# Patient Record
Sex: Female | Born: 1945 | Race: White | Hispanic: No | Smoking: Never smoker
Health system: Southern US, Community
[De-identification: ages and names within clinical notes are randomized; demographics above are authoritative.]

## PROBLEM LIST (undated history)

## (undated) DIAGNOSIS — R002 Palpitations: Principal | ICD-10-CM

## (undated) DIAGNOSIS — B029 Zoster without complications: Secondary | ICD-10-CM

## (undated) DIAGNOSIS — I499 Cardiac arrhythmia, unspecified: Secondary | ICD-10-CM

## (undated) DIAGNOSIS — E039 Hypothyroidism, unspecified: Secondary | ICD-10-CM

## (undated) DIAGNOSIS — A4902 Methicillin resistant Staphylococcus aureus infection, unspecified site: Secondary | ICD-10-CM

## (undated) DIAGNOSIS — F32A Depression, unspecified: Secondary | ICD-10-CM

## (undated) DIAGNOSIS — Q615 Medullary cystic kidney: Secondary | ICD-10-CM

## (undated) DIAGNOSIS — M8580 Other specified disorders of bone density and structure, unspecified site: Secondary | ICD-10-CM

## (undated) DIAGNOSIS — F419 Anxiety disorder, unspecified: Secondary | ICD-10-CM

## (undated) DIAGNOSIS — I4891 Unspecified atrial fibrillation: Secondary | ICD-10-CM

## (undated) DIAGNOSIS — F209 Schizophrenia, unspecified: Secondary | ICD-10-CM

## (undated) DIAGNOSIS — I059 Rheumatic mitral valve disease, unspecified: Secondary | ICD-10-CM

## (undated) DIAGNOSIS — I341 Nonrheumatic mitral (valve) prolapse: Secondary | ICD-10-CM

## (undated) DIAGNOSIS — J309 Allergic rhinitis, unspecified: Secondary | ICD-10-CM

## (undated) DIAGNOSIS — Z87442 Personal history of urinary calculi: Secondary | ICD-10-CM

## (undated) DIAGNOSIS — F329 Major depressive disorder, single episode, unspecified: Secondary | ICD-10-CM

## (undated) DIAGNOSIS — E119 Type 2 diabetes mellitus without complications: Secondary | ICD-10-CM

## (undated) DIAGNOSIS — N200 Calculus of kidney: Secondary | ICD-10-CM

## (undated) DIAGNOSIS — K8020 Calculus of gallbladder without cholecystitis without obstruction: Secondary | ICD-10-CM

## (undated) DIAGNOSIS — R011 Cardiac murmur, unspecified: Secondary | ICD-10-CM

## (undated) DIAGNOSIS — I10 Essential (primary) hypertension: Secondary | ICD-10-CM

## (undated) DIAGNOSIS — G709 Myoneural disorder, unspecified: Secondary | ICD-10-CM

## (undated) HISTORY — DX: Medullary cystic kidney: Q61.5

## (undated) HISTORY — DX: Unspecified atrial fibrillation: I48.91

## (undated) HISTORY — PX: NASAL SINUS SURGERY: SHX719

## (undated) HISTORY — DX: Zoster without complications: B02.9

## (undated) HISTORY — DX: Allergic rhinitis, unspecified: J30.9

## (undated) HISTORY — DX: Palpitations: R00.2

## (undated) HISTORY — DX: Other specified disorders of bone density and structure, unspecified site: M85.80

## (undated) HISTORY — DX: Type 2 diabetes mellitus without complications: E11.9

## (undated) HISTORY — DX: Calculus of gallbladder without cholecystitis without obstruction: K80.20

## (undated) HISTORY — PX: SHOULDER ARTHROSCOPY: SHX128

## (undated) HISTORY — PX: SHOULDER ARTHROSCOPY W/ ROTATOR CUFF REPAIR: SHX2400

## (undated) HISTORY — DX: Rheumatic mitral valve disease, unspecified: I05.9

## (undated) HISTORY — DX: Methicillin resistant Staphylococcus aureus infection, unspecified site: A49.02

## (undated) HISTORY — DX: Calculus of kidney: N20.0

## (undated) HISTORY — PX: ELBOW ARTHROSCOPY WITH TENDON RECONSTRUCTION: SHX5616

---

## 1989-01-06 HISTORY — PX: CYSTOSCOPY W/ STONE MANIPULATION: SHX1427

## 2002-05-08 HISTORY — PX: KNEE ARTHROSCOPY: SHX127

## 2005-05-08 HISTORY — PX: ELBOW ARTHROSCOPY: SHX614

## 2006-05-08 HISTORY — PX: ELBOW ARTHROSCOPY: SHX614

## 2007-06-26 ENCOUNTER — Other Ambulatory Visit: Admission: RE | Admit: 2007-06-26 | Discharge: 2007-06-26 | Payer: Self-pay | Admitting: Pediatrics

## 2007-07-25 ENCOUNTER — Encounter: Admission: RE | Admit: 2007-07-25 | Discharge: 2007-07-25 | Payer: Self-pay | Admitting: Family Medicine

## 2007-10-15 ENCOUNTER — Encounter: Admission: RE | Admit: 2007-10-15 | Discharge: 2007-10-15 | Payer: Self-pay | Admitting: Family Medicine

## 2007-10-16 ENCOUNTER — Encounter: Admission: RE | Admit: 2007-10-16 | Discharge: 2007-10-16 | Payer: Self-pay | Admitting: Family Medicine

## 2008-09-14 ENCOUNTER — Encounter: Admission: RE | Admit: 2008-09-14 | Discharge: 2008-09-14 | Payer: Self-pay | Admitting: Family Medicine

## 2009-02-05 HISTORY — PX: KNEE ARTHROSCOPY: SHX127

## 2009-02-24 ENCOUNTER — Ambulatory Visit (HOSPITAL_COMMUNITY): Admission: RE | Admit: 2009-02-24 | Discharge: 2009-02-25 | Payer: Self-pay | Admitting: Orthopedic Surgery

## 2009-02-24 ENCOUNTER — Encounter (INDEPENDENT_AMBULATORY_CARE_PROVIDER_SITE_OTHER): Payer: Self-pay | Admitting: Specialist

## 2009-09-28 ENCOUNTER — Encounter: Admission: RE | Admit: 2009-09-28 | Discharge: 2009-09-28 | Payer: Self-pay | Admitting: Family Medicine

## 2010-08-11 LAB — CBC
Hemoglobin: 15.5 g/dL — ABNORMAL HIGH (ref 12.0–15.0)
MCV: 91.7 fL (ref 78.0–100.0)
RDW: 13 % (ref 11.5–15.5)
WBC: 5 10*3/uL (ref 4.0–10.5)

## 2010-08-11 LAB — BASIC METABOLIC PANEL
BUN: 12 mg/dL (ref 6–23)
Calcium: 9.8 mg/dL (ref 8.4–10.5)
Creatinine, Ser: 0.64 mg/dL (ref 0.4–1.2)
GFR calc Af Amer: >60 mL/min (ref >60)
GFR calc non Af Amer: >60 mL/min (ref >60)
Glucose, Bld: 99 mg/dL (ref 70–99)
Potassium: 3.5 mEq/L (ref 3.5–5.1)

## 2010-10-17 ENCOUNTER — Other Ambulatory Visit: Payer: Self-pay | Admitting: Family Medicine

## 2010-10-17 DIAGNOSIS — Z1231 Encounter for screening mammogram for malignant neoplasm of breast: Secondary | ICD-10-CM

## 2010-10-26 ENCOUNTER — Ambulatory Visit
Admission: RE | Admit: 2010-10-26 | Discharge: 2010-10-26 | Disposition: A | Discharge status: Home / Self Care | Payer: 59 | Source: Ambulatory Visit | Attending: Family Medicine | Admitting: Family Medicine

## 2010-10-26 DIAGNOSIS — Z1231 Encounter for screening mammogram for malignant neoplasm of breast: Secondary | ICD-10-CM

## 2010-11-01 ENCOUNTER — Other Ambulatory Visit (HOSPITAL_COMMUNITY)
Admission: RE | Admit: 2010-11-01 | Discharge: 2010-11-01 | Disposition: A | Discharge status: Home / Self Care | Payer: 59 | Source: Ambulatory Visit | Attending: Family Medicine | Admitting: Family Medicine

## 2010-11-01 ENCOUNTER — Other Ambulatory Visit: Payer: Self-pay | Admitting: Physician Assistant

## 2010-11-01 DIAGNOSIS — Z01419 Encounter for gynecological examination (general) (routine) without abnormal findings: Secondary | ICD-10-CM | POA: Insufficient documentation

## 2011-06-28 ENCOUNTER — Other Ambulatory Visit: Payer: Self-pay | Admitting: Family Medicine

## 2011-06-28 ENCOUNTER — Ambulatory Visit
Admission: RE | Admit: 2011-06-28 | Discharge: 2011-06-28 | Disposition: A | Discharge status: Home / Self Care | Payer: PRIVATE HEALTH INSURANCE | Source: Ambulatory Visit | Attending: Family Medicine | Admitting: Family Medicine

## 2011-06-28 DIAGNOSIS — H9190 Unspecified hearing loss, unspecified ear: Secondary | ICD-10-CM

## 2011-06-28 MED ORDER — GADOBENATE DIMEGLUMINE 529 MG/ML IV SOLN
13.0000 mL | Freq: Once | INTRAVENOUS | Status: AC | PRN
  Administered 2011-06-28: 13 mL via INTRAVENOUS

## 2011-07-26 ENCOUNTER — Other Ambulatory Visit: Payer: Self-pay

## 2011-07-26 ENCOUNTER — Observation Stay (HOSPITAL_COMMUNITY)
Admission: EM | Admit: 2011-07-26 | Discharge: 2011-07-28 | Disposition: A | Discharge status: Home / Self Care | Payer: PRIVATE HEALTH INSURANCE | Attending: Emergency Medicine | Admitting: Emergency Medicine

## 2011-07-26 ENCOUNTER — Emergency Department (HOSPITAL_COMMUNITY): Payer: PRIVATE HEALTH INSURANCE

## 2011-07-26 ENCOUNTER — Encounter (HOSPITAL_COMMUNITY): Payer: Self-pay | Admitting: Emergency Medicine

## 2011-07-26 DIAGNOSIS — R4182 Altered mental status, unspecified: Secondary | ICD-10-CM | POA: Insufficient documentation

## 2011-07-26 DIAGNOSIS — F22 Delusional disorders: Secondary | ICD-10-CM

## 2011-07-26 DIAGNOSIS — I059 Rheumatic mitral valve disease, unspecified: Secondary | ICD-10-CM | POA: Insufficient documentation

## 2011-07-26 DIAGNOSIS — F411 Generalized anxiety disorder: Secondary | ICD-10-CM | POA: Insufficient documentation

## 2011-07-26 DIAGNOSIS — F419 Anxiety disorder, unspecified: Secondary | ICD-10-CM

## 2011-07-26 DIAGNOSIS — R079 Chest pain, unspecified: Principal | ICD-10-CM | POA: Insufficient documentation

## 2011-07-26 DIAGNOSIS — R0602 Shortness of breath: Secondary | ICD-10-CM | POA: Insufficient documentation

## 2011-07-26 HISTORY — DX: Nonrheumatic mitral (valve) prolapse: I34.1

## 2011-07-26 HISTORY — DX: Anxiety disorder, unspecified: F41.9

## 2011-07-26 LAB — POCT I-STAT TROPONIN I: Troponin i, poc: 0.01 ng/mL (ref 0.00–0.08)

## 2011-07-26 LAB — COMPREHENSIVE METABOLIC PANEL
ALT: 26 U/L (ref 0–35)
Alkaline Phosphatase: 84 U/L (ref 39–117)
CO2: 29 mEq/L (ref 19–32)
Chloride: 96 mEq/L (ref 96–112)
Creatinine, Ser: 0.68 mg/dL (ref 0.50–1.10)
Potassium: 3.2 mEq/L — ABNORMAL LOW (ref 3.5–5.1)
Total Bilirubin: 0.5 mg/dL (ref 0.3–1.2)

## 2011-07-26 LAB — CBC
MCH: 31.6 pg (ref 26.0–34.0)
MCV: 87 fL (ref 78.0–100.0)
RDW: 12.6 % (ref 11.5–15.5)

## 2011-07-26 MED ORDER — ALPRAZOLAM 0.5 MG PO TABS
0.2500 mg | ORAL_TABLET | Freq: Every day | ORAL | Status: DC
  Administered 2011-07-27: 0.25 mg via ORAL
  Filled 2011-07-26: qty 2
  Filled 2011-07-26: qty 1

## 2011-07-26 MED ORDER — PROPRANOLOL HCL ER 80 MG PO CP24
80.0000 mg | ORAL_CAPSULE | ORAL | Status: AC
  Administered 2011-07-27: 80 mg via ORAL
  Filled 2011-07-26: qty 1

## 2011-07-26 MED ORDER — ALPRAZOLAM 0.5 MG PO TABS: 0.2500 mg | ORAL_TABLET | Freq: Once | ORAL | Status: DC

## 2011-07-26 NOTE — ED Provider Notes (Signed)
History     CSN: [id number]  Arrival date & time 07/26/11  1909   First MD Initiated Contact with Patient 07/26/11 2027      Chief Complaint  Patient presents with  . Chest Pain    (Consider location/radiation/quality/duration/timing/severity/associated sxs/prior treatment) HPI Pt with cp onset last night, ss pressure-like, nonradiating, last a few minutes, no aggravating or alleviating factors, some mild associated sob.  Has never had cp like this in past.    Past Medical History  Diagnosis Date  . Mitral valve prolapse   . Anxiety     No past surgical history on file.  No family history on file.  History  Substance Use Topics  . Smoking status: Not on file  . Smokeless tobacco: Not on file  . Alcohol Use:     OB History    Grav Para Term Preterm Abortions TAB SAB Ect Mult Living                  Review of Systems  Constitutional: Negative for fever and chills.  Respiratory: Positive for shortness of breath. Negative for cough.   Cardiovascular: Positive for chest pain.  Gastrointestinal: Negative for nausea and vomiting.  Musculoskeletal: Negative for back pain.  All other systems reviewed and are negative.    Allergies  Sulfa antibiotics  Home Medications   Current Outpatient Rx  Name Route Sig Dispense Refill  . ALPRAZOLAM 0.25 MG PO TABS Oral Take 0.25 mg by mouth at bedtime as needed. For insomnia/anxiety    . ASPIRIN EC 81 MG PO TBEC Oral Take 81 mg by mouth daily.    Marland Kitchen DIGOXIN 0.25 MG PO TABS Oral Take 0.25-0.5 mg by mouth daily. Take one tablet daily alternate with one half tab    . ESCITALOPRAM OXALATE 5 MG PO TABS Oral Take 10 mg by mouth daily.    Marland Kitchen PROPRANOLOL HCL 40 MG PO TABS Oral Take 40 mg by mouth 4 (four) times daily.    . TRIAMTERENE-HCTZ 37.5-25 MG PO CAPS Oral Take 1 capsule by mouth every morning.      BP 128/43  Pulse 67  Temp(Src) 98.6 F (37 C) (Oral)  Resp 19  SpO2 100%  Physical Exam  Nursing note and vitals  reviewed. Constitutional: She appears well-developed and well-nourished.  HENT:  Head: Normocephalic and atraumatic.  Eyes: Right eye exhibits no discharge. Left eye exhibits no discharge.  Neck: Normal range of motion. Neck supple.  Cardiovascular: Normal rate, regular rhythm and normal heart sounds.   Pulmonary/Chest: Effort normal and breath sounds normal. She exhibits no tenderness.  Abdominal: Soft. There is no tenderness.  Musculoskeletal: She exhibits no edema and no tenderness.  Neurological: She is alert. GCS eye subscore is 4. GCS verbal subscore is 5. GCS motor subscore is 6.  Skin: Skin is warm and dry.  Psychiatric: She has a normal mood and affect. Her behavior is normal.    ED Course  Procedures (including critical care time)  Labs Reviewed  CBC - Abnormal; Notable for the following:    RBC 5.31 (*)    Hemoglobin 16.8 (*)    HCT 46.2 (*)    MCHC 36.4 (*)    All other components within normal limits  COMPREHENSIVE METABOLIC PANEL - Abnormal; Notable for the following:    Potassium 3.2 (*)    Glucose, Bld 120 (*)    All other components within normal limits  POCT I-STAT TROPONIN I  POCT I-STAT TROPONIN I  Dg Chest 2 View  07/26/2011  *RADIOLOGY REPORT*  Clinical Data: Chest pain, shortness of breath  CHEST - 2 VIEW  Comparison: 02/22/2009  Findings: Normal heart size, mediastinal contours, and pulmonary vascularity. Lungs hyperexpanded but clear. No pleural effusion or pneumothorax. Bones diffusely demineralized without acute abnormality.  IMPRESSION: Hyperexpanded lungs without acute infiltrate.  Original Report Authenticated By: Lollie Marrow, M.D.     1. Chest pain      EKG: unchanged from previous tracings, normal sinus rhythm, nonspecific ST and T waves changes.  MDM  Pt is in nad, afvss, nontoxic appearing, exam and hx as above. acs considered, timi score 1 for multiple episodes, no h/o cad or risk factors, trop wnl, ekg nl, pt placed in cdu for low risk  obs with stress test.  Wells score is 0 doubt PE, doubt dissection, doubt esophageal injury. No infectious sx's, no opacity on cxr, doubt pna.  No masses or ptx on cxr.           Elijio Miles, MD 07/26/11 2325  Elijio Miles, MD 07/26/11 [phone]

## 2011-07-26 NOTE — ED Notes (Signed)
Per ems- pt coming from church where she was having chest pressure on right side to sternum 6/10 with shortness of breath.  Pt had 324 asa pta.  Pt states pain is now 4/10.  No nausea or vomiting. Ems state pt stopped taking antidepressant as of today

## 2011-07-26 NOTE — ED Notes (Signed)
EKG GIVEN TO DR KNAPP

## 2011-07-26 NOTE — ED Notes (Signed)
Unable to move pt to CDU b/c charge rn may close cdu.  Await status update.

## 2011-07-26 NOTE — ED Notes (Signed)
Pt denies pain but states that her heart is pounding.  HR  Of 68 nsr.  Pt notified of this.  Family pulled me aside and stated that has been having psychological "problems" for 2 months.  She is seeing a psychiatrist and psychologist (she recently had mri as well).  Per family, all other med labs/tests have been neg.  She has been hearing things "happening" with her neighbors up and down stairs that no one else in the family has heard.  She thinks they are saying to turn off the light, molesting their children, etc. She acts on these conversations, turning off the lights and calling 911.  Per husband, wife has also been hypersexual.  Dr Jamas Lav notified that family is concerned for pt mental health.

## 2011-07-26 NOTE — ED Provider Notes (Signed)
Patient relates she's been having a chest discomfort off and on since yesterday. She states it lasts about 30 seconds and it is a burning sensation she points to an area around the xiphoid upper epigastric region as where the pain originates. She denies any radiation up into her throat. She states when she has it she feels like her heart is pounding it makes her feel short of breath. She states that she standing it feels better she sits down but it can also happen if she sits down. Nothing she does makes the pain come on.   Patient has a regular heartbeat when I listen to her, there is no murmurs gallops or rubs. She has some tenderness at the xiphoid which is where she states her pain is located.     I saw and evaluated the patient, reviewed the resident's note and I agree with the findings and plan and EKG interpretation. Devoria Albe, MD, Armando Gang   Ward Givens, MD 07/28/11 [phone]

## 2011-07-27 ENCOUNTER — Encounter (HOSPITAL_COMMUNITY): Payer: Self-pay | Admitting: *Deleted

## 2011-07-27 ENCOUNTER — Other Ambulatory Visit: Payer: Self-pay

## 2011-07-27 ENCOUNTER — Observation Stay (HOSPITAL_COMMUNITY): Payer: PRIVATE HEALTH INSURANCE

## 2011-07-27 LAB — URINE MICROSCOPIC-ADD ON

## 2011-07-27 LAB — RAPID URINE DRUG SCREEN, HOSP PERFORMED
Barbiturates: NOT DETECTED
Benzodiazepines: NOT DETECTED
Cocaine: NOT DETECTED
Opiates: NOT DETECTED

## 2011-07-27 LAB — URINALYSIS, ROUTINE W REFLEX MICROSCOPIC
Bilirubin Urine: NEGATIVE
Glucose, UA: NEGATIVE mg/dL
Ketones, ur: >80 mg/dL — AB
Nitrite: NEGATIVE
Specific Gravity, Urine: 1.021 (ref 1.005–1.030)
pH: 6.5 (ref 5.0–8.0)

## 2011-07-27 LAB — POCT I-STAT TROPONIN I: Troponin i, poc: 0.01 ng/mL (ref 0.00–0.08)

## 2011-07-27 MED ORDER — LORAZEPAM 1 MG PO TABS: 1.0000 mg | ORAL_TABLET | Freq: Three times a day (TID) | ORAL | Status: DC | PRN

## 2011-07-27 MED ORDER — ONDANSETRON HCL 8 MG PO TABS: 4.0000 mg | ORAL_TABLET | Freq: Three times a day (TID) | ORAL | Status: DC | PRN

## 2011-07-27 MED ORDER — ZOLPIDEM TARTRATE 5 MG PO TABS
5.0000 mg | ORAL_TABLET | Freq: Every evening | ORAL | Status: DC | PRN
  Filled 2011-07-27: qty 1

## 2011-07-27 MED ORDER — ACETAMINOPHEN 325 MG PO TABS: 650.0000 mg | ORAL_TABLET | ORAL | Status: DC | PRN

## 2011-07-27 MED ORDER — NITROGLYCERIN 0.4 MG SL SUBL
0.4000 mg | SUBLINGUAL_TABLET | Freq: Once | SUBLINGUAL | Status: AC
  Administered 2011-07-27: 0.4 mg via SUBLINGUAL

## 2011-07-27 MED ORDER — TRIAMTERENE-HCTZ 37.5-25 MG PO CAPS
1.0000 | ORAL_CAPSULE | Freq: Every day | ORAL | Status: DC
  Administered 2011-07-28: 1 via ORAL
  Filled 2011-07-27: qty 1

## 2011-07-27 MED ORDER — ESCITALOPRAM OXALATE 10 MG PO TABS
10.0000 mg | ORAL_TABLET | Freq: Every day | ORAL | Status: DC
  Administered 2011-07-27 – 2011-07-28 (×2): 10 mg via ORAL
  Filled 2011-07-27 (×2): qty 1

## 2011-07-27 MED ORDER — LORAZEPAM 2 MG/ML IJ SOLN
1.0000 mg | Freq: Once | INTRAMUSCULAR | Status: AC
  Administered 2011-07-27: 1 mg via INTRAVENOUS

## 2011-07-27 MED ORDER — ASPIRIN EC 81 MG PO TBEC
81.0000 mg | DELAYED_RELEASE_TABLET | Freq: Every day | ORAL | Status: DC
  Administered 2011-07-27 – 2011-07-28 (×2): 81 mg via ORAL
  Filled 2011-07-27: qty 1

## 2011-07-27 MED ORDER — PROPRANOLOL HCL 40 MG PO TABS
40.0000 mg | ORAL_TABLET | Freq: Four times a day (QID) | ORAL | Status: DC
  Administered 2011-07-27 – 2011-07-28 (×3): 40 mg via ORAL
  Filled 2011-07-27 (×3): qty 1

## 2011-07-27 MED ORDER — DIGOXIN 125 MCG PO TABS
0.2500 mg | ORAL_TABLET | Freq: Every day | ORAL | Status: DC
  Administered 2011-07-27 – 2011-07-28 (×2): 0.25 mg via ORAL
  Filled 2011-07-27: qty 2
  Filled 2011-07-27: qty 1

## 2011-07-27 MED ORDER — IBUPROFEN 200 MG PO TABS: 600.0000 mg | ORAL_TABLET | Freq: Three times a day (TID) | ORAL | Status: DC | PRN

## 2011-07-27 MED ORDER — LORAZEPAM 2 MG/ML IJ SOLN
INTRAMUSCULAR | Status: AC
  Filled 2011-07-27: qty 1

## 2011-07-27 MED ORDER — IOHEXOL 350 MG/ML SOLN
80.0000 mL | Freq: Once | INTRAVENOUS | Status: AC | PRN
  Administered 2011-07-27: 80 mL via INTRAVENOUS

## 2011-07-27 MED ORDER — METOPROLOL TARTRATE 25 MG PO TABS
50.0000 mg | ORAL_TABLET | Freq: Once | ORAL | Status: AC
  Administered 2011-07-27: 50 mg via ORAL
  Filled 2011-07-27: qty 2

## 2011-07-27 MED ORDER — METOPROLOL TARTRATE 1 MG/ML IV SOLN
5.0000 mg | Freq: Once | INTRAVENOUS | Status: AC
  Administered 2011-07-27: 5 mg via INTRAVENOUS

## 2011-07-27 NOTE — BH Assessment (Signed)
Assessment Note   Deanna Shea is an 66 y.o. female that was referred to [facility] by her husband due to bizarre behavior.  Pt's spouse reported that since January, pt has been reporting that she could hear her upstairs neighbor through a vent and that he (the neighbor) was raping and molest his daughter.  Pt called DSS, and this was denied by the neighbor.  Pt also engaging in hypereligiousity, stating God is telling her things of this nature as well as 7 voices in her head or a "group of them tell me this," per the pt.  Pt and spouse then changed apartments within the same complex hoping this would rectify the problem, and pt most recently has reported that the downstairs neighbors are connected with the previous neighbor and want to shoot her.  Pt has also reported having meetings set up with the neighbors and apartment complex manager that did not exist.  Pt also reportedly believes a bug has been set up in her apartment and that these neighbors are listening to and plotting against her.  Pt's spouse has been so distraught by pt's current behavior, that he has offered to go to a hotel to get away from the apartment.  Pt reportedly has not been sleeping more than 3 hrs per night and has lost 15 lbs since January 2013.  Pt has also reported having anxiety, thought she was having irregular heart beat and went to Urgent Care, where all of her scans and labs are normal.  Pt has no previous mental health history and pt reports that this behavior is out of character for her and that he is worried.  Pt began seeing a psychiatrist and psychologist earlier this month and has been prescribed Xanax for anxiety as well as an unknown antidepressant and sleeping medication.  Pt denies SI/HI or visual hallucinations.  She does state that the voices tell her to do things and "have [place] running around."  Pt was drowsy during the session, but was pleasant and cooperative.  Pt is motivated for treatment.  Completed assessment,  assessment notification and faxed to [facility] to run for possible admission.  Updated ED staff.  Axis I: Psychotic Disorder NOS Axis II: Deferred Axis III:  Past Medical History  Diagnosis Date  . Mitral valve prolapse   . Anxiety    Axis IV: denies current stressors Axis V: 21-30 behavior considerably influenced by delusions or hallucinations OR serious impairment in judgment, communication OR inability to function in almost all areas  Past Medical History:  Past Medical History  Diagnosis Date  . Mitral valve prolapse   . Anxiety     History reviewed. No pertinent past surgical history.  Family History: No family history on file.  Social History:  does not have a smoking history on file. She does not have any smokeless tobacco history on file. She reports that she does not drink alcohol or use illicit drugs.  Additional Social History:  Alcohol / Drug Use Pain Medications: none Prescriptions: see list Over the Counter: none History of alcohol / drug use?: No history of alcohol / drug abuse Longest period of sobriety (when/how long): na Negative Consequences of Use:  (na) Withdrawal Symptoms:  (na) Allergies:  Allergies  Allergen Reactions  . Sulfa Antibiotics Other (See Comments)    Sores in mouth    Home Medications:  Medications Prior to Admission  Medication Dose Route Frequency Provider Last Rate Last Dose  . acetaminophen (TYLENOL) tablet 650 mg  650  mg Oral Q4H PRN Rise Patience, PA      . ALPRAZolam Prudy Feeler) tablet 0.25 mg  0.25 mg Oral QHS Elijio Miles, MD      . ibuprofen (ADVIL,MOTRIN) tablet 600 mg  600 mg Oral Q8H PRN Rise Patience, PA      . iohexol (OMNIPAQUE) 350 MG/ML injection 80 mL  80 mL Intravenous Once PRN Medication Radiologist, MD   80 mL at 07/27/11 1120  . LORazepam (ATIVAN) injection 1 mg  1 mg Intravenous Once Rise Patience, PA   1 mg at 07/27/11 4540  . LORazepam (ATIVAN) tablet 1 mg  1 mg Oral Q8H PRN Rise Patience, PA      . metoprolol  (LOPRESSOR) injection 5 mg  5 mg Intravenous Once Trudie Reed, MD   5 mg at 07/27/11 1043  . metoprolol tartrate (LOPRESSOR) tablet 50 mg  50 mg Oral Once Rise Patience, PA   50 mg at 07/27/11 9811  . nitroGLYCERIN (NITROSTAT) SL tablet 0.4 mg  0.4 mg Sublingual Once Trudie Reed, MD   0.4 mg at 07/27/11 1105  . ondansetron (ZOFRAN) tablet 4 mg  4 mg Oral Q8H PRN Rise Patience, PA      . propranolol (INDERAL LA) 24 hr capsule 80 mg  80 mg Oral To Major Elijio Miles, MD   80 mg at 07/27/11 0007  . zolpidem (AMBIEN) tablet 5 mg  5 mg Oral QHS PRN Rise Patience, PA      . DISCONTD: ALPRAZolam Prudy Feeler) tablet 0.25 mg  0.25 mg Oral Once Elijio Miles, MD       No current outpatient prescriptions on file as of 07/26/2011.    OB/GYN Status:  No LMP recorded. Patient is postmenopausal.  General Assessment Data Location of Assessment: [facility] ED Living Arrangements: Spouse/significant other Can pt return to current living arrangement?: Yes Admission Status: Voluntary Is patient capable of signing voluntary admission?: Yes Transfer from: Acute Hospital Referral Source: Self/Family/Friend  Education Status Is patient currently in school?: No Current Grade: na Highest grade of school patient has completed: unknown Name of school: na Contact person: na  Risk to self Suicidal Ideation: No-Not Currently/Within Last 6 Months Suicidal Intent: No-Not Currently/Within Last 6 Months Is patient at risk for suicide?: No Suicidal Plan?: No-Not Currently/Within Last 6 Months Access to Means: No What has been your use of drugs/alcohol within the last 12 months?: na Previous Attempts/Gestures: No How many times?: 0  Other Self Harm Risks: pt denies Triggers for Past Attempts: Other (Comment) (na) Intentional Self Injurious Behavior: None Family Suicide History: No Recent stressful life event(s): Other (Comment) (pt denies current stressors) Persecutory voices/beliefs?: No Depression: Yes Depression  Symptoms: Despondent;Insomnia;Fatigue Substance abuse history and/or treatment for substance abuse?: No Suicide prevention information given to non-admitted patients: Not applicable  Risk to Others Homicidal Ideation: No-Not Currently/Within Last 6 Months Thoughts of Harm to Others: No-Not Currently Present/Within Last 6 Months Current Homicidal Intent: No-Not Currently/Within Last 6 Months Current Homicidal Plan: No-Not Currently/Within Last 6 Months Access to Homicidal Means: No Identified Victim: na History of harm to others?: No Assessment of Violence: None Noted Violent Behavior Description: na - pt calm, cooperative Does patient have access to weapons?: No Criminal Charges Pending?: No Does patient have a court date: No  Psychosis Hallucinations: Auditory;With command (pt hears voices telling her to do things) Delusions:  (paranoid delusions, hyperreligiousity)  Mental Status Report Appear/Hygiene: Other (Comment) (casual, neat) Eye Contact: Poor Motor Activity: Unremarkable  Speech: Logical/coherent;Soft Level of Consciousness: Quiet/awake Mood: Depressed;Anxious Affect: Depressed;Anxious Anxiety Level: Panic Attacks Panic attack frequency: varies Most recent panic attack: Monday of this week Thought Processes: Coherent Judgement: Impaired Orientation: Person;Place;Time;Situation Obsessive Compulsive Thoughts/Behaviors: None  Cognitive Functioning Concentration: Decreased Memory: Recent Intact;Remote Intact IQ: Average Insight: Poor Impulse Control: Fair Appetite: Poor Weight Loss: 15  (since January 2013) Weight Gain: 0  Sleep: Decreased Total Hours of Sleep: 3  Vegetative Symptoms: None  Prior Inpatient Therapy Prior Inpatient Therapy: No Prior Therapy Dates: na Prior Therapy Facilty/Provider(s): na Reason for Treatment: na  Prior Outpatient Therapy Prior Outpatient Therapy: Yes Prior Therapy Dates: March 2012 - current Prior Therapy  Facilty/Provider(s): Fredric Mare - psychiatrist, Colen Darling - psychologist Reason for Treatment: Depression, anxiety  ADL Screening (condition at time of admission) Patient's cognitive ability adequate to safely complete daily activities?: Yes Patient able to express need for assistance with ADLs?: Yes Independently performs ADLs?: Yes  Home Assistive Devices/Equipment Home Assistive Devices/Equipment: None    Abuse/Neglect Assessment (Assessment to be complete while patient is alone) Physical Abuse: Denies Verbal Abuse: Denies Sexual Abuse: Denies Exploitation of patient/patient's resources: Denies Self-Neglect: Denies Values / Beliefs Cultural Requests During Hospitalization: None Spiritual Requests During Hospitalization: None Consults Spiritual Care Consult Needed: No Social Work Consult Needed: No Merchant navy officer (For Healthcare) Advance Directive: Patient does not have advance directive;Patient would not like information    Additional Information 1:1 In Past 12 Months?: No CIRT Risk: No Elopement Risk: No Does patient have medical clearance?: Yes     Disposition:  Disposition Disposition of Patient: Referred to;Inpatient treatment program Type of inpatient treatment program: Adult Patient referred to: Other (Comment)  On Site Evaluation by:   Reviewed with Physician:     Caryl Comes 07/27/2011 2:37 PM

## 2011-07-27 NOTE — ED Notes (Signed)
Returned from ct 

## 2011-07-27 NOTE — ED Provider Notes (Signed)
Date: 07/27/2011  Rate: 60  Rhythm: normal sinus rhythm  QRS Axis: normal  Intervals: PR prolonged  ST/T Wave abnormalities: normal  Conduction Disutrbances:first-degree A-V block   Narrative Interpretation:   Old EKG Reviewed: unchanged    Dayton Bailiff, MD 07/27/11 [phone]

## 2011-07-27 NOTE — ED Notes (Signed)
Pt now states anxiety is passing. Declined medication.

## 2011-07-27 NOTE — ED Notes (Signed)
Pt denies auditory or visual disturbances. Denies anxiety. Denies chest pressure or pain. Flat affect remains.

## 2011-07-27 NOTE — ED Provider Notes (Signed)
Patient in CDU awaiting completion of ACT evaluation and placement.  Patient originally on chest pain protocol--completed protocol without significant findings.  Patient with reported episodes of delusion and paranoia.  Patient currently resting comfortably with family at bedside.  States she does not feel anxious.  Patient alert and oriented.  Lungs CTA bilaterally.  S1/S2, RRR, no murmur. Abdomen soft, bowel sounds present.  Patient has been seen by ACT, and they are working on placement location.  12:19 AM Patient still awaiting placement.  Report provided to Dr. Lynelle Doctor.  Jimmye Norman, NP 07/28/11 0020

## 2011-07-27 NOTE — ED Notes (Signed)
Pt responsive to ativan. States chest pressure significantly decreased. Denies sob. Pt to now have ct coronary.

## 2011-07-27 NOTE — ED Notes (Signed)
Pt transported to ct for coronary scan 

## 2011-07-27 NOTE — ED Notes (Signed)
Diet tray ordered for pt 

## 2011-07-27 NOTE — ED Provider Notes (Signed)
7:59 AM Patient is in CDU under observation, chest pain, protocol.  Patient reports she continues to have 8/10 chest pressure.  Husband notes that patient has been having difficulties over the past several weeks of a psychiatric nature.  States that initially patient thought she was hearing voices of the apartment above and was convinced that the parent was molesting a child, then called social services who cleared up the matter.  Patient then began to believe that the family and the apartment was "out to get her" husband states that he and the patient then moved apartments and patient began believing that people in the apartment below her were knew the people from the other apartment and were out to get her as well.  Husband states that this all became much worse four days ago - states that the patient began having rapid heartbeat and chest pressure and feeling as if the people in the apartment for "coming to get her."  He took her to a motel for one night, which made the patient feel better.  The patient, he states, was confused and indecisive and has also been more emotional recently about both her religion and her relationship with husband.  Patient has been seeing a psychiatrist, who has given her antidepressant and antianxiety medication, as well as a sleeping pill.  Patient states she stopped taking the antidepressant yesterday because it makes her feel "out of it."  Patient states she feels fine currently and whenever she is out of the apartment.   On exam, pt is A&Ox4, NAD, flat affect, RRR, no m/r/g, CTAB, abd soft, mild diffuse tenderness to palpation, no guarding, no rebound, extremities without edema, distal pulses intact and equal bilaterally.   I spoke with patient and her husband about psychiatric admission following medical clearance, which they both agree with.    8:15 AM I have discussed the patient with Dr Radford Pax.  Given patient's current 8/10 chest pressure and patient being given propanolol  last night, will discontinue order for exercise stress test.  Nurse Alycia Rossetti) to attempt 18 gauge IV and calculate BMI, attempt to do coronary CT this morning.    8:51 AM Patient with great improvement of chest pressure following ativan.  Patient now scheduled for coronary CT.    11:37 AM Call received, patient with calcium score of 0 on CT, no CAD, incidental finding of RCA.  I have discussed the results with patient and her spouse.  Spouse notes that he is interested in pursuing psychiatric admission for further care of patient, as we previously discussed.  I have paged the ACT team.    11:48 AM I spoke with Belenda Cruise of ACT team who will see patient in CDU.     4:02 PM Discussed patient with Felicie Morn, NP, who assumes care of patient at change of shift.  Dr Preston Fleeting (blue) is also aware of the patient.  Patient is pending placement by ACT team.     Rise Patience, PA 07/27/11 1608   10:36 AM Assumed care of patient again in CDU, AM shift, 07/28/11.  Patient has been accepted at inpatient psychiatric facility at [facility].  Patient to be d/c and transported by private vehicle this morning.    10:50 AM Patient and spouse doing well this morning, they are happy with the plan.  Patient states she slept well last night. They will be able to transport themselves to [place] if given directions.    Patient d/c home for transport by personal vehicle to [facility]  for inpatient psychiatric treatment.  Patient verbalizes understanding and agrees with plan.    Dillard Cannon [place], [place] 07/28/11 [phone]

## 2011-07-27 NOTE — ED Notes (Signed)
Beatriz Stallion, ACT in to talk with patient and family now.

## 2011-07-27 NOTE — ED Notes (Signed)
bmi 21.6

## 2011-07-27 NOTE — ED Notes (Signed)
Act team at bedside 

## 2011-07-27 NOTE — ED Notes (Signed)
Pt denies pain or pressure to L chest at this time.  States hx of anxiety.  Pt still has gall bladder.

## 2011-07-27 NOTE — ED Notes (Signed)
Additional family members at bedside. Pt seems more engaged with those present and flat affect has diminished.

## 2011-07-27 NOTE — ED Notes (Signed)
Pt has place specific delusions. Pt does not hear voices while outside of the home. ACT team currently working on placement. Pt remains with flat affect.

## 2011-07-27 NOTE — ED Notes (Signed)
Reg diet tray ordered 

## 2011-07-27 NOTE — ED Notes (Addendum)
States feeling a little anxious. Anti anxiety medication to be given per prn orders.

## 2011-07-28 LAB — URINE CULTURE
Colony Count: 85000
Culture  Setup Time: 201303211033

## 2011-07-28 LAB — POTASSIUM: Potassium: 3.2 mEq/L — ABNORMAL LOW (ref 3.5–5.1)

## 2011-07-28 MED ORDER — POTASSIUM CHLORIDE CRYS ER 20 MEQ PO TBCR
40.0000 meq | EXTENDED_RELEASE_TABLET | Freq: Once | ORAL | Status: AC
  Administered 2011-07-28: 40 meq via ORAL
  Filled 2011-07-28: qty 2

## 2011-07-28 NOTE — ED Notes (Signed)
Report Given to [employer] at [facility]

## 2011-07-28 NOTE — ED Notes (Signed)
Reported off and care handed over.

## 2011-07-28 NOTE — ED Notes (Signed)
Breakfast ordered 

## 2011-07-28 NOTE — Discharge Instructions (Signed)
Please go directly to the [facility] facility as instructed.  They are anticipating your arrival.  You may return to the ER at any time for worsening condition or any new symptoms that concern you.   Anxiety and Panic Attacks Your caregiver has informed you that you are having an anxiety or panic attack. There may be many forms of this. Most of the time these attacks come suddenly and without warning. They come at any time of day, including periods of sleep, and at any time of life. They may be strong and unexplained. Although panic attacks are very scary, they are physically harmless. Sometimes the cause of your anxiety is not known. Anxiety is a protective mechanism of the body in its fight or flight mechanism. Most of these perceived danger situations are actually nonphysical situations (such as anxiety over losing a job). CAUSES  The causes of an anxiety or panic attack are many. Panic attacks may occur in otherwise healthy people given a certain set of circumstances. There may be a genetic cause for panic attacks. Some medications may also have anxiety as a side effect. SYMPTOMS  Some of the most common feelings are:  Intense terror.   Dizziness, feeling faint.   Hot and cold flashes.   Fear of going crazy.   Feelings that nothing is real.   Sweating.   Shaking.   Chest pain or a fast heartbeat (palpitations).   Smothering, choking sensations.   Feelings of impending doom and that death is near.   Tingling of extremities, this may be from over-breathing.   Altered reality (derealization).   Being detached from yourself (depersonalization).  Several symptoms can be present to make up anxiety or panic attacks. DIAGNOSIS  The evaluation by your caregiver will depend on the type of symptoms you are experiencing. The diagnosis of anxiety or panic attack is made when no physical illness can be determined to be a cause of the symptoms. TREATMENT  Treatment to prevent anxiety and  panic attacks may include:  Avoidance of circumstances that cause anxiety.   Reassurance and relaxation.   Regular exercise.   Relaxation therapies, such as yoga.   Psychotherapy with a psychiatrist or therapist.   Avoidance of caffeine, alcohol and illegal drugs.   Prescribed medication.  SEEK IMMEDIATE MEDICAL CARE IF:   You experience panic attack symptoms that are different than your usual symptoms.   You have any worsening or concerning symptoms.  Document Released: 04/24/2005 Document Revised: 04/13/2011 Document Reviewed: 08/26/2009 [facility] Patient Information 2012 [place], [place].

## 2011-07-28 NOTE — ED Notes (Signed)
Pt accepted at [facility]. To be taken POV this afternoon. Plan of care given to family and they are compliant with such. Pt in nad. Denies discomfort and is exhibiting significant improvement from yesterday.

## 2011-07-28 NOTE — Progress Notes (Signed)
Medical observation review is complete.

## 2011-07-28 NOTE — ED Provider Notes (Signed)
See prior note   Ward Givens, MD 07/28/11 2348

## 2011-08-02 NOTE — ED Provider Notes (Signed)
See CDU documentation. Mr. Katrinka Blazing was involved in patient care only for his CDU care.  Dione Booze, MD 08/02/11 [phone]

## 2011-11-13 ENCOUNTER — Other Ambulatory Visit: Payer: Self-pay | Admitting: Family Medicine

## 2011-11-13 DIAGNOSIS — Z1231 Encounter for screening mammogram for malignant neoplasm of breast: Secondary | ICD-10-CM

## 2011-12-01 ENCOUNTER — Ambulatory Visit
Admission: RE | Admit: 2011-12-01 | Discharge: 2011-12-01 | Disposition: A | Discharge status: Home / Self Care | Payer: PRIVATE HEALTH INSURANCE | Source: Ambulatory Visit | Attending: Family Medicine | Admitting: Family Medicine

## 2011-12-01 DIAGNOSIS — Z1231 Encounter for screening mammogram for malignant neoplasm of breast: Secondary | ICD-10-CM

## 2012-08-29 ENCOUNTER — Other Ambulatory Visit: Payer: Self-pay | Admitting: Family Medicine

## 2012-08-29 ENCOUNTER — Other Ambulatory Visit (HOSPITAL_COMMUNITY)
Admission: RE | Admit: 2012-08-29 | Discharge: 2012-08-29 | Disposition: A | Discharge status: Home / Self Care | Payer: Medicare PPO | Source: Ambulatory Visit | Attending: Family Medicine | Admitting: Family Medicine

## 2012-08-29 DIAGNOSIS — Z124 Encounter for screening for malignant neoplasm of cervix: Secondary | ICD-10-CM | POA: Insufficient documentation

## 2012-09-26 ENCOUNTER — Other Ambulatory Visit: Payer: Self-pay

## 2012-12-16 ENCOUNTER — Other Ambulatory Visit: Payer: Self-pay

## 2012-12-16 DIAGNOSIS — Z1231 Encounter for screening mammogram for malignant neoplasm of breast: Secondary | ICD-10-CM

## 2012-12-30 ENCOUNTER — Ambulatory Visit: Payer: PRIVATE HEALTH INSURANCE

## 2013-01-08 ENCOUNTER — Ambulatory Visit
Admission: RE | Admit: 2013-01-08 | Discharge: 2013-01-08 | Disposition: A | Discharge status: Home / Self Care | Payer: Medicare PPO | Source: Ambulatory Visit

## 2013-01-08 DIAGNOSIS — Z1231 Encounter for screening mammogram for malignant neoplasm of breast: Secondary | ICD-10-CM

## 2013-05-16 ENCOUNTER — Other Ambulatory Visit: Payer: Self-pay | Admitting: Family Medicine

## 2013-05-16 DIAGNOSIS — M8580 Other specified disorders of bone density and structure, unspecified site: Secondary | ICD-10-CM

## 2013-06-03 ENCOUNTER — Ambulatory Visit
Admission: RE | Admit: 2013-06-03 | Discharge: 2013-06-03 | Disposition: A | Discharge status: Home / Self Care | Payer: Commercial Managed Care - HMO | Source: Ambulatory Visit | Attending: Family Medicine | Admitting: Family Medicine

## 2013-06-03 DIAGNOSIS — M8580 Other specified disorders of bone density and structure, unspecified site: Secondary | ICD-10-CM

## 2013-07-24 ENCOUNTER — Encounter: Payer: Self-pay | Admitting: Interventional Cardiology

## 2013-09-09 ENCOUNTER — Ambulatory Visit: Payer: Medicare PPO | Admitting: Interventional Cardiology

## 2013-10-01 ENCOUNTER — Ambulatory Visit: Payer: Medicare PPO | Admitting: Interventional Cardiology

## 2013-10-28 ENCOUNTER — Encounter: Payer: Self-pay | Admitting: Cardiology

## 2013-10-28 DIAGNOSIS — R002 Palpitations: Secondary | ICD-10-CM

## 2013-10-28 DIAGNOSIS — I059 Rheumatic mitral valve disease, unspecified: Secondary | ICD-10-CM | POA: Insufficient documentation

## 2013-10-28 DIAGNOSIS — I4891 Unspecified atrial fibrillation: Secondary | ICD-10-CM | POA: Insufficient documentation

## 2013-10-28 HISTORY — DX: Palpitations: R00.2

## 2013-10-28 HISTORY — DX: Rheumatic mitral valve disease, unspecified: I05.9

## 2013-10-28 HISTORY — DX: Unspecified atrial fibrillation: I48.91

## 2013-10-30 ENCOUNTER — Encounter: Payer: Self-pay | Admitting: Interventional Cardiology

## 2013-10-30 ENCOUNTER — Ambulatory Visit (INDEPENDENT_AMBULATORY_CARE_PROVIDER_SITE_OTHER): Payer: Commercial Managed Care - HMO | Admitting: Interventional Cardiology

## 2013-10-30 VITALS — BP 102/68 | HR 55 | Ht 67.0 in | Wt 136.0 lb

## 2013-10-30 DIAGNOSIS — I059 Rheumatic mitral valve disease, unspecified: Secondary | ICD-10-CM

## 2013-10-30 DIAGNOSIS — R002 Palpitations: Secondary | ICD-10-CM | POA: Diagnosis not present

## 2013-10-30 DIAGNOSIS — I4891 Unspecified atrial fibrillation: Secondary | ICD-10-CM | POA: Diagnosis not present

## 2013-10-30 NOTE — Progress Notes (Signed)
Patient ID: Deanna Shea, female   DOB: Jul 06, 1945, 68 y.o.   MRN: [id number]    [address], [address], [place]  [address] Phone: [phone] Fax:  [phone]  Date:  10/30/2013   ID:  Deanna Shea, DOB 1946-04-13, MRN [id number]  PCP:  No primary Atisha Hamidi on file.      History of Present Illness: Deanna Shea is a 68 y.o. female who had palpitations since she was 30 years. She went on inderal and lanoxin and has been on that combination for many years. When they tried to take her of or change the combination, she had her sx back. The diagnosis intially was mitral valve prolapse with atrial fibrillation.  Palpitations felt like a skipped beat lasting a second. She was in [place] at the time. She has not had a recent echocardiogram. She walks regularly without problems. No chest pain or SHOB. No syncope. Dizziness with standing too quickly.  Unfortunately, her husband passed away from metastatic parotid gland cancer in October 2014.  She walks a little in the neighborhood, 3x/week, . without sx   Wt Readings from Last 3 Encounters:  10/30/13 136 lb (61.689 kg)  07/27/11 138 lb (62.596 kg)     Past Medical History  Diagnosis Date  . Mitral valve prolapse     congenital   . Anxiety   . Atrial fibrillation 10/28/2013    diagnoses in the 20's, well controlled with Digoxin and Inderal   . Palpitations 10/28/2013  . Mitral valve disorders 10/28/2013  . Kidney stones   . Allergic rhinitis   . Kidney disease, medullary sponge   . Osteopenia     dexa 4/09, improved but not resolved 2011 DEXA  . MRSA (methicillin resistant Staphylococcus aureus)     9/10  . Gall stones   . Shingles     2000    Current Outpatient Prescriptions  Medication Sig Dispense Refill  . alendronate (FOSAMAX) 70 MG tablet Take 70 mg by mouth once a week. Take with a full glass of water on an empty stomach.      Marland Kitchen aspirin EC 81 MG tablet Take 81 mg by mouth daily.      . clonazePAM  (KLONOPIN) 0.5 MG tablet Take 0.5 mg by mouth daily.       . digoxin (LANOXIN) 0.25 MG tablet 0.25 mg. 1 tablet every other day      . escitalopram (LEXAPRO) 10 MG tablet Take 10 mg by mouth daily.      . propranolol (INDERAL) 20 MG tablet Take 20 mg by mouth 4 (four) times daily.      . risperiDONE (RISPERDAL) 2 MG tablet Take 2 mg by mouth at bedtime.       No current facility-administered medications for this visit.    Allergies:    Allergies  Allergen Reactions  . Sulfa Antibiotics Other (See Comments)    Sores in mouth    Social History:  The patient  reports that she has never smoked. She does not have any smokeless tobacco history on file. She reports that she does not drink alcohol or use illicit drugs.   Family History:  [name] family history includes Arthritis in her father; Cataracts in her father; Dementia in her father; Diabetes Mellitus II in her mother; Hypertension in her mother.   ROS:  Please see the history of present illness.  No nausea, vomiting.  No fevers, chills.  No focal weakness.  No dysuria. Depressed mood  since her husband died.  All other systems reviewed and negative.   PHYSICAL EXAM: VS:  BP 102/68  Pulse 55  Ht 5\' 7"  (1.702 m)  Wt 136 lb (61.689 kg)  BMI 21.30 kg/m2 Well nourished, well developed, in no acute distress HEENT: normal Neck: no JVD, no carotid bruits Cardiac:  normal S1, S2; RRR;  Lungs:  clear to auscultation bilaterally, no wheezing, rhonchi or rales Abd: soft, nontender, no hepatomegaly Ext: no edema Skin: warm and dry Neuro:   no focal abnormalities noted  EKG:  Sinus bradycardia, no ST segment changes     ASSESSMENT AND PLAN:  Atrial fibrillation  Continue Aspir-81 Tablet Delayed Release, 81 MG, 1 tablet, Orally, Once a day Continue Digoxin Tablet, 0.25 MG, 1 tablet, every other day Continue Propranolol HCl Tablet, 20 MG, take 1 tablet by mouth 4 times a day as directed Notes: Diagnosed many years ago. No ECG  tracing available. given her lack of symptoms, I am reluctant to increase her anticoagulation. We discussed Coumadin at length but without having documented atrial fibrillation, I would not put her on the stronger blood thinning agent. She is in agreement. If she has further symptoms, would plan for a monitor to try and document atrial fibrillation. Her digoxin level came down after the dose was decreased.    2. Palpitations  IMAGING: EKG    Plummer,Wanda 09/10/2012 02:11:24 PM > VARANASI,JAY 09/10/2012 03:02:23 PM > sinus bradycardia; NSST   Notes: Sx sound like PACs, PVCs. No recent sustained sx like AFib. Triggered by caffeine intake. Less when she avoids caffeine.     3. Mitral valve prolapse  IMAGING: EC Echocardiogram (Ordered for 09/10/2012)    Edwards,William 09/20/2012 04:42:19 PM > Echo report charted. Harward,Amy 09/20/2012 04:54:19 PM > pt notified. Pt will follow up in 1 year as scheduled and call if symptoms arise. VARANASI,JAY 09/20/2012 06:00:47 PM >   Notes: Diagnosed many years ago. we'll check echocardiogram.    Preventive Medicine  Adult topics discussed:  Diet: healthy diet.  Exercise: at least 30 minutes of aerobic exercise, 5 days a week.      Signed, Mina Marble, MD, [facility] 10/30/2013 2:04 PM

## 2013-10-30 NOTE — Patient Instructions (Signed)
Your physician recommends that you continue on your current medications as directed. Please refer to the Current Medication list given to you today.  Your physician wants you to follow-up in: 1 year with Dr. Varanasi. You will receive a reminder letter in the mail two months in advance. If you don't receive a letter, please call our office to schedule the follow-up appointment.  

## 2013-12-09 ENCOUNTER — Other Ambulatory Visit: Payer: Self-pay

## 2013-12-09 DIAGNOSIS — Z1231 Encounter for screening mammogram for malignant neoplasm of breast: Secondary | ICD-10-CM

## 2014-01-09 ENCOUNTER — Ambulatory Visit
Admission: RE | Admit: 2014-01-09 | Discharge: 2014-01-09 | Disposition: A | Discharge status: Home / Self Care | Payer: Commercial Managed Care - HMO | Source: Ambulatory Visit

## 2014-01-09 DIAGNOSIS — Z1231 Encounter for screening mammogram for malignant neoplasm of breast: Secondary | ICD-10-CM

## 2014-01-27 ENCOUNTER — Emergency Department (HOSPITAL_COMMUNITY): Payer: Medicare HMO

## 2014-01-27 ENCOUNTER — Inpatient Hospital Stay (HOSPITAL_COMMUNITY)
Admission: EM | Admit: 2014-01-27 | Discharge: 2014-02-07 | DRG: 329 | Disposition: A | Discharge status: Home Health | Payer: Medicare HMO | Attending: Surgery | Admitting: Surgery

## 2014-01-27 ENCOUNTER — Encounter (HOSPITAL_COMMUNITY): Payer: Self-pay | Admitting: Emergency Medicine

## 2014-01-27 DIAGNOSIS — K565 Intestinal adhesions [bands], unspecified as to partial versus complete obstruction: Secondary | ICD-10-CM | POA: Diagnosis present

## 2014-01-27 DIAGNOSIS — Z882 Allergy status to sulfonamides status: Secondary | ICD-10-CM | POA: Diagnosis not present

## 2014-01-27 DIAGNOSIS — M8580 Other specified disorders of bone density and structure, unspecified site: Secondary | ICD-10-CM | POA: Diagnosis present

## 2014-01-27 DIAGNOSIS — K352 Acute appendicitis with generalized peritonitis, without abscess: Secondary | ICD-10-CM | POA: Diagnosis present

## 2014-01-27 DIAGNOSIS — Y839 Surgical procedure, unspecified as the cause of abnormal reaction of the patient, or of later complication, without mention of misadventure at the time of the procedure: Secondary | ICD-10-CM | POA: Diagnosis not present

## 2014-01-27 DIAGNOSIS — F418 Other specified anxiety disorders: Secondary | ICD-10-CM | POA: Diagnosis present

## 2014-01-27 DIAGNOSIS — K631 Perforation of intestine (nontraumatic): Secondary | ICD-10-CM | POA: Diagnosis present

## 2014-01-27 DIAGNOSIS — F329 Major depressive disorder, single episode, unspecified: Secondary | ICD-10-CM | POA: Diagnosis present

## 2014-01-27 DIAGNOSIS — M899 Disorder of bone, unspecified: Secondary | ICD-10-CM | POA: Diagnosis present

## 2014-01-27 DIAGNOSIS — K35209 Acute appendicitis with generalized peritonitis, without abscess, unspecified as to perforation: Secondary | ICD-10-CM | POA: Diagnosis not present

## 2014-01-27 DIAGNOSIS — Z23 Encounter for immunization: Secondary | ICD-10-CM

## 2014-01-27 DIAGNOSIS — R109 Unspecified abdominal pain: Secondary | ICD-10-CM | POA: Diagnosis present

## 2014-01-27 DIAGNOSIS — F411 Generalized anxiety disorder: Secondary | ICD-10-CM | POA: Diagnosis present

## 2014-01-27 DIAGNOSIS — E876 Hypokalemia: Secondary | ICD-10-CM | POA: Diagnosis not present

## 2014-01-27 DIAGNOSIS — Z8249 Family history of ischemic heart disease and other diseases of the circulatory system: Secondary | ICD-10-CM | POA: Diagnosis not present

## 2014-01-27 DIAGNOSIS — K913 Postprocedural intestinal obstruction: Secondary | ICD-10-CM | POA: Diagnosis not present

## 2014-01-27 DIAGNOSIS — K56609 Unspecified intestinal obstruction, unspecified as to partial versus complete obstruction: Secondary | ICD-10-CM | POA: Diagnosis present

## 2014-01-27 DIAGNOSIS — K560 Paralytic ileus: Secondary | ICD-10-CM | POA: Diagnosis present

## 2014-01-27 DIAGNOSIS — M949 Disorder of cartilage, unspecified: Secondary | ICD-10-CM | POA: Diagnosis present

## 2014-01-27 DIAGNOSIS — I4891 Unspecified atrial fibrillation: Secondary | ICD-10-CM | POA: Diagnosis present

## 2014-01-27 DIAGNOSIS — Z833 Family history of diabetes mellitus: Secondary | ICD-10-CM | POA: Diagnosis not present

## 2014-01-27 HISTORY — DX: Schizophrenia, unspecified: F20.9

## 2014-01-27 HISTORY — DX: Major depressive disorder, single episode, unspecified: F32.9

## 2014-01-27 HISTORY — DX: Cardiac murmur, unspecified: R01.1

## 2014-01-27 HISTORY — DX: Depression, unspecified: F32.A

## 2014-01-27 LAB — URINALYSIS, ROUTINE W REFLEX MICROSCOPIC
Bilirubin Urine: NEGATIVE
GLUCOSE, UA: NEGATIVE mg/dL
HGB URINE DIPSTICK: NEGATIVE
Ketones, ur: 15 mg/dL — AB
LEUKOCYTES UA: NEGATIVE
Nitrite: NEGATIVE
PH: 6 (ref 5.0–8.0)
PROTEIN: NEGATIVE mg/dL
Specific Gravity, Urine: 1.006 (ref 1.005–1.030)
Urobilinogen, UA: 0.2 mg/dL (ref 0.0–1.0)

## 2014-01-27 LAB — CBC WITH DIFFERENTIAL/PLATELET
Basophils Absolute: 0 10*3/uL (ref 0.0–0.1)
Basophils Relative: 0 % (ref 0–1)
EOS PCT: 0 % (ref 0–5)
Eosinophils Absolute: 0 10*3/uL (ref 0.0–0.7)
HCT: 41.9 % (ref 36.0–46.0)
Hemoglobin: 14.3 g/dL (ref 12.0–15.0)
LYMPHS ABS: 1.1 10*3/uL (ref 0.7–4.0)
Lymphocytes Relative: 13 % (ref 12–46)
MCH: 29.9 pg (ref 26.0–34.0)
MCHC: 34.1 g/dL (ref 30.0–36.0)
MCV: 87.7 fL (ref 78.0–100.0)
Monocytes Absolute: 0.5 10*3/uL (ref 0.1–1.0)
Monocytes Relative: 6 % (ref 3–12)
NEUTROS ABS: 6.9 10*3/uL (ref 1.7–7.7)
NEUTROS PCT: 81 % — AB (ref 43–77)
Platelets: 416 10*3/uL — ABNORMAL HIGH (ref 150–400)
RBC: 4.78 MIL/uL (ref 3.87–5.11)
RDW: 12.4 % (ref 11.5–15.5)
WBC: 8.6 10*3/uL (ref 4.0–10.5)

## 2014-01-27 LAB — COMPREHENSIVE METABOLIC PANEL
ALK PHOS: 71 U/L (ref 39–117)
ALT: 22 U/L (ref 0–35)
AST: 19 U/L (ref 0–37)
Albumin: 3.2 g/dL — ABNORMAL LOW (ref 3.5–5.2)
Anion gap: 10 (ref 5–15)
BUN: 7 mg/dL (ref 6–23)
CO2: 29 meq/L (ref 19–32)
Calcium: 8.7 mg/dL (ref 8.4–10.5)
Chloride: 95 mEq/L — ABNORMAL LOW (ref 96–112)
Creatinine, Ser: 0.67 mg/dL (ref 0.50–1.10)
GFR, EST NON AFRICAN AMERICAN: 90 mL/min — AB (ref >90)
GLUCOSE: 122 mg/dL — AB (ref 70–99)
Potassium: 3.8 mEq/L (ref 3.7–5.3)
SODIUM: 134 meq/L — AB (ref 137–147)
Total Bilirubin: 0.4 mg/dL (ref 0.3–1.2)
Total Protein: 6.8 g/dL (ref 6.0–8.3)

## 2014-01-27 LAB — DIGOXIN LEVEL: Digoxin Level: 0.5 ng/mL — ABNORMAL LOW (ref 0.8–2.0)

## 2014-01-27 LAB — LIPASE, BLOOD: Lipase: 129 U/L — ABNORMAL HIGH (ref 11–59)

## 2014-01-27 MED ORDER — CHLORHEXIDINE GLUCONATE 0.12 % MT SOLN
15.0000 mL | Freq: Two times a day (BID) | OROMUCOSAL | Status: DC
  Administered 2014-01-27 – 2014-02-06 (×17): 15 mL via OROMUCOSAL
  Filled 2014-01-27 (×13): qty 15

## 2014-01-27 MED ORDER — METOPROLOL TARTRATE 1 MG/ML IV SOLN
5.0000 mg | Freq: Four times a day (QID) | INTRAVENOUS | Status: DC
  Administered 2014-01-27 – 2014-02-03 (×24): 5 mg via INTRAVENOUS
  Filled 2014-01-27 (×32): qty 5

## 2014-01-27 MED ORDER — IOHEXOL 300 MG/ML  SOLN
100.0000 mL | Freq: Once | INTRAMUSCULAR | Status: AC | PRN
  Administered 2014-01-27: 100 mL via INTRAVENOUS

## 2014-01-27 MED ORDER — IOHEXOL 300 MG/ML  SOLN
25.0000 mL | INTRAMUSCULAR | Status: AC
  Administered 2014-01-27: 25 mL via ORAL

## 2014-01-27 MED ORDER — LORAZEPAM 2 MG/ML IJ SOLN
0.5000 mg | Freq: Once | INTRAMUSCULAR | Status: AC
  Administered 2014-01-27: 0.5 mg via INTRAVENOUS
  Filled 2014-01-27: qty 1

## 2014-01-27 MED ORDER — KCL IN DEXTROSE-NACL 20-5-0.45 MEQ/L-%-% IV SOLN
INTRAVENOUS | Status: DC
  Administered 2014-01-27: 18:00:00 via INTRAVENOUS
  Administered 2014-01-28: 125 mL/h via INTRAVENOUS
  Administered 2014-01-28 – 2014-01-30 (×5): via INTRAVENOUS
  Filled 2014-01-27 (×10): qty 1000

## 2014-01-27 MED ORDER — PANTOPRAZOLE SODIUM 40 MG IV SOLR
40.0000 mg | Freq: Every day | INTRAVENOUS | Status: DC
  Administered 2014-01-27 – 2014-02-06 (×11): 40 mg via INTRAVENOUS
  Filled 2014-01-27 (×14): qty 40

## 2014-01-27 MED ORDER — LORAZEPAM 2 MG/ML IJ SOLN: 0.5000 mg | Freq: Four times a day (QID) | INTRAMUSCULAR | Status: DC | PRN

## 2014-01-27 MED ORDER — INFLUENZA VAC SPLIT QUAD 0.5 ML IM SUSY
0.5000 mL | PREFILLED_SYRINGE | INTRAMUSCULAR | Status: AC
  Administered 2014-01-28: 0.5 mL via INTRAMUSCULAR
  Filled 2014-01-27: qty 0.5

## 2014-01-27 MED ORDER — ONDANSETRON HCL 4 MG/2ML IJ SOLN: 4.0000 mg | Freq: Four times a day (QID) | INTRAMUSCULAR | Status: DC | PRN

## 2014-01-27 MED ORDER — SODIUM CHLORIDE 0.9 % IV SOLN
1000.0000 mL | INTRAVENOUS | Status: DC
Start: 2014-01-27 — End: 2014-01-27

## 2014-01-27 MED ORDER — DIPHENHYDRAMINE HCL 50 MG/ML IJ SOLN: 12.5000 mg | Freq: Four times a day (QID) | INTRAMUSCULAR | Status: DC | PRN

## 2014-01-27 MED ORDER — DIGOXIN 0.25 MG/ML IJ SOLN
0.2500 mg | INTRAMUSCULAR | Status: DC
  Administered 2014-01-28 – 2014-02-01 (×3): 0.25 mg via INTRAVENOUS
  Filled 2014-01-27 (×4): qty 1

## 2014-01-27 MED ORDER — MORPHINE SULFATE 2 MG/ML IJ SOLN
1.0000 mg | INTRAMUSCULAR | Status: DC | PRN
  Administered 2014-01-27 – 2014-01-29 (×3): 2 mg via INTRAVENOUS
  Administered 2014-01-29: 4 mg via INTRAVENOUS
  Administered 2014-01-29: 2 mg via INTRAVENOUS
  Administered 2014-01-30 (×3): 4 mg via INTRAVENOUS
  Administered 2014-01-30: 2 mg via INTRAVENOUS
  Administered 2014-01-30: 4 mg via INTRAVENOUS
  Administered 2014-01-30: 2 mg via INTRAVENOUS
  Filled 2014-01-27 (×2): qty 1
  Filled 2014-01-27 (×3): qty 2
  Filled 2014-01-27 (×3): qty 1
  Filled 2014-01-27 (×3): qty 2

## 2014-01-27 MED ORDER — SODIUM CHLORIDE 0.9 % IV SOLN: 1000.0000 mL | Freq: Once | INTRAVENOUS | Status: DC

## 2014-01-27 MED ORDER — LORAZEPAM 2 MG/ML IJ SOLN
0.5000 mg | Freq: Every day | INTRAMUSCULAR | Status: DC
  Administered 2014-01-27 – 2014-02-02 (×7): 0.5 mg via INTRAVENOUS
  Filled 2014-01-27 (×7): qty 1

## 2014-01-27 MED ORDER — ENOXAPARIN SODIUM 40 MG/0.4ML ~~LOC~~ SOLN
40.0000 mg | SUBCUTANEOUS | Status: DC
  Administered 2014-01-27: 40 mg via SUBCUTANEOUS
  Filled 2014-01-27 (×2): qty 0.4

## 2014-01-27 MED ORDER — ACETAMINOPHEN 325 MG PO TABS: 650.0000 mg | ORAL_TABLET | Freq: Four times a day (QID) | ORAL | Status: DC | PRN

## 2014-01-27 MED ORDER — DIPHENHYDRAMINE HCL 12.5 MG/5ML PO ELIX: 12.5000 mg | ORAL_SOLUTION | Freq: Four times a day (QID) | ORAL | Status: DC | PRN

## 2014-01-27 MED ORDER — ACETAMINOPHEN 650 MG RE SUPP: 650.0000 mg | Freq: Four times a day (QID) | RECTAL | Status: DC | PRN

## 2014-01-27 NOTE — ED Notes (Signed)
Attempted report X1

## 2014-01-27 NOTE — ED Notes (Signed)
Reports V/D for several weeks, no vomiting for 3-4 days, diarrhea continues, also c/o abd pain, denies CP, SOB or other complaints, A/O X4, ambulatory and in NAD

## 2014-01-27 NOTE — H&P (Signed)
Deanna Shea [birth year]  [id number].   Chief Complaint/Reason for Consult: abdominal pain, bloating HPI: This is a pleasant 68 yo white female who has a history of A fib who is controlled on digoxin and propanolol.  For the last 3 weeks she has felt as if she has had a stomach virus with abdominal bloating.  She denies nausea or vomiting.  She denies diarrhea, but looser than normal BMs.  She is normally constipated, but began having 3-4 BMs a day.  This has decreased to 1-2 per day recently.  After each meal, she would develop fullness and abdominal pain in the mid portion of her stomach.  She has lost 7lbs in the last 3 weeks.  She denies fevers, chills, weight gain, night sweats, night chills, CP, SOB, or difficulty urinating.  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  She has never had a colonoscopy.  Due to worsening distention, the patient presented to [facility] today where she had a CT scan that revealed calcifications of her gallbladder, but also severe thickening of her distal to terminal ileum with small bowel dilatation c/w SBO.  We have been asked to see her.  ROS: Please see HPI, otherwise negative.  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 Hypertension Mother   . Diabetes Mellitus II Mother   . Arthritis Father   . Cataracts Father   . Dementia Father   NO FH of crohn's, UC, or any GI malignancy  Past Medical History  Diagnosis Date  . Mitral valve prolapse     congenital   . Anxiety   . Atrial fibrillation 10/28/2013    diagnoses in the 20's, well controlled with Digoxin and Inderal   . Palpitations 10/28/2013  . Mitral valve disorders 10/28/2013  . Kidney stones   . Allergic rhinitis   . Kidney disease, medullary sponge   . Osteopenia     dexa 4/09, improved but not resolved 2011 DEXA  . MRSA  (methicillin resistant Staphylococcus aureus)     9/10  . Gall stones   . Shingles     2000    Past Surgical History  Procedure Laterality Date  . Right elbow tendon repair 7902,4097    . Left elbow arthroscopy 2007    . Cyst removal right elbow/tricep 2008    . Right knee bone chip removal 2004    . Right shoulder surgery x 2 1990's    . Sinus surgery x 2    . Right shoulder surgery, rotator cuff repair/scar    . Tissue cleaned up 02/2009      Social History:  reports that she has never smoked. She does not have any smokeless tobacco history on file. She reports that she does not drink alcohol or use illicit drugs.  Allergies:  Allergies  Allergen Reactions  . Sulfa Antibiotics Other (See Comments)    Sores in mouth     (Not in a hospital admission)  Blood pressure 125/60, pulse 72, temperature 97.9 F (36.6 C), temperature source Oral, resp. rate 20, SpO2 97.00%. Physical Exam: General: pleasant, WD, WN white female who is laying in bed in NAD HEENT: head is normocephalic, atraumatic.  Sclera are noninjected.  PERRL.  Ears and nose without any masses or lesions.  Mouth is pink and moist Heart: regular, rate, and  rhythm.  Normal s1,s2. No obvious murmurs, gallops, or rubs noted.  Palpable radial and pedal pulses bilaterally Lungs: CTAB, no wheezes, rhonchi, or rales noted.  Respiratory effort nonlabored Abd: distended, but still mildly soft, minimal tenderness right now, +BS, no masses, hernias, or organomegaly MS: all 4 extremities are symmetrical with no cyanosis, clubbing, or edema. Skin: warm and dry with no masses, lesions, or rashes Psych: A&Ox3 with a flat affect.    Results for orders placed during the hospital encounter of 01/27/14 (from the past 48 hour(s))  CBC WITH DIFFERENTIAL     Status: Abnormal   Collection Time    01/27/14 10:11 AM      Result Value Ref Range   WBC 8.6  4.0 - 10.5 K/uL   RBC 4.78  3.87 - 5.11 MIL/uL   Hemoglobin 14.3  12.0 - 15.0  g/dL   HCT 41.9  36.0 - 46.0 %   MCV 87.7  78.0 - 100.0 fL   MCH 29.9  26.0 - 34.0 pg   MCHC 34.1  30.0 - 36.0 g/dL   RDW 12.4  11.5 - 15.5 %   Platelets 416 (*) 150 - 400 K/uL   Neutrophils Relative % 81 (*) 43 - 77 %   Neutro Abs 6.9  1.7 - 7.7 K/uL   Lymphocytes Relative 13  12 - 46 %   Lymphs Abs 1.1  0.7 - 4.0 K/uL   Monocytes Relative 6  3 - 12 %   Monocytes Absolute 0.5  0.1 - 1.0 K/uL   Eosinophils Relative 0  0 - 5 %   Eosinophils Absolute 0.0  0.0 - 0.7 K/uL   Basophils Relative 0  0 - 1 %   Basophils Absolute 0.0  0.0 - 0.1 K/uL  COMPREHENSIVE METABOLIC PANEL     Status: Abnormal   Collection Time    01/27/14 10:11 AM      Result Value Ref Range   Sodium 134 (*) 137 - 147 mEq/L   Potassium 3.8  3.7 - 5.3 mEq/L   Chloride 95 (*) 96 - 112 mEq/L   CO2 29  19 - 32 mEq/L   Glucose, Bld 122 (*) 70 - 99 mg/dL   BUN 7  6 - 23 mg/dL   Creatinine, Ser 0.67  0.50 - 1.10 mg/dL   Calcium 8.7  8.4 - 10.5 mg/dL   Total Protein 6.8  6.0 - 8.3 g/dL   Albumin 3.2 (*) 3.5 - 5.2 g/dL   AST 19  0 - 37 U/L   ALT 22  0 - 35 U/L   Alkaline Phosphatase 71  39 - 117 U/L   Total Bilirubin 0.4  0.3 - 1.2 mg/dL   GFR calc non Af Amer 90 (*) >90 mL/min   GFR calc Af Amer >90  >90 mL/min   Comment: (NOTE)     The eGFR has been calculated using the CKD EPI equation.     This calculation has not been validated in all clinical situations.     eGFR's persistently <90 mL/min signify possible Chronic Kidney     Disease.   Anion gap 10  5 - 15  LIPASE, BLOOD     Status: Abnormal   Collection Time    01/27/14 10:11 AM      Result Value Ref Range   Lipase 129 (*) 11 - 59 U/L  DIGOXIN LEVEL     Status: Abnormal   Collection Time    01/27/14 10:11 AM  Result Value Ref Range   Digoxin Level 0.5 (*) 0.8 - 2.0 ng/mL  URINALYSIS, ROUTINE W REFLEX MICROSCOPIC     Status: Abnormal   Collection Time    01/27/14 10:50 AM      Result Value Ref Range   Color, Urine YELLOW  YELLOW   APPearance  CLEAR  CLEAR   Specific Gravity, Urine 1.006  1.005 - 1.030   pH 6.0  5.0 - 8.0   Glucose, UA NEGATIVE  NEGATIVE mg/dL   Hgb urine dipstick NEGATIVE  NEGATIVE   Bilirubin Urine NEGATIVE  NEGATIVE   Ketones, ur 15 (*) NEGATIVE mg/dL   Protein, ur NEGATIVE  NEGATIVE mg/dL   Urobilinogen, UA 0.2  0.0 - 1.0 mg/dL   Nitrite NEGATIVE  NEGATIVE   Leukocytes, UA NEGATIVE  NEGATIVE   Comment: MICROSCOPIC NOT DONE ON URINES WITH NEGATIVE PROTEIN, BLOOD, LEUKOCYTES, NITRITE, OR GLUCOSE <1000 mg/dL.   Ct Abdomen Pelvis W Contrast  01/27/2014   CLINICAL DATA:  Generalized abdomen pain for several weeks. Nausea, vomiting. Assess for small bowel obstruction.  EXAM: CT ABDOMEN AND PELVIS WITH CONTRAST  TECHNIQUE: Multidetector CT imaging of the abdomen and pelvis was performed using the standard protocol following bolus administration of intravenous contrast.  CONTRAST:  OMNIPAQUE IOHEXOL 300 MG/ML  SOLN  COMPARISON:  None.  FINDINGS: There is marked dilatation of the small bowel loops with transition in the distal ileum/terminal ileum several abnormal thick walled distal ileal loops. The colon is decompressed. Small amount of ascites is identified in the abdomen and pelvis. There is no free air.  There is fatty infiltration of liver. Degenerative joint changes of the spine are noted. The spleen, pancreas, adrenal glands and right kidney are normal. There are nonobstructing stones within the left kidney, largest measures 4 mm. In the gallbladder, there is suggestion of soft tissue density calcifications, suggesting a partially calcified gallbladder mass encompassing the gallbladder. The aorta demonstrates atherosclerosis without aneurysmal dilatation. There is no abdominal lymphadenopathy.  Partial fluid-filled bladder is normal. There is fluid within the endometrium, abnormal in a patient of post menopausal age. There is mild atelectasis of the posterior lung bases. There are small bilateral pleural effusions.   IMPRESSION: High-grade small bowel obstruction with transition in the distal ileum/terminal ileum with there are several abnormal thick wall small bowel loops present.  Ascites in the abdomen and pelvis.  In the gallbladder, there is suggestion of soft tissue density calcifications, suggesting a partially calcified gallbladder mass encompassing the gallbladder.  Fluid within the endometrial cavity, abnormal in the patient of postmenopausal age.   Electronically Signed   By: Sherian Rein M.D.   On: 01/27/2014 11:34   Dg Abd Portable 1v  01/27/2014   CLINICAL DATA:  Nasogastric tube placement  EXAM: PORTABLE ABDOMEN - 1 VIEW  COMPARISON:  CT abdomen and pelvis January 27, 2014  FINDINGS: Nasogastric tube tip is in the proximal stomach with the side port at the gastroesophageal junction. Generalized bowel dilatation remains. No free air appreciable on this supine examination. Contrast is noted in the renal collecting systems, ureter Shea bladder.  IMPRESSION: Nasogastric tube tip in stomach with side port at the gastroesophageal junction. It may be prudent to consider advancing the nasogastric tube 5-6 cm. The appearance of the bowel gas is consistent with the bowel obstruction documented on recent prior CT.   Electronically Signed   By: Bretta Bang M.D.   On: 01/27/2014 14:10       Assessment/Plan 1.  SBO with thickened TI (unknown etiology) 2. A fib, controlled with meds 3. Anxiety/Depression 4. Calcified gallstones  Plan: 1. The reason for the patient's small bowel thickening is unknown, but this appears to be the source of what is causing her small bowel obstruction as she has no palpable hernias or prior abdominal surgery, or obvious masses.  We will treat her conservatively with NGT and bowel rest. 2. Repeat abdominal films in the morning.  If they patient does not improve in 24-72 hours or worsens, she may require surgical intervention. 3. Lovenox/SCDs initiated today 4. IV digoxin  order for every other day as her normal dose. 5. Her psych meds have been held as I can not give these IV.  She has been given IV ativan for her clonazepam and prn anxiety.  If needed, we could try to clamp her tube and give her some of these meds, but the concern would be she may not absorb them given her current obstruction. 6. This plan was discussed with the patient and her daughter in law.  Both understand the plan and are agreeable to proceed.  All questions were answered. 7. We do not think the gallbladder is contributing to this situation in her abdomen currently.  Alycia Cooperwood E 01/27/2014, 2:44 PM Pager: [phone]

## 2014-01-27 NOTE — ED Provider Notes (Signed)
68 y.o. Female sent here from urgent care to rule out sbo.  Patient with reports of gastroenteritis three weeks ago now with one week of abdominal pain and diarrhea  PE WDWN female nad Affect flat Abdomen distended, bowel sounds presents, nontender with some rlq ttp  Ct Abdomen Pelvis W Contrast  01/27/2014   CLINICAL DATA:  Generalized abdomen pain for several weeks. Nausea, vomiting. Assess for small bowel obstruction.  EXAM: CT ABDOMEN AND PELVIS WITH CONTRAST  TECHNIQUE: Multidetector CT imaging of the abdomen and pelvis was performed using the standard protocol following bolus administration of intravenous contrast.  CONTRAST:  180mL OMNIPAQUE IOHEXOL 300 MG/ML  SOLN  COMPARISON:  None.  FINDINGS: There is marked dilatation of the small bowel loops with transition in the distal ileum/terminal ileum several abnormal thick walled distal ileal loops. The colon is decompressed. Small amount of ascites is identified in the abdomen and pelvis. There is no free air.  There is fatty infiltration of liver. Degenerative joint changes of the spine are noted. The spleen, pancreas, adrenal glands and right kidney are normal. There are nonobstructing stones within the left kidney, largest measures 4 mm. In the gallbladder, there is suggestion of soft tissue density calcifications, suggesting a partially calcified gallbladder mass encompassing the gallbladder. The aorta demonstrates atherosclerosis without aneurysmal dilatation. There is no abdominal lymphadenopathy.  Partial fluid-filled bladder is normal. There is fluid within the endometrium, abnormal in a patient of post menopausal age. There is mild atelectasis of the posterior lung bases. There are small bilateral pleural effusions.  IMPRESSION: High-grade small bowel obstruction with transition in the distal ileum/terminal ileum with there are several abnormal thick wall small bowel loops present.  Ascites in the abdomen and pelvis.  In the gallbladder, there  is suggestion of soft tissue density calcifications, suggesting a partially calcified gallbladder mass encompassing the gallbladder.  Fluid within the endometrial cavity, abnormal in the patient of postmenopausal age.   Electronically Signed   By: Abelardo Diesel M.D.   On: 01/27/2014 11:34   Mm Digital Screening Bilateral   I performed a history and physical examination of Rahma Vecchione and discussed her management with Ms. Sanders.  I agree with the history, physical, assessment, and plan of care, with the following exceptions: None  I was present for the following procedures: None Time Spent in Critical Care of the patient: None Time spent in discussions with the patient and family: 10  Megann Easterwood Shelda Jakes, MD 01/27/14 1231

## 2014-01-27 NOTE — ED Notes (Signed)
NG tube passed with no difficulty, procedure explained prior to placement.  Pt with no distress, no vomiting and no resp issues.

## 2014-01-27 NOTE — H&P (Signed)
Complex picture - unclear why she has this area of thickening in the terminal ileum.  I don't believe the gallbladder is the etiology for her current symptoms, although she probably needs to have cholecystectomy.  We will treat her conservatively initially as a small bowel obstruction with NG suction, hydration, bowel rest.  Repeat films in AM.  May need exploration if no improvement soon.  Imogene Burn. Georgette Dover, MD, [facility] Surgery  General/ Trauma Surgery  01/27/2014 3:46 PM

## 2014-01-27 NOTE — ED Provider Notes (Signed)
CSN: [id number]     Arrival date & time 01/27/14  0944 History   First MD Initiated Contact with Patient 01/27/14 [phone]     Chief Complaint  Patient presents with  . Diarrhea  . Abdominal Pain     (Consider location/radiation/quality/duration/timing/severity/associated sxs/prior Treatment) The history is provided by the patient and medical records.   This is a 68 y.o. F with PMH significant for AFIB, MVP, anxiety, kidney stones, MRSA, gallstones, presenting to the ED for abdominal pain and bloating. Patient states the past several weeks she has had loose stools that are a banana-like consistency.  States she has had some vomiting, but has since resolved. She denies any melena or hematochezia. She states she continues to feel bloated and that her abdomen is distended.  She is able to pass flatus and has continued eating and drinking normally, mostly following BRAT diet.  States fever several weeks ago, but this has also resolved. She denies any chills or sweats. She denies any urinary symptoms. She denies any chest pain, shortness of breath, palpitations, dizziness, or weakness. No recent changes in diet or abx use.  No prior abdominal surgeries.  Was seen by PCP yesterday and encouraged to come to the ED for further evaluation.  Past Medical History  Diagnosis Date  . Mitral valve prolapse     congenital   . Anxiety   . Atrial fibrillation 10/28/2013    diagnoses in the 20's, well controlled with Digoxin and Inderal   . Palpitations 10/28/2013  . Mitral valve disorders 10/28/2013  . Kidney stones   . Allergic rhinitis   . Kidney disease, medullary sponge   . Osteopenia     dexa 4/09, improved but not resolved 2011 DEXA  . MRSA (methicillin resistant Staphylococcus aureus)     9/10  . Gall stones   . Shingles     2000   Past Surgical History  Procedure Laterality Date  . Right elbow tendon repair 2426,8341    . Left elbow arthroscopy 2007    . Cyst removal right elbow/tricep 2008     . Right knee bone chip removal 2004    . Right shoulder surgery x 2 1990's    . Sinus surgery x 2    . Right shoulder surgery, rotator cuff repair/scar    . Tissue cleaned up 02/2009     Family History  Problem Relation Age of Onset  . Hypertension Mother   . Diabetes Mellitus II Mother   . Arthritis Father   . Cataracts Father   . Dementia Father    History  Substance Use Topics  . Smoking status: Never Smoker   . Smokeless tobacco: Not on file  . Alcohol Use: No   OB History   Grav Para Term Preterm Abortions TAB SAB Ect Mult Living                 Review of Systems  Gastrointestinal: Positive for nausea, vomiting, abdominal pain, diarrhea (loose stools) and abdominal distention.  All other systems reviewed and are negative.     Allergies  Sulfa antibiotics  Home Medications   Prior to Admission medications   Medication Sig Start Date End Date Taking? Authorizing Provider  alendronate (FOSAMAX) 70 MG tablet Take 70 mg by mouth once a week. Take with a full glass of water on an empty stomach.    Historical Provider, MD  aspirin EC 81 MG tablet Take 81 mg by mouth daily.    Historical Provider,  MD  clonazePAM (KLONOPIN) 0.5 MG tablet Take 0.5 mg by mouth daily.     Historical Provider, MD  digoxin (LANOXIN) 0.25 MG tablet 0.25 mg. 1 tablet every other day    Historical Provider, MD  escitalopram (LEXAPRO) 10 MG tablet Take 10 mg by mouth daily.    Historical Provider, MD  propranolol (INDERAL) 20 MG tablet Take 20 mg by mouth 4 (four) times daily.    Historical Provider, MD  risperiDONE (RISPERDAL) 2 MG tablet Take 2 mg by mouth at bedtime.    Historical Provider, MD   BP 125/56  Pulse 66  Temp(Src) 97.9 F (36.6 C) (Oral)  Resp 20  SpO2 99%  Physical Exam  Nursing note and vitals reviewed. Constitutional: She is oriented to person, place, and time. She appears well-developed and well-nourished. No distress.  HENT:  Head: Normocephalic and atraumatic.   Mouth/Throat: Oropharynx is clear and moist.  Eyes: Conjunctivae and EOM are normal. Pupils are equal, round, and reactive to light.  Neck: Normal range of motion. Neck supple.  Cardiovascular: Normal rate, regular rhythm and normal heart sounds.   Pulmonary/Chest: Effort normal and breath sounds normal. No respiratory distress. She has no wheezes.  Abdominal: Soft. She exhibits distension (mild). Bowel sounds are decreased. There is no tenderness. There is no guarding.  Abdomen mildly distended without rebound or guarding; decreased bowel sounds  Musculoskeletal: Normal range of motion. She exhibits no edema.  Neurological: She is alert and oriented to person, place, and time.  Skin: Skin is warm. She is not diaphoretic.  Psychiatric: She has a normal mood and affect.    ED Course  Procedures (including critical care time) Labs Review Labs Reviewed  CBC WITH DIFFERENTIAL - Abnormal; Notable for the following:    Platelets 416 (*)    Neutrophils Relative % 81 (*)    All other components within normal limits  COMPREHENSIVE METABOLIC PANEL - Abnormal; Notable for the following:    Sodium 134 (*)    Chloride 95 (*)    Glucose, Bld 122 (*)    Albumin 3.2 (*)    GFR calc non Af Amer 90 (*)    All other components within normal limits  LIPASE, BLOOD - Abnormal; Notable for the following:    Lipase 129 (*)    All other components within normal limits  URINALYSIS, ROUTINE W REFLEX MICROSCOPIC - Abnormal; Notable for the following:    Ketones, ur 15 (*)    All other components within normal limits  DIGOXIN LEVEL - Abnormal; Notable for the following:    Digoxin Level 0.5 (*)    All other components within normal limits    Imaging Review Ct Abdomen Pelvis W Contrast  01/27/2014   CLINICAL DATA:  Generalized abdomen pain for several weeks. Nausea, vomiting. Assess for small bowel obstruction.  EXAM: CT ABDOMEN AND PELVIS WITH CONTRAST  TECHNIQUE: Multidetector CT imaging of the  abdomen and pelvis was performed using the standard protocol following bolus administration of intravenous contrast.  CONTRAST:  171mL OMNIPAQUE IOHEXOL 300 MG/ML  SOLN  COMPARISON:  None.  FINDINGS: There is marked dilatation of the small bowel loops with transition in the distal ileum/terminal ileum several abnormal thick walled distal ileal loops. The colon is decompressed. Small amount of ascites is identified in the abdomen and pelvis. There is no free air.  There is fatty infiltration of liver. Degenerative joint changes of the spine are noted. The spleen, pancreas, adrenal glands and right kidney are normal.  There are nonobstructing stones within the left kidney, largest measures 4 mm. In the gallbladder, there is suggestion of soft tissue density calcifications, suggesting a partially calcified gallbladder mass encompassing the gallbladder. The aorta demonstrates atherosclerosis without aneurysmal dilatation. There is no abdominal lymphadenopathy.  Partial fluid-filled bladder is normal. There is fluid within the endometrium, abnormal in a patient of post menopausal age. There is mild atelectasis of the posterior lung bases. There are small bilateral pleural effusions.  IMPRESSION: High-grade small bowel obstruction with transition in the distal ileum/terminal ileum with there are several abnormal thick wall small bowel loops present.  Ascites in the abdomen and pelvis.  In the gallbladder, there is suggestion of soft tissue density calcifications, suggesting a partially calcified gallbladder mass encompassing the gallbladder.  Fluid within the endometrial cavity, abnormal in the patient of postmenopausal age.   Electronically Signed   By: Abelardo Diesel M.D.   On: 01/27/2014 11:34   Dg Abd Portable 1v  01/27/2014   CLINICAL DATA:  Nasogastric tube placement  EXAM: PORTABLE ABDOMEN - 1 VIEW  COMPARISON:  CT abdomen and pelvis January 27, 2014  FINDINGS: Nasogastric tube tip is in the proximal stomach  with the side port at the gastroesophageal junction. Generalized bowel dilatation remains. No free air appreciable on this supine examination. Contrast is noted in the renal collecting systems, ureter common bladder.  IMPRESSION: Nasogastric tube tip in stomach with side port at the gastroesophageal junction. It may be prudent to consider advancing the nasogastric tube 5-6 cm. The appearance of the bowel gas is consistent with the bowel obstruction documented on recent prior CT.   Electronically Signed   By: Lowella Grip M.D.   On: 01/27/2014 14:10     EKG Interpretation None      MDM   Final diagnoses:  SBO (small bowel obstruction)   68 year-old female with abdominal pain and loose stools over the past several weeks. On exam, she is afebrile and overall nontoxic appearing. Her abdomen does appear somewhat distended but is overall nontender.  Will obtain basic labs and CT scan for further evaluation.  Lab work is reassuring.  CT scan revealing high grade SBO as well as potential calcified gallbladder mass.  Case discussed with general surgery who has evaluated patient in the ED and agreed to admit.  NG tube placed in ED without difficulty.  Larene Pickett, PA-C 01/27/14 [phone]

## 2014-01-28 ENCOUNTER — Inpatient Hospital Stay (HOSPITAL_COMMUNITY): Payer: Medicare HMO

## 2014-01-28 DIAGNOSIS — K565 Intestinal adhesions [bands], unspecified as to partial versus complete obstruction: Secondary | ICD-10-CM | POA: Diagnosis not present

## 2014-01-28 LAB — CBC
HCT: 37.9 % (ref 36.0–46.0)
Hemoglobin: 12.7 g/dL (ref 12.0–15.0)
MCH: 29.6 pg (ref 26.0–34.0)
MCHC: 33.5 g/dL (ref 30.0–36.0)
MCV: 88.3 fL (ref 78.0–100.0)
Platelets: 385 10*3/uL (ref 150–400)
RBC: 4.29 MIL/uL (ref 3.87–5.11)
RDW: 12.7 % (ref 11.5–15.5)
WBC: 6.1 10*3/uL (ref 4.0–10.5)

## 2014-01-28 LAB — BASIC METABOLIC PANEL
ANION GAP: 11 (ref 5–15)
BUN: 6 mg/dL (ref 6–23)
CALCIUM: 8 mg/dL — AB (ref 8.4–10.5)
CO2: 28 meq/L (ref 19–32)
CREATININE: 0.63 mg/dL (ref 0.50–1.10)
Chloride: 99 mEq/L (ref 96–112)
GFR calc non Af Amer: >90 mL/min (ref >90)
Glucose, Bld: 123 mg/dL — ABNORMAL HIGH (ref 70–99)
Potassium: 3.6 mEq/L — ABNORMAL LOW (ref 3.7–5.3)
SODIUM: 138 meq/L (ref 137–147)

## 2014-01-28 MED ORDER — ENOXAPARIN SODIUM 40 MG/0.4ML ~~LOC~~ SOLN: 40.0000 mg | SUBCUTANEOUS | Status: DC

## 2014-01-28 MED ORDER — ENOXAPARIN SODIUM 40 MG/0.4ML ~~LOC~~ SOLN
40.0000 mg | SUBCUTANEOUS | Status: AC
  Administered 2014-01-28: 40 mg via SUBCUTANEOUS

## 2014-01-28 MED ORDER — POTASSIUM CHLORIDE 10 MEQ/100ML IV SOLN
10.0000 meq | INTRAVENOUS | Status: AC
  Administered 2014-01-28 (×4): 10 meq via INTRAVENOUS
  Filled 2014-01-28 (×4): qty 100

## 2014-01-28 NOTE — Progress Notes (Addendum)
Patient ID: Deanna Shea, female   DOB: 05-May-1946, 68 y.o.   MRN: [id number]    Subjective: Pt feels about the same today, but does feel a little less bloated.  No BM, a little flatus still  Objective: Vital signs in last 24 hours: Temp:  [97.7 F (36.5 C)-99.1 F (37.3 C)] 98.4 F (36.9 C) (09/23 0620) Pulse Rate:  [64-86] 86 (09/23 0620) Resp:  [15-20] 18 (09/23 0620) BP: (122-167)/(48-103) 131/59 mmHg (09/23 0620) SpO2:  [96 %-100 %] 97 % (09/23 0620) Weight:  [138 lb 10.7 oz (62.9 kg)] 138 lb 10.7 oz (62.9 kg) (09/22 1654) Last BM Date: 01/27/14  Intake/Output from previous day: 09/22 0701 - 09/23 0700 In: 603 [P.O.:120; I.V.:483] Out: 2 [Urine:950; Emesis/NG output:70] Intake/Output this shift:    PE: Abd: a little softer, but still distended, hypoactive BS, NT, NGT with minimal output Heart: regular Lungs: CTAB  Lab Results:   Recent Labs  01/27/14 1011 01/28/14 0345  WBC 8.6 6.1  HGB 14.3 12.7  HCT 41.9 37.9  PLT 416* 385   BMET  Recent Labs  01/27/14 1011 01/28/14 0345  NA 134* 138  K 3.8 3.6*  CL 95* 99  CO2 29 28  GLUCOSE 122* 123*  BUN 7 6  CREATININE 0.67 0.63  CALCIUM 8.7 8.0*   PT/INR No results found for this basename: LABPROT, INR,  in the last 72 hours CMP     Component Value Date/Time   NA 138 01/28/2014 0345   K 3.6* 01/28/2014 0345   CL 99 01/28/2014 0345   CO2 28 01/28/2014 0345   GLUCOSE 123* 01/28/2014 0345   BUN 6 01/28/2014 0345   CREATININE 0.63 01/28/2014 0345   CALCIUM 8.0* 01/28/2014 0345   PROT 6.8 01/27/2014 1011   ALBUMIN 3.2* 01/27/2014 1011   AST 19 01/27/2014 1011   ALT 22 01/27/2014 1011   ALKPHOS 71 01/27/2014 1011   BILITOT 0.4 01/27/2014 1011   GFRNONAA >90 01/28/2014 0345   GFRAA >90 01/28/2014 0345   Lipase     Component Value Date/Time   LIPASE 129* 01/27/2014 1011       Studies/Results: Ct Abdomen Pelvis W Contrast  01/27/2014   CLINICAL DATA:  Generalized abdomen pain for several weeks. Nausea,  vomiting. Assess for small bowel obstruction.  EXAM: CT ABDOMEN AND PELVIS WITH CONTRAST  TECHNIQUE: Multidetector CT imaging of the abdomen and pelvis was performed using the standard protocol following bolus administration of intravenous contrast.  CONTRAST:  160mL OMNIPAQUE IOHEXOL 300 MG/ML  SOLN  COMPARISON:  None.  FINDINGS: There is marked dilatation of the small bowel loops with transition in the distal ileum/terminal ileum several abnormal thick walled distal ileal loops. The colon is decompressed. Small amount of ascites is identified in the abdomen and pelvis. There is no free air.  There is fatty infiltration of liver. Degenerative joint changes of the spine are noted. The spleen, pancreas, adrenal glands and right kidney are normal. There are nonobstructing stones within the left kidney, largest measures 4 mm. In the gallbladder, there is suggestion of soft tissue density calcifications, suggesting a partially calcified gallbladder mass encompassing the gallbladder. The aorta demonstrates atherosclerosis without aneurysmal dilatation. There is no abdominal lymphadenopathy.  Partial fluid-filled bladder is normal. There is fluid within the endometrium, abnormal in a patient of post menopausal age. There is mild atelectasis of the posterior lung bases. There are small bilateral pleural effusions.  IMPRESSION: High-grade small bowel obstruction with transition in the distal ileum/terminal  ileum with there are several abnormal thick wall small bowel loops present.  Ascites in the abdomen and pelvis.  In the gallbladder, there is suggestion of soft tissue density calcifications, suggesting a partially calcified gallbladder mass encompassing the gallbladder.  Fluid within the endometrial cavity, abnormal in the patient of postmenopausal age.   Electronically Signed   By: Abelardo Diesel M.D.   On: 01/27/2014 11:34   Dg Abd 2 Views  01/28/2014   CLINICAL DATA:  Continued surveillance for small bowel  obstruction.  EXAM: ABDOMEN - 2 VIEW  COMPARISON:  01/27/2014.  FINDINGS: Nasogastric tube has been inserted further into the stomach. Marked distention of small bowel loops with air-fluid levels persists probably not significantly improved from yesterday. Contrast noted on yesterday's radiograph within the urinary tract has cleared. Small LEFT pleural effusion.  IMPRESSION: Persistent, and likely unchanged, small bowel obstruction.   Electronically Signed   By: Rolla Flatten M.D.   On: 01/28/2014 08:30   Dg Abd Portable 1v  01/27/2014   CLINICAL DATA:  Nasogastric tube placement  EXAM: PORTABLE ABDOMEN - 1 VIEW  COMPARISON:  CT abdomen and pelvis January 27, 2014  FINDINGS: Nasogastric tube tip is in the proximal stomach with the side port at the gastroesophageal junction. Generalized bowel dilatation remains. No free air appreciable on this supine examination. Contrast is noted in the renal collecting systems, ureter common bladder.  IMPRESSION: Nasogastric tube tip in stomach with side port at the gastroesophageal junction. It may be prudent to consider advancing the nasogastric tube 5-6 cm. The appearance of the bowel gas is consistent with the bowel obstruction documented on recent prior CT.   Electronically Signed   By: Lowella Grip M.D.   On: 01/27/2014 14:10    Anti-infectives: Anti-infectives   None       Assessment/Plan  1. SBO with thickened loop of small bowel at distal ileum 2. A fib 3. H/o visual and auditory hallucinations 4. hypokalemia  Plan: 1. Continue NGT management today.  She has a small amount of contrast that appears to be in her right colon today, but she is still very dilated.  Most of her bowels are air filled and so she has minimal out of her NGT. 2. Mobilize and pulm toilet.   3. Cont conservative management for now.  Repeat films in am  4. Replace K  LOS: 1 day    Ester Hilley E 01/28/2014, 8:40 AM Pager: [phone]

## 2014-01-28 NOTE — Progress Notes (Signed)
Still distended, minimal flatus Films - minimal oral contrast in cecum; significant air-fluid levels  Continue NG for one more day.  If no significant improvement, will probably need exploratory laparotomy with ileocecectomy.  Imogene Burn. Georgette Dover, MD, [facility] Surgery  General/ Trauma Surgery  01/28/2014 9:38 AM

## 2014-01-28 NOTE — ED Provider Notes (Signed)
Please see my note  Shaune Pollack, MD 01/28/14 [phone]

## 2014-01-29 ENCOUNTER — Inpatient Hospital Stay (HOSPITAL_COMMUNITY): Payer: Medicare HMO | Admitting: Anesthesiology

## 2014-01-29 ENCOUNTER — Encounter (HOSPITAL_COMMUNITY): Admission: EM | Disposition: A | Discharge status: Home Health | Payer: Self-pay | Source: Home / Self Care

## 2014-01-29 ENCOUNTER — Inpatient Hospital Stay (HOSPITAL_COMMUNITY): Payer: Medicare HMO

## 2014-01-29 ENCOUNTER — Encounter (HOSPITAL_COMMUNITY): Payer: Self-pay | Admitting: Anesthesiology

## 2014-01-29 ENCOUNTER — Encounter (HOSPITAL_COMMUNITY): Payer: Medicare HMO | Admitting: Anesthesiology

## 2014-01-29 HISTORY — PX: APPENDECTOMY: SHX54

## 2014-01-29 HISTORY — PX: LAPAROTOMY: SHX154

## 2014-01-29 HISTORY — PX: SMALL INTESTINE SURGERY: SHX150

## 2014-01-29 HISTORY — PX: EXPLORATORY LAPAROTOMY: SUR591

## 2014-01-29 LAB — BASIC METABOLIC PANEL
Anion gap: 9 (ref 5–15)
BUN: 4 mg/dL — ABNORMAL LOW (ref 6–23)
CO2: 29 mEq/L (ref 19–32)
Calcium: 8.1 mg/dL — ABNORMAL LOW (ref 8.4–10.5)
Chloride: 101 mEq/L (ref 96–112)
Creatinine, Ser: 0.71 mg/dL (ref 0.50–1.10)
GFR calc Af Amer: >90 mL/min (ref >90)
GFR calc non Af Amer: 88 mL/min — ABNORMAL LOW (ref >90)
GLUCOSE: 126 mg/dL — AB (ref 70–99)
POTASSIUM: 4.2 meq/L (ref 3.7–5.3)
SODIUM: 139 meq/L (ref 137–147)

## 2014-01-29 LAB — CBC
HCT: 39 % (ref 36.0–46.0)
HEMOGLOBIN: 12.9 g/dL (ref 12.0–15.0)
MCH: 29.8 pg (ref 26.0–34.0)
MCHC: 33.1 g/dL (ref 30.0–36.0)
MCV: 90.1 fL (ref 78.0–100.0)
PLATELETS: 370 10*3/uL (ref 150–400)
RBC: 4.33 MIL/uL (ref 3.87–5.11)
RDW: 12.8 % (ref 11.5–15.5)
WBC: 5.6 10*3/uL (ref 4.0–10.5)

## 2014-01-29 LAB — SURGICAL PCR SCREEN
MRSA, PCR: POSITIVE — AB
STAPHYLOCOCCUS AUREUS: POSITIVE — AB

## 2014-01-29 SURGERY — LAPAROTOMY, EXPLORATORY
Anesthesia: General | Site: Abdomen

## 2014-01-29 MED ORDER — FENTANYL CITRATE 0.05 MG/ML IJ SOLN
INTRAMUSCULAR | Status: AC
  Filled 2014-01-29: qty 5

## 2014-01-29 MED ORDER — NEOSTIGMINE METHYLSULFATE 10 MG/10ML IV SOLN
INTRAVENOUS | Status: DC | PRN
  Administered 2014-01-29: 1 mg via INTRAVENOUS
  Administered 2014-01-29: 4 mg via INTRAVENOUS

## 2014-01-29 MED ORDER — ARTIFICIAL TEARS OP OINT
TOPICAL_OINTMENT | OPHTHALMIC | Status: DC | PRN
  Administered 2014-01-29: 1 via OPHTHALMIC

## 2014-01-29 MED ORDER — ENOXAPARIN SODIUM 40 MG/0.4ML ~~LOC~~ SOLN
40.0000 mg | SUBCUTANEOUS | Status: DC
  Administered 2014-01-30 – 2014-02-07 (×9): 40 mg via SUBCUTANEOUS
  Filled 2014-01-29 (×9): qty 0.4

## 2014-01-29 MED ORDER — FENTANYL CITRATE 0.05 MG/ML IJ SOLN
INTRAMUSCULAR | Status: DC | PRN
  Administered 2014-01-29 (×2): 50 ug via INTRAVENOUS
  Administered 2014-01-29: 25 ug via INTRAVENOUS
  Administered 2014-01-29 (×4): 50 ug via INTRAVENOUS

## 2014-01-29 MED ORDER — MIDAZOLAM HCL 5 MG/5ML IJ SOLN
INTRAMUSCULAR | Status: DC | PRN
  Administered 2014-01-29: 1 mg via INTRAVENOUS

## 2014-01-29 MED ORDER — HYDROMORPHONE HCL 1 MG/ML IJ SOLN
INTRAMUSCULAR | Status: AC
  Administered 2014-01-29: 0.5 mg via INTRAVENOUS
  Filled 2014-01-29: qty 1

## 2014-01-29 MED ORDER — HYDROMORPHONE HCL 1 MG/ML IJ SOLN
INTRAMUSCULAR | Status: AC
  Filled 2014-01-29: qty 1

## 2014-01-29 MED ORDER — 0.9 % SODIUM CHLORIDE (POUR BTL) OPTIME
TOPICAL | Status: DC | PRN
  Administered 2014-01-29 (×4): 1000 mL

## 2014-01-29 MED ORDER — ONDANSETRON HCL 4 MG/2ML IJ SOLN
INTRAMUSCULAR | Status: DC | PRN
  Administered 2014-01-29: 4 mg via INTRAVENOUS

## 2014-01-29 MED ORDER — MIDAZOLAM HCL 2 MG/2ML IJ SOLN
INTRAMUSCULAR | Status: AC
  Filled 2014-01-29: qty 2

## 2014-01-29 MED ORDER — SUCCINYLCHOLINE CHLORIDE 20 MG/ML IJ SOLN
INTRAMUSCULAR | Status: AC
  Filled 2014-01-29: qty 1

## 2014-01-29 MED ORDER — LIDOCAINE HCL (CARDIAC) 20 MG/ML IV SOLN
INTRAVENOUS | Status: AC
  Filled 2014-01-29: qty 5

## 2014-01-29 MED ORDER — GLYCOPYRROLATE 0.2 MG/ML IJ SOLN
INTRAMUSCULAR | Status: AC
  Filled 2014-01-29: qty 3

## 2014-01-29 MED ORDER — HYDROMORPHONE HCL 1 MG/ML IJ SOLN
0.2500 mg | INTRAMUSCULAR | Status: DC | PRN
  Administered 2014-01-29 (×4): 0.5 mg via INTRAVENOUS

## 2014-01-29 MED ORDER — DEXAMETHASONE SODIUM PHOSPHATE 4 MG/ML IJ SOLN
INTRAMUSCULAR | Status: AC
  Filled 2014-01-29: qty 1

## 2014-01-29 MED ORDER — LACTATED RINGERS IV SOLN
INTRAVENOUS | Status: DC | PRN
  Administered 2014-01-29 (×2): via INTRAVENOUS

## 2014-01-29 MED ORDER — DIPHENHYDRAMINE HCL 50 MG/ML IJ SOLN
INTRAMUSCULAR | Status: AC
  Filled 2014-01-29: qty 1

## 2014-01-29 MED ORDER — GLYCOPYRROLATE 0.2 MG/ML IJ SOLN
INTRAMUSCULAR | Status: DC | PRN
  Administered 2014-01-29: .2 mg via INTRAVENOUS
  Administered 2014-01-29: .6 mg via INTRAVENOUS

## 2014-01-29 MED ORDER — DEXTROSE 5 % IV SOLN
1.0000 g | INTRAVENOUS | Status: AC
  Administered 2014-01-29 – 2014-02-05 (×8): 1 g via INTRAVENOUS
  Filled 2014-01-29 (×8): qty 10

## 2014-01-29 MED ORDER — ROCURONIUM BROMIDE 50 MG/5ML IV SOLN
INTRAVENOUS | Status: AC
  Filled 2014-01-29: qty 1

## 2014-01-29 MED ORDER — DEXAMETHASONE SODIUM PHOSPHATE 4 MG/ML IJ SOLN
INTRAMUSCULAR | Status: DC | PRN
  Administered 2014-01-29: 4 mg via INTRAVENOUS

## 2014-01-29 MED ORDER — ROCURONIUM BROMIDE 100 MG/10ML IV SOLN
INTRAVENOUS | Status: DC | PRN
  Administered 2014-01-29: 40 mg via INTRAVENOUS
  Administered 2014-01-29: 10 mg via INTRAVENOUS

## 2014-01-29 MED ORDER — CHLORHEXIDINE GLUCONATE CLOTH 2 % EX PADS
6.0000 | MEDICATED_PAD | Freq: Every day | CUTANEOUS | Status: AC
  Administered 2014-01-30 – 2014-02-03 (×5): 6 via TOPICAL

## 2014-01-29 MED ORDER — CEFOXITIN SODIUM 1 G IV SOLR
1.0000 g | Freq: Once | INTRAVENOUS | Status: AC
  Administered 2014-01-29: 1 g via INTRAVENOUS
  Filled 2014-01-29: qty 1

## 2014-01-29 MED ORDER — PROPOFOL 10 MG/ML IV BOLUS
INTRAVENOUS | Status: AC
  Filled 2014-01-29: qty 20

## 2014-01-29 MED ORDER — DIPHENHYDRAMINE HCL 50 MG/ML IJ SOLN
INTRAMUSCULAR | Status: DC | PRN
  Administered 2014-01-29: 10 mg via INTRAVENOUS

## 2014-01-29 MED ORDER — METRONIDAZOLE IN NACL 5-0.79 MG/ML-% IV SOLN
500.0000 mg | Freq: Three times a day (TID) | INTRAVENOUS | Status: AC
  Administered 2014-01-29 – 2014-02-05 (×23): 500 mg via INTRAVENOUS
  Filled 2014-01-29 (×24): qty 100

## 2014-01-29 MED ORDER — PROMETHAZINE HCL 25 MG/ML IJ SOLN: 6.2500 mg | INTRAMUSCULAR | Status: DC | PRN

## 2014-01-29 MED ORDER — PROPOFOL 10 MG/ML IV BOLUS
INTRAVENOUS | Status: DC | PRN
Start: 2014-01-29 — End: 2014-01-29
  Administered 2014-01-29: 140 mg via INTRAVENOUS

## 2014-01-29 MED ORDER — MUPIROCIN 2 % EX OINT
1.0000 "application " | TOPICAL_OINTMENT | Freq: Two times a day (BID) | CUTANEOUS | Status: AC
  Administered 2014-01-30 – 2014-02-02 (×9): 1 via NASAL
  Filled 2014-01-29 (×2): qty 22

## 2014-01-29 MED ORDER — SUCCINYLCHOLINE CHLORIDE 20 MG/ML IJ SOLN
INTRAMUSCULAR | Status: DC | PRN
Start: 2014-01-29 — End: 2014-01-29
  Administered 2014-01-29: 100 mg via INTRAVENOUS

## 2014-01-29 MED ORDER — LIDOCAINE HCL (CARDIAC) 20 MG/ML IV SOLN
INTRAVENOUS | Status: DC | PRN
  Administered 2014-01-29: 60 mg via INTRAVENOUS

## 2014-01-29 MED ORDER — ONDANSETRON HCL 4 MG/2ML IJ SOLN
INTRAMUSCULAR | Status: AC
  Filled 2014-01-29: qty 2

## 2014-01-29 SURGICAL SUPPLY — 52 items
BLADE SURG ROTATE 9660 (MISCELLANEOUS) IMPLANT
CANISTER SUCTION 2500CC (MISCELLANEOUS) ×3 IMPLANT
CANISTER WOUND CARE 500ML ATS (WOUND CARE) ×3 IMPLANT
CHLORAPREP W/TINT 26ML (MISCELLANEOUS) ×3 IMPLANT
COVER MAYO STAND STRL (DRAPES) IMPLANT
COVER SURGICAL LIGHT HANDLE (MISCELLANEOUS) ×3 IMPLANT
DRAPE LAPAROSCOPIC ABDOMINAL (DRAPES) ×3 IMPLANT
DRAPE PROXIMA HALF (DRAPES) IMPLANT
DRAPE UTILITY 15X26 W/TAPE STR (DRAPE) ×6 IMPLANT
DRAPE WARM FLUID 44X44 (DRAPE) ×3 IMPLANT
DRSG OPSITE POSTOP 4X10 (GAUZE/BANDAGES/DRESSINGS) IMPLANT
DRSG OPSITE POSTOP 4X8 (GAUZE/BANDAGES/DRESSINGS) IMPLANT
DRSG VAC ATS MED SENSATRAC (GAUZE/BANDAGES/DRESSINGS) ×3 IMPLANT
ELECT BLADE 6.5 EXT (BLADE) ×3 IMPLANT
ELECT CAUTERY BLADE 6.4 (BLADE) ×3 IMPLANT
ELECT REM PT RETURN 9FT ADLT (ELECTROSURGICAL) ×3
ELECTRODE REM PT RTRN 9FT ADLT (ELECTROSURGICAL) ×1 IMPLANT
GLOVE BIO SURGEON STRL SZ7 (GLOVE) ×6 IMPLANT
GLOVE BIO SURGEON STRL SZ7.5 (GLOVE) ×3 IMPLANT
GLOVE BIOGEL PI IND STRL 6.5 (GLOVE) ×2 IMPLANT
GLOVE BIOGEL PI IND STRL 7.5 (GLOVE) ×2 IMPLANT
GLOVE BIOGEL PI INDICATOR 6.5 (GLOVE) ×4
GLOVE BIOGEL PI INDICATOR 7.5 (GLOVE) ×4
GOWN STRL REUS W/ TWL LRG LVL3 (GOWN DISPOSABLE) ×2 IMPLANT
GOWN STRL REUS W/TWL LRG LVL3 (GOWN DISPOSABLE) ×4
KIT BASIN OR (CUSTOM PROCEDURE TRAY) ×3 IMPLANT
KIT ROOM TURNOVER OR (KITS) ×3 IMPLANT
LIGASURE IMPACT 36 18CM CVD LR (INSTRUMENTS) ×3 IMPLANT
NS IRRIG 1000ML POUR BTL (IV SOLUTION) ×12 IMPLANT
PACK GENERAL/GYN (CUSTOM PROCEDURE TRAY) ×3 IMPLANT
PAD ARMBOARD 7.5X6 YLW CONV (MISCELLANEOUS) ×3 IMPLANT
PENCIL BUTTON HOLSTER BLD 10FT (ELECTRODE) IMPLANT
RELOAD PROXIMATE 75MM BLUE (ENDOMECHANICALS) ×9 IMPLANT
SPECIMEN JAR LARGE (MISCELLANEOUS) ×3 IMPLANT
SPECIMEN JAR SMALL (MISCELLANEOUS) ×3 IMPLANT
SPONGE LAP 18X18 X RAY DECT (DISPOSABLE) IMPLANT
STAPLER GUN LINEAR PROX 60 (STAPLE) ×3 IMPLANT
STAPLER PROXIMATE 75MM BLUE (STAPLE) ×3 IMPLANT
STAPLER VISISTAT 35W (STAPLE) ×3 IMPLANT
SUCTION POOLE TIP (SUCTIONS) ×3 IMPLANT
SUT PDS AB 1 TP1 96 (SUTURE) ×6 IMPLANT
SUT SILK 2 0 SH CR/8 (SUTURE) ×3 IMPLANT
SUT SILK 2 0 TIES 10X30 (SUTURE) ×3 IMPLANT
SUT SILK 3 0 SH CR/8 (SUTURE) ×6 IMPLANT
SUT SILK 3 0 TIES 10X30 (SUTURE) ×3 IMPLANT
SUT VIC AB 3-0 SH 18 (SUTURE) IMPLANT
TOWEL OR 17X24 6PK STRL BLUE (TOWEL DISPOSABLE) ×3 IMPLANT
TOWEL OR 17X26 10 PK STRL BLUE (TOWEL DISPOSABLE) ×3 IMPLANT
TRAY FOLEY CATH 16FRSI W/METER (SET/KITS/TRAYS/PACK) ×3 IMPLANT
TUBE CONNECTING 12'X1/4 (SUCTIONS)
TUBE CONNECTING 12X1/4 (SUCTIONS) IMPLANT
YANKAUER SUCT BULB TIP NO VENT (SUCTIONS) IMPLANT

## 2014-01-29 NOTE — Anesthesia Postprocedure Evaluation (Signed)
  Anesthesia Post-op Note  Patient: Deanna Shea  Procedure(s) Performed: Procedure(s): EXPLORATORY LAPAROTOMY, SMALL BOWEL RESECTION, APPENDECTOMY (N/A)  Patient Location: PACU  Anesthesia Type:General  Level of Consciousness: awake, alert  and oriented  Airway and Oxygen Therapy: Patient Spontanous Breathing  Post-op Pain: none  Post-op Assessment: Post-op Vital signs reviewed  Post-op Vital Signs: Reviewed  Last Vitals:  Filed Vitals:   01/29/14 1411  BP: 144/59  Pulse: 104  Temp: 36.8 C  Resp: 20    Complications: No apparent anesthesia complications

## 2014-01-29 NOTE — Op Note (Signed)
Preop diagnosis: Small bowel obstruction Postop diagnosis: Small bowel obstruction secondary to apparent perforated appendicitis with 2 areas of small bowel perforation Procedure: Exploratory laparotomy, lysis of adhesions, small bowel resection with primary anastomosis, Appendectomy  Surgeon:Arham Symmonds K. Anesthesia: Gen. Endotracheal Indications: This is a 68 year old female Who presented with a three-week history of abdominal bloating and lower abdominal pain.  She presented to the emergency department for evaluation. She was noted to have significant thickening of the terminal ileum with proximal small bowel dilatation consistent with a small bowel structure. There is no sign of intra-abdominal abscess. She presents now for exploration after 48 hours of attempted conservative therapy with nasogastric suction.  Operative findings: The patient has an area of thick scarring and inflammation in the right lower quadrant involving some of the terminal ileum. This appeared to involve the tip of the appendix. There was a localized abscess with small perforation in the terminal ileum. The proximal small bowel is very dilated and there was a single area with a very tiny perforation. There were some mild contamination of the abdomen.  Description of procedure: The patient brought to the operating room and placed in supine position on the operating room table. After adequate level of general anesthesia was obtained, a Foley catheter was placed under sterile technique. [name] abdomen was prepped with ChloraPrep and draped sterile fashion. A timeout was taken to ensure the proper patient and proper procedure. We made a lower midline incision. Dissection was carried down the fascia which was divided vertically. We entered the peritoneal cavity bluntly. There was some free fluid within the abdomen. We placed a Balfour retractor and explored the abdomen. The proximal small bowel is quite dilated. The  nasogastric tube was palpated within the stomach. There is significant inflammation and adhesions in the right lower quadrant. The terminal ileum seems to be densely adherent in this area. The omentum was also adherent in the right lower quadrant. We bluntly dissected the omentum away from the inflammatory mass in the right lower quadrant. Sharp dissection was used to lyse some of the adhesions in the right lower quadrant along with some blunt dissection. We were able to identify the appendix and seem to be in the middle of this inflammation in the right lower quadrant. It is difficult to tell whether the appendix is secondarily involved or if this entire process was secondary to acute appendicitis.  There is a small bowel perforation in this area. There was some spillage but this was quickly controlled with 2-0 silk suture. This is in the mid ileum. We continued lysing all the adhesions until we identified the terminal ileum. The terminal ileum itself was decompressed. There is a clear transition point with the small bowel obstruction secondary to adhesions. We resected the short segment of small bowel that contained perforation. The appendix was removed after dividing the mesoappendix with the LigaSure device. The base of the appendix was divided with the GIA stapler. The small bowel was divided with 2 firings of the GIA-75 stapler and the mesentery was divided with the LigaSure device. We created a side-to-side anastomosis Using the GIA-75 stapler and the TA 60 stapler. The mesenteric defect was closed with 2-0 silk. A crotch suture of 3-0 silk was placed. We then inspected the proximal jejunum. We identified a very small pinhole perforation with some spilled succus entericus. We close this hole with 3-0 silk sutures. We carefully inspected this area no further leak was noted. We were able to decompress the proximal small bowel  into the stomach by milking the  Small bowel contents retrograde. We again inspected  the small bowel for any obstruction or leak. Our anastomosis appeared to be intact. Her suture line appeared to be intact. We irrigated the abdomen thoroughly and inspected for hemostasis. The omentum was placed over the anterior surface of the small bowel. The fascia was reapproximated with double-stranded #1 PDS suture. The subcutaneous tissues were irrigated. A VAC dressing was placed into the wound and secured with the occlusive drape. A was placed to suction. The patient was then extubated and brought to recovery in stable condition. All sponge, initially, and needle counts are correct.  Imogene Burn. Georgette Dover, MD, [facility] Surgery  General/ Trauma Surgery  01/29/2014 12:03 PM

## 2014-01-29 NOTE — Transfer of Care (Signed)
Immediate Anesthesia Transfer of Care Note  Patient: Deanna Shea  Procedure(s) Performed: Procedure(s): EXPLORATORY LAPAROTOMY, SMALL BOWEL RESECTION, APPENDECTOMY (N/A)  Patient Location: PACU  Anesthesia Type:General  Level of Consciousness: awake and alert   Airway & Oxygen Therapy: Patient Spontanous Breathing and Patient connected to nasal cannula oxygen  Post-op Assessment: Report given to PACU RN, Post -op Vital signs reviewed and stable and Patient moving all extremities  Post vital signs: Reviewed and stable  Complications: No apparent anesthesia complications

## 2014-01-29 NOTE — Anesthesia Procedure Notes (Signed)
Procedure Name: Intubation Date/Time: 01/29/2014 10:25 AM Performed by: Suzy Bouchard Pre-anesthesia Checklist: Patient identified, Emergency Drugs available, Suction available, Patient being monitored and Timeout performed Patient Re-evaluated:Patient Re-evaluated prior to inductionOxygen Delivery Method: Circle system utilized Preoxygenation: Pre-oxygenation with 100% oxygen Intubation Type: Cricoid Pressure applied, Rapid sequence and IV induction Laryngoscope Size: Miller and 2 Grade View: Grade I Tube type: Oral Tube size: 7.0 mm Number of attempts: 1 Placement Confirmation: ETT inserted through vocal cords under direct vision,  positive ETCO2,  CO2 detector and breath sounds checked- equal and bilateral Secured at: 22 cm Tube secured with: Tape Dental Injury: Teeth and Oropharynx as per pre-operative assessment  Comments: NG tube placed to suction prior to induction, then placed open to air for induction.

## 2014-01-29 NOTE — Anesthesia Preprocedure Evaluation (Addendum)
Anesthesia Evaluation  Patient identified by MRN, date of birth, ID band Patient awake    Reviewed: Allergy & Precautions, H&P , NPO status , Patient's Chart, lab work & pertinent test results, reviewed documented beta blocker date and time   Airway Mallampati: II TM Distance: >3 FB Neck ROM: Full    Dental  (+) Teeth Intact, Dental Advisory Given   Pulmonary neg pulmonary ROS,  breath sounds clear to auscultation        Cardiovascular + dysrhythmias (history of Afib, in NSR per last EKG) Atrial Fibrillation + Valvular Problems/Murmurs MVP Rhythm:Regular Rate:Normal     Neuro/Psych negative neurological ROS  negative psych ROS   GI/Hepatic negative GI ROS, Neg liver ROS, Small bowel obstruction- NG tube in place   Endo/Other  negative endocrine ROS  Renal/GU negative Renal ROS     Musculoskeletal negative musculoskeletal ROS (+)   Abdominal   Peds  Hematology negative hematology ROS (+)   Anesthesia Other Findings   Reproductive/Obstetrics                          Anesthesia Physical Anesthesia Plan  ASA: III  Anesthesia Plan: General   Post-op Pain Management:    Induction: Intravenous and Rapid sequence  Airway Management Planned: Oral ETT  Additional Equipment:   Intra-op Plan:   Post-operative Plan: Extubation in OR  Informed Consent: I have reviewed the patients History and Physical, chart, labs and discussed the procedure including the risks, benefits and alternatives for the proposed anesthesia with the patient or authorized representative who has indicated his/her understanding and acceptance.   Dental advisory given  Plan Discussed with: CRNA, Anesthesiologist and Surgeon  Anesthesia Plan Comments:         Anesthesia Quick Evaluation

## 2014-01-29 NOTE — Care Management Note (Signed)
  Page 1 of 1   02/06/2014     10:30:03 AM CARE MANAGEMENT NOTE 02/06/2014  Patient:  SEIRA, CODY   Account Number:  [id number]  Date Initiated:  01/29/2014  Documentation initiated by:  Magdalen Spatz  Subjective/Objective Assessment:     Action/Plan:   Anticipated DC Date:     Anticipated DC Plan:  [place]         Choice offered to / List presented to:  C-1 Patient        [place] arranged  HH-1 RN      [place].   Status of service:  Completed, signed off Medicare Important Message given?  YES (If response is "NO", the following Medicare IM given date fields will be blank) Date Medicare IM given:  02/06/2014 Medicare IM given by:  Magdalen Spatz Date Additional Medicare IM given:  02/03/2014 Additional Medicare IM given by:  Magdalen Spatz  Discharge Disposition:  [place]  Per UR Regulation:  Reviewed for med. necessity/level of care/duration of stay  If discussed at [place] of Stay Meetings, dates discussed:   02/05/2014    Comments:  02-03-14 Confirmed face sheet information with patient . Explianed IM . Patient voiced understanding . Copy of IM left with patient.  Provided list of home health agencies to patient , in case home health ordered . If VAC needed at home will need VAC application in shadow chart signed .  Thanks Magdalen Spatz RN BSN [phone]

## 2014-01-29 NOTE — Progress Notes (Signed)
Patient ID: Deanna Shea, female   DOB: 1946/02/01, 68 y.o.   MRN: [id number]    Subjective: Pt feels ok, still no BM.  Minimal flatus.  No pain.    Objective: Vital signs in last 24 hours: Temp:  [97.3 F (36.3 C)-98.5 F (36.9 C)] 97.5 F (36.4 C) (09/24 0554) Pulse Rate:  [65-85] 79 (09/24 0554) Resp:  [17-18] 17 (09/24 0554) BP: (126-145)/(47-57) 131/56 mmHg (09/24 0554) SpO2:  [94 %-99 %] 99 % (09/24 0554) Last BM Date: 01/27/14  Intake/Output from previous day: 09/23 0701 - 09/24 0700 In: 130 [NG/GT:130] Out: 4000 [Urine:3800; Emesis/NG output:200] Intake/Output this shift:    PE: Abd: softer, NT, few BS, NGT with watered down output Heart: regular Lungs: CTAB  Lab Results:   Recent Labs  01/28/14 0345 01/29/14 0534  WBC 6.1 5.6  HGB 12.7 12.9  HCT 37.9 39.0  PLT 385 370   BMET  Recent Labs  01/28/14 0345 01/29/14 0534  NA 138 139  K 3.6* 4.2  CL 99 101  CO2 28 29  GLUCOSE 123* 126*  BUN 6 4*  CREATININE 0.63 0.71  CALCIUM 8.0* 8.1*   PT/INR No results found for this basename: LABPROT, INR,  in the last 72 hours CMP     Component Value Date/Time   NA 139 01/29/2014 0534   K 4.2 01/29/2014 0534   CL 101 01/29/2014 0534   CO2 29 01/29/2014 0534   GLUCOSE 126* 01/29/2014 0534   BUN 4* 01/29/2014 0534   CREATININE 0.71 01/29/2014 0534   CALCIUM 8.1* 01/29/2014 0534   PROT 6.8 01/27/2014 1011   ALBUMIN 3.2* 01/27/2014 1011   AST 19 01/27/2014 1011   ALT 22 01/27/2014 1011   ALKPHOS 71 01/27/2014 1011   BILITOT 0.4 01/27/2014 1011   GFRNONAA 88* 01/29/2014 0534   GFRAA >90 01/29/2014 0534   Lipase     Component Value Date/Time   LIPASE 129* 01/27/2014 1011       Studies/Results: Dg Chest 2 View  01/28/2014   CLINICAL DATA:  Preop.  Small bowel obstruction.  Weakness.  EXAM: CHEST  2 VIEW  COMPARISON:  07/26/2011  FINDINGS: Cardiac silhouette is normal in size. Normal mediastinal and hilar contours.  Minor lung base subsegmental atelectasis.  Probable minimal left pleural effusion. No pneumonia or pulmonary edema. No pneumothorax.  Bony thorax is demineralized but intact.  Nasogastric tube extends below the diaphragm into the stomach.  IMPRESSION: No acute cardiopulmonary disease.   Electronically Signed   By: Lajean Manes M.D.   On: 01/28/2014 13:37   Ct Abdomen Pelvis W Contrast  01/27/2014   CLINICAL DATA:  Generalized abdomen pain for several weeks. Nausea, vomiting. Assess for small bowel obstruction.  EXAM: CT ABDOMEN AND PELVIS WITH CONTRAST  TECHNIQUE: Multidetector CT imaging of the abdomen and pelvis was performed using the standard protocol following bolus administration of intravenous contrast.  CONTRAST:  114mL OMNIPAQUE IOHEXOL 300 MG/ML  SOLN  COMPARISON:  None.  FINDINGS: There is marked dilatation of the small bowel loops with transition in the distal ileum/terminal ileum several abnormal thick walled distal ileal loops. The colon is decompressed. Small amount of ascites is identified in the abdomen and pelvis. There is no free air.  There is fatty infiltration of liver. Degenerative joint changes of the spine are noted. The spleen, pancreas, adrenal glands and right kidney are normal. There are nonobstructing stones within the left kidney, largest measures 4 mm. In the gallbladder, there is suggestion  of soft tissue density calcifications, suggesting a partially calcified gallbladder mass encompassing the gallbladder. The aorta demonstrates atherosclerosis without aneurysmal dilatation. There is no abdominal lymphadenopathy.  Partial fluid-filled bladder is normal. There is fluid within the endometrium, abnormal in a patient of post menopausal age. There is mild atelectasis of the posterior lung bases. There are small bilateral pleural effusions.  IMPRESSION: High-grade small bowel obstruction with transition in the distal ileum/terminal ileum with there are several abnormal thick wall small bowel loops present.  Ascites in the  abdomen and pelvis.  In the gallbladder, there is suggestion of soft tissue density calcifications, suggesting a partially calcified gallbladder mass encompassing the gallbladder.  Fluid within the endometrial cavity, abnormal in the patient of postmenopausal age.   Electronically Signed   By: Abelardo Diesel M.D.   On: 01/27/2014 11:34   Dg Abd 2 Views  01/28/2014   CLINICAL DATA:  Continued surveillance for small bowel obstruction.  EXAM: ABDOMEN - 2 VIEW  COMPARISON:  01/27/2014.  FINDINGS: Nasogastric tube has been inserted further into the stomach. Marked distention of small bowel loops with air-fluid levels persists probably not significantly improved from yesterday. Contrast noted on yesterday's radiograph within the urinary tract has cleared. Small LEFT pleural effusion.  IMPRESSION: Persistent, and likely unchanged, small bowel obstruction.   Electronically Signed   By: Rolla Flatten M.D.   On: 01/28/2014 08:30   Dg Abd Portable 1v  01/27/2014   CLINICAL DATA:  Nasogastric tube placement  EXAM: PORTABLE ABDOMEN - 1 VIEW  COMPARISON:  CT abdomen and pelvis January 27, 2014  FINDINGS: Nasogastric tube tip is in the proximal stomach with the side port at the gastroesophageal junction. Generalized bowel dilatation remains. No free air appreciable on this supine examination. Contrast is noted in the renal collecting systems, ureter common bladder.  IMPRESSION: Nasogastric tube tip in stomach with side port at the gastroesophageal junction. It may be prudent to consider advancing the nasogastric tube 5-6 cm. The appearance of the bowel gas is consistent with the bowel obstruction documented on recent prior CT.   Electronically Signed   By: Lowella Grip M.D.   On: 01/27/2014 14:10    Anti-infectives: Anti-infectives   None       Assessment/Plan  1. High grade PSBO 2. H/o A fib, controlled on dig 3. H/o auditory and visual hallucinations  Plan: 1. Patient's abdominal films show some  contrast in her colon, but still has dilated loops of small bowel that are concerning.  Will discuss with Dr. Georgette Dover to see if we are proceeding with OR today vs waiting.   LOS: 2 days    Dominique Calvey E 01/29/2014, 8:28 AM Pager: [phone]

## 2014-01-29 NOTE — Progress Notes (Signed)
Films show persistent SBO Less tender  Recommend exploratory laparotomy, possible small bowel resection.  The surgical procedure has been discussed with the patient.  Potential risks, benefits, alternative treatments, and expected outcomes have been explained.  All of the patient's questions at this time have been answered.  The likelihood of reaching the patient's treatment goal is good.  The patient understand the proposed surgical procedure and wishes to proceed.   Imogene Burn. Georgette Dover, MD, [facility]  General/ Trauma Surgery  01/29/2014 9:15 AM

## 2014-01-30 ENCOUNTER — Encounter (HOSPITAL_COMMUNITY): Payer: Self-pay | Admitting: Surgery

## 2014-01-30 LAB — BASIC METABOLIC PANEL
ANION GAP: 8 (ref 5–15)
BUN: 8 mg/dL (ref 6–23)
CHLORIDE: 101 meq/L (ref 96–112)
CO2: 29 mEq/L (ref 19–32)
Calcium: 7.7 mg/dL — ABNORMAL LOW (ref 8.4–10.5)
Creatinine, Ser: 0.84 mg/dL (ref 0.50–1.10)
GFR, EST AFRICAN AMERICAN: 82 mL/min — AB (ref >90)
GFR, EST NON AFRICAN AMERICAN: 71 mL/min — AB (ref >90)
Glucose, Bld: 150 mg/dL — ABNORMAL HIGH (ref 70–99)
Potassium: 5.4 mEq/L — ABNORMAL HIGH (ref 3.7–5.3)
SODIUM: 138 meq/L (ref 137–147)

## 2014-01-30 LAB — CBC
HCT: 47.7 % — ABNORMAL HIGH (ref 36.0–46.0)
Hemoglobin: 15.7 g/dL — ABNORMAL HIGH (ref 12.0–15.0)
MCH: 30 pg (ref 26.0–34.0)
MCHC: 32.9 g/dL (ref 30.0–36.0)
MCV: 91 fL (ref 78.0–100.0)
PLATELETS: 433 10*3/uL — AB (ref 150–400)
RBC: 5.24 MIL/uL — ABNORMAL HIGH (ref 3.87–5.11)
RDW: 13.1 % (ref 11.5–15.5)
WBC: 12.8 10*3/uL — AB (ref 4.0–10.5)

## 2014-01-30 MED ORDER — KETOROLAC TROMETHAMINE 30 MG/ML IJ SOLN
30.0000 mg | Freq: Four times a day (QID) | INTRAMUSCULAR | Status: AC
  Administered 2014-01-30 – 2014-02-01 (×8): 30 mg via INTRAVENOUS
  Filled 2014-01-30 (×9): qty 1

## 2014-01-30 MED ORDER — DIPHENHYDRAMINE HCL 50 MG/ML IJ SOLN: 12.5000 mg | Freq: Four times a day (QID) | INTRAMUSCULAR | Status: DC | PRN

## 2014-01-30 MED ORDER — HYDROMORPHONE 0.3 MG/ML IV SOLN
INTRAVENOUS | Status: DC
  Administered 2014-01-30: 1.2 mg via INTRAVENOUS
  Administered 2014-01-30: 16:00:00 via INTRAVENOUS
  Administered 2014-01-31: 1.8 mg via INTRAVENOUS
  Administered 2014-01-31: 0.6 mg via INTRAVENOUS
  Administered 2014-01-31: 0.3 mg via INTRAVENOUS
  Administered 2014-01-31 – 2014-02-01 (×2): 0.6 mg via INTRAVENOUS
  Administered 2014-02-01: 21:00:00 via INTRAVENOUS
  Administered 2014-02-01: 0.9 mg via INTRAVENOUS
  Administered 2014-02-01 – 2014-02-02 (×3): 0.6 mg via INTRAVENOUS
  Administered 2014-02-02: 0.9 mg via INTRAVENOUS
  Administered 2014-02-02: 0.6 mg via INTRAVENOUS
  Administered 2014-02-02: 1.5 mg via INTRAVENOUS
  Administered 2014-02-02: 0.3 mg via INTRAVENOUS
  Administered 2014-02-02: 0.9 mg via INTRAVENOUS
  Administered 2014-02-03 (×3): 0.3 mg via INTRAVENOUS
  Filled 2014-01-30 (×2): qty 25

## 2014-01-30 MED ORDER — DIPHENHYDRAMINE HCL 12.5 MG/5ML PO ELIX: 12.5000 mg | ORAL_SOLUTION | Freq: Four times a day (QID) | ORAL | Status: DC | PRN

## 2014-01-30 MED ORDER — SODIUM CHLORIDE 0.9 % IJ SOLN: 9.0000 mL | INTRAMUSCULAR | Status: DC | PRN

## 2014-01-30 MED ORDER — ONDANSETRON HCL 4 MG/2ML IJ SOLN: 4.0000 mg | Freq: Four times a day (QID) | INTRAMUSCULAR | Status: DC | PRN

## 2014-01-30 MED ORDER — NALOXONE HCL 0.4 MG/ML IJ SOLN: 0.4000 mg | INTRAMUSCULAR | Status: DC | PRN

## 2014-01-30 MED ORDER — DEXTROSE-NACL 5-0.45 % IV SOLN
INTRAVENOUS | Status: DC
  Administered 2014-01-30 – 2014-02-06 (×12): via INTRAVENOUS

## 2014-01-30 NOTE — Progress Notes (Signed)
1 Day Post-Op  Subjective: Patient is very tender - pain not adequately controlled with PRN morphine Has voided once No bowel function yet  Objective: Vital signs in last 24 hours: Temp:  [97.4 F (36.3 C)-98.2 F (36.8 C)] 97.4 F (36.3 C) (09/25 0620) Pulse Rate:  [62-105] 97 (09/25 0620) Resp:  [13-20] 16 (09/25 0620) BP: (114-146)/(42-62) 114/58 mmHg (09/25 0620) SpO2:  [99 %-100 %] 100 % (09/25 0620) Last BM Date: 01/27/14  Intake/Output from previous day: 09/24 0701 - 09/25 0700 In: 8320.8 [I.V.:8320.8] Out: 2275 [Urine:1775; Emesis/NG output:500] Intake/Output this shift: Total I/O In: 30 [NG/GT:30] Out: -   General appearance: alert, cooperative and appears uncomfortable Resp: clear to auscultation bilaterally Cardio: regular rate and rhythm, S1, S2 normal, no murmur, click, rub or gallop GI: tender around incision; minimally distended; hypoactive BS VAC with good seal  Lab Results:   Recent Labs  01/29/14 0534 01/30/14 0608  WBC 5.6 12.8*  HGB 12.9 15.7*  HCT 39.0 47.7*  PLT 370 433*   BMET  Recent Labs  01/29/14 0534 01/30/14 0608  NA 139 138  K 4.2 5.4*  CL 101 101  CO2 29 29  GLUCOSE 126* 150*  BUN 4* 8  CREATININE 0.71 0.84  CALCIUM 8.1* 7.7*   PT/INR No results found for this basename: LABPROT, INR,  in the last 72 hours ABG No results found for this basename: PHART, PCO2, PO2, HCO3,  in the last 72 hours  Studies/Results: Dg Chest 2 View  01/28/2014   CLINICAL DATA:  Preop.  Small bowel obstruction.  Weakness.  EXAM: CHEST  2 VIEW  COMPARISON:  07/26/2011  FINDINGS: Cardiac silhouette is normal in size. Normal mediastinal and hilar contours.  Minor lung base subsegmental atelectasis. Probable minimal left pleural effusion. No pneumonia or pulmonary edema. No pneumothorax.  Bony thorax is demineralized but intact.  Nasogastric tube extends below the diaphragm into the stomach.  IMPRESSION: No acute cardiopulmonary disease.    Electronically Signed   By: Lajean Manes M.D.   On: 01/28/2014 13:37   Dg Abd 2 Views  01/29/2014   CLINICAL DATA:  Small bowel obstruction, NG tube in place  EXAM: ABDOMEN - 2 VIEW  COMPARISON:  Prior abdominal radiographs 01/28/2014  FINDINGS: The nasogastric tube remains in stable position with the tip in the gastric fundus. Persistent and unchanged small bowel obstruction with numerous loops of air-filled and dilated small bowel in the mid and left abdomen. Persistent residual contrast material within the colon and rectum. There is a moderate volume of formed stool within the rectum which is also unchanged. No free air.  IMPRESSION: 1. Persistent and essentially unchanged small bowel obstruction. 2. Nasogastric tube remains in good position. 3. Similar degree of moderate retained stool in the rectal vault.   Electronically Signed   By: Jacqulynn Cadet M.D.   On: 01/29/2014 08:30    Anti-infectives: Anti-infectives   Start     Dose/Rate Route Frequency Ordered Stop   01/29/14 1600  cefTRIAXone (ROCEPHIN) 1 g in dextrose 5 % 50 mL IVPB     1 g 100 mL/hr over 30 Minutes Intravenous Every 24 hours 01/29/14 1441     01/29/14 1600  metroNIDAZOLE (FLAGYL) IVPB 500 mg     500 mg 100 mL/hr over 60 Minutes Intravenous 3 times per day 01/29/14 1441     01/29/14 0915  [MAR Hold]  cefOXitin (MEFOXIN) 1 g in dextrose 5 % 50 mL IVPB     (On  MAR Hold since 01/29/14 0951)   1 g 100 mL/hr over 30 Minutes Intravenous  Once 01/29/14 0909 01/29/14 1030      Assessment/Plan: s/p Procedure(s): EXPLORATORY LAPAROTOMY, SMALL BOWEL RESECTION, APPENDECTOMY (N/A) Continue NG tube until POIleus resolves Change to PCA/ add Toradol Abdominal binder Ambulate   LOS: 3 days    Deanna Shea K. 01/30/2014

## 2014-01-31 NOTE — Progress Notes (Signed)
2 Days Post-Op  Subjective: Patient is noticeably more comfortable with PCA No flatus or BM Voiding on her own Only 250 ml of NG output  Objective: Vital signs in last 24 hours: Temp:  [97.4 F (36.3 C)-97.8 F (36.6 C)] 97.6 F (36.4 C) (09/26 0531) Pulse Rate:  [78-92] 90 (09/26 0531) Resp:  [11-17] 13 (09/26 0531) BP: (114-147)/(46-60) 126/51 mmHg (09/26 0531) SpO2:  [94 %-100 %] 96 % (09/26 0531) Last BM Date: 01/27/14  Intake/Output from previous day: 09/25 0701 - 09/26 0700 In: 1960 [P.O.:120; I.V.:1810; NG/GT:30] Out: 950 [Urine:700; Emesis/NG output:250] Intake/Output this shift:    General appearance: alert, cooperative and no distress Resp: clear to auscultation bilaterally Cardio: regular rate and rhythm, S1, S2 normal, no murmur, click, rub or gallop GI: mildly distended; hypoactive bowel sounds VAC with good seal; no erythema to skin around wound  Lab Results:   Recent Labs  02/11/2014 0534 01/30/14 0608  WBC 5.6 12.8*  HGB 12.9 15.7*  HCT 39.0 47.7*  PLT 370 433*   BMET  Recent Labs  02-11-14 0534 01/30/14 0608  NA 139 138  K 4.2 5.4*  CL 101 101  CO2 29 29  GLUCOSE 126* 150*  BUN 4* 8  CREATININE 0.71 0.84  CALCIUM 8.1* 7.7*   PT/INR No results found for this basename: LABPROT, INR,  in the last 72 hours ABG No results found for this basename: PHART, PCO2, PO2, HCO3,  in the last 72 hours  Studies/Results: Dg Abd 2 Views  02/11/2014   CLINICAL DATA:  Small bowel obstruction, NG tube in place  EXAM: ABDOMEN - 2 VIEW  COMPARISON:  Prior abdominal radiographs 01/28/2014  FINDINGS: The nasogastric tube remains in stable position with the tip in the gastric fundus. Persistent and unchanged small bowel obstruction with numerous loops of air-filled and dilated small bowel in the mid and left abdomen. Persistent residual contrast material within the colon and rectum. There is a moderate volume of formed stool within the rectum which is also  unchanged. No free air.  IMPRESSION: 1. Persistent and essentially unchanged small bowel obstruction. 2. Nasogastric tube remains in good position. 3. Similar degree of moderate retained stool in the rectal vault.   Electronically Signed   By: Jacqulynn Cadet M.D.   On: February 11, 2014 08:30    Anti-infectives: Anti-infectives   Start     Dose/Rate Route Frequency Ordered Stop   February 11, 2014 1600  cefTRIAXone (ROCEPHIN) 1 g in dextrose 5 % 50 mL IVPB     1 g 100 mL/hr over 30 Minutes Intravenous Every 24 hours 02/11/2014 1441     2014/02/11 1600  metroNIDAZOLE (FLAGYL) IVPB 500 mg     500 mg 100 mL/hr over 60 Minutes Intravenous 3 times per day 2014-02-11 1441     February 11, 2014 0915  [MAR Hold]  cefOXitin (MEFOXIN) 1 g in dextrose 5 % 50 mL IVPB     (On MAR Hold since 02/11/14 0951)   1 g 100 mL/hr over 30 Minutes Intravenous  Once Feb 11, 2014 0909 Feb 11, 2014 1030      Assessment/Plan: s/p Procedure(s): EXPLORATORY LAPAROTOMY, SMALL BOWEL RESECTION, APPENDECTOMY (N/A) VAC change today Continue NG tube, awaiting resolution of post-op ileus Encourage ambulation   LOS: 4 days    Argusta Mcgann K. 01/31/2014

## 2014-02-01 LAB — BASIC METABOLIC PANEL
Anion gap: 9 (ref 5–15)
BUN: 4 mg/dL — ABNORMAL LOW (ref 6–23)
CALCIUM: 7.7 mg/dL — AB (ref 8.4–10.5)
CO2: 26 mEq/L (ref 19–32)
CREATININE: 0.45 mg/dL — AB (ref 0.50–1.10)
Chloride: 101 mEq/L (ref 96–112)
GFR calc Af Amer: >90 mL/min (ref >90)
GFR calc non Af Amer: >90 mL/min (ref >90)
GLUCOSE: 117 mg/dL — AB (ref 70–99)
Potassium: 3.9 mEq/L (ref 3.7–5.3)
Sodium: 136 mEq/L — ABNORMAL LOW (ref 137–147)

## 2014-02-01 LAB — CBC
HEMATOCRIT: 38.2 % (ref 36.0–46.0)
HEMOGLOBIN: 12.5 g/dL (ref 12.0–15.0)
MCH: 30.3 pg (ref 26.0–34.0)
MCHC: 32.7 g/dL (ref 30.0–36.0)
MCV: 92.5 fL (ref 78.0–100.0)
Platelets: 300 10*3/uL (ref 150–400)
RBC: 4.13 MIL/uL (ref 3.87–5.11)
RDW: 13.1 % (ref 11.5–15.5)
WBC: 9.6 10*3/uL (ref 4.0–10.5)

## 2014-02-01 NOTE — Plan of Care (Signed)
Problem: Phase I Progression Outcomes Goal: OOB as tolerated unless otherwise ordered Outcome: Completed/Met Date Met:  02/01/14 Walking in hallway

## 2014-02-01 NOTE — Plan of Care (Signed)
Problem: Phase I Progression Outcomes Goal: Pain controlled with appropriate interventions Outcome: Progressing Using PCA

## 2014-02-01 NOTE — Progress Notes (Signed)
3 Days Post-Op  Subjective: Labs have not been drawn yet this morning, despite being ordered for 0500.  Patient states that her pain is controlled No flatus, but she feels "rumbling" in her stomach VAC changed yesterday without any problems Minimal NG output Ambulated some yesterday  Objective: Vital signs in last 24 hours: Temp:  [97.6 F (36.4 C)-98.5 F (36.9 C)] 98.2 F (36.8 C) (09/27 0552) Pulse Rate:  [75-92] 80 (09/27 0552) Resp:  [12-17] 14 (09/27 0552) BP: (112-140)/(44-55) 126/47 mmHg (09/27 0552) SpO2:  [94 %-100 %] 100 % (09/27 0552) Last BM Date: 01/27/14  Intake/Output from previous day: 09/26 0701 - 09/27 0700 In: 3038.7 [P.O.:120; I.V.:2118.7; NG/GT:100; IV Piggyback:700] Out: 38 [Urine:2100; Emesis/NG output:200; Drains:25] Intake/Output this shift:    General appearance: alert, cooperative and no distress Resp: clear to auscultation bilaterally Cardio: regular rate and rhythm, S1, S2 normal, no murmur, click, rub or gallop GI: less distended; hypoactive bowel sounds; incisional tenderness VAC with good seal  Lab Results:   Recent Labs  01/30/14 0608  WBC 12.8*  HGB 15.7*  HCT 47.7*  PLT 433*   BMET  Recent Labs  01/30/14 0608  NA 138  K 5.4*  CL 101  CO2 29  GLUCOSE 150*  BUN 8  CREATININE 0.84  CALCIUM 7.7*   PT/INR No results found for this basename: LABPROT, INR,  in the last 72 hours ABG No results found for this basename: PHART, PCO2, PO2, HCO3,  in the last 72 hours  Studies/Results: No results found.  Anti-infectives: Anti-infectives   Start     Dose/Rate Route Frequency Ordered Stop   01/29/14 1600  cefTRIAXone (ROCEPHIN) 1 g in dextrose 5 % 50 mL IVPB     1 g 100 mL/hr over 30 Minutes Intravenous Every 24 hours 01/29/14 1441     01/29/14 1600  metroNIDAZOLE (FLAGYL) IVPB 500 mg     500 mg 100 mL/hr over 60 Minutes Intravenous 3 times per day 01/29/14 1441     01/29/14 0915  [MAR Hold]  cefOXitin (MEFOXIN) 1 g in  dextrose 5 % 50 mL IVPB     (On MAR Hold since 01/29/14 0951)   1 g 100 mL/hr over 30 Minutes Intravenous  Once 01/29/14 0909 01/29/14 1030      Assessment/Plan: s/p Procedure(s): EXPLORATORY LAPAROTOMY, SMALL BOWEL RESECTION, APPENDECTOMY (N/A) Continue NG suction until bowel function returns Check labs Encourage ambulation   LOS: 5 days    Deanna Shea K. 02/01/2014

## 2014-02-02 NOTE — Progress Notes (Signed)
Patient ID: Deanna Shea, female   DOB: 07-08-1945, 68 y.o.   MRN: [id number] 4 Days Post-Op  Subjective: Pt feels ok today.  Pain well controlled.  No flatus yet, but feels "gurggling" inside.  Mobilizing well.  Objective: Vital signs in last 24 hours: Temp:  [98.1 F (36.7 C)-98.5 F (36.9 C)] 98.2 F (36.8 C) (09/28 0617) Pulse Rate:  [81-89] 81 (09/28 0617) Resp:  [12-16] 14 (09/28 0746) BP: (116-128)/(45-59) 121/59 mmHg (09/28 0617) SpO2:  [95 %-98 %] 96 % (09/28 0746) Last BM Date: 01/27/14  Intake/Output from previous day: 09/27 0701 - 09/28 0700 In: 2621.7 [P.O.:180; I.V.:2441.7] Out: 2900 [Urine:2600; Emesis/NG output:300] Intake/Output this shift:    PE: Abd: soft, still mild distention, hypoactive BS, NGT with some watered down bilious output, minimal though.  Only 300 in 24hrs.  Wound VAC in place. Heart: regular Lungs: CTAB  Lab Results:   Recent Labs  02/01/14 0811  WBC 9.6  HGB 12.5  HCT 38.2  PLT 300   BMET  Recent Labs  02/01/14 0811  NA 136*  K 3.9  CL 101  CO2 26  GLUCOSE 117*  BUN 4*  CREATININE 0.45*  CALCIUM 7.7*   PT/INR No results found for this basename: LABPROT, INR,  in the last 72 hours CMP     Component Value Date/Time   NA 136* 02/01/2014 0811   K 3.9 02/01/2014 0811   CL 101 02/01/2014 0811   CO2 26 02/01/2014 0811   GLUCOSE 117* 02/01/2014 0811   BUN 4* 02/01/2014 0811   CREATININE 0.45* 02/01/2014 0811   CALCIUM 7.7* 02/01/2014 0811   PROT 6.8 01/27/2014 1011   ALBUMIN 3.2* 01/27/2014 1011   AST 19 01/27/2014 1011   ALT 22 01/27/2014 1011   ALKPHOS 71 01/27/2014 1011   BILITOT 0.4 01/27/2014 1011   GFRNONAA >90 02/01/2014 0811   GFRAA >90 02/01/2014 0811   Lipase     Component Value Date/Time   LIPASE 129* 01/27/2014 1011       Studies/Results: No results found.  Anti-infectives: Anti-infectives   Start     Dose/Rate Route Frequency Ordered Stop   01/29/14 1600  cefTRIAXone (ROCEPHIN) 1 g in dextrose 5 % 50 mL  IVPB     1 g 100 mL/hr over 30 Minutes Intravenous Every 24 hours 01/29/14 1441     01/29/14 1600  metroNIDAZOLE (FLAGYL) IVPB 500 mg     500 mg 100 mL/hr over 60 Minutes Intravenous 3 times per day 01/29/14 1441     01/29/14 0915  [MAR Hold]  cefOXitin (MEFOXIN) 1 g in dextrose 5 % 50 mL IVPB     (On MAR Hold since 01/29/14 0951)   1 g 100 mL/hr over 30 Minutes Intravenous  Once 01/29/14 0909 01/29/14 1030       Assessment/Plan  1. POD 4, s/p ex lap with SBR and appendectomy 2. Post op ileus 3. A fib  Plan: 1. Patient has a few BS, but still a little bloated.  Her NGT mostly seems watered down.  I suspect we could try to clamp her NGT today and see how she does. 2. Cont to mobilize and pulm toilet 3. Cont IV Dig right now while unable to take POs 4. Cont Rocephin and Flagyl D4/7.   LOS: 6 days    Deanna Shea 02/02/2014, 7:55 AM Pager: [phone]

## 2014-02-02 NOTE — Progress Notes (Signed)
Has good BS. Tolerating NGT clamped. Patient examined and I agree with the assessment and plan  Georganna Skeans, MD, MPH, FACS Trauma: [phone] General Surgery: [phone]  02/02/2014 1:46 PM

## 2014-02-03 LAB — CREATININE, SERUM
Creatinine, Ser: 0.49 mg/dL — ABNORMAL LOW (ref 0.50–1.10)
GFR calc Af Amer: >90 mL/min (ref >90)
GFR calc non Af Amer: >90 mL/min (ref >90)

## 2014-02-03 MED ORDER — HYDROMORPHONE HCL 1 MG/ML IJ SOLN
0.5000 mg | INTRAMUSCULAR | Status: DC | PRN
  Administered 2014-02-03 – 2014-02-05 (×10): 1 mg via INTRAVENOUS
  Administered 2014-02-05 (×2): 2 mg via INTRAVENOUS
  Administered 2014-02-05 (×2): 1 mg via INTRAVENOUS
  Administered 2014-02-06 (×2): 2 mg via INTRAVENOUS
  Filled 2014-02-03: qty 2
  Filled 2014-02-03 (×3): qty 1
  Filled 2014-02-03: qty 2
  Filled 2014-02-03 (×2): qty 1
  Filled 2014-02-03: qty 2
  Filled 2014-02-03 (×7): qty 1
  Filled 2014-02-03: qty 2
  Filled 2014-02-03: qty 1

## 2014-02-03 MED ORDER — DIGOXIN 250 MCG PO TABS
0.2500 mg | ORAL_TABLET | ORAL | Status: DC
  Administered 2014-02-03 – 2014-02-07 (×3): 0.25 mg via ORAL
  Filled 2014-02-03: qty 1
  Filled 2014-02-03: qty 2
  Filled 2014-02-03 (×2): qty 1

## 2014-02-03 MED ORDER — ESCITALOPRAM OXALATE 10 MG PO TABS
10.0000 mg | ORAL_TABLET | Freq: Every day | ORAL | Status: DC
  Administered 2014-02-03 – 2014-02-07 (×5): 10 mg via ORAL
  Filled 2014-02-03 (×5): qty 1

## 2014-02-03 MED ORDER — CLONAZEPAM 0.5 MG PO TABS
0.5000 mg | ORAL_TABLET | Freq: Every day | ORAL | Status: DC
  Administered 2014-02-03 – 2014-02-06 (×4): 0.5 mg via ORAL
  Filled 2014-02-03 (×4): qty 1

## 2014-02-03 MED ORDER — PROPRANOLOL HCL 20 MG PO TABS
20.0000 mg | ORAL_TABLET | Freq: Four times a day (QID) | ORAL | Status: DC
  Administered 2014-02-03 – 2014-02-07 (×11): 20 mg via ORAL
  Filled 2014-02-03 (×24): qty 1

## 2014-02-03 MED ORDER — RISPERIDONE 2 MG PO TABS
2.0000 mg | ORAL_TABLET | Freq: Every day | ORAL | Status: DC
  Administered 2014-02-03 – 2014-02-06 (×4): 2 mg via ORAL
  Filled 2014-02-03 (×7): qty 1

## 2014-02-03 NOTE — Progress Notes (Signed)
Looks a bit better. Try sips of clears. Patient examined and I agree with the assessment and plan  Georganna Skeans, MD, MPH, FACS Trauma: [phone] General Surgery: [phone]  02/03/2014 10:11 AM

## 2014-02-03 NOTE — Progress Notes (Signed)
Patient ID: Deanna Shea, female   DOB: Oct 04, 1945, 68 y.o.   MRN: [id number] 5 Days Post-Op  Subjective: Pt feels well this morning.  No nausea with NGT clamped, but still no flatus yet.    Objective: Vital signs in last 24 hours: Temp:  [97.6 F (36.4 C)-98.4 F (36.9 C)] 98 F (36.7 C) (09/29 0438) Pulse Rate:  [71-96] 96 (09/29 0438) Resp:  [12-18] 16 (09/29 0800) BP: (121-143)/(49-67) 128/64 mmHg (09/29 0438) SpO2:  [95 %-98 %] 97 % (09/29 0800) Last BM Date: 01/27/14  Intake/Output from previous day: 09/28 0701 - 09/29 0700 In: 2284 [I.V.:2284] Out: 2275 [Urine:2250; Drains:25] Intake/Output this shift: Total I/O In: -  Out: 400 [Urine:400]  PE: Abd: soft, still with some mild bloating, but some BS.  NGT with no residual.  This was pulled with no problems.  Midline incision with VAC in place (will eval with VAC change today)  Lab Results:   Recent Labs  02/01/14 0811  WBC 9.6  HGB 12.5  HCT 38.2  PLT 300   BMET  Recent Labs  02/01/14 0811 02/03/14 0453  NA 136*  --   K 3.9  --   CL 101  --   CO2 26  --   GLUCOSE 117*  --   BUN 4*  --   CREATININE 0.45* 0.49*  CALCIUM 7.7*  --    PT/INR No results found for this basename: LABPROT, INR,  in the last 72 hours CMP     Component Value Date/Time   NA 136* 02/01/2014 0811   K 3.9 02/01/2014 0811   CL 101 02/01/2014 0811   CO2 26 02/01/2014 0811   GLUCOSE 117* 02/01/2014 0811   BUN 4* 02/01/2014 0811   CREATININE 0.49* 02/03/2014 0453   CALCIUM 7.7* 02/01/2014 0811   PROT 6.8 01/27/2014 1011   ALBUMIN 3.2* 01/27/2014 1011   AST 19 01/27/2014 1011   ALT 22 01/27/2014 1011   ALKPHOS 71 01/27/2014 1011   BILITOT 0.4 01/27/2014 1011   GFRNONAA >90 02/03/2014 0453   GFRAA >90 02/03/2014 0453   Lipase     Component Value Date/Time   LIPASE 129* 01/27/2014 1011       Studies/Results: No results found.  Anti-infectives: Anti-infectives   Start     Dose/Rate Route Frequency Ordered Stop   01/29/14 1600   cefTRIAXone (ROCEPHIN) 1 g in dextrose 5 % 50 mL IVPB     1 g 100 mL/hr over 30 Minutes Intravenous Every 24 hours 01/29/14 1441     01/29/14 1600  metroNIDAZOLE (FLAGYL) IVPB 500 mg     500 mg 100 mL/hr over 60 Minutes Intravenous 3 times per day 01/29/14 1441     01/29/14 0915  [MAR Hold]  cefOXitin (MEFOXIN) 1 g in dextrose 5 % 50 mL IVPB     (On MAR Hold since 01/29/14 0951)   1 g 100 mL/hr over 30 Minutes Intravenous  Once 01/29/14 0909 01/29/14 1030       Assessment/Plan  1. POD 5, s/p ex lap with SBR and appendectomy 2. Post op ileus, resolving 3. H/o auditory and visual hallucinations 4. H/o a fib  Plan: 1. DC NGT today.  Give her sips of clears from the floor as she is still a little bloated and not passing any flatus yet, but has BS. 2. Resume her normal home meds today for her a fib and her psych meds.  All IV forms stopped 3. Cont to mobilize and pulm  toilet   LOS: 7 days    Lorree Millar E 02/03/2014, 9:18 AM Pager: [phone]

## 2014-02-04 MED ORDER — DIPHENHYDRAMINE HCL 50 MG/ML IJ SOLN: 12.5000 mg | Freq: Four times a day (QID) | INTRAMUSCULAR | Status: DC | PRN

## 2014-02-04 MED ORDER — DIPHENHYDRAMINE HCL 12.5 MG/5ML PO ELIX: 12.5000 mg | ORAL_SOLUTION | Freq: Four times a day (QID) | ORAL | Status: DC | PRN

## 2014-02-04 MED ORDER — LORAZEPAM 2 MG/ML IJ SOLN: 0.5000 mg | Freq: Four times a day (QID) | INTRAMUSCULAR | Status: DC | PRN

## 2014-02-04 NOTE — Progress Notes (Signed)
Improving, active BS. Clears Patient examined and I agree with the assessment and plan  Georganna Skeans, MD, MPH, FACS Trauma: [phone] General Surgery: [phone]  02/04/2014 1:11 PM

## 2014-02-04 NOTE — Progress Notes (Signed)
Patient ID: Deanna Shea, female   DOB: 28-Feb-1946, 68 y.o.   MRN: [id number] 6 Days Post-Op  Subjective: Pt feels well.  Passing some flatus today.  No nausea with sips of clears yesterday.  Objective: Vital signs in last 24 hours: Temp:  [97.6 F (36.4 C)-98.4 F (36.9 C)] 97.6 F (36.4 C) (09/30 0455) Pulse Rate:  [60-88] 69 (09/30 0455) Resp:  [16-17] 17 (09/30 0455) BP: (122-128)/(64-67) 128/67 mmHg (09/30 0455) SpO2:  [97 %-98 %] 97 % (09/30 0455) Last BM Date: 01/27/14  Intake/Output from previous day: 09/29 0701 - 09/30 0700 In: 2408 [I.V.:2308; IV Piggyback:100] Out: 2250 [Urine:2250] Intake/Output this shift:    PE: Abd: soft, mild bloating, good BS today, minimally tender, incision is clean and packed  Lab Results:  No results found for this basename: WBC, HGB, HCT, PLT,  in the last 72 hours BMET  Recent Labs  02/03/14 0453  CREATININE 0.49*   PT/INR No results found for this basename: LABPROT, INR,  in the last 72 hours CMP     Component Value Date/Time   NA 136* 02/01/2014 0811   K 3.9 02/01/2014 0811   CL 101 02/01/2014 0811   CO2 26 02/01/2014 0811   GLUCOSE 117* 02/01/2014 0811   BUN 4* 02/01/2014 0811   CREATININE 0.49* 02/03/2014 0453   CALCIUM 7.7* 02/01/2014 0811   PROT 6.8 01/27/2014 1011   ALBUMIN 3.2* 01/27/2014 1011   AST 19 01/27/2014 1011   ALT 22 01/27/2014 1011   ALKPHOS 71 01/27/2014 1011   BILITOT 0.4 01/27/2014 1011   GFRNONAA >90 02/03/2014 0453   GFRAA >90 02/03/2014 0453   Lipase     Component Value Date/Time   LIPASE 129* 01/27/2014 1011       Studies/Results: No results found.  Anti-infectives: Anti-infectives   Start     Dose/Rate Route Frequency Ordered Stop   01/29/14 1600  cefTRIAXone (ROCEPHIN) 1 g in dextrose 5 % 50 mL IVPB     1 g 100 mL/hr over 30 Minutes Intravenous Every 24 hours 01/29/14 1441     01/29/14 1600  metroNIDAZOLE (FLAGYL) IVPB 500 mg     500 mg 100 mL/hr over 60 Minutes Intravenous 3 times per day  01/29/14 1441     01/29/14 0915  [MAR Hold]  cefOXitin (MEFOXIN) 1 g in dextrose 5 % 50 mL IVPB     (On MAR Hold since 01/29/14 0951)   1 g 100 mL/hr over 30 Minutes Intravenous  Once 01/29/14 0909 01/29/14 1030       Assessment/Plan   1. POD 6, s/p ex lap with SBR and appendectomy  2. Post op ileus, resolving  3. H/o auditory and visual hallucinations  4. H/o a fib  Plan: 1. Will give clear liquid tray today and plan for full liquids in the am if tolerates the clears today. 2. Cont BID dressing changes.  VAC DC yesterday and transitioned to NS WD dressing changes to midline wound 3. Lovenox/SCDs 4. D 6 Rocephin/Flagyl.  Will stop date these for tomorrow D7.  LOS: 8 days    Brookes Craine E 02/04/2014, 8:31 AM Pager: [phone]

## 2014-02-05 LAB — CREATININE, SERUM
CREATININE: 0.61 mg/dL (ref 0.50–1.10)
GFR calc Af Amer: >90 mL/min (ref >90)

## 2014-02-05 NOTE — Progress Notes (Signed)
Patient ID: Deanna Shea, female   DOB: 18-Mar-1946, 68 y.o.   MRN: [id number] 7 Days Post-Op  Subjective: Pt feels well today, tolerating her full liquids this am for breakfast.  Feels a little bloated. Had 2 episodes of diarrhea.   Objective: Vital signs in last 24 hours: Temp:  [97.3 F (36.3 C)-97.4 F (36.3 C)] 97.4 F (36.3 C) (10/01 0655) Pulse Rate:  [66-74] 74 (10/01 0655) Resp:  [16] 16 (10/01 0655) BP: (115-124)/(50-68) 122/58 mmHg (10/01 0655) SpO2:  [97 %-98 %] 98 % (10/01 0655) Last BM Date: 02/04/14  Intake/Output from previous day: 09/30 0701 - 10/01 0700 In: 2874 [P.O.:1243; I.V.:1531; IV Piggyback:100] Out: 600 [Urine:600] Intake/Output this shift: Total I/O In: 220 [P.O.:220] Out: 200 [Urine:200]  PE: Abd: soft, a bit more distended today, but minimally tender, good BS, wound is clean and packed.  Lab Results:  No results found for this basename: WBC, HGB, HCT, PLT,  in the last 72 hours BMET  Recent Labs  02/03/14 0453 02/05/14 0516  CREATININE 0.49* 0.61   PT/INR No results found for this basename: LABPROT, INR,  in the last 72 hours CMP     Component Value Date/Time   NA 136* 02/01/2014 0811   K 3.9 02/01/2014 0811   CL 101 02/01/2014 0811   CO2 26 02/01/2014 0811   GLUCOSE 117* 02/01/2014 0811   BUN 4* 02/01/2014 0811   CREATININE 0.61 02/05/2014 0516   CALCIUM 7.7* 02/01/2014 0811   PROT 6.8 01/27/2014 1011   ALBUMIN 3.2* 01/27/2014 1011   AST 19 01/27/2014 1011   ALT 22 01/27/2014 1011   ALKPHOS 71 01/27/2014 1011   BILITOT 0.4 01/27/2014 1011   GFRNONAA >90 02/05/2014 0516   GFRAA >90 02/05/2014 0516   Lipase     Component Value Date/Time   LIPASE 129* 01/27/2014 1011       Studies/Results: No results found.  Anti-infectives: Anti-infectives   Start     Dose/Rate Route Frequency Ordered Stop   01/29/14 1600  cefTRIAXone (ROCEPHIN) 1 g in dextrose 5 % 50 mL IVPB     1 g 100 mL/hr over 30 Minutes Intravenous Every 24 hours 01/29/14  1441 02/05/14 2359   01/29/14 1600  metroNIDAZOLE (FLAGYL) IVPB 500 mg     500 mg 100 mL/hr over 60 Minutes Intravenous 3 times per day 01/29/14 1441 02/05/14 2359   01/29/14 0915  [MAR Hold]  cefOXitin (MEFOXIN) 1 g in dextrose 5 % 50 mL IVPB     (On MAR Hold since 01/29/14 0951)   1 g 100 mL/hr over 30 Minutes Intravenous  Once 01/29/14 0909 01/29/14 1030       Assessment/Plan  1. POD 7, s/p ex lap with SBR and appendectomy  2. Post op ileus, resolving  3. H/o auditory and visual hallucinations  4. H/o a fib   Plan: 1. Patient is tolerating her diet well.  She is slightly more distended today, but still soft and tolerating her diet well.  Will leave on fulls and see how she does. 2. D 7 of Rocephin/Flagyl.  DC after today. 3. Cont dressing changes  LOS: 9 days    Deanna Shea E 02/05/2014, 10:13 AM Pager: [phone]

## 2014-02-05 NOTE — Progress Notes (Signed)
Patient examined and I agree with the assessment and plan  Georganna Skeans, MD, MPH, FACS Trauma: [phone] General Surgery: [phone]  02/05/2014 5:49 PM

## 2014-02-06 MED ORDER — OXYCODONE-ACETAMINOPHEN 5-325 MG PO TABS
1.0000 | ORAL_TABLET | ORAL | Status: DC | PRN
  Administered 2014-02-06 – 2014-02-07 (×3): 1 via ORAL
  Filled 2014-02-06 (×3): qty 1

## 2014-02-06 MED ORDER — FUROSEMIDE 10 MG/ML IJ SOLN
20.0000 mg | Freq: Once | INTRAMUSCULAR | Status: AC
  Administered 2014-02-06: 20 mg via INTRAVENOUS

## 2014-02-06 MED ORDER — FUROSEMIDE 10 MG/ML IJ SOLN
INTRAMUSCULAR | Status: AC
  Filled 2014-02-06: qty 4

## 2014-02-06 MED ORDER — OXYCODONE-ACETAMINOPHEN 5-325 MG PO TABS: 1.0000 | ORAL_TABLET | ORAL | Status: DC | PRN

## 2014-02-06 NOTE — Progress Notes (Signed)
Patient ID: Deanna Shea, female   DOB: [birth year], 68 y.o.   MRN: [id number] 8 Days Post-Op  Subjective: Pt feels well today.  Tolerating her full liquids.  Still passing flatus.  No further diarrhea.  Feels less bloated.  Objective: Vital signs in last 24 hours: Temp:  [97.6 F (36.4 C)-97.8 F (36.6 C)] 97.6 F (36.4 C) (10/02 1336) Pulse Rate:  [67-72] 70 (10/02 1336) Resp:  [16] 16 (10/02 1336) BP: (100-145)/(50-61) 100/50 mmHg (10/02 1336) SpO2:  [93 %-97 %] 97 % (10/02 1336) Last BM Date: 02/05/14  Intake/Output from previous day: 10/01 0701 - 10/02 0700 In: 1292.5 [P.O.:500; I.V.:492.5; IV Piggyback:300] Out: 1200 [Urine:1200] Intake/Output this shift: Total I/O In: 240 [P.O.:240] Out: 800 [Urine:800]  PE: Abd: soft, less bloated but still some, some anasarca, midline wound open and clean.  +BS Heart: regular Lungs: CTAB  Lab Results:  No results found for this basename: WBC, HGB, HCT, PLT,  in the last 72 hours BMET  Recent Labs  02/05/14 0516  CREATININE 0.61   PT/INR No results found for this basename: LABPROT, INR,  in the last 72 hours CMP     Component Value Date/Time   NA 136* 02/01/2014 0811   K 3.9 02/01/2014 0811   CL 101 02/01/2014 0811   CO2 26 02/01/2014 0811   GLUCOSE 117* 02/01/2014 0811   BUN 4* 02/01/2014 0811   CREATININE 0.61 02/05/2014 0516   CALCIUM 7.7* 02/01/2014 0811   PROT 6.8 01/27/2014 1011   ALBUMIN 3.2* 01/27/2014 1011   AST 19 01/27/2014 1011   ALT 22 01/27/2014 1011   ALKPHOS 71 01/27/2014 1011   BILITOT 0.4 01/27/2014 1011   GFRNONAA >90 02/05/2014 0516   GFRAA >90 02/05/2014 0516   Lipase     Component Value Date/Time   LIPASE 129* 01/27/2014 1011       Studies/Results: No results found.  Anti-infectives: Anti-infectives   Start     Dose/Rate Route Frequency Ordered Stop   01/29/14 1600  cefTRIAXone (ROCEPHIN) 1 g in dextrose 5 % 50 mL IVPB     1 g 100 mL/hr over 30 Minutes Intravenous Every 24 hours 01/29/14 1441  02/05/14 1622   01/29/14 1600  metroNIDAZOLE (FLAGYL) IVPB 500 mg     500 mg 100 mL/hr over 60 Minutes Intravenous 3 times per day 01/29/14 1441 02/05/14 2341   01/29/14 0915  [MAR Hold]  cefOXitin (MEFOXIN) 1 g in dextrose 5 % 50 mL IVPB     (On MAR Hold since 01/29/14 0951)   1 g 100 mL/hr over 30 Minutes Intravenous  Once 01/29/14 0909 01/29/14 1030       Assessment/Plan   1. POD 7, s/p ex lap with SBR and appendectomy  2. Post op ileus, resolving  3. H/o auditory and visual hallucinations  4. H/o a fib  Plan: 1. Will advance to solid diet today.  Watch her bloating, but this seems to be some better than yesterday.  She is without nausea. 2. She does have some body wall anasarca. Will give 20 mg of lasix.  I have saline locked her IV as well. 3. HH has been arranged for dressing changes at home. 4. Hopefully can go home tomorrow.  LOS: 10 days    Yasaman Kolek E 02/06/2014, 2:01 PM Pager: [phone]

## 2014-02-06 NOTE — Discharge Instructions (Signed)
[facility]      [facility] °[phone] ° °OPEN ABDOMINAL SURGERY: POST OP INSTRUCTIONS ° °Always review your discharge instruction sheet given to you by the facility where your surgery was performed. ° °IF YOU HAVE DISABILITY OR FAMILY LEAVE FORMS, YOU MUST BRING THEM TO THE OFFICE FOR PROCESSING.  PLEASE DO NOT GIVE THEM TO YOUR DOCTOR. ° °1. A prescription for pain medication may be given to you upon discharge.  Take your pain medication as prescribed, if needed.  If narcotic pain medicine is not needed, then you may take acetaminophen (Tylenol) or ibuprofen (Advil) as needed. °2. Take your usually prescribed medications unless otherwise directed. °3. If you need a refill on your pain medication, please contact your pharmacy. They will contact our office to request authorization.  Prescriptions will not be filled after 5pm or on week-ends. °4. You should follow a light diet the first few days after arrival home, such as soup and crackers, pudding, etc.unless your doctor has advised otherwise. A high-fiber, low fat diet can be resumed as tolerated.   Be sure to include lots of fluids daily. Most patients will experience some swelling and bruising on the chest and neck area.  Ice packs will help.  Swelling and bruising can take several days to resolve °5. Most patients will experience some swelling and bruising in the area of the incision. Ice pack will help. Swelling and bruising can take several days to resolve..  °6. It is common to experience some constipation if taking pain medication after surgery.  Increasing fluid intake and taking a stool softener will usually help or prevent this problem from occurring.  A mild laxative (Milk of Magnesia or Miralax) should be taken according to package directions if there are no bowel movements after 48 hours. °7.  You may have steri-strips (small skin tapes) in place directly over the incision.  These strips should be left on the skin for 7-10 days.  If your  surgeon used skin glue on the incision, you may shower in 24 hours.  The glue will flake off over the next 2-3 weeks.  Any sutures or staples will be removed at the office during your follow-up visit. You may find that a light gauze bandage over your incision may keep your staples from being rubbed or pulled. You may shower and replace the bandage daily. °8. ACTIVITIES:  You may resume regular (light) daily activities beginning the next day--such as daily self-care, walking, climbing stairs--gradually increasing activities as tolerated.  You may have sexual intercourse when it is comfortable.  Refrain from any heavy lifting or straining until approved by your doctor. °a. You may drive when you no longer are taking prescription pain medication, you can comfortably wear a seatbelt, and you can safely maneuver your car and apply brakes °b. Return to Work: ___________________________________ °9. You should see your doctor in the office for a follow-up appointment approximately two weeks after your surgery.  Make sure that you call for this appointment within a day or two after you arrive home to insure a convenient appointment time. °OTHER INSTRUCTIONS:  °_____________________________________________________________ °_____________________________________________________________ ° °WHEN TO CALL YOUR DOCTOR: °1. Fever over 101.0 °2. Inability to urinate °3. Nausea and/or vomiting °4. Extreme swelling or bruising °5. Continued bleeding from incision. °6. Increased pain, redness, or drainage from the incision. °7. Difficulty swallowing or breathing °8. Muscle cramping or spasms. °9. Numbness or tingling in hands or feet or around lips. ° °The clinic staff is available to   answer your questions during regular business hours.  Please don’t hesitate to call and ask to speak to one of the nurses if you have concerns. ° °For further questions, please visit [email or web address] ° ° °Dressing Change °A dressing is a  material placed over wounds. It keeps the wound clean, dry, and protected from further injury. This provides an environment that favors wound healing.  °BEFORE YOU BEGIN °· Get your supplies together. Things you may need include: °¨ Saline solution. °¨ Flexible gauze dressing. °¨ Medicated cream. °¨ Tape. °¨ Gloves. °¨ Abdominal dressing pads. °¨ Gauze squares. °¨ Plastic bags. °· Take pain medicine 30 minutes before the dressing change if you need it. °· Take a shower before you do the first dressing change of the day. Use plastic wrap or a plastic bag to prevent the dressing from getting wet. °REMOVING YOUR OLD DRESSING  °· Wash your hands with soap and water. Dry your hands with a clean towel. °· Put on your gloves. °· Remove any tape. °· Carefully remove the old dressing. If the dressing sticks, you may dampen it with warm water to loosen it, or follow your caregiver's specific directions. °· Remove any gauze or packing tape that is in your wound. °· Take off your gloves. °· Put the gloves, tape, gauze, or any packing tape into a plastic bag. °CHANGING YOUR DRESSING °· Open the supplies. °· Take the cap off the saline solution. °· Open the gauze package so that the gauze remains on the inside of the package. °· Put on your gloves. °· Clean your wound as told by your caregiver. °· If you have been told to keep your wound dry, follow those instructions. °· Your caregiver may tell you to do one or more of the following: °¨ Pick up the gauze. Pour the saline solution over the gauze. Squeeze out the extra saline solution. °¨ Put medicated cream or other medicine on your wound if you have been told to do so. °¨ Put the solution soaked gauze only in your wound, not on the skin around it. °¨ Pack your wound loosely or as told by your caregiver. °¨ Put dry gauze on your wound. °¨ Put abdominal dressing pads over the dry gauze if your wet gauze soaks through. °· Tape the abdominal dressing pads in place so they will not  fall off. Do not wrap the tape completely around the affected part (arm, leg, abdomen). °· Wrap the dressing pads with a flexible gauze dressing to secure it in place. °· Take off your gloves. Put them in the plastic bag with the old dressing. Tie the bag shut and throw it away. °· Keep the dressing clean and dry until your next dressing change. °· Wash your hands. °SEEK MEDICAL CARE IF: °· Your skin around the wound looks red. °· Your wound feels more tender or sore. °· You see pus in the wound. °· Your wound smells bad. °· You have a fever. °· Your skin around the wound has a rash that itches and burns. °· You see black or yellow skin in your wound that was not there before. °· You feel nauseous, throw up, and feel very tired. °Document Released: 06/01/2004 Document Revised: 07/17/2011 Document Reviewed: 03/06/2011 °ExitCare® Patient Information ©2015 [employer]. This information is not intended to replace advice given to you by your health care provider. Make sure you discuss any questions you have with your health care provider. ° °

## 2014-02-06 NOTE — Discharge Summary (Signed)
Patient ID: Deanna Shea MRN: [id number] DOB/AGE: 06/15/1945 68 y.o.  Admit date: 01/27/2014 Discharge date: 02/07/2014  Procedures: Exploratory laparotomy, lysis of adhesions, small bowel resection with primary anastomosis, Appendectomy  Consults: None  Reason for Admission: This is a pleasant 68 yo white female who has a history of A fib who is controlled on digoxin and propanolol. For the last 3 weeks she has felt as if she has had a stomach virus with abdominal bloating. She denies nausea or vomiting. She denies diarrhea, but looser than normal BMs. She is normally constipated, but began having 3-4 BMs a day. This has decreased to 1-2 per day recently. After each meal, she would develop fullness and abdominal pain in the mid portion of her stomach. She has lost 7lbs in the last 3 weeks. She denies fevers, chills, weight gain, night sweats, night chills, CP, SOB, or difficulty urinating. 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 She has never had a colonoscopy. Due to worsening distention, the patient presented to [facility] today where she had a CT scan that revealed calcifications of her gallbladder, but also severe thickening of her distal to terminal ileum with small bowel dilatation c/w SBO. We have been asked to see her.  Admission Diagnoses:  1. SBO with thickened TI 2. A fib, controlled with meds 3. Anxiety/Depression 4. Calcified gallstones  Hospital Course: The patient was admitted and an NGT was placed for conservative management for her SBO.  She was placed on IVFs as well.  She had serial abdominal x-rays over the next 2 days which revealed no improvement in her obstruction.  The decision was made to proceed to the operating room where she underwent an exploratory laparotomy with SBR and appendectomy for what appeared to be perforated appendicitis with some subsequent small bowel perforations as well.  She tolerated the  procedure well.  Her NGT was left in place secondary to a postoperative ileus.  IV abx therapy was also initiated given the operative findings.  She was started on Rocephin and Flagyl and treated for a 7 day course.  Her midline incision was left open due to disease process found at the time of surgery to cut down on the change of wound infection.  An abdominal VAC was initially placed, but eventually removed and NS WD dressing changes were initiated.  Her wound remained clean through out the stay and HH was arranged at discharge for assistance with these dressing changes.    The patient had a post operative ileus for about 4 days.  Her NGT was clamped on POD 4 for active BS and minimal NGT output.  She tolerated this well and actually began passing flatus later that day.  Her NGT was then removed on POD 5 and her diet was advanced as tolerated over the next few days.  On POD 9, she was tolerating a regular diet, pain was controlled, and she was moving her bowels.  she was felt stable for discharge home at this time.  Discharge Diagnoses:  Principal Problem:   SBO (small bowel obstruction) acute perforated appendicitis with 2 small bowel perforations A. Fib Anxiety/Depression   Discharge Medications:   Medication List         alendronate 70 MG tablet  Commonly known as:  FOSAMAX  Take 70 mg by mouth every Tuesday. Take with a full glass of water on an empty stomach.     aspirin EC 81 MG tablet  Take 81 mg by mouth daily.     clonazePAM 0.5 MG tablet  Commonly known as:  KLONOPIN  Take 0.5 mg by mouth at bedtime.     digoxin 0.25 MG tablet  Commonly known as:  LANOXIN  Take 0.25 mg by mouth every other day.     escitalopram 10 MG tablet  Commonly known as:  LEXAPRO  Take 10 mg by mouth daily.     OVER THE COUNTER MEDICATION  Take 1 tablet by mouth at bedtime. Estrogen supplement     oxyCODONE-acetaminophen 5-325 MG per tablet  Commonly known as:  PERCOCET/ROXICET  Take 1-2  tablets by mouth every 4 (four) hours as needed for moderate pain.     propranolol 20 MG tablet  Commonly known as:  INDERAL  Take 20 mg by mouth 4 (four) times daily.     risperiDONE 2 MG tablet  Commonly known as:  RISPERDAL  Take 2 mg by mouth at bedtime.        Discharge Instructions:     Follow-up Information   Follow up with Maia Petties., MD On 02/27/2014. (arrive at 8:30am for paperwork for a 9:00am appointment time)    Specialty:  General Surgery   Contact information:   [address] [place] [address] [phone]       Signed: Henreitta Cea 02/06/2014, 2:45 PM

## 2014-02-06 NOTE — Progress Notes (Signed)
Advancing diet. Hope for D/C tomorrow. Patient examined and I agree with the assessment and plan  Georganna Skeans, MD, MPH, FACS Trauma: [phone] General Surgery: [phone]  02/06/2014 2:22 PM

## 2014-02-07 MED ORDER — PANTOPRAZOLE SODIUM 40 MG PO TBEC: 40.0000 mg | DELAYED_RELEASE_TABLET | Freq: Every day | ORAL | Status: DC

## 2014-02-07 MED ORDER — OXYCODONE-ACETAMINOPHEN 5-325 MG PO TABS: 1.0000 | ORAL_TABLET | ORAL | Status: DC | PRN

## 2014-02-07 NOTE — Progress Notes (Signed)
9 Days Post-Op  Subjective: Tolerating diet moving bowels  Objective: Vital signs in last 24 hours: Temp:  [97.6 F (36.4 C)-98.8 F (37.1 C)] 98.8 F (37.1 C) (10/03 0601) Pulse Rate:  [70-88] 88 (10/03 0601) Resp:  [16-18] 18 (10/03 0601) BP: (100-129)/(41-56) 129/56 mmHg (10/03 0601) SpO2:  [92 %-97 %] 92 % (10/03 0601) Last BM Date: 02/05/14  Intake/Output from previous day: 10/02 0701 - 10/03 0700 In: 680 [P.O.:680] Out: 2150 [Urine:2150] Intake/Output this shift:    Incision/Wound:soft NT ND wound clean per report  Lab Results:  No results found for this basename: WBC, HGB, HCT, PLT,  in the last 72 hours BMET  Recent Labs  02/05/14 0516  CREATININE 0.61   PT/INR No results found for this basename: LABPROT, INR,  in the last 72 hours ABG No results found for this basename: PHART, PCO2, PO2, HCO3,  in the last 72 hours  Studies/Results: No results found.  Anti-infectives: Anti-infectives   Start     Dose/Rate Route Frequency Ordered Stop   01/29/14 1600  cefTRIAXone (ROCEPHIN) 1 g in dextrose 5 % 50 mL IVPB     1 g 100 mL/hr over 30 Minutes Intravenous Every 24 hours 01/29/14 1441 02/05/14 1622   01/29/14 1600  metroNIDAZOLE (FLAGYL) IVPB 500 mg     500 mg 100 mL/hr over 60 Minutes Intravenous 3 times per day 01/29/14 1441 02/05/14 2341   01/29/14 0915  [MAR Hold]  cefOXitin (MEFOXIN) 1 g in dextrose 5 % 50 mL IVPB     (On MAR Hold since 01/29/14 0951)   1 g 100 mL/hr over 30 Minutes Intravenous  Once 01/29/14 0909 01/29/14 1030      Assessment/Plan: s/p Procedure(s): EXPLORATORY LAPAROTOMY, SMALL BOWEL RESECTION, APPENDECTOMY (N/A) Discharge  LOS: 11 days    Dreshon Proffit A. 02/07/2014

## 2014-02-07 NOTE — Progress Notes (Signed)
Patient stated that she is able to do her dressing changes at home with wet to dry NS gauze twice a day.

## 2014-10-20 DIAGNOSIS — Z79899 Other long term (current) drug therapy: Secondary | ICD-10-CM | POA: Diagnosis not present

## 2014-10-20 DIAGNOSIS — I480 Paroxysmal atrial fibrillation: Secondary | ICD-10-CM | POA: Diagnosis not present

## 2014-10-20 DIAGNOSIS — I341 Nonrheumatic mitral (valve) prolapse: Secondary | ICD-10-CM | POA: Diagnosis not present

## 2014-10-20 DIAGNOSIS — M8588 Other specified disorders of bone density and structure, other site: Secondary | ICD-10-CM | POA: Diagnosis not present

## 2014-10-20 DIAGNOSIS — Z1211 Encounter for screening for malignant neoplasm of colon: Secondary | ICD-10-CM | POA: Diagnosis not present

## 2014-10-20 DIAGNOSIS — E786 Lipoprotein deficiency: Secondary | ICD-10-CM | POA: Diagnosis not present

## 2014-10-20 DIAGNOSIS — R7309 Other abnormal glucose: Secondary | ICD-10-CM | POA: Diagnosis not present

## 2014-12-18 ENCOUNTER — Other Ambulatory Visit: Payer: Self-pay | Admitting: Gastroenterology

## 2014-12-18 DIAGNOSIS — D122 Benign neoplasm of ascending colon: Secondary | ICD-10-CM | POA: Diagnosis not present

## 2014-12-18 DIAGNOSIS — Z1211 Encounter for screening for malignant neoplasm of colon: Secondary | ICD-10-CM | POA: Diagnosis not present

## 2014-12-18 DIAGNOSIS — D124 Benign neoplasm of descending colon: Secondary | ICD-10-CM | POA: Diagnosis not present

## 2014-12-18 DIAGNOSIS — D126 Benign neoplasm of colon, unspecified: Secondary | ICD-10-CM | POA: Diagnosis not present

## 2014-12-25 ENCOUNTER — Ambulatory Visit (INDEPENDENT_AMBULATORY_CARE_PROVIDER_SITE_OTHER): Payer: Commercial Managed Care - HMO | Admitting: Interventional Cardiology

## 2014-12-25 ENCOUNTER — Encounter: Payer: Self-pay | Admitting: Interventional Cardiology

## 2014-12-25 VITALS — BP 90/64 | HR 49 | Ht 67.0 in | Wt 148.4 lb

## 2014-12-25 DIAGNOSIS — I059 Rheumatic mitral valve disease, unspecified: Secondary | ICD-10-CM

## 2014-12-25 DIAGNOSIS — I481 Persistent atrial fibrillation: Secondary | ICD-10-CM | POA: Diagnosis not present

## 2014-12-25 DIAGNOSIS — I4819 Other persistent atrial fibrillation: Secondary | ICD-10-CM

## 2014-12-25 NOTE — Patient Instructions (Signed)
Medication Instructions:  Same-no changes  Labwork: None  Testing/Procedures: None  Follow-Up: Your physician wants you to follow-up in: 1 year. You will receive a reminder letter in the mail two months in advance. If you don't receive a letter, please call our office to schedule the follow-up appointment.      

## 2014-12-25 NOTE — Progress Notes (Signed)
Patient ID: Deanna Shea, female   DOB: 03-Feb-1946, 69 y.o.   MRN: [id number]     Cardiology Office Note   Date:  12/25/2014   ID:  Deanna Shea, DOB 1945/12/22, MRN [id number]  PCP:  Antony Blackbird, MD    No chief complaint on file.  follow-up mitral valve prolapse   Wt Readings from Last 3 Encounters:  12/25/14 148 lb 6.4 oz (67.314 kg)  01/27/14 138 lb 10.7 oz (62.9 kg)  10/30/13 136 lb (61.689 kg)       History of Present Illness: Deanna Shea is a 69 y.o. female   who had palpitations since she was 30 years. She went on inderal and lanoxin and has been on that combination for many years. When they tried to take her of or change the combination, she had her sx back. The diagnosis intially was mitral valve prolapse with atrial fibrillation.  Palpitations felt like a skipped beat lasting a second. She was in [place] at the time. Most recent echocardiogram was in 5/14 showing anterior leaflet prolapse, with mild MR. She walks regularly without problems. No chest pain or SHOB. No syncope. Dizziness with standing too quickly.  Unfortunately, her husband passed away from metastatic parotid gland cancer in October 2014. She walks a little in the neighborhood, 3x/week,  without sx.  She had a ruptured appendix in 9/15 and tolerated the hospitalization well.  No cardiac issues at that time.  Dig level was 0.5 at the time.    Past Medical History  Diagnosis Date  . Mitral valve prolapse     congenital   . Anxiety   . Atrial fibrillation 10/28/2013    diagnoses in the 20's, well controlled with Digoxin and Inderal   . Palpitations 10/28/2013  . Mitral valve disorders 10/28/2013  . Kidney stones   . Allergic rhinitis   . Kidney disease, medullary sponge   . Osteopenia     dexa 4/09, improved but not resolved 2011 DEXA  . MRSA (methicillin resistant Staphylococcus aureus)     9/10  . Gall stones   . Shingles     2000  . Heart murmur   . Schizophrenia   . Depression      Past Surgical History  Procedure Laterality Date  . Elbow arthroscopy with tendon reconstruction Right 2005; 2006  . Elbow arthroscopy Left 2007  . Elbow arthroscopy Right 2008    "cyst removed; tricep"  . Knee arthroscopy Right 2004    "bone chip removed"  . Shoulder arthroscopy Right 1990's X 2  . Nasal sinus surgery  X 2    "for polyps"  . Shoulder arthroscopy w/ rotator cuff repair Right   . Knee arthroscopy Right 02/2009    "cleanup loose cartilage"  . Appendectomy  01/29/2014  . Small intestine surgery  01/29/2014  . Exploratory laparotomy  01/29/2014  . Cystoscopy w/ stone manipulation  1990's  . Laparotomy N/A 01/29/2014    Procedure: EXPLORATORY LAPAROTOMY, SMALL BOWEL RESECTION, APPENDECTOMY;  Surgeon: Donnie Mesa, MD;  Location: [facility];  Service: General;  Laterality: N/A;     Current Outpatient Prescriptions  Medication Sig Dispense Refill  . alendronate (FOSAMAX) 70 MG tablet Take 70 mg by mouth every Tuesday. Take with a full glass of water on an empty stomach.    Marland Kitchen aspirin EC 81 MG tablet Take 81 mg by mouth daily.    . clonazePAM (KLONOPIN) 0.5 MG tablet Take 0.5 mg by mouth at bedtime.     Marland Kitchen  digoxin (LANOXIN) 0.25 MG tablet Take 0.25 mg by mouth every other day.     . escitalopram (LEXAPRO) 10 MG tablet Take 10 mg by mouth daily.    Marland Kitchen OVER THE COUNTER MEDICATION Take 1 tablet by mouth at bedtime. Estrogen supplement    . propranolol (INDERAL) 20 MG tablet Take 20 mg by mouth 4 (four) times daily.    . risperiDONE (RISPERDAL) 2 MG tablet Take 2 mg by mouth at bedtime.     No current facility-administered medications for this visit.    Allergies:   Sulfa antibiotics    Social History:  The patient  reports that she has never smoked. She has never used smokeless tobacco. She reports that she does not drink alcohol or use illicit drugs.   Family History:  [name] family history includes Arthritis in her father; Cataracts in her father; Dementia in her  father; Diabetes Mellitus II in her mother; Hypertension in her mother.    ROS:  Please see the history of present illness.   Otherwise, review of systems are positive for ruptured appendix in 9/15.  No syncope or lightheadedness..   All other systems are reviewed and negative.    PHYSICAL EXAM: VS:  BP 90/64 mmHg  Pulse 49  Ht 5\' 7"  (1.702 m)  Wt 148 lb 6.4 oz (67.314 kg)  BMI 23.24 kg/m2  SpO2 95% , BMI Body mass index is 23.24 kg/(m^2). GEN: Well nourished, well developed, in no acute distress HEENT: normal Neck: no JVD, carotid bruits, or masses Cardiac: bradycardia; no murmurs, rubs, or gallops,no edema  Respiratory:  clear to auscultation bilaterally, normal work of breathing GI: soft, nontender, nondistended, + BS MS: no deformity or atrophy Skin: warm and dry, no rash Neuro:  Strength and sensation are intact Psych: euthymic mood, full affect   EKG:   The ekg ordered today demonstrates sinus bradycardia no ST segment changes   Recent Labs: 01/27/2014: ALT 22 02/01/2014: BUN 4*; Hemoglobin 12.5; Platelets 300; Potassium 3.9; Sodium 136* 02/05/2014: Creatinine, Ser 0.61   Lipid Panel No results found for: CHOL, TRIG, HDL, CHOLHDL, VLDL, LDLCALC, LDLDIRECT   Other studies Reviewed: Additional studies/ records that were reviewed today with results demonstrating: 2014 echo as above.   ASSESSMENT AND PLAN:  1. Mitral valve prolapse: Echocardiogram from 2014 showed mild, posteriorly directed mitral regurgitation. No signs of CHF at this time. No change in murmur. No need for repeat echo at this time.  2. Atrial fibrillation: She carries this diagnosis for many years ago. She has never had it documented on a strip that is available to [place]. We have continued aspirin rather than using any higher level of anticoagulation. She has preferred to avoid Coumadin or stronger anticoagulation. 3.    Current medicines are reviewed at length with the patient today.  The patient  concerns regarding her medicines were addressed.  The following changes have been made:  No change  Labs/ tests ordered today include:  No orders of the defined types were placed in this encounter.    Recommend 150 minutes/week of aerobic exercise Low fat, low carb, high fiber diet recommended  Disposition:   FU in one year   Teresita Madura., MD  12/25/2014 4:46 PM    [facility] [place], [place], [place]  [address] Phone: [phone]; Fax: [phone]

## 2015-03-03 ENCOUNTER — Other Ambulatory Visit: Payer: Self-pay

## 2015-03-03 DIAGNOSIS — Z1231 Encounter for screening mammogram for malignant neoplasm of breast: Secondary | ICD-10-CM

## 2015-03-17 ENCOUNTER — Ambulatory Visit
Admission: RE | Admit: 2015-03-17 | Discharge: 2015-03-17 | Disposition: A | Discharge status: Home / Self Care | Payer: Commercial Managed Care - HMO | Source: Ambulatory Visit

## 2015-03-17 DIAGNOSIS — Z1231 Encounter for screening mammogram for malignant neoplasm of breast: Secondary | ICD-10-CM

## 2015-06-07 DIAGNOSIS — R5383 Other fatigue: Secondary | ICD-10-CM | POA: Diagnosis not present

## 2015-06-07 DIAGNOSIS — I059 Rheumatic mitral valve disease, unspecified: Secondary | ICD-10-CM | POA: Diagnosis not present

## 2015-06-07 DIAGNOSIS — M859 Disorder of bone density and structure, unspecified: Secondary | ICD-10-CM | POA: Diagnosis not present

## 2015-06-07 DIAGNOSIS — Z7901 Long term (current) use of anticoagulants: Secondary | ICD-10-CM | POA: Diagnosis not present

## 2015-06-07 DIAGNOSIS — Z23 Encounter for immunization: Secondary | ICD-10-CM | POA: Diagnosis not present

## 2015-06-07 DIAGNOSIS — Z13220 Encounter for screening for lipoid disorders: Secondary | ICD-10-CM | POA: Diagnosis not present

## 2015-06-07 DIAGNOSIS — E786 Lipoprotein deficiency: Secondary | ICD-10-CM | POA: Diagnosis not present

## 2015-06-07 DIAGNOSIS — Z79899 Other long term (current) drug therapy: Secondary | ICD-10-CM | POA: Diagnosis not present

## 2015-06-07 DIAGNOSIS — I4891 Unspecified atrial fibrillation: Secondary | ICD-10-CM | POA: Diagnosis not present

## 2015-06-24 DIAGNOSIS — R635 Abnormal weight gain: Secondary | ICD-10-CM | POA: Diagnosis not present

## 2015-06-24 DIAGNOSIS — E119 Type 2 diabetes mellitus without complications: Secondary | ICD-10-CM | POA: Diagnosis not present

## 2015-07-20 ENCOUNTER — Ambulatory Visit: Payer: Self-pay

## 2015-07-27 ENCOUNTER — Ambulatory Visit: Payer: Self-pay

## 2015-08-03 ENCOUNTER — Ambulatory Visit: Payer: Self-pay

## 2015-08-10 ENCOUNTER — Encounter: Payer: Commercial Managed Care - HMO | Attending: Endocrinology

## 2015-08-10 VITALS — Ht 67.0 in | Wt 158.6 lb

## 2015-08-10 DIAGNOSIS — E119 Type 2 diabetes mellitus without complications: Secondary | ICD-10-CM | POA: Diagnosis not present

## 2015-08-17 DIAGNOSIS — L03211 Cellulitis of face: Secondary | ICD-10-CM | POA: Diagnosis not present

## 2015-08-17 DIAGNOSIS — E119 Type 2 diabetes mellitus without complications: Secondary | ICD-10-CM

## 2015-08-17 DIAGNOSIS — H00016 Hordeolum externum left eye, unspecified eyelid: Secondary | ICD-10-CM | POA: Diagnosis not present

## 2015-08-17 NOTE — Progress Notes (Signed)

## 2015-08-18 DIAGNOSIS — L03211 Cellulitis of face: Secondary | ICD-10-CM | POA: Diagnosis not present

## 2015-08-18 DIAGNOSIS — H00016 Hordeolum externum left eye, unspecified eyelid: Secondary | ICD-10-CM | POA: Diagnosis not present

## 2015-08-22 NOTE — Progress Notes (Signed)

## 2015-08-24 DIAGNOSIS — E119 Type 2 diabetes mellitus without complications: Secondary | ICD-10-CM | POA: Diagnosis not present

## 2015-08-24 NOTE — Progress Notes (Signed)
Patient was seen on 08/24/15 for the third of a series of three diabetes self-management courses at the [facility].   Catalina Gravel the amount of activity recommended for healthy living . Describe activities suitable for individual needs . Identify ways to regularly incorporate activity into daily life . Identify barriers to activity and ways to over come these barriers  Identify diabetes medications being personally used and their primary action for lowering glucose and possible side effects . Describe role of stress on blood glucose and develop strategies to address psychosocial issues . Identify diabetes complications and ways to prevent them  Explain how to manage diabetes during illness . Evaluate success in meeting personal goal . Establish 2-3 goals that they will plan to diligently work on until they return for the  50-month follow-up visit  Goals:   I will count my carb choices at most meals and snacks  I will be active 30 minutes or more 5 times a week  Your patient has identified these potential barriers to change:  Stress  Your patient has identified their diabetes self-care support plan as  On-line Resources Plan:  Attend Support Group as desired

## 2015-10-26 DIAGNOSIS — R635 Abnormal weight gain: Secondary | ICD-10-CM | POA: Diagnosis not present

## 2015-10-26 DIAGNOSIS — E119 Type 2 diabetes mellitus without complications: Secondary | ICD-10-CM | POA: Diagnosis not present

## 2015-12-29 DIAGNOSIS — D235 Other benign neoplasm of skin of trunk: Secondary | ICD-10-CM | POA: Diagnosis not present

## 2015-12-29 DIAGNOSIS — L814 Other melanin hyperpigmentation: Secondary | ICD-10-CM | POA: Diagnosis not present

## 2015-12-29 DIAGNOSIS — L821 Other seborrheic keratosis: Secondary | ICD-10-CM | POA: Diagnosis not present

## 2015-12-29 DIAGNOSIS — D1801 Hemangioma of skin and subcutaneous tissue: Secondary | ICD-10-CM | POA: Diagnosis not present

## 2015-12-30 ENCOUNTER — Encounter: Payer: Self-pay | Admitting: Interventional Cardiology

## 2015-12-30 ENCOUNTER — Ambulatory Visit (INDEPENDENT_AMBULATORY_CARE_PROVIDER_SITE_OTHER): Payer: Commercial Managed Care - HMO | Admitting: Interventional Cardiology

## 2015-12-30 VITALS — BP 120/60 | HR 51 | Ht 67.0 in | Wt 161.0 lb

## 2015-12-30 DIAGNOSIS — I480 Paroxysmal atrial fibrillation: Secondary | ICD-10-CM

## 2015-12-30 DIAGNOSIS — Z79899 Other long term (current) drug therapy: Secondary | ICD-10-CM

## 2015-12-30 DIAGNOSIS — E119 Type 2 diabetes mellitus without complications: Secondary | ICD-10-CM

## 2015-12-30 DIAGNOSIS — R002 Palpitations: Secondary | ICD-10-CM | POA: Diagnosis not present

## 2015-12-30 DIAGNOSIS — I059 Rheumatic mitral valve disease, unspecified: Secondary | ICD-10-CM | POA: Diagnosis not present

## 2015-12-30 DIAGNOSIS — I481 Persistent atrial fibrillation: Secondary | ICD-10-CM | POA: Diagnosis not present

## 2015-12-30 LAB — BASIC METABOLIC PANEL
BUN: 17 mg/dL (ref 7–25)
CALCIUM: 9.2 mg/dL (ref 8.6–10.4)
CO2: 27 mmol/L (ref 20–31)
CREATININE: 0.89 mg/dL (ref 0.50–0.99)
Chloride: 102 mmol/L (ref 98–110)
Glucose, Bld: 185 mg/dL — ABNORMAL HIGH (ref 65–99)
Potassium: 4.2 mmol/L (ref 3.5–5.3)
Sodium: 143 mmol/L (ref 135–146)

## 2015-12-30 NOTE — Patient Instructions (Signed)
Medication Instructions:  Same-no changes  Labwork: Today-Dig level and BMET  Testing/Procedures: None  Follow-Up: Your physician wants you to follow-up in: 1 year. You will receive a reminder letter in the mail two months in advance. If you don't receive a letter, please call our office to schedule the follow-up appointment.     If you need a refill on your cardiac medications before your next appointment, please call your pharmacy.

## 2015-12-30 NOTE — Progress Notes (Signed)
Patient ID: Deanna Shea, female   DOB: 1945/05/23, 70 y.o.   MRN: [id number]     Cardiology Office Note   Date:  12/30/2015   ID:  Deanna Shea, DOB 04/13/1946, MRN [id number]  PCP:  Antony Blackbird, MD    No chief complaint on file.  follow-up mitral valve prolapse   Wt Readings from Last 3 Encounters:  12/30/15 161 lb (73 kg)  08/22/15 158 lb 9.6 oz (71.9 kg)  12/25/14 148 lb 6.4 oz (67.3 kg)       History of Present Illness: Deanna Shea is a 70 y.o. female   who had palpitations since she was 30 years. She went on inderal and lanoxin and has been on that combination for many years. When they tried to take her of or change the combination, she had her sx back. The diagnosis intially was mitral valve prolapse with atrial fibrillation.  Palpitations felt like a skipped beat lasting a second. She was in [place] at the time. Most recent echocardiogram was in 5/14 showing anterior leaflet prolapse, with mild MR. She walks regularly without problems. No chest pain or SHOB. No syncope. Dizziness with standing too quickly.  Unfortunately, her husband passed away from metastatic parotid gland cancer in October 2014. She walks a little in the neighborhood, 3x/week,  without sx.  She had a ruptured appendix in 9/15 and tolerated the hospitalization well.  No cardiac issues at that time.  Dig level was 0.5 at the time.  She was diagnosed with diabetes in May 2017.  She is trying to control this with diet.  She exercises daily, for 25-30 minutes.  No palpitations and no bleeding issues on aspirin.  No problems with CP, SHOB or palpitations during exercise.      Past Medical History:  Diagnosis Date  . Allergic rhinitis   . Anxiety   . Atrial fibrillation ([facility]) 10/28/2013   diagnoses in the 20's, well controlled with Digoxin and Inderal   . Depression   . Diabetes mellitus without complication ([facility])   . Gall stones   . Heart murmur   . Kidney disease, medullary sponge   . Kidney  stones   . Mitral valve disorders 10/28/2013  . Mitral valve prolapse    congenital   . MRSA (methicillin resistant Staphylococcus aureus)    9/10  . Osteopenia    dexa 4/09, improved but not resolved 2011 DEXA  . Palpitations 10/28/2013  . Schizophrenia ([facility])   . Shingles    2000    Past Surgical History:  Procedure Laterality Date  . APPENDECTOMY  01/29/2014  . CYSTOSCOPY W/ STONE MANIPULATION  1990's  . ELBOW ARTHROSCOPY Left 2007  . ELBOW ARTHROSCOPY Right 2008   "cyst removed; tricep"  . ELBOW ARTHROSCOPY WITH TENDON RECONSTRUCTION Right 2005; 2006  . EXPLORATORY LAPAROTOMY  01/29/2014  . KNEE ARTHROSCOPY Right 2004   "bone chip removed"  . KNEE ARTHROSCOPY Right 02/2009   "cleanup loose cartilage"  . LAPAROTOMY N/A 01/29/2014   Procedure: EXPLORATORY LAPAROTOMY, SMALL BOWEL RESECTION, APPENDECTOMY;  Surgeon: Donnie Mesa, MD;  Location: [facility];  Service: General;  Laterality: N/A;  . NASAL SINUS SURGERY  X 2   "for polyps"  . SHOULDER ARTHROSCOPY Right 1990's X 2  . SHOULDER ARTHROSCOPY W/ ROTATOR CUFF REPAIR Right   . SMALL INTESTINE SURGERY  01/29/2014     Current Outpatient Prescriptions  Medication Sig Dispense Refill  . alendronate (FOSAMAX) 70 MG tablet Take 70 mg by mouth every Tuesday. Take  with a full glass of water on an empty stomach.    Marland Kitchen aspirin EC 81 MG tablet Take 81 mg by mouth daily.    . clonazePAM (KLONOPIN) 0.5 MG tablet Take 0.5 mg by mouth at bedtime.     . digoxin (LANOXIN) 0.25 MG tablet Take 0.25 mg by mouth every other day.     . escitalopram (LEXAPRO) 20 MG tablet Take 20 mg by mouth daily.     Marland Kitchen OVER THE COUNTER MEDICATION Take 1 tablet by mouth at bedtime. Estrogen supplement    . propranolol (INDERAL) 20 MG tablet Take 20 mg by mouth 4 (four) times daily.    . risperiDONE (RISPERDAL) 2 MG tablet Take 2 mg by mouth at bedtime.     No current facility-administered medications for this visit.     Allergies:   Sulfa antibiotics    Social  History:  The patient  reports that she has never smoked. She has never used smokeless tobacco. She reports that she does not drink alcohol or use drugs.   Family History:  [name] family history includes Arthritis in her father; Cataracts in her father; Dementia in her father; Diabetes Mellitus II in her mother; Hypertension in her mother.    ROS:  Please see the history of present illness.   Otherwise, review of systems are positive for ruptured appendix in 9/15.  No syncope or lightheadedness..   All other systems are reviewed and negative.    PHYSICAL EXAM: VS:  BP 120/60   Pulse (!) 51   Ht 5\' 7"  (1.702 m)   Wt 161 lb (73 kg)   BMI 25.22 kg/m  , BMI Body mass index is 25.22 kg/m. GEN: Well nourished, well developed, in no acute distress  HEENT: normal  Neck: no JVD, carotid bruits, or masses Cardiac: bradycardia; no murmur, rubs, or gallops,no edema  Respiratory:  clear to auscultation bilaterally, normal work of breathing GI: soft, nontender, nondistended, + BS MS: no deformity or atrophy  Skin: warm and dry, no rash Neuro:  Strength and sensation are intact Psych: euthymic mood, full affect   EKG:   The ekg ordered today demonstrates sinus bradycardia no ST segment changes   Recent Labs: No results found for requested labs within last 8760 hours.   Lipid Panel No results found for: CHOL, TRIG, HDL, CHOLHDL, VLDL, LDLCALC, LDLDIRECT   Other studies Reviewed: Additional studies/ records that were reviewed today with results demonstrating: 2014 echo as above.   ASSESSMENT AND PLAN:  1. Mitral valve prolapse: Echocardiogram from 2014 showed mild, posteriorly directed mitral regurgitation. No signs of CHF at this time. No change in exam. No need for repeat echo at this time.  2. Atrial fibrillation: She carries this diagnosis for many years ago. She has never had it documented on a strip that is available to [place]. We have continued aspirin rather than using any  higher level of anticoagulation. She has preferred to avoid Coumadin or stronger anticoagulation.  Check renal function and digoxin level.  3. Diet controlled diabetes: We stressed the importance of lifestyle modifications to help keep blood sugars We talked about dietary changes and exercise as well. Avoiding simple sugars including white bread, white rice.  Lipids followed by primary care doctor. She has not had high cholesterol in the past.   Current medicines are reviewed at length with the patient today.  The patient concerns regarding her medicines were addressed.  The following changes have been made:  No change  Labs/ tests ordered today include:  No orders of the defined types were placed in this encounter.   Recommend 150 minutes/week of aerobic exercise Low fat, low carb, high fiber diet recommended  Disposition:   FU in one year   Signed, Larae Grooms, MD  12/30/2015 8:42 AM    [facility] [place], [place], [place]  [address] Phone: [phone]; Fax: [phone]

## 2015-12-31 LAB — DIGOXIN LEVEL: DIGOXIN LVL: 0.6 ug/L — AB (ref 0.8–2.0)

## 2016-01-05 DIAGNOSIS — E119 Type 2 diabetes mellitus without complications: Secondary | ICD-10-CM | POA: Diagnosis not present

## 2016-01-05 DIAGNOSIS — E02 Subclinical iodine-deficiency hypothyroidism: Secondary | ICD-10-CM | POA: Diagnosis not present

## 2016-03-24 ENCOUNTER — Other Ambulatory Visit: Payer: Self-pay | Admitting: Family

## 2016-03-24 DIAGNOSIS — Z1231 Encounter for screening mammogram for malignant neoplasm of breast: Secondary | ICD-10-CM

## 2016-04-12 DIAGNOSIS — E2839 Other primary ovarian failure: Secondary | ICD-10-CM | POA: Diagnosis not present

## 2016-04-12 DIAGNOSIS — M8580 Other specified disorders of bone density and structure, unspecified site: Secondary | ICD-10-CM | POA: Diagnosis not present

## 2016-04-12 DIAGNOSIS — Z23 Encounter for immunization: Secondary | ICD-10-CM | POA: Diagnosis not present

## 2016-04-14 ENCOUNTER — Ambulatory Visit: Payer: Commercial Managed Care - HMO

## 2016-05-10 ENCOUNTER — Ambulatory Visit
Admission: RE | Admit: 2016-05-10 | Discharge: 2016-05-10 | Disposition: A | Discharge status: Home / Self Care | Payer: Commercial Managed Care - HMO | Source: Ambulatory Visit | Attending: Family | Admitting: Family

## 2016-05-10 DIAGNOSIS — Z1231 Encounter for screening mammogram for malignant neoplasm of breast: Secondary | ICD-10-CM | POA: Diagnosis not present

## 2016-07-05 DIAGNOSIS — E119 Type 2 diabetes mellitus without complications: Secondary | ICD-10-CM | POA: Diagnosis not present

## 2016-07-05 DIAGNOSIS — E02 Subclinical iodine-deficiency hypothyroidism: Secondary | ICD-10-CM | POA: Diagnosis not present

## 2016-07-12 DIAGNOSIS — E119 Type 2 diabetes mellitus without complications: Secondary | ICD-10-CM | POA: Diagnosis not present

## 2016-07-12 DIAGNOSIS — E02 Subclinical iodine-deficiency hypothyroidism: Secondary | ICD-10-CM | POA: Diagnosis not present

## 2016-09-11 DIAGNOSIS — E02 Subclinical iodine-deficiency hypothyroidism: Secondary | ICD-10-CM | POA: Diagnosis not present

## 2016-10-24 DIAGNOSIS — L03115 Cellulitis of right lower limb: Secondary | ICD-10-CM | POA: Diagnosis not present

## 2016-10-24 DIAGNOSIS — Z22322 Carrier or suspected carrier of Methicillin resistant Staphylococcus aureus: Secondary | ICD-10-CM | POA: Diagnosis not present

## 2016-10-31 DIAGNOSIS — L03115 Cellulitis of right lower limb: Secondary | ICD-10-CM | POA: Diagnosis not present

## 2016-10-31 DIAGNOSIS — Z22322 Carrier or suspected carrier of Methicillin resistant Staphylococcus aureus: Secondary | ICD-10-CM | POA: Diagnosis not present

## 2016-11-06 DIAGNOSIS — Z13220 Encounter for screening for lipoid disorders: Secondary | ICD-10-CM | POA: Diagnosis not present

## 2016-11-06 DIAGNOSIS — E039 Hypothyroidism, unspecified: Secondary | ICD-10-CM | POA: Diagnosis not present

## 2016-11-06 DIAGNOSIS — L989 Disorder of the skin and subcutaneous tissue, unspecified: Secondary | ICD-10-CM | POA: Diagnosis not present

## 2016-11-06 DIAGNOSIS — M8580 Other specified disorders of bone density and structure, unspecified site: Secondary | ICD-10-CM | POA: Diagnosis not present

## 2016-11-06 DIAGNOSIS — I4891 Unspecified atrial fibrillation: Secondary | ICD-10-CM | POA: Diagnosis not present

## 2016-11-06 DIAGNOSIS — R7303 Prediabetes: Secondary | ICD-10-CM | POA: Diagnosis not present

## 2016-11-07 ENCOUNTER — Other Ambulatory Visit: Payer: Self-pay | Admitting: Family Medicine

## 2016-11-07 DIAGNOSIS — L03115 Cellulitis of right lower limb: Secondary | ICD-10-CM | POA: Diagnosis not present

## 2016-11-07 DIAGNOSIS — M8580 Other specified disorders of bone density and structure, unspecified site: Secondary | ICD-10-CM

## 2016-11-07 DIAGNOSIS — L980 Pyogenic granuloma: Secondary | ICD-10-CM | POA: Diagnosis not present

## 2016-11-16 DIAGNOSIS — C44722 Squamous cell carcinoma of skin of right lower limb, including hip: Secondary | ICD-10-CM | POA: Diagnosis not present

## 2016-11-17 ENCOUNTER — Ambulatory Visit
Admission: RE | Admit: 2016-11-17 | Discharge: 2016-11-17 | Disposition: A | Discharge status: Home / Self Care | Payer: Commercial Managed Care - HMO | Source: Ambulatory Visit | Attending: Family Medicine | Admitting: Family Medicine

## 2016-11-17 DIAGNOSIS — Z780 Asymptomatic menopausal state: Secondary | ICD-10-CM | POA: Diagnosis not present

## 2016-11-17 DIAGNOSIS — Z13820 Encounter for screening for osteoporosis: Secondary | ICD-10-CM | POA: Diagnosis not present

## 2016-11-17 DIAGNOSIS — M8580 Other specified disorders of bone density and structure, unspecified site: Secondary | ICD-10-CM

## 2016-11-21 DIAGNOSIS — R7301 Impaired fasting glucose: Secondary | ICD-10-CM | POA: Diagnosis not present

## 2016-12-28 DIAGNOSIS — Z48817 Encounter for surgical aftercare following surgery on the skin and subcutaneous tissue: Secondary | ICD-10-CM | POA: Diagnosis not present

## 2016-12-28 DIAGNOSIS — Z85828 Personal history of other malignant neoplasm of skin: Secondary | ICD-10-CM | POA: Diagnosis not present

## 2017-05-07 DIAGNOSIS — H5210 Myopia, unspecified eye: Secondary | ICD-10-CM | POA: Diagnosis not present

## 2017-05-07 DIAGNOSIS — Z0100 Encounter for examination of eyes and vision without abnormal findings: Secondary | ICD-10-CM | POA: Diagnosis not present

## 2017-05-14 ENCOUNTER — Other Ambulatory Visit (HOSPITAL_COMMUNITY)
Admission: RE | Admit: 2017-05-14 | Discharge: 2017-05-14 | Disposition: A | Discharge status: Home / Self Care | Payer: Medicare HMO | Source: Ambulatory Visit | Attending: Family Medicine | Admitting: Family Medicine

## 2017-05-14 ENCOUNTER — Other Ambulatory Visit: Payer: Self-pay | Admitting: Family Medicine

## 2017-05-14 DIAGNOSIS — Z23 Encounter for immunization: Secondary | ICD-10-CM | POA: Diagnosis not present

## 2017-05-14 DIAGNOSIS — Z01411 Encounter for gynecological examination (general) (routine) with abnormal findings: Secondary | ICD-10-CM | POA: Insufficient documentation

## 2017-05-14 DIAGNOSIS — R7303 Prediabetes: Secondary | ICD-10-CM | POA: Diagnosis not present

## 2017-05-14 DIAGNOSIS — Z0001 Encounter for general adult medical examination with abnormal findings: Secondary | ICD-10-CM | POA: Diagnosis not present

## 2017-05-14 DIAGNOSIS — Z79899 Other long term (current) drug therapy: Secondary | ICD-10-CM | POA: Diagnosis not present

## 2017-05-14 DIAGNOSIS — Z7901 Long term (current) use of anticoagulants: Secondary | ICD-10-CM | POA: Diagnosis not present

## 2017-05-14 DIAGNOSIS — E039 Hypothyroidism, unspecified: Secondary | ICD-10-CM | POA: Diagnosis not present

## 2017-05-14 DIAGNOSIS — E7800 Pure hypercholesterolemia, unspecified: Secondary | ICD-10-CM | POA: Diagnosis not present

## 2017-05-14 DIAGNOSIS — I4891 Unspecified atrial fibrillation: Secondary | ICD-10-CM | POA: Diagnosis not present

## 2017-05-17 LAB — CYTOLOGY - PAP
DIAGNOSIS: NEGATIVE
HPV: NOT DETECTED

## 2017-05-23 DIAGNOSIS — Z85828 Personal history of other malignant neoplasm of skin: Secondary | ICD-10-CM | POA: Diagnosis not present

## 2017-05-23 DIAGNOSIS — L905 Scar conditions and fibrosis of skin: Secondary | ICD-10-CM | POA: Diagnosis not present

## 2017-05-28 NOTE — Progress Notes (Signed)
Cardiology Office Note   Date:  05/29/2017   ID:  Deanna Shea, DOB [birth year], MRN [id number]  PCP:  Deanna Amel, MD    No chief complaint on file. MVP   Wt Readings from Last 3 Encounters:  05/29/17 171 lb (77.6 kg)  12/30/15 161 lb (73 kg)  08/22/15 158 lb 9.6 oz (71.9 kg)       History of Present Illness: Deanna Shea is a 72 y.o. female   who had palpitations since she was 30 years. She went on inderal and lanoxin and has been on that combination for many years. When they tried to take her of or change the combination, she had her sx back. The diagnosis intially was mitral valve prolapse with atrial fibrillation.  Palpitations felt like a skipped beat lasting a second. She was in [place] at the time. Most recent echocardiogram was in 5/14 showing anterior leaflet prolapse, with mild MR.   Unfortunately, her husband passed away from metastatic parotid gland cancer in October 2014.  She had a ruptured appendix in 9/15 and tolerated the hospitalization well.  No cardiac issues at that time.  Dig level was 0.5 at the time.  She was diagnosed with diabetes in May 2017.  She started medication for the DM in Jan 2019.    Denies : Chest pain. Dizziness. Leg edema. Nitroglycerin use. Orthopnea. Palpitations. Paroxysmal nocturnal dyspnea. Shortness of breath. Syncope.   She does exercise, walking and floor exercises, when the weather cooperates.  No problems with that activity.  Past Medical History:  Diagnosis Date  . Allergic rhinitis   . Anxiety   . Atrial fibrillation ([facility]) 10/28/2013   diagnoses in the 20's, well controlled with Digoxin and Inderal   . Depression   . Diabetes mellitus without complication ([facility])   . Gall stones   . Heart murmur   . Kidney disease, medullary sponge   . Kidney stones   . Mitral valve disorders(424.0) 10/28/2013  . Mitral valve prolapse    congenital   . MRSA (methicillin resistant Staphylococcus aureus)    9/10  .  Osteopenia    dexa 4/09, improved but not resolved 2011 DEXA  . Palpitations 10/28/2013  . Schizophrenia ([facility])   . Shingles    2000    Past Surgical History:  Procedure Laterality Date  . APPENDECTOMY  01/29/2014  . CYSTOSCOPY W/ STONE MANIPULATION  1990's  . ELBOW ARTHROSCOPY Left 2007  . ELBOW ARTHROSCOPY Right 2008   "cyst removed; tricep"  . ELBOW ARTHROSCOPY WITH TENDON RECONSTRUCTION Right 2005; 2006  . EXPLORATORY LAPAROTOMY  01/29/2014  . KNEE ARTHROSCOPY Right 2004   "bone chip removed"  . KNEE ARTHROSCOPY Right 02/2009   "cleanup loose cartilage"  . LAPAROTOMY N/A 01/29/2014   Procedure: EXPLORATORY LAPAROTOMY, SMALL BOWEL RESECTION, APPENDECTOMY;  Surgeon: Donnie Mesa, MD;  Location: [facility];  Service: General;  Laterality: N/A;  . NASAL SINUS SURGERY  X 2   "for polyps"  . SHOULDER ARTHROSCOPY Right 1990's X 2  . SHOULDER ARTHROSCOPY W/ ROTATOR CUFF REPAIR Right   . SMALL INTESTINE SURGERY  01/29/2014     Current Outpatient Medications  Medication Sig Dispense Refill  . aspirin EC 81 MG tablet Take 81 mg by mouth daily.    . clonazePAM (KLONOPIN) 0.5 MG tablet Take 0.5 mg by mouth at bedtime.     . digoxin (LANOXIN) 0.25 MG tablet Take 0.25 mg by mouth every other day.     . escitalopram (LEXAPRO)  20 MG tablet Take 20 mg by mouth daily.     Marland Kitchen levothyroxine (SYNTHROID, LEVOTHROID) 50 MCG tablet Take 50 mcg by mouth daily.  11  . metFORMIN (GLUCOPHAGE) 500 MG tablet Take 1 tablet by mouth 2 (two) times daily.  3  . OVER THE COUNTER MEDICATION Take 1 tablet by mouth at bedtime. Estrogen supplement    . propranolol (INDERAL) 20 MG tablet Take 20 mg by mouth 4 (four) times daily.    . risperiDONE (RISPERDAL) 2 MG tablet Take 2 mg by mouth at bedtime.     No current facility-administered medications for this visit.     Allergies:   Sulfa antibiotics    Social History:  The patient  reports that  has never smoked. she has never used smokeless tobacco. She reports that  she does not drink alcohol or use drugs.   Family History:  [name] family history includes Arthritis in her father; Cataracts in her father; Dementia in her father; Diabetes Mellitus II in her mother; Hypertension in her mother.    ROS:  Please see the history of present illness.   Otherwise, review of systems are positive for weight gain.   All other systems are reviewed and negative.    PHYSICAL EXAM: VS:  BP 124/68   Pulse (!) 50   Ht 5\' 7"  (1.702 m)   Wt 171 lb (77.6 kg)   SpO2 95%   BMI 26.78 kg/m  , BMI Body mass index is 26.78 kg/m. GEN: Well nourished, well developed, in no acute distress  HEENT: normal  Neck: no JVD, carotid bruits, or masses Cardiac: RRR; no murmurs, rubs, or gallops,no edema  Respiratory:  clear to auscultation bilaterally, normal work of breathing GI: soft, nontender, nondistended, + BS MS: no deformity or atrophy  Skin: warm and dry, no rash Neuro:  Strength and sensation are intact Psych: euthymic mood, flat affect   EKG:   The ekg ordered today demonstrates sinus bradycardia, prolonged PR, no ST changes   Recent Labs: No results found for requested labs within last 8760 hours.   Lipid Panel No results found for: CHOL, TRIG, HDL, CHOLHDL, VLDL, LDLCALC, LDLDIRECT   Other studies Reviewed: Additional studies/ records that were reviewed today with results demonstrating: 2014 echo result reviewed.   ASSESSMENT AND PLAN:  1. Mitral valve prolapse:  No change by exam.   COnsider echo if sx change, currently, will hold off. 2. AFib: She has had this diagnosis for years.  I have not seen a strip showing AFib un the past. We have continued aspirin.  She has wanted to avoid stronger anticoagulation.  No bleeding problems on aspirin.  Check Dig level.  Watch bradycardia.  May need to decrease propranolol dose. 3. Diabetes mellitus: LDL target < 100 given blood sugar issues.  Followed by PMD.  Managed with diet and now medications.  Weight  gain over the past few years.     Current medicines are reviewed at length with the patient today.  The patient concerns regarding her medicines were addressed.  The following changes have been made:  No change  Labs/ tests ordered today include:  No orders of the defined types were placed in this encounter.   Recommend 150 minutes/week of aerobic exercise Low fat, low carb, high fiber diet recommended  Disposition:   FU in 1 year   Signed, Larae Grooms, MD  05/29/2017 9:19 AM    [facility] [place],  [place]  [address] Phone: [phone]; Fax: [phone]

## 2017-05-29 ENCOUNTER — Ambulatory Visit: Payer: Medicare HMO | Admitting: Interventional Cardiology

## 2017-05-29 ENCOUNTER — Encounter: Payer: Self-pay | Admitting: Interventional Cardiology

## 2017-05-29 ENCOUNTER — Encounter (INDEPENDENT_AMBULATORY_CARE_PROVIDER_SITE_OTHER): Payer: Self-pay

## 2017-05-29 VITALS — BP 124/68 | HR 50 | Ht 67.0 in | Wt 171.0 lb

## 2017-05-29 DIAGNOSIS — I059 Rheumatic mitral valve disease, unspecified: Secondary | ICD-10-CM | POA: Diagnosis not present

## 2017-05-29 DIAGNOSIS — I480 Paroxysmal atrial fibrillation: Secondary | ICD-10-CM | POA: Diagnosis not present

## 2017-05-29 DIAGNOSIS — E119 Type 2 diabetes mellitus without complications: Secondary | ICD-10-CM

## 2017-05-29 LAB — DIGOXIN LEVEL: Digoxin, Serum: 1.3 ng/mL — ABNORMAL HIGH (ref 0.5–0.9)

## 2017-05-29 MED ORDER — DIGOXIN 250 MCG PO TABS: 0.2500 mg | ORAL_TABLET | ORAL | 3 refills | Status: DC

## 2017-05-29 MED ORDER — PROPRANOLOL HCL 20 MG PO TABS: 20.0000 mg | ORAL_TABLET | Freq: Four times a day (QID) | ORAL | 3 refills | Status: DC

## 2017-05-29 NOTE — Patient Instructions (Signed)
Medication Instructions:  Your physician recommends that you continue on your current medications as directed. Please refer to the Current Medication list given to you today.   Labwork: TODAY: Digoxin level  Testing/Procedures: None ordered  Follow-Up: Your physician wants you to follow-up in: 1 year with Dr. Irish Lack. You will receive a reminder letter in the mail two months in advance. If you don't receive a letter, please call our office to schedule the follow-up appointment.   Any Other Special Instructions Will Be Listed Below (If Applicable).     If you need a refill on your cardiac medications before your next appointment, please call your pharmacy.

## 2017-06-20 ENCOUNTER — Encounter: Payer: Self-pay | Admitting: Registered"

## 2017-06-20 ENCOUNTER — Encounter: Payer: Medicare HMO | Attending: Family Medicine | Admitting: Registered"

## 2017-06-20 DIAGNOSIS — E119 Type 2 diabetes mellitus without complications: Secondary | ICD-10-CM | POA: Insufficient documentation

## 2017-06-20 DIAGNOSIS — Z713 Dietary counseling and surveillance: Secondary | ICD-10-CM | POA: Insufficient documentation

## 2017-06-20 NOTE — Patient Instructions (Signed)
Plan:  Aim for 2-3 Carb Choices per meal (30-45 grams)   Aim for 0-1 Carb Choices per snack if hungry (0-15 grams) Include protein with your meals and snacks Aim for 3 balanced meals per day Consider reading food labels for Total Carbohydrate  Continue your activity level daily as tolerated Consider checking blood sugar 2 times per day Fasting and 2 hrs after a meal (of your choice) for 1 week. If your numbers are within range, cut back to several times per week alternating fasting and 2 hrs. Continue taking medication as directed by MD

## 2017-06-20 NOTE — Progress Notes (Signed)
Diabetes Self-Management Education  Visit Type: First/Initial  Appt. Start Time: 1400 Appt. End Time: 3790  06/20/2017  Deanna Shea, identified by name and date of birth, is a 72 y.o. female with a diagnosis of Diabetes: Type 2.   ASSESSMENT Patient states she has had pre-diabetes for a while, and after learning her A1c had increased at her last doctors appointment, she reports making changes such as eating breakfast regularly, choosing only poultry for her meat source, and went from sedentary to exercising everyday. Patient states she feels good with this much activity and has plenty of energy. Patient states she is retired and gets plenty of rest. Patient weight has decreased 2 lbs in 3 weeks.  Patient states she drinks a lot of water to avoid getting kidney stones again.  Height 5\' 7"  (1.702 m), weight 168 lb (76.2 kg). Body mass index is 26.31 kg/m.   RD provided OneTouch Verio Flex glucometer Lot #: [id number] X Exp: 07/06/18  Diabetes Self-Management Education - 06/20/17 1415      Visit Information   Visit Type  First/Initial      Initial Visit   Diabetes Type  Type 2    Are you currently following a meal plan?  No    Date Diagnosed  2018      Health Coping   How would you rate your overall health?  Good      Psychosocial Assessment   Patient Belief/Attitude about Diabetes  Motivated to manage diabetes    What is the last grade level you completed in school?  2 yrs college      Complications   Last HgB A1C per patient/outside source  7.9 % per 7.9%    How often do you check your blood sugar?  0 times/day (not testing)    Have you had a dilated eye exam in the past 12 months?  Yes    Have you had a dental exam in the past 12 months?  No    Are you checking your feet?  No      Dietary Intake   Breakfast  oatmeal, plain or apple cinnamon OR granola bar    Snack (morning)  none    Lunch  chicken salad or egg salad sandwich on wheat bread, diet dr pepper    Snack  (afternoon)  protein snack pack OR almonds    Dinner  poultry, cottage cheese or potatoes, veggies OR hard boiled egg, poultry    Snack (evening)  cheesit crackers    Beverage(s)  water, diet pepper      Exercise   Exercise Type  Light (walking / raking leaves)    How many days per week to you exercise?  7    How many minutes per day do you exercise?  20    Total minutes per week of exercise  140      Patient Education   Previous Diabetes Education  Yes (please comment) class at [facility] 2017     Disease state   Definition of diabetes, type 1 and 2, and the diagnosis of diabetes    Nutrition management   Role of diet in the treatment of diabetes and the relationship between the three main macronutrients and blood glucose level;Carbohydrate counting;Food label reading, portion sizes and measuring food.    Physical activity and exercise   Role of exercise on diabetes management, blood pressure control and cardiac health.    Medications  Reviewed patients medication for diabetes, action, purpose, timing of  dose and side effects.    Monitoring  Taught/evaluated SMBG meter.;Identified appropriate SMBG and/or A1C goals.      Individualized Goals (developed by patient)   Nutrition  General guidelines for healthy choices and portions discussed    Monitoring   test my blood glucose as discussed      Outcomes   Expected Outcomes  Demonstrated interest in learning. Expect positive outcomes    Future DMSE  PRN    Program Status  Completed     Individualized Plan for Diabetes Self-Management Training:   Learning Objective:  Patient will have a greater understanding of diabetes self-management. Patient education plan is to attend individual and/or group sessions per assessed needs and concerns.   Patient Instructions  Plan:  Aim for 2-3 Carb Choices per meal (30-45 grams)   Aim for 0-1 Carb Choices per snack if hungry (0-15 grams) Include protein with your meals and snacks Aim for 3 balanced  meals per day Consider reading food labels for Total Carbohydrate  Continue your activity level daily as tolerated Consider checking blood sugar 2 times per day Fasting and 2 hrs after a meal (of your choice) for 1 week. If your numbers are within range, cut back to several times per week alternating fasting and 2 hrs. Continue taking medication as directed by MD  Expected Outcomes:  Demonstrated interest in learning. Expect positive outcomes  Education material provided: Living Well with Diabetes, A1C conversion sheet, My Plate, Snack sheet and Carbohydrate counting sheet, Thyroid Health  If problems or questions, patient to contact team via:  Phone  Future DSME appointment: PRN

## 2017-06-27 ENCOUNTER — Other Ambulatory Visit: Payer: Self-pay | Admitting: Family Medicine

## 2017-06-27 DIAGNOSIS — Z1231 Encounter for screening mammogram for malignant neoplasm of breast: Secondary | ICD-10-CM

## 2017-07-04 ENCOUNTER — Ambulatory Visit
Admission: RE | Admit: 2017-07-04 | Discharge: 2017-07-04 | Disposition: A | Discharge status: Home / Self Care | Payer: Medicare HMO | Source: Ambulatory Visit | Attending: Family Medicine | Admitting: Family Medicine

## 2017-07-04 DIAGNOSIS — E02 Subclinical iodine-deficiency hypothyroidism: Secondary | ICD-10-CM | POA: Diagnosis not present

## 2017-07-04 DIAGNOSIS — E119 Type 2 diabetes mellitus without complications: Secondary | ICD-10-CM | POA: Diagnosis not present

## 2017-07-04 DIAGNOSIS — Z1231 Encounter for screening mammogram for malignant neoplasm of breast: Secondary | ICD-10-CM | POA: Diagnosis not present

## 2017-07-11 DIAGNOSIS — E02 Subclinical iodine-deficiency hypothyroidism: Secondary | ICD-10-CM | POA: Diagnosis not present

## 2017-07-11 DIAGNOSIS — E119 Type 2 diabetes mellitus without complications: Secondary | ICD-10-CM | POA: Diagnosis not present

## 2017-08-14 DIAGNOSIS — E119 Type 2 diabetes mellitus without complications: Secondary | ICD-10-CM | POA: Diagnosis not present

## 2017-08-14 DIAGNOSIS — Z79899 Other long term (current) drug therapy: Secondary | ICD-10-CM | POA: Diagnosis not present

## 2017-08-14 DIAGNOSIS — E7800 Pure hypercholesterolemia, unspecified: Secondary | ICD-10-CM | POA: Diagnosis not present

## 2017-08-14 DIAGNOSIS — Z7984 Long term (current) use of oral hypoglycemic drugs: Secondary | ICD-10-CM | POA: Diagnosis not present

## 2017-09-11 DIAGNOSIS — F3342 Major depressive disorder, recurrent, in full remission: Secondary | ICD-10-CM | POA: Diagnosis not present

## 2017-09-11 DIAGNOSIS — F411 Generalized anxiety disorder: Secondary | ICD-10-CM | POA: Diagnosis not present

## 2017-11-20 DIAGNOSIS — D229 Melanocytic nevi, unspecified: Secondary | ICD-10-CM | POA: Diagnosis not present

## 2017-11-20 DIAGNOSIS — L812 Freckles: Secondary | ICD-10-CM | POA: Diagnosis not present

## 2017-11-20 DIAGNOSIS — L821 Other seborrheic keratosis: Secondary | ICD-10-CM | POA: Diagnosis not present

## 2017-11-20 DIAGNOSIS — Z85828 Personal history of other malignant neoplasm of skin: Secondary | ICD-10-CM | POA: Diagnosis not present

## 2017-11-20 DIAGNOSIS — D1801 Hemangioma of skin and subcutaneous tissue: Secondary | ICD-10-CM | POA: Diagnosis not present

## 2017-11-20 DIAGNOSIS — L819 Disorder of pigmentation, unspecified: Secondary | ICD-10-CM | POA: Diagnosis not present

## 2017-11-20 DIAGNOSIS — L814 Other melanin hyperpigmentation: Secondary | ICD-10-CM | POA: Diagnosis not present

## 2017-12-14 DIAGNOSIS — E039 Hypothyroidism, unspecified: Secondary | ICD-10-CM | POA: Diagnosis not present

## 2017-12-14 DIAGNOSIS — E7800 Pure hypercholesterolemia, unspecified: Secondary | ICD-10-CM | POA: Diagnosis not present

## 2017-12-14 DIAGNOSIS — E119 Type 2 diabetes mellitus without complications: Secondary | ICD-10-CM | POA: Diagnosis not present

## 2018-01-09 DIAGNOSIS — E02 Subclinical iodine-deficiency hypothyroidism: Secondary | ICD-10-CM | POA: Diagnosis not present

## 2018-01-09 DIAGNOSIS — E119 Type 2 diabetes mellitus without complications: Secondary | ICD-10-CM | POA: Diagnosis not present

## 2018-01-16 DIAGNOSIS — E02 Subclinical iodine-deficiency hypothyroidism: Secondary | ICD-10-CM | POA: Diagnosis not present

## 2018-01-16 DIAGNOSIS — E119 Type 2 diabetes mellitus without complications: Secondary | ICD-10-CM | POA: Diagnosis not present

## 2018-01-16 DIAGNOSIS — E785 Hyperlipidemia, unspecified: Secondary | ICD-10-CM | POA: Diagnosis not present

## 2018-01-16 DIAGNOSIS — Z23 Encounter for immunization: Secondary | ICD-10-CM | POA: Diagnosis not present

## 2018-03-12 DIAGNOSIS — F3342 Major depressive disorder, recurrent, in full remission: Secondary | ICD-10-CM | POA: Diagnosis not present

## 2018-03-12 DIAGNOSIS — F411 Generalized anxiety disorder: Secondary | ICD-10-CM | POA: Diagnosis not present

## 2018-05-21 DIAGNOSIS — I4891 Unspecified atrial fibrillation: Secondary | ICD-10-CM | POA: Diagnosis not present

## 2018-05-21 DIAGNOSIS — Z0001 Encounter for general adult medical examination with abnormal findings: Secondary | ICD-10-CM | POA: Diagnosis not present

## 2018-05-21 DIAGNOSIS — Z23 Encounter for immunization: Secondary | ICD-10-CM | POA: Diagnosis not present

## 2018-05-21 DIAGNOSIS — E7800 Pure hypercholesterolemia, unspecified: Secondary | ICD-10-CM | POA: Diagnosis not present

## 2018-05-21 DIAGNOSIS — M8580 Other specified disorders of bone density and structure, unspecified site: Secondary | ICD-10-CM | POA: Diagnosis not present

## 2018-05-21 DIAGNOSIS — E039 Hypothyroidism, unspecified: Secondary | ICD-10-CM | POA: Diagnosis not present

## 2018-05-21 DIAGNOSIS — Z79899 Other long term (current) drug therapy: Secondary | ICD-10-CM | POA: Diagnosis not present

## 2018-05-21 DIAGNOSIS — E119 Type 2 diabetes mellitus without complications: Secondary | ICD-10-CM | POA: Diagnosis not present

## 2018-05-21 DIAGNOSIS — Z7984 Long term (current) use of oral hypoglycemic drugs: Secondary | ICD-10-CM | POA: Diagnosis not present

## 2018-06-02 ENCOUNTER — Other Ambulatory Visit: Payer: Self-pay | Admitting: Interventional Cardiology

## 2018-06-26 ENCOUNTER — Other Ambulatory Visit: Payer: Self-pay | Admitting: Interventional Cardiology

## 2018-07-15 ENCOUNTER — Other Ambulatory Visit: Payer: Self-pay | Admitting: Interventional Cardiology

## 2018-08-09 ENCOUNTER — Ambulatory Visit: Payer: Medicare HMO | Admitting: Interventional Cardiology

## 2018-09-11 DIAGNOSIS — F3342 Major depressive disorder, recurrent, in full remission: Secondary | ICD-10-CM | POA: Diagnosis not present

## 2018-09-11 DIAGNOSIS — F411 Generalized anxiety disorder: Secondary | ICD-10-CM | POA: Diagnosis not present

## 2018-10-09 ENCOUNTER — Other Ambulatory Visit: Payer: Self-pay | Admitting: Interventional Cardiology

## 2018-10-10 DIAGNOSIS — F3342 Major depressive disorder, recurrent, in full remission: Secondary | ICD-10-CM | POA: Diagnosis not present

## 2018-10-10 DIAGNOSIS — F411 Generalized anxiety disorder: Secondary | ICD-10-CM | POA: Diagnosis not present

## 2018-11-21 DIAGNOSIS — Z7984 Long term (current) use of oral hypoglycemic drugs: Secondary | ICD-10-CM | POA: Diagnosis not present

## 2018-11-21 DIAGNOSIS — E039 Hypothyroidism, unspecified: Secondary | ICD-10-CM | POA: Diagnosis not present

## 2018-11-21 DIAGNOSIS — E7800 Pure hypercholesterolemia, unspecified: Secondary | ICD-10-CM | POA: Diagnosis not present

## 2018-11-21 DIAGNOSIS — E119 Type 2 diabetes mellitus without complications: Secondary | ICD-10-CM | POA: Diagnosis not present

## 2018-11-21 DIAGNOSIS — I4891 Unspecified atrial fibrillation: Secondary | ICD-10-CM | POA: Diagnosis not present

## 2018-12-03 DIAGNOSIS — Z85828 Personal history of other malignant neoplasm of skin: Secondary | ICD-10-CM | POA: Diagnosis not present

## 2018-12-03 DIAGNOSIS — L814 Other melanin hyperpigmentation: Secondary | ICD-10-CM | POA: Diagnosis not present

## 2018-12-03 DIAGNOSIS — L819 Disorder of pigmentation, unspecified: Secondary | ICD-10-CM | POA: Diagnosis not present

## 2018-12-03 DIAGNOSIS — L821 Other seborrheic keratosis: Secondary | ICD-10-CM | POA: Diagnosis not present

## 2018-12-03 DIAGNOSIS — D485 Neoplasm of uncertain behavior of skin: Secondary | ICD-10-CM | POA: Diagnosis not present

## 2018-12-03 DIAGNOSIS — D229 Melanocytic nevi, unspecified: Secondary | ICD-10-CM | POA: Diagnosis not present

## 2018-12-13 NOTE — Progress Notes (Signed)
Cardiology Office Note   Date:  12/16/2018   ID:  Deanna Shea, DOB 09/15/1945, MRN [id number]  PCP:  Deanna Amel, MD    No chief complaint on file.  Palpitations.   Wt Readings from Last 3 Encounters:  12/16/18 163 lb (73.9 kg)  06/20/17 168 lb (76.2 kg)  05/29/17 171 lb (77.6 kg)       History of Present Illness: Deanna Shea is a 73 y.o. female  who had palpitations since she was 30 years. She went on inderal and lanoxin and has been on that combination for many years. When they tried to take her of or change the combination, she had her sx back. The diagnosis intially was mitral valve prolapse with atrial fibrillation.  I have never seen a strip of her atrial fibrillation.  Palpitations felt like a skipped beat lasting a second. She was in [place] at the time. Most recent echocardiogram was in 5/14 showing anterior leaflet prolapse, with mild MR.   Unfortunately, her husband passed away from metastatic parotid gland cancer in October 2014.  She had a ruptured appendix in 9/15 and tolerated the hospitalization well. No cardiac issues at that time. Dig level was 0.5 at the time.  She was diagnosed with diabetes in May 2017.  She started medication for the DM in Jan 2019.    Since the last visit, she tried lowering the Inderal dose to three times a day, but had more palpitations.  She went back to 4x/day and felt well.  Denies : Chest pain. Dizziness. Leg edema. Nitroglycerin use. Orthopnea. Palpitations. Paroxysmal nocturnal dyspnea. Shortness of breath. Syncope.   She walks for exercise daily.  She goes for 30 minutes.     Past Medical History:  Diagnosis Date  . Allergic rhinitis   . Anxiety   . Atrial fibrillation ([place]) 10/28/2013   diagnoses in the 20's, well controlled with Digoxin and Inderal   . Depression   . Diabetes mellitus without complication ([place])   . Gall stones   . Heart murmur   . Kidney disease, medullary sponge   . Kidney  stones   . Mitral valve disorders(424.0) 10/28/2013  . Mitral valve prolapse    congenital   . MRSA (methicillin resistant Staphylococcus aureus)    9/10  . Osteopenia    dexa 4/09, improved but not resolved 2011 DEXA  . Palpitations 10/28/2013  . Schizophrenia ([place])   . Shingles    2000    Past Surgical History:  Procedure Laterality Date  . APPENDECTOMY  01/29/2014  . CYSTOSCOPY W/ STONE MANIPULATION  1990's  . ELBOW ARTHROSCOPY Left 2007  . ELBOW ARTHROSCOPY Right 2008   "cyst removed; tricep"  . ELBOW ARTHROSCOPY WITH TENDON RECONSTRUCTION Right 2005; 2006  . EXPLORATORY LAPAROTOMY  01/29/2014  . KNEE ARTHROSCOPY Right 2004   "bone chip removed"  . KNEE ARTHROSCOPY Right 02/2009   "cleanup loose cartilage"  . LAPAROTOMY N/A 01/29/2014   Procedure: EXPLORATORY LAPAROTOMY, SMALL BOWEL RESECTION, APPENDECTOMY;  Surgeon: Donnie Mesa, MD;  Location: [facility];  Service: General;  Laterality: N/A;  . NASAL SINUS SURGERY  X 2   "for polyps"  . SHOULDER ARTHROSCOPY Right 1990's X 2  . SHOULDER ARTHROSCOPY W/ ROTATOR CUFF REPAIR Right   . SMALL INTESTINE SURGERY  01/29/2014     Current Outpatient Medications  Medication Sig Dispense Refill  . aspirin EC 81 MG tablet Take 81 mg by mouth daily.    . digoxin (LANOXIN) 0.25 MG  tablet Take 1 tablet (0.25 mg total) by mouth every other day. 45 tablet 1  . escitalopram (LEXAPRO) 20 MG tablet Take 20 mg by mouth daily.     Marland Kitchen levothyroxine (SYNTHROID, LEVOTHROID) 50 MCG tablet Take 50 mcg by mouth daily.  11  . metFORMIN (GLUCOPHAGE) 500 MG tablet Take 1 tablet by mouth 2 (two) times daily.  3  . OVER THE COUNTER MEDICATION Take 1 tablet by mouth at bedtime. Estrogen supplement    . propranolol (INDERAL) 20 MG tablet TAKE 1 TABLET (20 MG TOTAL) BY MOUTH 4 (FOUR) TIMES DAILY. 360 tablet 0  . risperiDONE (RISPERDAL) 2 MG tablet Take 2 mg by mouth at bedtime.     No current facility-administered medications for this visit.     Allergies:    Sulfa antibiotics    Social History:  The patient  reports that she has never smoked. She has never used smokeless tobacco. She reports that she does not drink alcohol or use drugs.   Family History:  [name] family history includes Arthritis in her father; Cataracts in her father; Dementia in her father; Diabetes Mellitus II in her mother; Hypertension in her mother.    ROS:  Please see the history of present illness.   Otherwise, review of systems are positive for .   All other systems are reviewed and negative.    PHYSICAL EXAM: VS:  BP 128/62   Pulse (!) 59   Ht 5\' 7"  (1.702 m)   Wt 163 lb (73.9 kg)   SpO2 96%   BMI 25.53 kg/m  , BMI Body mass index is 25.53 kg/m. GEN: Well nourished, well developed, in no acute distress  HEENT: normal  Neck: no JVD, carotid bruits, or masses Cardiac: RRR; no murmurs, rubs, or gallops,no edema  Respiratory:  clear to auscultation bilaterally, normal work of breathing GI: soft, nontender, nondistended, + BS MS: no deformity or atrophy  Skin: warm and dry, no rash Neuro:  Strength and sensation are intact Psych: euthymic mood, full affect   EKG:   The ekg ordered today demonstrates NSR, no ST changes   Recent Labs: No results found for requested labs within last 8760 hours.   Lipid Panel No results found for: CHOL, TRIG, HDL, CHOLHDL, VLDL, LDLCALC, LDLDIRECT   Other studies Reviewed: Additional studies/ records that were reviewed today with results demonstrating: 2014 echo reviewed, labs reviewed.   ASSESSMENT AND PLAN:  1. MVP: Last echocardiogram in 2014.  No CHF sx.   2. AFib: She is use digoxin for years.  She is also used aspirin.  She is wanting to avoid stronger anticoagulation.  I have not seen a strip of atrial fibrillation.  She has had this diagnosis for many years. 3. DM: LDL target less than 100.  This has been managed with diet and medications by her primary care doctor.  She has been trying to manage weight  gain as well.  LDL was 52 in Jan 2020. COntineu Crestor.    Current medicines are reviewed at length with the patient today.  The patient concerns regarding her medicines were addressed.  The following changes have been made:  No change  Labs/ tests ordered today include:  No orders of the defined types were placed in this encounter.   Recommend 150 minutes/week of aerobic exercise Low fat, low carb, high fiber diet recommended  Disposition:   FU in 1 year; will need repeat echo at some point when convenient   Signed, Conception Oms  Irish Lack, MD  12/16/2018 1:32 PM    [facility] [place], [place], [place]  [address] Phone: [phone]; Fax: [phone]

## 2018-12-16 ENCOUNTER — Encounter: Payer: Self-pay | Admitting: Interventional Cardiology

## 2018-12-16 ENCOUNTER — Ambulatory Visit (INDEPENDENT_AMBULATORY_CARE_PROVIDER_SITE_OTHER): Payer: Medicare HMO | Admitting: Interventional Cardiology

## 2018-12-16 ENCOUNTER — Other Ambulatory Visit: Payer: Self-pay

## 2018-12-16 VITALS — BP 128/62 | HR 59 | Ht 67.0 in | Wt 163.0 lb

## 2018-12-16 DIAGNOSIS — I341 Nonrheumatic mitral (valve) prolapse: Secondary | ICD-10-CM | POA: Diagnosis not present

## 2018-12-16 DIAGNOSIS — I059 Rheumatic mitral valve disease, unspecified: Secondary | ICD-10-CM | POA: Diagnosis not present

## 2018-12-16 DIAGNOSIS — I480 Paroxysmal atrial fibrillation: Secondary | ICD-10-CM

## 2018-12-16 DIAGNOSIS — E119 Type 2 diabetes mellitus without complications: Secondary | ICD-10-CM

## 2018-12-16 MED ORDER — DIGOXIN 250 MCG PO TABS: 0.2500 mg | ORAL_TABLET | ORAL | 3 refills | Status: DC

## 2018-12-16 NOTE — Patient Instructions (Addendum)
Medication Instructions:  Your physician recommends that you continue on your current medications as directed. Please refer to the Current Medication list given to you today.  If you need a refill on your cardiac medications before your next appointment, please call your pharmacy.   Lab work: TODAY: BMET, Digoxin  If you have labs (blood work) drawn today and your tests are completely normal, you will receive your results only by: Marland Kitchen MyChart Message (if you have MyChart) OR . A paper copy in the mail If you have any lab test that is abnormal or we need to change your treatment, we will call you to review the results.  Testing/Procedures: None ordered  Follow-Up: At [facility], you and your health needs are our priority.  As part of our continuing mission to provide you with exceptional heart care, we have created designated Provider Care Teams.  These Care Teams include your primary Cardiologist (physician) and Advanced Practice Providers (APPs -  Physician Assistants and Nurse Practitioners) who all work together to provide you with the care you need, when you need it. . You will need a follow up appointment in 1 year.  Please call our office 2 months in advance to schedule this appointment.  You may see Casandra Doffing, MD or one of the following Advanced Practice Providers on your designated Care Team:   . Lyda Jester, PA-C . Dayna Dunn, PA-C . Ermalinda Barrios, PA-C  Any Other Special Instructions Will Be Listed Below (If Applicable).

## 2018-12-17 DIAGNOSIS — F3342 Major depressive disorder, recurrent, in full remission: Secondary | ICD-10-CM | POA: Diagnosis not present

## 2018-12-17 DIAGNOSIS — F3341 Major depressive disorder, recurrent, in partial remission: Secondary | ICD-10-CM | POA: Diagnosis not present

## 2018-12-17 DIAGNOSIS — F411 Generalized anxiety disorder: Secondary | ICD-10-CM | POA: Diagnosis not present

## 2018-12-17 LAB — BASIC METABOLIC PANEL
BUN/Creatinine Ratio: 21 (ref 12–28)
BUN: 15 mg/dL (ref 8–27)
CO2: 26 mmol/L (ref 20–29)
Calcium: 9.5 mg/dL (ref 8.7–10.3)
Chloride: 98 mmol/L (ref 96–106)
Creatinine, Ser: 0.73 mg/dL (ref 0.57–1.00)
GFR calc Af Amer: 96 mL/min/{1.73_m2} (ref >59)
GFR calc non Af Amer: 83 mL/min/{1.73_m2} (ref >59)
Glucose: 158 mg/dL — ABNORMAL HIGH (ref 65–99)
Potassium: 4.3 mmol/L (ref 3.5–5.2)
Sodium: 140 mmol/L (ref 134–144)

## 2018-12-17 LAB — DIGOXIN LEVEL: Digoxin, Serum: 0.6 ng/mL (ref 0.5–0.9)

## 2019-01-06 ENCOUNTER — Other Ambulatory Visit: Payer: Self-pay | Admitting: Interventional Cardiology

## 2019-01-10 DIAGNOSIS — E785 Hyperlipidemia, unspecified: Secondary | ICD-10-CM | POA: Diagnosis not present

## 2019-01-10 DIAGNOSIS — E119 Type 2 diabetes mellitus without complications: Secondary | ICD-10-CM | POA: Diagnosis not present

## 2019-01-10 DIAGNOSIS — E02 Subclinical iodine-deficiency hypothyroidism: Secondary | ICD-10-CM | POA: Diagnosis not present

## 2019-01-23 DIAGNOSIS — E119 Type 2 diabetes mellitus without complications: Secondary | ICD-10-CM | POA: Diagnosis not present

## 2019-01-23 DIAGNOSIS — E02 Subclinical iodine-deficiency hypothyroidism: Secondary | ICD-10-CM | POA: Diagnosis not present

## 2019-01-23 DIAGNOSIS — E785 Hyperlipidemia, unspecified: Secondary | ICD-10-CM | POA: Diagnosis not present

## 2019-01-23 DIAGNOSIS — E7800 Pure hypercholesterolemia, unspecified: Secondary | ICD-10-CM | POA: Diagnosis not present

## 2019-03-20 DIAGNOSIS — F411 Generalized anxiety disorder: Secondary | ICD-10-CM | POA: Diagnosis not present

## 2019-03-20 DIAGNOSIS — F3342 Major depressive disorder, recurrent, in full remission: Secondary | ICD-10-CM | POA: Diagnosis not present

## 2019-03-27 DIAGNOSIS — E02 Subclinical iodine-deficiency hypothyroidism: Secondary | ICD-10-CM | POA: Diagnosis not present

## 2019-05-26 DIAGNOSIS — I4891 Unspecified atrial fibrillation: Secondary | ICD-10-CM | POA: Diagnosis not present

## 2019-05-26 DIAGNOSIS — Z7984 Long term (current) use of oral hypoglycemic drugs: Secondary | ICD-10-CM | POA: Diagnosis not present

## 2019-05-26 DIAGNOSIS — E7800 Pure hypercholesterolemia, unspecified: Secondary | ICD-10-CM | POA: Diagnosis not present

## 2019-05-26 DIAGNOSIS — E119 Type 2 diabetes mellitus without complications: Secondary | ICD-10-CM | POA: Diagnosis not present

## 2019-05-26 DIAGNOSIS — Z0001 Encounter for general adult medical examination with abnormal findings: Secondary | ICD-10-CM | POA: Diagnosis not present

## 2019-05-26 DIAGNOSIS — E039 Hypothyroidism, unspecified: Secondary | ICD-10-CM | POA: Diagnosis not present

## 2019-07-07 ENCOUNTER — Ambulatory Visit: Payer: Medicare HMO | Attending: Internal Medicine

## 2019-07-07 DIAGNOSIS — Z23 Encounter for immunization: Secondary | ICD-10-CM

## 2019-07-07 NOTE — Progress Notes (Signed)
   Covid-19 Vaccination Clinic  Name:  Deanna Shea    MRN: [id number] DOB: May 01, 1946  07/07/2019  [name] was observed post Covid-19 immunization for 15 minutes without incidence. She was provided with Vaccine Information Sheet and instruction to access the V-Safe system.   [name] was instructed to call 911 with any severe reactions post vaccine: Marland Kitchen Difficulty breathing  . Swelling of your face and throat  . A fast heartbeat  . A bad rash all over your body  . Dizziness and weakness    Immunizations Administered    Name Date Dose VIS Date Route   Pfizer COVID-19 Vaccine 07/07/2019 11:32 AM 0.3 mL 04/18/2019 Intramuscular   Manufacturer: [place]   Lot: [id number]   [place]: [id number]

## 2019-07-08 DIAGNOSIS — E7800 Pure hypercholesterolemia, unspecified: Secondary | ICD-10-CM | POA: Diagnosis not present

## 2019-07-08 DIAGNOSIS — Z7984 Long term (current) use of oral hypoglycemic drugs: Secondary | ICD-10-CM | POA: Diagnosis not present

## 2019-07-08 DIAGNOSIS — E039 Hypothyroidism, unspecified: Secondary | ICD-10-CM | POA: Diagnosis not present

## 2019-07-08 DIAGNOSIS — E119 Type 2 diabetes mellitus without complications: Secondary | ICD-10-CM | POA: Diagnosis not present

## 2019-07-17 DIAGNOSIS — F411 Generalized anxiety disorder: Secondary | ICD-10-CM | POA: Diagnosis not present

## 2019-07-17 DIAGNOSIS — F3342 Major depressive disorder, recurrent, in full remission: Secondary | ICD-10-CM | POA: Diagnosis not present

## 2019-08-05 ENCOUNTER — Ambulatory Visit: Payer: Medicare HMO | Attending: Internal Medicine

## 2019-08-05 DIAGNOSIS — Z23 Encounter for immunization: Secondary | ICD-10-CM

## 2019-08-05 NOTE — Progress Notes (Signed)
   Covid-19 Vaccination Clinic  Name:  Deanna Shea    MRN: [id number] DOB: 05/28/1945  08/05/2019  Deanna Shea was observed post Covid-19 immunization for 15 minutes without incident. She was provided with Vaccine Information Sheet and instruction to access the V-Safe system.   Deanna Shea was instructed to call 911 with any severe reactions post vaccine: Marland Kitchen Difficulty breathing  . Swelling of face and throat  . A fast heartbeat  . A bad rash all over body  . Dizziness and weakness   Immunizations Administered    Name Date Dose VIS Date Route   Pfizer COVID-19 Vaccine 08/05/2019 11:34 AM 0.3 mL 04/18/2019 Intramuscular   Manufacturer: [place]   Lot: [id number]   [place]: [id number]

## 2019-08-26 ENCOUNTER — Other Ambulatory Visit: Payer: Self-pay | Admitting: Family Medicine

## 2019-08-26 DIAGNOSIS — Z1231 Encounter for screening mammogram for malignant neoplasm of breast: Secondary | ICD-10-CM

## 2019-12-18 ENCOUNTER — Other Ambulatory Visit: Payer: Self-pay | Admitting: Interventional Cardiology

## 2020-01-13 ENCOUNTER — Other Ambulatory Visit: Payer: Self-pay | Admitting: Cardiovascular Disease

## 2020-01-13 ENCOUNTER — Other Ambulatory Visit: Payer: Self-pay | Admitting: Interventional Cardiology

## 2020-01-15 DIAGNOSIS — F3342 Major depressive disorder, recurrent, in full remission: Secondary | ICD-10-CM | POA: Diagnosis not present

## 2020-01-15 DIAGNOSIS — F411 Generalized anxiety disorder: Secondary | ICD-10-CM | POA: Diagnosis not present

## 2020-01-15 MED ORDER — DIGOXIN 250 MCG PO TABS: 250.0000 ug | ORAL_TABLET | ORAL | 0 refills | Status: DC

## 2020-02-06 ENCOUNTER — Other Ambulatory Visit: Payer: Self-pay | Admitting: Interventional Cardiology

## 2020-02-08 ENCOUNTER — Other Ambulatory Visit: Payer: Self-pay | Admitting: Interventional Cardiology

## 2020-02-16 ENCOUNTER — Other Ambulatory Visit: Payer: Self-pay | Admitting: Interventional Cardiology

## 2020-02-16 NOTE — Progress Notes (Signed)
Cardiology Office Note   Date:  02/17/2020   ID:  Deanna Shea, DOB 1945-06-07, MRN [id number]  PCP:  Deanna Amel, MD    No chief complaint on file.  Palpitations.  Wt Readings from Last 3 Encounters:  02/17/20 160 lb 6.4 oz (72.8 kg)  12/16/18 163 lb (73.9 kg)  06/20/17 168 lb (76.2 kg)       History of Present Illness: Deanna Shea is a 74 y.o. female   who had palpitations since she was 30 years. She went on inderal and lanoxin and has been on that combination for many years. When they tried to take her of or change the combination, she had her sx back. The diagnosis intially was mitral valve prolapse with atrial fibrillation.  I have never seen a strip of her atrial fibrillation.  Coronary CTA showed 0 calcium score in 2013.  Palpitations felt like a skipped beat lasting a second. She was in [place] at the time. Most recent echocardiogram was in 5/14 showing anterior leaflet prolapse, with mild MR.   Unfortunately, her husband passed away from metastatic parotid gland cancer in October 2014.  She had a ruptured appendix in 9/15 and tolerated the hospitalization well. No cardiac issues at that time. Dig level was 0.5 at the time.  She was diagnosed with diabetes in May 2017.  She started medication for the DM in Jan 2019.  Since the last visit, she tried lowering the Inderal dose to three times a day, but had more palpitations.  She went back to 4x/day and felt well.  Since the last visit, she has done well with digoxin and propranolol.  Denies : Chest pain. Dizziness. Leg edema. Nitroglycerin use. Orthopnea. Palpitations. Paroxysmal nocturnal dyspnea. Shortness of breath. Syncope.   She walks regularly and has no problems.       Past Medical History:  Diagnosis Date  . Allergic rhinitis   . Anxiety   . Atrial fibrillation ([place]) 10/28/2013   diagnoses in the 20's, well controlled with Digoxin and Inderal   . Depression   . Diabetes  mellitus without complication ([place])   . Gall stones   . Heart murmur   . Kidney disease, medullary sponge   . Kidney stones   . Mitral valve disorders(424.0) 10/28/2013  . Mitral valve prolapse    congenital   . MRSA (methicillin resistant Staphylococcus aureus)    9/10  . Osteopenia    dexa 4/09, improved but not resolved 2011 DEXA  . Palpitations 10/28/2013  . Schizophrenia ([place])   . Shingles    2000    Past Surgical History:  Procedure Laterality Date  . APPENDECTOMY  01/29/2014  . CYSTOSCOPY W/ STONE MANIPULATION  1990's  . ELBOW ARTHROSCOPY Left 2007  . ELBOW ARTHROSCOPY Right 2008   "cyst removed; tricep"  . ELBOW ARTHROSCOPY WITH TENDON RECONSTRUCTION Right 2005; 2006  . EXPLORATORY LAPAROTOMY  01/29/2014  . KNEE ARTHROSCOPY Right 2004   "bone chip removed"  . KNEE ARTHROSCOPY Right 02/2009   "cleanup loose cartilage"  . LAPAROTOMY N/A 01/29/2014   Procedure: EXPLORATORY LAPAROTOMY, SMALL BOWEL RESECTION, APPENDECTOMY;  Surgeon: Donnie Mesa, MD;  Location: [facility];  Service: General;  Laterality: N/A;  . NASAL SINUS SURGERY  X 2   "for polyps"  . SHOULDER ARTHROSCOPY Right 1990's X 2  . SHOULDER ARTHROSCOPY W/ ROTATOR CUFF REPAIR Right   . SMALL INTESTINE SURGERY  01/29/2014     Current Outpatient Medications  Medication Sig Dispense Refill  .  aspirin EC 81 MG tablet Take 81 mg by mouth daily.    . digoxin (LANOXIN) 0.25 MG tablet Take 1 tablet (0.25 mg total) by mouth every other day. Please make overdue appt with Dr. Irish Lack before anymore refills. 2nd attempt 8 tablet 1  . escitalopram (LEXAPRO) 20 MG tablet Take 20 mg by mouth daily.     . metFORMIN (GLUCOPHAGE) 500 MG tablet Take 1 tablet by mouth 2 (two) times daily.  3  . OVER THE COUNTER MEDICATION Take 1 tablet by mouth at bedtime. Estrogen supplement    . propranolol (INDERAL) 20 MG tablet Take 1 tablet (20 mg total) by mouth 4 (four) times daily. Patient needs appointment for any future refills.  Please  call [phone] to schedule appointment. 1st attempt. 112 tablet 0  . risperiDONE (RISPERDAL) 2 MG tablet Take 2 mg by mouth at bedtime.    Marland Kitchen levothyroxine (SYNTHROID) 75 MCG tablet     . rosuvastatin (CRESTOR) 5 MG tablet Take 5 mg by mouth daily.      No current facility-administered medications for this visit.    Allergies:   Sulfa antibiotics    Social History:  The patient  reports that she has never smoked. She has never used smokeless tobacco. She reports that she does not drink alcohol and does not use drugs.   Family History:  [name] family history includes Arthritis in her father; Cataracts in her father; Dementia in her father; Diabetes Mellitus II in her mother; Hypertension in her mother.    ROS:  Please see the history of present illness.   Otherwise, review of systems are positive for rare leg edema after a long trip- none recent.   All other systems are reviewed and negative.    PHYSICAL EXAM: VS:  BP 124/60   Pulse (!) 56   Ht 5\' 7"  (1.702 m)   Wt 160 lb 6.4 oz (72.8 kg)   SpO2 94%   BMI 25.12 kg/m  , BMI Body mass index is 25.12 kg/m. GEN: Well nourished, well developed, in no acute distress  HEENT: normal  Neck: no JVD, carotid bruits, or masses Cardiac: RRR; no murmurs, rubs, or gallops,no edema  Respiratory:  clear to auscultation bilaterally, normal work of breathing GI: soft, nontender, nondistended, + BS MS: no deformity or atrophy  Skin: warm and dry, no rash Neuro:  Strength and sensation are intact Psych: euthymic mood, full affect   EKG:   The ekg ordered today demonstrates NSR, no ST changes   Recent Labs: No results found for requested labs within last 8760 hours.   Lipid Panel No results found for: CHOL, TRIG, HDL, CHOLHDL, VLDL, LDLCALC, LDLDIRECT   Other studies Reviewed: Additional studies/ records that were reviewed today with results demonstrating: 2014 echo results reviewed. 2013 cardiac CT- calcium score  0.   ASSESSMENT AND PLAN:  1. Palpitations: Controlled on current meds.  2. MVP- anterior leaflet prolapse in 2014 with mild MR posteriorly fdirected on exam.  No CHF on exam.  Should have f/u echo in the future.  3. Hypothyroid: dose was increased.   4. Cr stable 6 months ago.  Low dose Dig.  WOuld have PMD  check dig level at next annual physical.  5. DM2: well controlled.  On statin. A1C 6.3   Current medicines are reviewed at length with the patient today.  The patient concerns regarding her medicines were addressed.  The following changes have been made:  No change  Labs/ tests  ordered today include:  No orders of the defined types were placed in this encounter.   Recommend 150 minutes/week of aerobic exercise Low fat, low carb, high fiber diet recommended  Disposition:   FU in 1 year   Signed, Larae Grooms, MD  02/17/2020 11:51 AM    [facility] [place], [place], [place]  [address] Phone: [phone]; Fax: [phone]

## 2020-02-17 ENCOUNTER — Ambulatory Visit: Payer: Medicare HMO | Admitting: Interventional Cardiology

## 2020-02-17 ENCOUNTER — Encounter: Payer: Self-pay | Admitting: Interventional Cardiology

## 2020-02-17 ENCOUNTER — Other Ambulatory Visit: Payer: Self-pay

## 2020-02-17 VITALS — BP 124/60 | HR 56 | Ht 67.0 in | Wt 160.4 lb

## 2020-02-17 DIAGNOSIS — I480 Paroxysmal atrial fibrillation: Secondary | ICD-10-CM | POA: Diagnosis not present

## 2020-02-17 DIAGNOSIS — E119 Type 2 diabetes mellitus without complications: Secondary | ICD-10-CM | POA: Diagnosis not present

## 2020-02-17 DIAGNOSIS — I059 Rheumatic mitral valve disease, unspecified: Secondary | ICD-10-CM | POA: Diagnosis not present

## 2020-02-17 DIAGNOSIS — F209 Schizophrenia, unspecified: Secondary | ICD-10-CM | POA: Diagnosis not present

## 2020-02-17 NOTE — Patient Instructions (Signed)

## 2020-02-24 ENCOUNTER — Other Ambulatory Visit: Payer: Self-pay | Admitting: Interventional Cardiology

## 2020-02-24 ENCOUNTER — Telehealth: Payer: Self-pay | Admitting: Interventional Cardiology

## 2020-02-24 MED ORDER — PROPRANOLOL HCL 20 MG PO TABS: 20.0000 mg | ORAL_TABLET | Freq: Four times a day (QID) | ORAL | 3 refills | Status: DC

## 2020-02-24 NOTE — Telephone Encounter (Signed)
*  STAT* If patient is at the pharmacy, call can be transferred to refill team.   1. Which medications need to be refilled? (please list name of each medication and dose if known) propranolol (INDERAL) 20 MG tablet  2. Which pharmacy/location (including street and city if local pharmacy) is medication to be sent to? [id number] - [place], [place] - [address]. AT [address]  3. Do they need a 30 day or 90 day supply? 90   Patient saw Dr. Irish Lack on 02/17/20

## 2020-02-24 NOTE — Telephone Encounter (Signed)
Pt's medication was sent to pt's pharmacy as requested. Confirmation received.  °

## 2020-03-11 DIAGNOSIS — E02 Subclinical iodine-deficiency hypothyroidism: Secondary | ICD-10-CM | POA: Diagnosis not present

## 2020-03-11 DIAGNOSIS — E785 Hyperlipidemia, unspecified: Secondary | ICD-10-CM | POA: Diagnosis not present

## 2020-03-11 DIAGNOSIS — E119 Type 2 diabetes mellitus without complications: Secondary | ICD-10-CM | POA: Diagnosis not present

## 2020-03-17 ENCOUNTER — Telehealth: Payer: Self-pay | Admitting: Interventional Cardiology

## 2020-03-17 MED ORDER — DIGOXIN 250 MCG PO TABS: 0.2500 mg | ORAL_TABLET | ORAL | 3 refills | Status: DC

## 2020-03-17 NOTE — Telephone Encounter (Signed)
Pt's medication was sent to pt's pharmacy as requested. Confirmation received.  °

## 2020-03-17 NOTE — Telephone Encounter (Signed)
*  STAT* If patient is at the pharmacy, call can be transferred to refill team.   1. Which medications need to be refilled? (please list name of each medication and dose if known) digoxin (LANOXIN) 0.25 MG tablet  2. Which pharmacy/location (including street and city if local pharmacy) is medication to be sent to? [id number] - [place], [place] - [address]. AT [address]  3. Do they need a 30 day or 90 day supply? 90 day supply  Patient was seen on 02/17/20. Please update note stating that patient needs an appointment for additional refills.

## 2020-03-18 DIAGNOSIS — E02 Subclinical iodine-deficiency hypothyroidism: Secondary | ICD-10-CM | POA: Diagnosis not present

## 2020-03-18 DIAGNOSIS — E7800 Pure hypercholesterolemia, unspecified: Secondary | ICD-10-CM | POA: Diagnosis not present

## 2020-03-18 DIAGNOSIS — E119 Type 2 diabetes mellitus without complications: Secondary | ICD-10-CM | POA: Diagnosis not present

## 2020-05-31 DIAGNOSIS — Z79899 Other long term (current) drug therapy: Secondary | ICD-10-CM | POA: Diagnosis not present

## 2020-05-31 DIAGNOSIS — E039 Hypothyroidism, unspecified: Secondary | ICD-10-CM | POA: Diagnosis not present

## 2020-05-31 DIAGNOSIS — I4891 Unspecified atrial fibrillation: Secondary | ICD-10-CM | POA: Diagnosis not present

## 2020-05-31 DIAGNOSIS — E2839 Other primary ovarian failure: Secondary | ICD-10-CM | POA: Diagnosis not present

## 2020-05-31 DIAGNOSIS — Z0001 Encounter for general adult medical examination with abnormal findings: Secondary | ICD-10-CM | POA: Diagnosis not present

## 2020-05-31 DIAGNOSIS — E119 Type 2 diabetes mellitus without complications: Secondary | ICD-10-CM | POA: Diagnosis not present

## 2020-05-31 DIAGNOSIS — E1169 Type 2 diabetes mellitus with other specified complication: Secondary | ICD-10-CM | POA: Diagnosis not present

## 2020-05-31 DIAGNOSIS — M810 Age-related osteoporosis without current pathological fracture: Secondary | ICD-10-CM | POA: Diagnosis not present

## 2020-05-31 DIAGNOSIS — E7800 Pure hypercholesterolemia, unspecified: Secondary | ICD-10-CM | POA: Diagnosis not present

## 2020-06-02 ENCOUNTER — Other Ambulatory Visit: Payer: Self-pay | Admitting: Family Medicine

## 2020-06-02 DIAGNOSIS — E2839 Other primary ovarian failure: Secondary | ICD-10-CM

## 2020-10-06 ENCOUNTER — Other Ambulatory Visit: Payer: Self-pay | Admitting: Family Medicine

## 2020-10-06 DIAGNOSIS — Z1231 Encounter for screening mammogram for malignant neoplasm of breast: Secondary | ICD-10-CM

## 2020-10-08 ENCOUNTER — Ambulatory Visit: Payer: Medicare HMO

## 2020-10-08 ENCOUNTER — Ambulatory Visit
Admission: RE | Admit: 2020-10-08 | Discharge: 2020-10-08 | Disposition: A | Discharge status: Home / Self Care | Payer: Medicare HMO | Source: Ambulatory Visit | Attending: Family Medicine | Admitting: Family Medicine

## 2020-10-08 ENCOUNTER — Other Ambulatory Visit: Payer: Self-pay

## 2020-10-08 DIAGNOSIS — Z1231 Encounter for screening mammogram for malignant neoplasm of breast: Secondary | ICD-10-CM | POA: Diagnosis not present

## 2020-10-11 ENCOUNTER — Ambulatory Visit
Admission: RE | Admit: 2020-10-11 | Discharge: 2020-10-11 | Disposition: A | Discharge status: Home / Self Care | Payer: Medicare HMO | Source: Ambulatory Visit | Attending: Family Medicine | Admitting: Family Medicine

## 2020-10-11 ENCOUNTER — Other Ambulatory Visit: Payer: Self-pay

## 2020-10-11 DIAGNOSIS — Z780 Asymptomatic menopausal state: Secondary | ICD-10-CM | POA: Diagnosis not present

## 2020-10-11 DIAGNOSIS — M85852 Other specified disorders of bone density and structure, left thigh: Secondary | ICD-10-CM | POA: Diagnosis not present

## 2020-10-11 DIAGNOSIS — E2839 Other primary ovarian failure: Secondary | ICD-10-CM

## 2021-01-07 DIAGNOSIS — K5730 Diverticulosis of large intestine without perforation or abscess without bleeding: Secondary | ICD-10-CM | POA: Diagnosis not present

## 2021-01-07 DIAGNOSIS — Z86010 Personal history of colonic polyps: Secondary | ICD-10-CM | POA: Diagnosis not present

## 2021-01-07 DIAGNOSIS — D175 Benign lipomatous neoplasm of intra-abdominal organs: Secondary | ICD-10-CM | POA: Diagnosis not present

## 2021-01-07 DIAGNOSIS — K640 First degree hemorrhoids: Secondary | ICD-10-CM | POA: Diagnosis not present

## 2021-01-07 DIAGNOSIS — K621 Rectal polyp: Secondary | ICD-10-CM | POA: Diagnosis not present

## 2021-01-07 DIAGNOSIS — D122 Benign neoplasm of ascending colon: Secondary | ICD-10-CM | POA: Diagnosis not present

## 2021-01-11 DIAGNOSIS — F3342 Major depressive disorder, recurrent, in full remission: Secondary | ICD-10-CM | POA: Diagnosis not present

## 2021-01-11 DIAGNOSIS — F411 Generalized anxiety disorder: Secondary | ICD-10-CM | POA: Diagnosis not present

## 2021-01-12 DIAGNOSIS — F3342 Major depressive disorder, recurrent, in full remission: Secondary | ICD-10-CM | POA: Diagnosis not present

## 2021-01-12 DIAGNOSIS — D122 Benign neoplasm of ascending colon: Secondary | ICD-10-CM | POA: Diagnosis not present

## 2021-01-12 DIAGNOSIS — K621 Rectal polyp: Secondary | ICD-10-CM | POA: Diagnosis not present

## 2021-01-12 DIAGNOSIS — F411 Generalized anxiety disorder: Secondary | ICD-10-CM | POA: Diagnosis not present

## 2021-02-21 ENCOUNTER — Other Ambulatory Visit: Payer: Self-pay | Admitting: Interventional Cardiology

## 2021-03-11 DIAGNOSIS — E119 Type 2 diabetes mellitus without complications: Secondary | ICD-10-CM | POA: Diagnosis not present

## 2021-03-11 DIAGNOSIS — E785 Hyperlipidemia, unspecified: Secondary | ICD-10-CM | POA: Diagnosis not present

## 2021-03-11 DIAGNOSIS — E7800 Pure hypercholesterolemia, unspecified: Secondary | ICD-10-CM | POA: Diagnosis not present

## 2021-03-11 DIAGNOSIS — E02 Subclinical iodine-deficiency hypothyroidism: Secondary | ICD-10-CM | POA: Diagnosis not present

## 2021-03-12 ENCOUNTER — Other Ambulatory Visit: Payer: Self-pay | Admitting: Interventional Cardiology

## 2021-03-18 DIAGNOSIS — E785 Hyperlipidemia, unspecified: Secondary | ICD-10-CM | POA: Diagnosis not present

## 2021-03-18 DIAGNOSIS — E119 Type 2 diabetes mellitus without complications: Secondary | ICD-10-CM | POA: Diagnosis not present

## 2021-03-18 DIAGNOSIS — E7800 Pure hypercholesterolemia, unspecified: Secondary | ICD-10-CM | POA: Diagnosis not present

## 2021-03-18 DIAGNOSIS — E02 Subclinical iodine-deficiency hypothyroidism: Secondary | ICD-10-CM | POA: Diagnosis not present

## 2021-03-19 ENCOUNTER — Other Ambulatory Visit: Payer: Self-pay | Admitting: Interventional Cardiology

## 2021-04-07 ENCOUNTER — Other Ambulatory Visit: Payer: Self-pay | Admitting: Interventional Cardiology

## 2021-04-11 ENCOUNTER — Other Ambulatory Visit: Payer: Self-pay | Admitting: Interventional Cardiology

## 2021-04-12 MED ORDER — DIGOXIN 250 MCG PO TABS: 250.0000 ug | ORAL_TABLET | ORAL | 0 refills | Status: DC

## 2021-04-15 ENCOUNTER — Other Ambulatory Visit: Payer: Self-pay | Admitting: Interventional Cardiology

## 2021-04-26 ENCOUNTER — Other Ambulatory Visit: Payer: Self-pay | Admitting: Interventional Cardiology

## 2021-04-26 MED ORDER — DIGOXIN 250 MCG PO TABS: 250.0000 ug | ORAL_TABLET | ORAL | 0 refills | Status: DC

## 2021-05-05 ENCOUNTER — Other Ambulatory Visit: Payer: Self-pay | Admitting: Interventional Cardiology

## 2021-06-21 DIAGNOSIS — E119 Type 2 diabetes mellitus without complications: Secondary | ICD-10-CM | POA: Diagnosis not present

## 2021-06-21 DIAGNOSIS — E02 Subclinical iodine-deficiency hypothyroidism: Secondary | ICD-10-CM | POA: Diagnosis not present

## 2021-06-28 DIAGNOSIS — E785 Hyperlipidemia, unspecified: Secondary | ICD-10-CM | POA: Diagnosis not present

## 2021-06-28 DIAGNOSIS — E02 Subclinical iodine-deficiency hypothyroidism: Secondary | ICD-10-CM | POA: Diagnosis not present

## 2021-06-28 DIAGNOSIS — E7800 Pure hypercholesterolemia, unspecified: Secondary | ICD-10-CM | POA: Diagnosis not present

## 2021-06-28 DIAGNOSIS — E119 Type 2 diabetes mellitus without complications: Secondary | ICD-10-CM | POA: Diagnosis not present

## 2021-07-06 DIAGNOSIS — E1169 Type 2 diabetes mellitus with other specified complication: Secondary | ICD-10-CM | POA: Diagnosis not present

## 2021-07-06 DIAGNOSIS — Z0001 Encounter for general adult medical examination with abnormal findings: Secondary | ICD-10-CM | POA: Diagnosis not present

## 2021-07-06 DIAGNOSIS — E7800 Pure hypercholesterolemia, unspecified: Secondary | ICD-10-CM | POA: Diagnosis not present

## 2021-07-06 DIAGNOSIS — E039 Hypothyroidism, unspecified: Secondary | ICD-10-CM | POA: Diagnosis not present

## 2021-07-06 DIAGNOSIS — I4891 Unspecified atrial fibrillation: Secondary | ICD-10-CM | POA: Diagnosis not present

## 2021-07-06 DIAGNOSIS — E119 Type 2 diabetes mellitus without complications: Secondary | ICD-10-CM | POA: Diagnosis not present

## 2021-07-06 DIAGNOSIS — Z79899 Other long term (current) drug therapy: Secondary | ICD-10-CM | POA: Diagnosis not present

## 2021-07-12 DIAGNOSIS — F411 Generalized anxiety disorder: Secondary | ICD-10-CM | POA: Diagnosis not present

## 2021-07-12 DIAGNOSIS — F3342 Major depressive disorder, recurrent, in full remission: Secondary | ICD-10-CM | POA: Diagnosis not present

## 2021-07-24 DIAGNOSIS — E039 Hypothyroidism, unspecified: Secondary | ICD-10-CM | POA: Insufficient documentation

## 2021-07-24 NOTE — Progress Notes (Signed)
?  ?  ?Cardiology Office Note ? ? ?Date:  07/26/2021  ? ?ID:  Deanna Shea, DOB 02/22/1946, MRN [id number] ? ?PCP:  Lujean Amel, MD  ? ? ?Chief Complaint  ?Patient presents with  ? Follow-up  ? ?MVP ? ?Wt Readings from Last 3 Encounters:  ?07/26/21 152 lb (68.9 kg)  ?02/17/20 160 lb 6.4 oz (72.8 kg)  ?12/16/18 163 lb (73.9 kg)  ?  ? ?  ?History of Present Illness: ?Deanna Shea is a 76 y.o. female   who had palpitations since she was 30 years. She went on inderal and lanoxin and has been on that combination for many years. When they tried to take her of or change the combination, she had her sx back. The diagnosis intially was mitral valve prolapse with atrial fibrillation.  I have never seen a strip of her atrial fibrillation. ?  ?Coronary CTA showed 0 calcium score in 2013. ? ?Palpitations felt like a skipped beat lasting a second. She was in [place] at the time. Echocardiogram was in 5/14 showing anterior leaflet prolapse, with mild MR.  ?  ?Unfortunately, her husband passed away from metastatic parotid gland cancer in October 2014. ?  ?She had a ruptured appendix in 9/15 and tolerated the hospitalization well.  No cardiac issues at that time.  Dig level was 0.5 at the time. ?  ?She was diagnosed with diabetes in May 2017. ?  ?She started medication for the DM in Jan 2019.  ? ?Having more DOE.   ? ?Denies : Chest pain. Dizziness. Leg edema. Nitroglycerin use. Orthopnea. Palpitations. Paroxysmal nocturnal dyspnea. Syncope.   ? ?Past Medical History:  ?Diagnosis Date  ? Allergic rhinitis   ? Anxiety   ? Atrial fibrillation ([place]) 10/28/2013  ? diagnoses in the 20's, well controlled with Digoxin and Inderal   ? Depression   ? Diabetes mellitus without complication ([place])   ? Gall stones   ? Heart murmur   ? Kidney disease, medullary sponge   ? Kidney stones   ? Mitral valve disorders(424.0) 10/28/2013  ? 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 MRSA (methicillin resistant Staphylococcus aureus)   ? 9/10  ?  Osteopenia   ? dexa 4/09, improved but not resolved 2011 DEXA  ? Palpitations 10/28/2013  ? Schizophrenia ([place])   ? Shingles   ? 2000  ? ? ?Past Surgical History:  ?Procedure Laterality Date  ? APPENDECTOMY  01/29/2014  ? CYSTOSCOPY W/ STONE MANIPULATION  1990's  ? ELBOW ARTHROSCOPY Left 2007  ? ELBOW ARTHROSCOPY Right 2008  ? "cyst removed; tricep"  ? ELBOW ARTHROSCOPY WITH TENDON RECONSTRUCTION Right 2005; 2006  ? EXPLORATORY LAPAROTOMY  01/29/2014  ? KNEE ARTHROSCOPY Right 2004  ? "bone chip removed"  ? KNEE ARTHROSCOPY Right 02/2009  ? "cleanup loose cartilage"  ? LAPAROTOMY N/A 01/29/2014  ? Procedure: EXPLORATORY LAPAROTOMY, SMALL BOWEL RESECTION, APPENDECTOMY;  Surgeon: Donnie Mesa, MD;  Location: [facility];  Service: General;  Laterality: N/A;  ? NASAL SINUS SURGERY  X 2  ? "for polyps"  ? SHOULDER ARTHROSCOPY Right 1990's X 2  ? SHOULDER ARTHROSCOPY W/ ROTATOR CUFF REPAIR Right   ? SMALL INTESTINE SURGERY  01/29/2014  ? ? ? ?Current Outpatient Medications  ?Medication Sig Dispense Refill  ? aspirin EC 81 MG tablet Take 81 mg by mouth daily.    ? digoxin (LANOXIN) 0.25 MG tablet Take 1 tablet (250 mcg total) by mouth every other day. Please keep upcoming appt with Dr.  Brinsmade in March 2023 before anymore refills. Thank you 3rd and Final Attempt 45 tablet 0  ? escitalopram (LEXAPRO) 20 MG tablet Take 20 mg by mouth daily.     ? Levothyroxine Sodium 88 MCG CAPS Take 88 mcg by mouth daily before breakfast.    ? metFORMIN (GLUCOPHAGE) 500 MG tablet Take 1 tablet by mouth 2 (two) times daily.  3  ? OVER THE COUNTER MEDICATION Take 1 tablet by mouth at bedtime. Estrogen supplement    ? propranolol (INDERAL) 20 MG tablet Take 1 tablet (20 mg total) by mouth 4 (four) times daily. Please keep upcoming appt in March 2023 with Dr. Irish Lack before anymore refills. Thank you Final Attempt 120 tablet 2  ? risperiDONE (RISPERDAL) 2 MG tablet Take 2 mg by mouth at bedtime.    ? rosuvastatin (CRESTOR) 5 MG tablet Take 5 mg by  mouth daily.     ? ?No current facility-administered medications for this visit.  ? ? ?Allergies:   Sulfa antibiotics  ? ? ?Social History:  The patient  reports that she has never smoked. She has never used smokeless tobacco. She reports that she does not drink alcohol and does not use drugs.  ? ?Family History:  [name] family history includes Arthritis in her father; Cataracts in her father; Dementia in her father; Diabetes Mellitus II in her mother; Hypertension in her mother.  ? ? ?ROS:  Please see the history of present illness.   Otherwise, review of systems are positive for increased DOE.   All other systems are reviewed and negative.  ? ? ?PHYSICAL EXAM: ?VS:  BP 108/66   Pulse (!) 57   Ht '5\' 7"'$  (1.702 m)   Wt 152 lb (68.9 kg)   SpO2 98%   BMI 23.81 kg/m?  , BMI Body mass index is 23.81 kg/m?. ?GEN: Well nourished, well developed, in no acute distress ?HEENT: normal ?Neck: no JVD, carotid bruits, or masses ?Cardiac: RRR; no murmurs, rubs, or gallops,; bilateral ankle edema  ?Respiratory:  clear to auscultation bilaterally, normal work of breathing ?GI: soft, nontender, nondistended, + BS ?MS: no deformity or atrophy ?Skin: warm and dry, no rash ?Neuro:  Strength and sensation are intact ?Psych: euthymic mood, full affect ? ? ?EKG:   ?The ekg ordered today demonstrates NSR, no ST changes ? ? ?Recent Labs: ?No results found for requested labs within last 8760 hours.  ? ?Lipid Panel ?No results found for: CHOL, TRIG, HDL, CHOLHDL, VLDL, LDLCALC, LDLDIRECT ?  ?Other studies Reviewed: ?Additional studies/ records that were reviewed today with results demonstrating: labs reviewed: Total cholesterol 87, HDL 36, LDL 32, triglycerides 99, creatinine 0.8. ? ? ?ASSESSMENT AND PLAN: ? ?Mitral valve prolapse: anterior leaflet prolapse with mild MR. Given some DOE, would check echocardiogram.  It has been over 8 years since she had an echo. ?Palpitations: PAF diagnosed years ago.  ?Hypothyroid: TSH normal in  February 2023 ?DM2: Whole food, plant-based diet.  High-fiber diet.  Avoid processed foods.  Taking rosuvastatin.  Lipids were well controlled.  A1c under 6.5 per her report ?Venous insufficiency: Related to age and gravity.  Elevating the legs will help.Compression stockings can help.  Minimizing salt in the diet can help.  Could consider diuretics. ?Check a trough level digoxin, prior to the next dose. ? ? ? ?Current medicines are reviewed at length with the patient today.  The patient concerns regarding her medicines were addressed. ? ?The following changes have been made:  No change ? ?  Labs/ tests ordered today include:  ?No orders of the defined types were placed in this encounter. ? ? ?Recommend 150 minutes/week of aerobic exercise ?Low fat, low carb, high fiber diet recommended ? ?Disposition:   FU in 1 year ? ? ?Signed, ?Larae Grooms, MD  ?07/26/2021 11:28 AM    ?[facility] ?[place], [place], [place]  [address] ?Phone: [phone]; Fax: [phone]  ? ?

## 2021-07-26 ENCOUNTER — Encounter: Payer: Self-pay | Admitting: Interventional Cardiology

## 2021-07-26 ENCOUNTER — Ambulatory Visit: Payer: Medicare HMO | Admitting: Interventional Cardiology

## 2021-07-26 ENCOUNTER — Other Ambulatory Visit: Payer: Self-pay

## 2021-07-26 ENCOUNTER — Other Ambulatory Visit: Payer: Self-pay | Admitting: Interventional Cardiology

## 2021-07-26 VITALS — BP 108/66 | HR 57 | Ht 67.0 in | Wt 152.0 lb

## 2021-07-26 DIAGNOSIS — I059 Rheumatic mitral valve disease, unspecified: Secondary | ICD-10-CM

## 2021-07-26 DIAGNOSIS — I480 Paroxysmal atrial fibrillation: Secondary | ICD-10-CM | POA: Diagnosis not present

## 2021-07-26 DIAGNOSIS — Z79899 Other long term (current) drug therapy: Secondary | ICD-10-CM | POA: Diagnosis not present

## 2021-07-26 DIAGNOSIS — F209 Schizophrenia, unspecified: Secondary | ICD-10-CM | POA: Diagnosis not present

## 2021-07-26 DIAGNOSIS — E119 Type 2 diabetes mellitus without complications: Secondary | ICD-10-CM | POA: Diagnosis not present

## 2021-07-26 DIAGNOSIS — E039 Hypothyroidism, unspecified: Secondary | ICD-10-CM

## 2021-07-26 MED ORDER — PROPRANOLOL HCL 20 MG PO TABS: 20.0000 mg | ORAL_TABLET | Freq: Four times a day (QID) | ORAL | 3 refills | Status: DC

## 2021-07-26 MED ORDER — DIGOXIN 250 MCG PO TABS: 250.0000 ug | ORAL_TABLET | ORAL | 3 refills | Status: DC

## 2021-07-26 NOTE — Patient Instructions (Signed)
Medication Instructions:  ?Your physician recommends that you continue on your current medications as directed. Please refer to the Current Medication list given to you today. ? ?*If you need a refill on your cardiac medications before your next appointment, please call your pharmacy* ? ? ?Lab Work: ?Your physician recommends that you return for lab work on July 29, 2021.  Do not take digoxin that day until after lab work is drawn ? ?If you have labs (blood work) drawn today and your tests are completely normal, you will receive your results only by: ?MyChart Message (if you have MyChart) OR ?A paper copy in the mail ?If you have any lab test that is abnormal or we need to change your treatment, we will call you to review the results. ? ? ?Testing/Procedures: ?Your physician has requested that you have an echocardiogram. Echocardiography is a painless test that uses sound waves to create images of your heart. It provides your doctor with information about the size and shape of your heart and how well your heart?s chambers and valves are working. This procedure takes approximately one hour. There are no restrictions for this procedure. ? ? ? ?Follow-Up: ?At [facility], you and your health needs are our priority.  As part of our continuing mission to provide you with exceptional heart care, we have created designated Provider Care Teams.  These Care Teams include your primary Cardiologist (physician) and Advanced Practice Providers (APPs -  Physician Assistants and Nurse Practitioners) who all work together to provide you with the care you need, when you need it. ? ?We recommend signing up for the patient portal called "MyChart".  Sign up information is provided on this After Visit Summary.  MyChart is used to connect with patients for Virtual Visits (Telemedicine).  Patients are able to view lab/test results, encounter notes, upcoming appointments, etc.  Non-urgent messages can be sent to your provider as well.    ?To learn more about what you can do with MyChart, go to [email or web address].   ? ?Your next appointment:   ?12 month(s) ? ?The format for your next appointment:   ?In Person ? ?Provider:   ?Larae Grooms, MD   ? ? ?Other Instructions ? ? ?

## 2021-07-29 ENCOUNTER — Other Ambulatory Visit: Payer: Self-pay

## 2021-07-29 ENCOUNTER — Other Ambulatory Visit: Payer: Medicare HMO | Admitting: *Deleted

## 2021-07-29 DIAGNOSIS — I480 Paroxysmal atrial fibrillation: Secondary | ICD-10-CM

## 2021-07-29 DIAGNOSIS — Z79899 Other long term (current) drug therapy: Secondary | ICD-10-CM | POA: Diagnosis not present

## 2021-07-30 LAB — DIGOXIN LEVEL: Digoxin, Serum: <0.4 ng/mL — ABNORMAL LOW (ref 0.5–0.9)

## 2021-08-01 ENCOUNTER — Ambulatory Visit (HOSPITAL_COMMUNITY): Payer: Medicare HMO | Attending: Cardiology

## 2021-08-01 ENCOUNTER — Other Ambulatory Visit: Payer: Self-pay

## 2021-08-01 DIAGNOSIS — I480 Paroxysmal atrial fibrillation: Secondary | ICD-10-CM | POA: Diagnosis not present

## 2021-08-01 DIAGNOSIS — I059 Rheumatic mitral valve disease, unspecified: Secondary | ICD-10-CM | POA: Diagnosis not present

## 2021-08-01 DIAGNOSIS — I340 Nonrheumatic mitral (valve) insufficiency: Secondary | ICD-10-CM

## 2021-08-01 DIAGNOSIS — I341 Nonrheumatic mitral (valve) prolapse: Secondary | ICD-10-CM

## 2021-08-01 LAB — ECHOCARDIOGRAM COMPLETE
Area-P 1/2: 3.07 cm2
S' Lateral: 2.7 cm

## 2022-01-03 DIAGNOSIS — F411 Generalized anxiety disorder: Secondary | ICD-10-CM | POA: Diagnosis not present

## 2022-01-03 DIAGNOSIS — F3342 Major depressive disorder, recurrent, in full remission: Secondary | ICD-10-CM | POA: Diagnosis not present

## 2022-04-20 DIAGNOSIS — E7800 Pure hypercholesterolemia, unspecified: Secondary | ICD-10-CM | POA: Diagnosis not present

## 2022-04-20 DIAGNOSIS — E02 Subclinical iodine-deficiency hypothyroidism: Secondary | ICD-10-CM | POA: Diagnosis not present

## 2022-04-20 DIAGNOSIS — E119 Type 2 diabetes mellitus without complications: Secondary | ICD-10-CM | POA: Diagnosis not present

## 2022-04-20 DIAGNOSIS — E785 Hyperlipidemia, unspecified: Secondary | ICD-10-CM | POA: Diagnosis not present

## 2022-04-27 DIAGNOSIS — E7800 Pure hypercholesterolemia, unspecified: Secondary | ICD-10-CM | POA: Diagnosis not present

## 2022-04-27 DIAGNOSIS — E785 Hyperlipidemia, unspecified: Secondary | ICD-10-CM | POA: Diagnosis not present

## 2022-04-27 DIAGNOSIS — E119 Type 2 diabetes mellitus without complications: Secondary | ICD-10-CM | POA: Diagnosis not present

## 2022-04-27 DIAGNOSIS — E02 Subclinical iodine-deficiency hypothyroidism: Secondary | ICD-10-CM | POA: Diagnosis not present

## 2022-05-10 DIAGNOSIS — R1011 Right upper quadrant pain: Secondary | ICD-10-CM | POA: Diagnosis not present

## 2022-05-10 DIAGNOSIS — K838 Other specified diseases of biliary tract: Secondary | ICD-10-CM | POA: Diagnosis not present

## 2022-05-10 DIAGNOSIS — K8020 Calculus of gallbladder without cholecystitis without obstruction: Secondary | ICD-10-CM | POA: Diagnosis not present

## 2022-05-10 DIAGNOSIS — N200 Calculus of kidney: Secondary | ICD-10-CM | POA: Diagnosis not present

## 2022-05-10 DIAGNOSIS — K760 Fatty (change of) liver, not elsewhere classified: Secondary | ICD-10-CM | POA: Diagnosis not present

## 2022-05-25 DIAGNOSIS — E119 Type 2 diabetes mellitus without complications: Secondary | ICD-10-CM | POA: Diagnosis not present

## 2022-06-27 DIAGNOSIS — F3342 Major depressive disorder, recurrent, in full remission: Secondary | ICD-10-CM | POA: Diagnosis not present

## 2022-06-27 DIAGNOSIS — F411 Generalized anxiety disorder: Secondary | ICD-10-CM | POA: Diagnosis not present

## 2022-07-11 DIAGNOSIS — Z0001 Encounter for general adult medical examination with abnormal findings: Secondary | ICD-10-CM | POA: Diagnosis not present

## 2022-07-11 DIAGNOSIS — E7800 Pure hypercholesterolemia, unspecified: Secondary | ICD-10-CM | POA: Diagnosis not present

## 2022-07-11 DIAGNOSIS — Z79899 Other long term (current) drug therapy: Secondary | ICD-10-CM | POA: Diagnosis not present

## 2022-07-11 DIAGNOSIS — E119 Type 2 diabetes mellitus without complications: Secondary | ICD-10-CM | POA: Diagnosis not present

## 2022-07-11 DIAGNOSIS — E039 Hypothyroidism, unspecified: Secondary | ICD-10-CM | POA: Diagnosis not present

## 2022-07-11 DIAGNOSIS — D6869 Other thrombophilia: Secondary | ICD-10-CM | POA: Diagnosis not present

## 2022-07-11 DIAGNOSIS — I480 Paroxysmal atrial fibrillation: Secondary | ICD-10-CM | POA: Diagnosis not present

## 2022-07-11 DIAGNOSIS — F321 Major depressive disorder, single episode, moderate: Secondary | ICD-10-CM | POA: Diagnosis not present

## 2022-07-22 ENCOUNTER — Other Ambulatory Visit: Payer: Self-pay | Admitting: Interventional Cardiology

## 2022-08-18 ENCOUNTER — Other Ambulatory Visit: Payer: Self-pay | Admitting: Interventional Cardiology

## 2022-09-05 ENCOUNTER — Other Ambulatory Visit: Payer: Self-pay | Admitting: Interventional Cardiology

## 2022-09-08 ENCOUNTER — Telehealth: Payer: Self-pay | Admitting: Interventional Cardiology

## 2022-09-08 MED ORDER — PROPRANOLOL HCL 20 MG PO TABS: 20.0000 mg | ORAL_TABLET | Freq: Four times a day (QID) | ORAL | 1 refills | Status: DC

## 2022-09-08 MED ORDER — DIGOXIN 250 MCG PO TABS: 250.0000 ug | ORAL_TABLET | ORAL | 1 refills | Status: DC

## 2022-09-08 NOTE — Telephone Encounter (Signed)
Pt's medications were sent to pt's pharmacy as requested. Confirmation received.  

## 2022-09-08 NOTE — Telephone Encounter (Signed)
*  STAT* If patient is at the pharmacy, call can be transferred to refill team.   1. Which medications need to be refilled? (please list name of each medication and dose if known)   digoxin (LANOXIN) 0.25 MG tablet  propranolol (INDERAL) 20 MG tablet   2. Which pharmacy/location (including street and city if local pharmacy) is medication to be sent to?  [id number] - [place], [place] - [address]    3. Do they need a 30 day or 90 day supply? 90 day

## 2022-11-03 ENCOUNTER — Ambulatory Visit: Payer: Medicare HMO | Attending: Interventional Cardiology | Admitting: Interventional Cardiology

## 2022-11-03 ENCOUNTER — Encounter: Payer: Self-pay | Admitting: Interventional Cardiology

## 2022-11-03 VITALS — BP 126/76 | HR 59 | Ht 67.0 in | Wt 151.2 lb

## 2022-11-03 DIAGNOSIS — I480 Paroxysmal atrial fibrillation: Secondary | ICD-10-CM

## 2022-11-03 DIAGNOSIS — E119 Type 2 diabetes mellitus without complications: Secondary | ICD-10-CM

## 2022-11-03 DIAGNOSIS — I059 Rheumatic mitral valve disease, unspecified: Secondary | ICD-10-CM

## 2022-11-03 NOTE — Patient Instructions (Signed)
Medication Instructions:  Your physician recommends that you continue on your current medications as directed. Please refer to the Current Medication list given to you today.  *If you need a refill on your cardiac medications before your next appointment, please call your pharmacy*   Lab Work: Lab work to be done today--Digoxin level If you have labs (blood work) drawn today and your tests are completely normal, you will receive your results only by: MyChart Message (if you have MyChart) OR A paper copy in the mail If you have any lab test that is abnormal or we need to change your treatment, we will call you to review the results.   Testing/Procedures:  none   Follow-Up: At [facility], you and your health needs are our priority.  As part of our continuing mission to provide you with exceptional heart care, we have created designated Provider Care Teams.  These Care Teams include your primary Cardiologist (physician) and Advanced Practice Providers (APPs -  Physician Assistants and Nurse Practitioners) who all work together to provide you with the care you need, when you need it.  We recommend signing up for the patient portal called "MyChart".  Sign up information is provided on this After Visit Summary.  MyChart is used to connect with patients for Virtual Visits (Telemedicine).  Patients are able to view lab/test results, encounter notes, upcoming appointments, etc.  Non-urgent messages can be sent to your provider as well.   To learn more about what you can do with MyChart, go to [email or web address].    Your next appointment:   12 month(s)  Provider:   Lance Muss, MD     Other Instructions

## 2022-11-03 NOTE — Progress Notes (Signed)
Cardiology Office Note   Date:  11/03/2022   ID:  Km Weatherbee, DOB 04-26-1946, MRN [id number]  PCP:  Darrow Bussing, MD    No chief complaint on file.  MVP  Wt Readings from Last 3 Encounters:  11/03/22 151 lb 3.2 oz (68.6 kg)  07/26/21 152 lb (68.9 kg)  02/17/20 160 lb 6.4 oz (72.8 kg)       History of Present Illness: Deanna Shea is a 77 y.o. female  who had palpitations since she was 30 years. She went on inderal and lanoxin and has been on that combination for many years. When they tried to take her of or change the combination, she had her sx back. The diagnosis intially was mitral valve prolapse with atrial fibrillation.  I have never seen a strip of her atrial fibrillation.   Coronary CTA showed 0 calcium score in 2013.  Palpitations felt like a skipped beat lasting a second. She was in [place] at the time. Echocardiogram was in 5/14 showing anterior leaflet prolapse, with mild MR.    Unfortunately, her husband passed away from metastatic parotid gland cancer in October 2014.   She had a ruptured appendix in 9/15 and tolerated the hospitalization well.  No cardiac issues at that time.  Dig level was 0.5 at the time.   She was diagnosed with diabetes in May 2017.   She started medication for the DM in Jan 2019.   One fall in a parking lot.  One fall in her bedroom while putting on her pants.   Walks for exercise. Skips it in the very hot weather.   Denies : Chest pain. Dizziness. Leg edema. Nitroglycerin use. Orthopnea. Palpitations. Paroxysmal nocturnal dyspnea. Shortness of breath. Syncope.      Past Medical History:  Diagnosis Date   Allergic rhinitis    Anxiety    Atrial fibrillation ([facility]) 10/28/2013   diagnoses in the 20's, well controlled with Digoxin and Inderal    Depression    Diabetes mellitus without complication ([facility])    Gall stones    Heart murmur    Kidney disease, medullary sponge    Kidney stones    Mitral valve disorders(424.0)  10/28/2013   Mitral valve prolapse    congenital    MRSA (methicillin resistant Staphylococcus aureus)    9/10   Osteopenia    dexa 4/09, improved but not resolved 2011 DEXA   Palpitations 10/28/2013   Schizophrenia ([facility])    Shingles    2000    Past Surgical History:  Procedure Laterality Date   APPENDECTOMY  01/29/2014   CYSTOSCOPY W/ STONE MANIPULATION  1990's   ELBOW ARTHROSCOPY Left 2007   ELBOW ARTHROSCOPY Right 2008   "cyst removed; tricep"   ELBOW ARTHROSCOPY WITH TENDON RECONSTRUCTION Right 2005; 2006   EXPLORATORY LAPAROTOMY  01/29/2014   KNEE ARTHROSCOPY Right 2004   "bone chip removed"   KNEE ARTHROSCOPY Right 02/2009   "cleanup loose cartilage"   LAPAROTOMY N/A 01/29/2014   Procedure: EXPLORATORY LAPAROTOMY, SMALL BOWEL RESECTION, APPENDECTOMY;  Surgeon: Manus Rudd, MD;  Location: [facility];  Service: General;  Laterality: N/A;   NASAL SINUS SURGERY  X 2   "for polyps"   SHOULDER ARTHROSCOPY Right 1990's X 2   SHOULDER ARTHROSCOPY W/ ROTATOR CUFF REPAIR Right    SMALL INTESTINE SURGERY  01/29/2014     Current Outpatient Medications  Medication Sig Dispense Refill   aspirin EC 81 MG tablet Take 81 mg by mouth daily.  digoxin (LANOXIN) 0.25 MG tablet Take 1 tablet (250 mcg total) by mouth every other day. 45 tablet 1   escitalopram (LEXAPRO) 20 MG tablet Take 20 mg by mouth daily.      Levothyroxine Sodium 88 MCG CAPS Take 88 mcg by mouth daily before breakfast.     metFORMIN (GLUCOPHAGE) 500 MG tablet Take 1 tablet by mouth 2 (two) times daily.  3   OVER THE COUNTER MEDICATION Take 1 tablet by mouth at bedtime. Estrogen supplement     propranolol (INDERAL) 20 MG tablet Take 1 tablet (20 mg total) by mouth 4 (four) times daily. 360 tablet 1   risperiDONE (RISPERDAL) 2 MG tablet Take 2 mg by mouth at bedtime.     rosuvastatin (CRESTOR) 5 MG tablet Take 5 mg by mouth daily.      No current facility-administered medications for this visit.    Allergies:   Sulfa  antibiotics    Social History:  The patient  reports that she has never smoked. She has never used smokeless tobacco. She reports that she does not drink alcohol and does not use drugs.   Family History:  [name] family history includes Arthritis in her father; Cataracts in her father; Dementia in her father; Diabetes Mellitus II in her mother; Hypertension in her mother.    ROS:  Please see the history of present illness.   Otherwise, review of systems are positive for balance issues.   All other systems are reviewed and negative.    PHYSICAL EXAM: VS:  BP 126/76   Pulse (!) 59   Ht 5\' 7"  (1.702 m)   Wt 151 lb 3.2 oz (68.6 kg)   SpO2 97%   BMI 23.68 kg/m  , BMI Body mass index is 23.68 kg/m. GEN: Well nourished, well developed, in no acute distress HEENT: normal Neck: no JVD, carotid bruits, or masses Cardiac: RRR; no murmurs, rubs, or gallops,no edema  Respiratory:  clear to auscultation bilaterally, normal work of breathing GI: soft, nontender, nondistended, + BS MS: no deformity or atrophy Skin: warm and dry, no rash Neuro:  Strength and sensation are intact Psych: euthymic mood, full affect   EKG:   The ekg ordered today demonstrates NSR, no ST changes   Recent Labs: No results found for requested labs within last 365 days.   Lipid Panel No results found for: "CHOL", "TRIG", "HDL", "CHOLHDL", "VLDL", "LDLCALC", "LDLDIRECT"   Other studies Reviewed: Additional studies/ records that were reviewed today with results demonstrating: Dig level < 0.4 in 3/23.   ASSESSMENT AND PLAN:  Mitral valve prolapse: Anterior leaflet prolapse with mild MR.  No CHF sx.  Appears euvolemic.  Needs to avoid falling.  Palpitations: PAF diagnosed years ago but I have not seen a strip of atrial fibrillation.  No anticoagulation.  Tolerating aspirin.  Especially given balance issues, would continue aspirin and avoid stronger blood thinners. Type 2 diabetes: A1c 6.0 in March 2024.   LDL 26 HDL 32 total cholesterol 84 triglycerides 157 on rosuvastatin.  Continue low-dose rosuvastatin. Venous insufficiency: Leg elevation. Digoxin level today.  This will be more like a peak digoxin level since the pill was taken this morning.   Current medicines are reviewed at length with the patient today.  The patient concerns regarding her medicines were addressed.  The following changes have been made:  No change  Labs/ tests ordered today include:   Orders Placed This Encounter  Procedures   EKG 12-Lead    Recommend 150  minutes/week of aerobic exercise Low fat, low carb, high fiber diet recommended  Disposition:   FU in 1 year   Signed, Lance Muss, MD  11/03/2022 11:02 AM    [facility] [address] [place], [place], [place]  [address] Phone: [phone]; Fax: [phone]

## 2022-11-04 LAB — DIGOXIN LEVEL: Digoxin, Serum: 1.7 ng/mL — ABNORMAL HIGH (ref 0.5–0.9)

## 2022-11-06 DIAGNOSIS — E119 Type 2 diabetes mellitus without complications: Secondary | ICD-10-CM | POA: Diagnosis not present

## 2022-11-06 DIAGNOSIS — E02 Subclinical iodine-deficiency hypothyroidism: Secondary | ICD-10-CM | POA: Diagnosis not present

## 2022-11-06 DIAGNOSIS — E7800 Pure hypercholesterolemia, unspecified: Secondary | ICD-10-CM | POA: Diagnosis not present

## 2022-11-06 DIAGNOSIS — E785 Hyperlipidemia, unspecified: Secondary | ICD-10-CM | POA: Diagnosis not present

## 2022-11-07 ENCOUNTER — Telehealth: Payer: Self-pay | Admitting: *Deleted

## 2022-11-07 DIAGNOSIS — I480 Paroxysmal atrial fibrillation: Secondary | ICD-10-CM

## 2022-11-07 DIAGNOSIS — Z79899 Other long term (current) drug therapy: Secondary | ICD-10-CM

## 2022-11-07 MED ORDER — DIGOXIN 250 MCG PO TABS: ORAL_TABLET | ORAL | 3 refills | Status: DC

## 2022-11-07 NOTE — Telephone Encounter (Signed)
Left message to call office

## 2022-11-07 NOTE — Telephone Encounter (Signed)
Patient notified.  [facility] [address].  She will come in for lab work on August 2.  She will not take digoxin that day until after lab work has been done

## 2022-11-07 NOTE — Telephone Encounter (Signed)
-----   Message from Corky Crafts, MD sent at 11/04/2022 11:43 AM EDT ----- Upper normal Dig level.  She has lost weight and perhaps does not need the same amount of medicine.  WOuld change Dig 0.25 mg from every other day to 0.25 mg three times /week (M,W,F).  Can recheck a Dig level and BMet in 1 month on a day when she is not taking the medicine.

## 2022-11-07 NOTE — Telephone Encounter (Signed)
Patient returned RN's call. 

## 2022-11-13 DIAGNOSIS — E785 Hyperlipidemia, unspecified: Secondary | ICD-10-CM | POA: Diagnosis not present

## 2022-11-13 DIAGNOSIS — E7800 Pure hypercholesterolemia, unspecified: Secondary | ICD-10-CM | POA: Diagnosis not present

## 2022-11-13 DIAGNOSIS — E119 Type 2 diabetes mellitus without complications: Secondary | ICD-10-CM | POA: Diagnosis not present

## 2022-11-13 DIAGNOSIS — E02 Subclinical iodine-deficiency hypothyroidism: Secondary | ICD-10-CM | POA: Diagnosis not present

## 2022-11-29 ENCOUNTER — Ambulatory Visit: Payer: Medicare HMO | Admitting: Podiatry

## 2022-12-08 ENCOUNTER — Ambulatory Visit: Payer: Medicare HMO

## 2022-12-13 ENCOUNTER — Ambulatory Visit: Payer: Medicare HMO | Admitting: Podiatry

## 2023-01-02 ENCOUNTER — Emergency Department (HOSPITAL_BASED_OUTPATIENT_CLINIC_OR_DEPARTMENT_OTHER): Payer: Medicare HMO

## 2023-01-02 ENCOUNTER — Inpatient Hospital Stay (HOSPITAL_BASED_OUTPATIENT_CLINIC_OR_DEPARTMENT_OTHER)
Admission: EM | Admit: 2023-01-02 | Discharge: 2023-01-09 | DRG: 565 | Disposition: A | Discharge status: Skilled Nursing Facility | Payer: Medicare HMO | Attending: Internal Medicine | Admitting: Internal Medicine

## 2023-01-02 ENCOUNTER — Other Ambulatory Visit: Payer: Self-pay

## 2023-01-02 DIAGNOSIS — I959 Hypotension, unspecified: Secondary | ICD-10-CM | POA: Diagnosis not present

## 2023-01-02 DIAGNOSIS — E039 Hypothyroidism, unspecified: Secondary | ICD-10-CM | POA: Diagnosis present

## 2023-01-02 DIAGNOSIS — Z7984 Long term (current) use of oral hypoglycemic drugs: Secondary | ICD-10-CM

## 2023-01-02 DIAGNOSIS — T796XXA Traumatic ischemia of muscle, initial encounter: Secondary | ICD-10-CM | POA: Diagnosis not present

## 2023-01-02 DIAGNOSIS — Z8261 Family history of arthritis: Secondary | ICD-10-CM

## 2023-01-02 DIAGNOSIS — N179 Acute kidney failure, unspecified: Secondary | ICD-10-CM | POA: Diagnosis present

## 2023-01-02 DIAGNOSIS — K8020 Calculus of gallbladder without cholecystitis without obstruction: Secondary | ICD-10-CM | POA: Diagnosis not present

## 2023-01-02 DIAGNOSIS — Z882 Allergy status to sulfonamides status: Secondary | ICD-10-CM | POA: Diagnosis not present

## 2023-01-02 DIAGNOSIS — M48061 Spinal stenosis, lumbar region without neurogenic claudication: Secondary | ICD-10-CM | POA: Diagnosis not present

## 2023-01-02 DIAGNOSIS — I480 Paroxysmal atrial fibrillation: Secondary | ICD-10-CM | POA: Diagnosis present

## 2023-01-02 DIAGNOSIS — M5137 Other intervertebral disc degeneration, lumbosacral region: Secondary | ICD-10-CM | POA: Diagnosis not present

## 2023-01-02 DIAGNOSIS — N200 Calculus of kidney: Secondary | ICD-10-CM | POA: Diagnosis not present

## 2023-01-02 DIAGNOSIS — F209 Schizophrenia, unspecified: Secondary | ICD-10-CM | POA: Diagnosis present

## 2023-01-02 DIAGNOSIS — Y92009 Unspecified place in unspecified non-institutional (private) residence as the place of occurrence of the external cause: Secondary | ICD-10-CM | POA: Diagnosis not present

## 2023-01-02 DIAGNOSIS — M6282 Rhabdomyolysis: Secondary | ICD-10-CM | POA: Diagnosis present

## 2023-01-02 DIAGNOSIS — I341 Nonrheumatic mitral (valve) prolapse: Secondary | ICD-10-CM | POA: Diagnosis not present

## 2023-01-02 DIAGNOSIS — Z79899 Other long term (current) drug therapy: Secondary | ICD-10-CM | POA: Diagnosis not present

## 2023-01-02 DIAGNOSIS — R2681 Unsteadiness on feet: Secondary | ICD-10-CM | POA: Diagnosis not present

## 2023-01-02 DIAGNOSIS — N202 Calculus of kidney with calculus of ureter: Secondary | ICD-10-CM | POA: Diagnosis not present

## 2023-01-02 DIAGNOSIS — I6782 Cerebral ischemia: Secondary | ICD-10-CM | POA: Diagnosis not present

## 2023-01-02 DIAGNOSIS — R000 Tachycardia, unspecified: Secondary | ICD-10-CM | POA: Diagnosis not present

## 2023-01-02 DIAGNOSIS — M25552 Pain in left hip: Secondary | ICD-10-CM | POA: Diagnosis not present

## 2023-01-02 DIAGNOSIS — R197 Diarrhea, unspecified: Secondary | ICD-10-CM | POA: Diagnosis present

## 2023-01-02 DIAGNOSIS — F32A Depression, unspecified: Secondary | ICD-10-CM | POA: Diagnosis not present

## 2023-01-02 DIAGNOSIS — R531 Weakness: Secondary | ICD-10-CM | POA: Diagnosis present

## 2023-01-02 DIAGNOSIS — Z602 Problems related to living alone: Secondary | ICD-10-CM | POA: Diagnosis present

## 2023-01-02 DIAGNOSIS — Z8249 Family history of ischemic heart disease and other diseases of the circulatory system: Secondary | ICD-10-CM | POA: Diagnosis not present

## 2023-01-02 DIAGNOSIS — W010XXA Fall on same level from slipping, tripping and stumbling without subsequent striking against object, initial encounter: Secondary | ICD-10-CM | POA: Diagnosis present

## 2023-01-02 DIAGNOSIS — E119 Type 2 diabetes mellitus without complications: Secondary | ICD-10-CM | POA: Diagnosis present

## 2023-01-02 DIAGNOSIS — Z833 Family history of diabetes mellitus: Secondary | ICD-10-CM | POA: Diagnosis not present

## 2023-01-02 DIAGNOSIS — M8580 Other specified disorders of bone density and structure, unspecified site: Secondary | ICD-10-CM | POA: Diagnosis present

## 2023-01-02 DIAGNOSIS — S79912A Unspecified injury of left hip, initial encounter: Secondary | ICD-10-CM | POA: Diagnosis not present

## 2023-01-02 DIAGNOSIS — Z743 Need for continuous supervision: Secondary | ICD-10-CM | POA: Diagnosis not present

## 2023-01-02 DIAGNOSIS — Z7982 Long term (current) use of aspirin: Secondary | ICD-10-CM | POA: Diagnosis not present

## 2023-01-02 DIAGNOSIS — R935 Abnormal findings on diagnostic imaging of other abdominal regions, including retroperitoneum: Secondary | ICD-10-CM | POA: Diagnosis not present

## 2023-01-02 DIAGNOSIS — E785 Hyperlipidemia, unspecified: Secondary | ICD-10-CM | POA: Diagnosis present

## 2023-01-02 DIAGNOSIS — F419 Anxiety disorder, unspecified: Secondary | ICD-10-CM | POA: Diagnosis present

## 2023-01-02 DIAGNOSIS — M6281 Muscle weakness (generalized): Secondary | ICD-10-CM | POA: Diagnosis not present

## 2023-01-02 DIAGNOSIS — M1612 Unilateral primary osteoarthritis, left hip: Secondary | ICD-10-CM | POA: Diagnosis not present

## 2023-01-02 DIAGNOSIS — R41841 Cognitive communication deficit: Secondary | ICD-10-CM | POA: Diagnosis not present

## 2023-01-02 LAB — CBC WITH DIFFERENTIAL/PLATELET
Abs Immature Granulocytes: 0.06 10*3/uL (ref 0.00–0.07)
Basophils Absolute: 0 10*3/uL (ref 0.0–0.1)
Basophils Relative: 0 %
Eosinophils Absolute: 0 10*3/uL (ref 0.0–0.5)
Eosinophils Relative: 0 %
HCT: 47.6 % — ABNORMAL HIGH (ref 36.0–46.0)
Hemoglobin: 15.6 g/dL — ABNORMAL HIGH (ref 12.0–15.0)
Immature Granulocytes: 1 %
Lymphocytes Relative: 8 %
Lymphs Abs: 0.9 10*3/uL (ref 0.7–4.0)
MCH: 29.9 pg (ref 26.0–34.0)
MCHC: 32.8 g/dL (ref 30.0–36.0)
MCV: 91.2 fL (ref 80.0–100.0)
Monocytes Absolute: 0.7 10*3/uL (ref 0.1–1.0)
Monocytes Relative: 6 %
Neutro Abs: 9.1 10*3/uL — ABNORMAL HIGH (ref 1.7–7.7)
Neutrophils Relative %: 85 %
Platelets: 326 10*3/uL (ref 150–400)
RBC: 5.22 MIL/uL — ABNORMAL HIGH (ref 3.87–5.11)
RDW: 13.9 % (ref 11.5–15.5)
WBC: 10.7 10*3/uL — ABNORMAL HIGH (ref 4.0–10.5)
nRBC: 0 % (ref 0.0–0.2)

## 2023-01-02 LAB — TROPONIN I (HIGH SENSITIVITY): Troponin I (High Sensitivity): 15 ng/L (ref <18)

## 2023-01-02 LAB — BASIC METABOLIC PANEL
Anion gap: 15 (ref 5–15)
BUN: 46 mg/dL — ABNORMAL HIGH (ref 8–23)
CO2: 27 mmol/L (ref 22–32)
Calcium: 9.9 mg/dL (ref 8.9–10.3)
Chloride: 102 mmol/L (ref 98–111)
Creatinine, Ser: 0.98 mg/dL (ref 0.44–1.00)
GFR, Estimated: >60 mL/min (ref >60)
Glucose, Bld: 205 mg/dL — ABNORMAL HIGH (ref 70–99)
Potassium: 3.6 mmol/L (ref 3.5–5.1)
Sodium: 144 mmol/L (ref 135–145)

## 2023-01-02 LAB — CK: Total CK: 2675 U/L — ABNORMAL HIGH (ref 38–234)

## 2023-01-02 LAB — MAGNESIUM: Magnesium: 2.3 mg/dL (ref 1.7–2.4)

## 2023-01-02 MED ORDER — SODIUM CHLORIDE 0.9 % IV BOLUS
1000.0000 mL | Freq: Once | INTRAVENOUS | Status: AC
  Administered 2023-01-03: 1000 mL via INTRAVENOUS

## 2023-01-02 NOTE — ED Provider Notes (Signed)
[place] [facility]ERGENC[facility] DEPARTMENT AT [facility] Provider Note   CSN: [id number] Arrival date & time: 01/02/23  2153     History  Chief Complaint  Patient presents with   Deanna Shea is a 77 y.o. female.  Patient is a 77 year old female with past medical history of type 2 diabetes, hyperlipidemia, atrial fibrillation on aspirin.  Patient presenting today for evaluation of a fall.  According to son and daughter-in-law at bedside, she missed a scheduled phone call this evening.  She was unable to be reached, so family went to check on her.  She was found laying on the floor.  Patient believes she felt 2 days ago and has been unable to get up since.  She complains of pain in her low back and left hip.  She was able to ambulate with family assistance, then was brought here for further evaluation.  The history is provided by the patient.       Home Medications Prior to Admission medications   Medication Sig Start Date End Date Taking? Authorizing Provider  aspirin EC 81 MG tablet Take 81 mg by mouth daily.    [provider]  digoxin (LANOXIN) 0.25 MG tablet Take one tablet by mouth on Mon, Wed and Friday 11/07/22   Corky Crafts, MD  escitalopram (LEXAPRO) 20 MG tablet Take 20 mg by mouth daily.  10/08/15   [provider]  Levothyroxine Sodium 88 MCG CAPS Take 88 mcg by mouth daily before breakfast. 11/27/19   [provider]  metFORMIN (GLUCOPHAGE) 500 MG tablet Take 1 tablet by mouth 2 (two) times daily. 05/15/17   [provider]  OVER THE COUNTER MEDICATION Take 1 tablet by mouth at bedtime. Estrogen supplement    [provider]  propranolol (INDERAL) 20 MG tablet Take 1 tablet (20 mg total) by mouth 4 (four) times daily. 09/08/22   Corky Crafts, MD  risperiDONE (RISPERDAL) 2 MG tablet Take 2 mg by mouth at bedtime.    [provider]  rosuvastatin (CRESTOR) 5 MG tablet Take 5 mg by mouth daily.   11/30/19   [provider]      Allergies    Sulfa antibiotics    Review of Systems   Review of Systems  All other systems reviewed and are negative.   Physical Exam Updated Vital Signs BP 96/78 (BP Location: Left Arm)   Pulse (!) 141   Temp 97.9 F (36.6 C)   Resp 19   Ht 5\' 7"  (1.702 m)   Wt 73 kg   SpO2 96%   BMI 25.22 kg/m  Physical Exam Vitals and nursing note reviewed.  Constitutional:      General: She is not in acute distress.    Appearance: She is well-developed. She is not diaphoretic.  HENT:     Head: Normocephalic and atraumatic.  Cardiovascular:     Rate and Rhythm: Normal rate and regular rhythm.     Heart sounds: No murmur heard.    No friction rub. No gallop.  Pulmonary:     Effort: Pulmonary effort is normal. No respiratory distress.     Breath sounds: Normal breath sounds. No wheezing.  Abdominal:     General: Bowel sounds are normal. There is no distension.     Palpations: Abdomen is soft.     Tenderness: There is no abdominal tenderness.  Musculoskeletal:        General: Normal range of motion.  Cervical back: Normal range of motion and neck supple.     Comments: There is mild tenderness in the soft tissues of the lower lumbar region.  There is no bony tenderness or step-off.  There is an indentation noted to the lateral aspect of the left hip with surrounding erythema.  This appears to be a pressure mark from laying on her hip for prolonged period of time.  Skin:    General: Skin is warm and dry.  Neurological:     General: No focal deficit present.     Mental Status: She is alert and oriented to person, place, and time. Mental status is at baseline.     Cranial Nerves: No cranial nerve deficit.     Motor: No weakness.     ED Results / Procedures / Treatments   Labs (all labs ordered are listed, but only abnormal results are displayed) Labs Reviewed  BASIC METABOLIC PANEL - Abnormal; Notable for the following components:       Result Value   Glucose, Bld 205 (*)    BUN 46 (*)    All other components within normal limits  CBC WITH DIFFERENTIAL/PLATELET - Abnormal; Notable for the following components:   WBC 10.7 (*)    RBC 5.22 (*)    Hemoglobin 15.6 (*)    HCT 47.6 (*)    Neutro Abs 9.1 (*)    All other components within normal limits  MAGNESIUM  CK  TROPONIN I (HIGH SENSITIVITY)    EKG None  Radiology No results found.  Procedures Procedures  {Document cardiac monitor, telemetry assessment procedure when appropriate:1}  Medications Ordered in ED Medications  sodium chloride 0.9 % bolus 1,000 mL (has no administration in time range)    ED Course/ Medical Decision Making/ A&P   {   Click here for ABCD2, HEART and other calculatorsREFRESH Note before signing :1}                              Medical Decision Making Amount and/or Complexity of Data Reviewed Radiology: ordered.   ***  {Document critical care time when appropriate:1} {Document review of labs and clinical decision tools ie heart score, Chads2Vasc2 etc:1}  {Document your independent review of radiology images, and any outside records:1} {Document your discussion with family members, caretakers, and with consultants:1} {Document social determinants of health affecting pt's care:1} {Document your decision making why or why not admission, treatments were needed:1} Final Clinical Impression(s) / ED Diagnoses Final diagnoses:  None    Rx / DC Orders ED Discharge Orders     None

## 2023-01-02 NOTE — ED Triage Notes (Addendum)
Pt to ED accompanied with son and daughter in law. Apparently pt has a scheduled call with family every Tuesday , family was unable to reach pt, resulting in going to home and finding pt on the floor covered in urine. Pt ambulatory. Unknown exactly how long pt was on ground pt , reports was unable to get up . Pt c/o back pain.

## 2023-01-03 DIAGNOSIS — F419 Anxiety disorder, unspecified: Secondary | ICD-10-CM | POA: Diagnosis present

## 2023-01-03 DIAGNOSIS — I341 Nonrheumatic mitral (valve) prolapse: Secondary | ICD-10-CM | POA: Diagnosis present

## 2023-01-03 DIAGNOSIS — W010XXA Fall on same level from slipping, tripping and stumbling without subsequent striking against object, initial encounter: Secondary | ICD-10-CM | POA: Diagnosis present

## 2023-01-03 DIAGNOSIS — Z8261 Family history of arthritis: Secondary | ICD-10-CM | POA: Diagnosis not present

## 2023-01-03 DIAGNOSIS — F209 Schizophrenia, unspecified: Secondary | ICD-10-CM | POA: Diagnosis present

## 2023-01-03 DIAGNOSIS — Z882 Allergy status to sulfonamides status: Secondary | ICD-10-CM | POA: Diagnosis not present

## 2023-01-03 DIAGNOSIS — Z602 Problems related to living alone: Secondary | ICD-10-CM | POA: Diagnosis present

## 2023-01-03 DIAGNOSIS — I480 Paroxysmal atrial fibrillation: Secondary | ICD-10-CM | POA: Diagnosis present

## 2023-01-03 DIAGNOSIS — N200 Calculus of kidney: Secondary | ICD-10-CM | POA: Diagnosis not present

## 2023-01-03 DIAGNOSIS — M6282 Rhabdomyolysis: Secondary | ICD-10-CM | POA: Diagnosis present

## 2023-01-03 DIAGNOSIS — E119 Type 2 diabetes mellitus without complications: Secondary | ICD-10-CM | POA: Diagnosis present

## 2023-01-03 DIAGNOSIS — T796XXA Traumatic ischemia of muscle, initial encounter: Secondary | ICD-10-CM | POA: Diagnosis present

## 2023-01-03 DIAGNOSIS — Z7982 Long term (current) use of aspirin: Secondary | ICD-10-CM | POA: Diagnosis not present

## 2023-01-03 DIAGNOSIS — Y92009 Unspecified place in unspecified non-institutional (private) residence as the place of occurrence of the external cause: Secondary | ICD-10-CM | POA: Diagnosis not present

## 2023-01-03 DIAGNOSIS — Z7984 Long term (current) use of oral hypoglycemic drugs: Secondary | ICD-10-CM | POA: Diagnosis not present

## 2023-01-03 DIAGNOSIS — M8580 Other specified disorders of bone density and structure, unspecified site: Secondary | ICD-10-CM | POA: Diagnosis present

## 2023-01-03 DIAGNOSIS — E039 Hypothyroidism, unspecified: Secondary | ICD-10-CM | POA: Diagnosis present

## 2023-01-03 DIAGNOSIS — E785 Hyperlipidemia, unspecified: Secondary | ICD-10-CM | POA: Diagnosis present

## 2023-01-03 DIAGNOSIS — K8020 Calculus of gallbladder without cholecystitis without obstruction: Secondary | ICD-10-CM | POA: Diagnosis not present

## 2023-01-03 DIAGNOSIS — F32A Depression, unspecified: Secondary | ICD-10-CM | POA: Diagnosis present

## 2023-01-03 DIAGNOSIS — Z8249 Family history of ischemic heart disease and other diseases of the circulatory system: Secondary | ICD-10-CM | POA: Diagnosis not present

## 2023-01-03 DIAGNOSIS — R531 Weakness: Secondary | ICD-10-CM | POA: Diagnosis present

## 2023-01-03 DIAGNOSIS — R197 Diarrhea, unspecified: Secondary | ICD-10-CM | POA: Diagnosis present

## 2023-01-03 DIAGNOSIS — Z833 Family history of diabetes mellitus: Secondary | ICD-10-CM | POA: Diagnosis not present

## 2023-01-03 DIAGNOSIS — N202 Calculus of kidney with calculus of ureter: Secondary | ICD-10-CM | POA: Diagnosis present

## 2023-01-03 DIAGNOSIS — Z79899 Other long term (current) drug therapy: Secondary | ICD-10-CM | POA: Diagnosis not present

## 2023-01-03 DIAGNOSIS — R935 Abnormal findings on diagnostic imaging of other abdominal regions, including retroperitoneum: Secondary | ICD-10-CM | POA: Diagnosis not present

## 2023-01-03 DIAGNOSIS — N179 Acute kidney failure, unspecified: Secondary | ICD-10-CM | POA: Diagnosis present

## 2023-01-03 LAB — GLUCOSE, CAPILLARY
Glucose-Capillary: 158 mg/dL — ABNORMAL HIGH (ref 70–99)
Glucose-Capillary: 153 mg/dL — ABNORMAL HIGH (ref 70–99)
Glucose-Capillary: 152 mg/dL — ABNORMAL HIGH (ref 70–99)
Glucose-Capillary: 142 mg/dL — ABNORMAL HIGH (ref 70–99)
Glucose-Capillary: 125 mg/dL — ABNORMAL HIGH (ref 70–99)

## 2023-01-03 LAB — BASIC METABOLIC PANEL
Anion gap: 14 (ref 5–15)
BUN: 32 mg/dL — ABNORMAL HIGH (ref 8–23)
CO2: 24 mmol/L (ref 22–32)
Calcium: 8.2 mg/dL — ABNORMAL LOW (ref 8.9–10.3)
Chloride: 101 mmol/L (ref 98–111)
Creatinine, Ser: 0.66 mg/dL (ref 0.44–1.00)
GFR, Estimated: >60 mL/min (ref >60)
Glucose, Bld: 143 mg/dL — ABNORMAL HIGH (ref 70–99)
Potassium: 3.6 mmol/L (ref 3.5–5.1)
Sodium: 139 mmol/L (ref 135–145)

## 2023-01-03 LAB — HEMOGLOBIN A1C
Hgb A1c MFr Bld: 7 % — ABNORMAL HIGH (ref 4.8–5.6)
Mean Plasma Glucose: 154.2 mg/dL

## 2023-01-03 LAB — CK: Total CK: 1742 U/L — ABNORMAL HIGH (ref 38–234)

## 2023-01-03 MED ORDER — BISACODYL 5 MG PO TBEC: 5.0000 mg | DELAYED_RELEASE_TABLET | Freq: Every day | ORAL | Status: DC | PRN

## 2023-01-03 MED ORDER — LEVOTHYROXINE SODIUM 88 MCG PO TABS
88.0000 ug | ORAL_TABLET | Freq: Every day | ORAL | Status: DC
  Administered 2023-01-03 – 2023-01-09 (×7): 88 ug via ORAL
  Filled 2023-01-03 (×7): qty 1

## 2023-01-03 MED ORDER — SODIUM CHLORIDE 0.9% FLUSH
3.0000 mL | Freq: Two times a day (BID) | INTRAVENOUS | Status: DC
  Administered 2023-01-03 – 2023-01-06 (×6): 3 mL via INTRAVENOUS

## 2023-01-03 MED ORDER — ONDANSETRON HCL 4 MG/2ML IJ SOLN: 4.0000 mg | Freq: Four times a day (QID) | INTRAMUSCULAR | Status: DC | PRN

## 2023-01-03 MED ORDER — MORPHINE SULFATE (PF) 2 MG/ML IV SOLN: 1.0000 mg | Freq: Four times a day (QID) | INTRAVENOUS | Status: DC | PRN

## 2023-01-03 MED ORDER — IPRATROPIUM BROMIDE 0.02 % IN SOLN: 0.5000 mg | Freq: Four times a day (QID) | RESPIRATORY_TRACT | Status: DC | PRN

## 2023-01-03 MED ORDER — ALBUTEROL SULFATE (2.5 MG/3ML) 0.083% IN NEBU: 2.5000 mg | INHALATION_SOLUTION | Freq: Four times a day (QID) | RESPIRATORY_TRACT | Status: DC | PRN

## 2023-01-03 MED ORDER — HYDROCODONE-ACETAMINOPHEN 5-325 MG PO TABS: 1.0000 | ORAL_TABLET | ORAL | Status: DC | PRN

## 2023-01-03 MED ORDER — ESCITALOPRAM OXALATE 10 MG PO TABS
20.0000 mg | ORAL_TABLET | Freq: Every day | ORAL | Status: DC
  Administered 2023-01-03 – 2023-01-09 (×7): 20 mg via ORAL
  Filled 2023-01-03 (×7): qty 2

## 2023-01-03 MED ORDER — INSULIN ASPART 100 UNIT/ML IJ SOLN: 0.0000 [IU] | Freq: Every day | INTRAMUSCULAR | Status: DC

## 2023-01-03 MED ORDER — ONDANSETRON HCL 4 MG PO TABS: 4.0000 mg | ORAL_TABLET | Freq: Four times a day (QID) | ORAL | Status: DC | PRN

## 2023-01-03 MED ORDER — ACETAMINOPHEN 650 MG RE SUPP: 650.0000 mg | Freq: Four times a day (QID) | RECTAL | Status: DC | PRN

## 2023-01-03 MED ORDER — TRAZODONE HCL 50 MG PO TABS: 25.0000 mg | ORAL_TABLET | Freq: Every evening | ORAL | Status: DC | PRN

## 2023-01-03 MED ORDER — PROPRANOLOL HCL 20 MG PO TABS
20.0000 mg | ORAL_TABLET | Freq: Four times a day (QID) | ORAL | Status: DC
  Administered 2023-01-03 – 2023-01-09 (×21): 20 mg via ORAL
  Filled 2023-01-03 (×26): qty 1

## 2023-01-03 MED ORDER — ASPIRIN 81 MG PO TBEC
81.0000 mg | DELAYED_RELEASE_TABLET | Freq: Every day | ORAL | Status: DC
  Administered 2023-01-03 – 2023-01-09 (×7): 81 mg via ORAL
  Filled 2023-01-03 (×7): qty 1

## 2023-01-03 MED ORDER — SODIUM CHLORIDE 0.9 % IV SOLN: INTRAVENOUS | Status: DC

## 2023-01-03 MED ORDER — ENOXAPARIN SODIUM 40 MG/0.4ML IJ SOSY
40.0000 mg | PREFILLED_SYRINGE | INTRAMUSCULAR | Status: DC
  Administered 2023-01-03 – 2023-01-09 (×7): 40 mg via SUBCUTANEOUS
  Filled 2023-01-03 (×7): qty 0.4

## 2023-01-03 MED ORDER — ROSUVASTATIN CALCIUM 5 MG PO TABS
5.0000 mg | ORAL_TABLET | Freq: Every day | ORAL | Status: DC
  Administered 2023-01-03: 5 mg via ORAL
  Filled 2023-01-03: qty 1

## 2023-01-03 MED ORDER — INSULIN ASPART 100 UNIT/ML IJ SOLN
0.0000 [IU] | Freq: Three times a day (TID) | INTRAMUSCULAR | Status: DC
  Administered 2023-01-03 (×2): 2 [IU] via SUBCUTANEOUS
  Administered 2023-01-03 – 2023-01-04 (×2): 1 [IU] via SUBCUTANEOUS
  Administered 2023-01-05 (×2): 2 [IU] via SUBCUTANEOUS
  Administered 2023-01-06: 1 [IU] via SUBCUTANEOUS
  Administered 2023-01-06: 2 [IU] via SUBCUTANEOUS
  Administered 2023-01-06 – 2023-01-07 (×2): 1 [IU] via SUBCUTANEOUS
  Administered 2023-01-07: 3 [IU] via SUBCUTANEOUS
  Administered 2023-01-07: 1 [IU] via SUBCUTANEOUS
  Administered 2023-01-08 – 2023-01-09 (×3): 2 [IU] via SUBCUTANEOUS

## 2023-01-03 MED ORDER — RISPERIDONE 2 MG PO TABS
2.0000 mg | ORAL_TABLET | Freq: Every day | ORAL | Status: DC
  Administered 2023-01-03 – 2023-01-08 (×6): 2 mg via ORAL
  Filled 2023-01-03 (×6): qty 1

## 2023-01-03 MED ORDER — SENNOSIDES-DOCUSATE SODIUM 8.6-50 MG PO TABS: 1.0000 | ORAL_TABLET | Freq: Every evening | ORAL | Status: DC | PRN

## 2023-01-03 MED ORDER — DIGOXIN 125 MCG PO TABS
0.2500 mg | ORAL_TABLET | ORAL | Status: DC
  Administered 2023-01-03 – 2023-01-08 (×3): 0.25 mg via ORAL
  Filled 2023-01-03 (×4): qty 2

## 2023-01-03 MED ORDER — HYDRALAZINE HCL 20 MG/ML IJ SOLN: 5.0000 mg | Freq: Three times a day (TID) | INTRAMUSCULAR | Status: DC | PRN

## 2023-01-03 MED ORDER — ACETAMINOPHEN 325 MG PO TABS
650.0000 mg | ORAL_TABLET | Freq: Four times a day (QID) | ORAL | Status: DC | PRN
  Administered 2023-01-05: 650 mg via ORAL
  Filled 2023-01-03: qty 2

## 2023-01-03 NOTE — Plan of Care (Signed)

## 2023-01-03 NOTE — Progress Notes (Signed)
Physical Therapy Short Note  Eval complete with full note to follow;  Pt able to walk with RW to/from bathroom with min assist; unsteady gait; Given she lives alone, I favor short-term SNF for post-acute rehab to maximize independence and safety with mobility and ADLs in prep for return home;   Van Clines, PT  Acute Rehabilitation Services Office [phone] Secure Chat welcomed

## 2023-01-03 NOTE — Progress Notes (Signed)
Plan of Care Note for accepted transfer   Patient: Deanna Shea MRN: [id number]   DOA: 01/02/2023  Facility requesting transfer: [facility]   Requesting Provider: Dr. Judd Lien   Reason for transfer: Rhabdomyolysis   Facility course: 77 year old female with history of T2DM, PAF not anticoagulated, hypothyroidism, and schizophrenia who lives alone, cannot be reached by phone, and was found on the floor of her residence, unable to get up, and complaining of hip and back pain.  Patient believes she was on the floor for roughly 2 days.  ED workup is notable for glucose 205, BUN 46, creatinine 0.98, serum CK 2675, and no acute findings on CT of the head, hip, or lumbar spine.   She was given a liter of normal saline in the ED.  Plan of care: The patient is accepted for admission to Telemetry unit, at [facility].   Author: Briscoe Deutscher, MD 01/03/2023  Check [email or web address] for on-call coverage.  Nursing staff, Please call [facility] Admits & Consults System-Wide number on Amion as soon as patient's arrival, so appropriate admitting provider can evaluate the pt.

## 2023-01-03 NOTE — Plan of Care (Signed)
  Problem: Safety: Goal: Ability to remain free from injury will improve Outcome: Progressing   

## 2023-01-03 NOTE — Progress Notes (Signed)
[facility] PROGRESS NOTE   Deanna Shea VZD:[id number] DOB: 1945-07-26 DOA: 01/02/2023  PCP: Darrow Bussing, MD  Brief History/Interval Summary: 77 y.o. female with a known history of anxiety, depression, atrial fibrillation on aspirin, diabetes, mitral valve prolapse, schizophrenia presented to the emergency department for evaluation of fall.  Patient was in a usual state of health until 3 days prior to this admission when she fell in her home, states that it was a mechanical fall but she has been unable to get up due to weakness and pain.  Her daughter called for help when she could not get in touch with patient.  She was brought into the emergency department.  Was found to have rhabdomyolysis and was hospitalized.  Consultants: None  Procedures: None    Subjective/Interval History: Patient denies any pain at this time.  Specifically denies abdominal pain dysuria or blood in the urine.  No nausea or vomiting.  Seems to be somewhat distracted at times.    Assessment/Plan:  Rhabdomyolysis Secondary to mechanical fall.  CK level was 2675.  Patient was started on IV fluids.  Will recheck labs tomorrow.  Reason for her fall is not entirely clear.  She denies any syncopal episode.  She mentions that she stumbled on something and fell but could not get up.  No injuries identified on imaging studies or examination.  Will involve PT and OT. Continue with IV fluids for now. Monitor urine output.  History of atrial fibrillation Noted to be on aspirin digoxin and propranolol.  Will check digoxin level.  Nephrolithiasis/ureterolithiasis Incidentally noted to have kidney stones and also a stone in the mid left ureter.  No hydronephrosis noted.  Patient currently asymptomatic.  Will check UA.  May need to repeat a CT scan in the next 24 to 48 hours.  Hyperlipidemia Hold statin for now.  History of anxiety depression and schizophrenia Continue Lexapro and Risperdal.  History of  hypothyroidism Continue levothyroxine.  History of diabetes mellitus type 2 Holding metformin.  Check CBGs.  SSI.  Incidental cholelithiasis Asymptomatic.  DVT Prophylaxis: Lovenox Code Status: Full code Family Communication: Discussed with patient.  No family at bedside Disposition Plan: To be determined  Status is: Inpatient Remains inpatient appropriate because: Rhabdomyolysis, ambulatory dysfunction      Medications: Scheduled:  aspirin EC  81 mg Oral Daily   digoxin  0.25 mg Oral Once per day on Monday Wednesday Friday   enoxaparin (LOVENOX) injection  40 mg Subcutaneous Q24H   escitalopram  20 mg Oral Daily   insulin aspart  0-5 Units Subcutaneous QHS   insulin aspart  0-9 Units Subcutaneous TID WC   levothyroxine  88 mcg Oral QAC breakfast   propranolol  20 mg Oral QID   risperiDONE  2 mg Oral QHS   sodium chloride flush  3 mL Intravenous Q12H   Continuous:  sodium chloride 75 mL/hr at 01/03/23 0831   IRJ:JOACZYSAYTKZS **OR** acetaminophen, albuterol, bisacodyl, hydrALAZINE, HYDROcodone-acetaminophen, ipratropium, morphine injection, ondansetron **OR** ondansetron (ZOFRAN) IV, senna-docusate, traZODone  Antibiotics: Anti-infectives (From admission, onward)    None       Objective:  Vital Signs  Vitals:   01/03/23 0252 01/03/23 0414 01/03/23 0513 01/03/23 0751  BP:  (!) 143/56  132/62  Pulse:  76  74  Resp:  16  18  Temp:  98 F (36.7 C)  97.7 F (36.5 C)  TempSrc:      SpO2:  97%  100%  Weight: 73.9 kg  73.9 kg  Height:        Intake/Output Summary (Last 24 hours) at 01/03/2023 0941 Last data filed at 01/03/2023 0223 Gross per 24 hour  Intake 1242.05 ml  Output --  Net 1242.05 ml   Filed Weights   01/02/23 2207 01/03/23 0252 01/03/23 0513  Weight: 73 kg 73.9 kg 73.9 kg    General appearance: Awake alert.  In no distress Resp: Clear to auscultation bilaterally.  Normal effort Cardio: S1-S2 is normal regular.  No S3-S4.  No rubs  murmurs or bruit GI: Abdomen is soft.  Nontender nondistended.  Bowel sounds are present normal.  No masses organomegaly Extremities: No edema.  Full range of motion of lower extremities. Neurologic: Alert and oriented x3.  No focal neurological deficits.    Lab Results:  Data Reviewed: I have personally reviewed following labs and reports of the imaging studies  CBC: Recent Labs  Lab 01/02/23 2218  WBC 10.7*  NEUTROABS 9.1*  HGB 15.6*  HCT 47.6*  MCV 91.2  PLT 326    Basic Metabolic Panel: Recent Labs  Lab 01/02/23 2218  NA 144  K 3.6  CL 102  CO2 27  GLUCOSE 205*  BUN 46*  CREATININE 0.98  CALCIUM 9.9  MG 2.3    GFR: Estimated Creatinine Clearance: 48.2 mL/min (by C-G formula based on SCr of 0.98 mg/dL).  Cardiac Enzymes: Recent Labs  Lab 01/02/23 2218  CKTOTAL 2,675*    CBG: Recent Labs  Lab 01/03/23 0210 01/03/23 0748  GLUCAP 158* 153*    Radiology Studies: CT Lumbar Spine Wo Contrast  Result Date: 01/03/2023 CLINICAL DATA:  Found down EXAM: CT LUMBAR SPINE WITHOUT CONTRAST TECHNIQUE: Multidetector CT imaging of the lumbar spine was performed without intravenous contrast administration. Multiplanar CT image reconstructions were also generated. RADIATION DOSE REDUCTION: This exam was performed according to the departmental dose-optimization program which includes automated exposure control, adjustment of the mA and/or kV according to patient size and/or use of iterative reconstruction technique. COMPARISON:  None Available. FINDINGS: Segmentation: 5 lumbar type vertebrae. Alignment: Normal. Vertebrae: No acute fracture or focal pathologic process. Paraspinal and other soft tissues: Cholelithiasis. There are multiple bilateral renal calculi. There is a 3 mm calculus in the mid left ureter. No hydronephrosis. Disc levels: No spinal canal stenosis. Degenerative disc disease is greatest at L5-S1. Mild right L5 foraminal stenosis. IMPRESSION: 1. No acute  fracture or traumatic malalignment of the lumbar spine. 2. A 3 mm calculus in the mid left ureter. No hydronephrosis. 3. Cholelithiasis and nephrolithiasis. Electronically Signed   By: Deatra Robinson M.D.   On: 01/03/2023 00:21   CT Hip Left Wo Contrast  Result Date: 01/03/2023 CLINICAL DATA:  Trauma to the left hip. Pain. Concern for fracture. EXAM: CT OF THE LEFT HIP WITHOUT CONTRAST TECHNIQUE: Multidetector CT imaging of the left hip was performed according to the standard protocol. Multiplanar CT image reconstructions were also generated. RADIATION DOSE REDUCTION: This exam was performed according to the departmental dose-optimization program which includes automated exposure control, adjustment of the mA and/or kV according to patient size and/or use of iterative reconstruction technique. COMPARISON:  CT abdomen pelvis dated 01/27/2014. FINDINGS: Bones/Joint/Cartilage No acute fracture or dislocation. Mild degenerative changes of the left hip. No joint effusion. Ligaments Suboptimally assessed by CT. Muscles and Tendons No acute findings.  No intramuscular fluid collection or hematoma. Soft tissues No fluid collection or hematoma. Mild stranding of the subcutaneous fat of the lateral left hip. A string of adjacent stones in the  left hemipelvis measuring approximately 2.5 cm in length may be vascular calcification but appear to be in the distal left ureter. Additional small stones noted along the left posterior bladder wall. CT of the abdomen pelvis may provide better evaluation. IMPRESSION: 1. No acute fracture or dislocation. 2. Probable string of stones in the distal left ureter as well as stone in the urinary bladder. 3. CT of the abdomen pelvis may provide better evaluation. Electronically Signed   By: Elgie Collard M.D.   On: 01/03/2023 00:21   CT Head Wo Contrast  Result Date: 01/02/2023 CLINICAL DATA:  Found down on floor, initial encounter EXAM: CT HEAD WITHOUT CONTRAST TECHNIQUE: Contiguous  axial images were obtained from the base of the skull through the vertex without intravenous contrast. RADIATION DOSE REDUCTION: This exam was performed according to the departmental dose-optimization program which includes automated exposure control, adjustment of the mA and/or kV according to patient size and/or use of iterative reconstruction technique. COMPARISON:  None Available. FINDINGS: Brain: No evidence of acute infarction, hemorrhage, hydrocephalus, extra-axial collection or mass lesion/mass effect. Mild atrophic changes and chronic white matter ischemic changes noted. Vascular: No hyperdense vessel or unexpected calcification. Skull: Normal. Negative for fracture or focal lesion. Sinuses/Orbits: No acute finding. Chronic appearing mucosal thickening is noted within the left maxillary antrum. Bilateral antrectomies are seen. Other: None. IMPRESSION: Chronic atrophic and ischemic changes without acute abnormality. Changes of chronic sinusitis. Electronically Signed   By: Alcide Clever M.D.   On: 01/02/2023 23:28       LOS: 0 days   Deanna Shea  [facility] Pager on [email or web address]  01/03/2023, 9:41 AM

## 2023-01-03 NOTE — Progress Notes (Signed)
Pt. Arrived to unit alert and oriented c/o pain the the left hip. MD at beside. Bed in lowest position call bell within reach

## 2023-01-03 NOTE — Evaluation (Signed)
Physical Therapy Evaluation Patient Details Name: Deanna Shea MRN: [id number] DOB: [birth year] Today's Date: 01/03/2023  History of Present Illness  Deanna Shea is a 77 y.o. female  presents to the emergency department for evaluation of fall.  Patient was in a usual state of health until 3 days ago when she fell in her home, states that it was a mechanical fall but she has been unable to get up due to weakness and pain; down for days, and with rhabdomyolysis; with a known history of anxiety, depression, atrial fibrillation on aspirin, diabetes, mitral valve prolapse, schizophrenia  Clinical Impression   Pt admitted with above diagnosis. Lives at home alone, in a single-level home with 4 steps to enter; Prior to admission, pt was able to manage independently; Presents to PT with generalized weakness and soreness, decr balance, high fall risk;  Overall needing min assist for bed mobility and transfers; benefits from using RW  for gait stability; Patient will benefit from continued inpatient follow up therapy, <3 hours/day, to maximize independence and safety with mobility and ADLs in prep for gatting back home; Pt currently with functional limitations due to the deficits listed below (see PT Problem List). Pt will benefit from skilled PT to increase their independence and safety with mobility to allow discharge to the venue listed below.           If plan is discharge home, recommend the following: A lot of help with walking and/or transfers (and 24/7 assist)   Can travel by private vehicle    (soon)    Equipment Recommendations Rolling walker (2 wheels);BSC/3in1  Recommendations for Other Services  OT consult    Functional Status Assessment Patient has had a recent decline in their functional status and demonstrates the ability to make significant improvements in function in a reasonable and predictable amount of time.     Precautions / Restrictions Precautions Precautions:  Fall Restrictions Weight Bearing Restrictions: No      Mobility  Bed Mobility Overal bed mobility: Needs Assistance Bed Mobility: Supine to Sit     Supine to sit: Min assist     General bed mobility comments: min assist to elevate trunk to sit and steady    Transfers Overall transfer level: Needs assistance Equipment used: Rolling walker (2 wheels) Transfers: Sit to/from Stand Sit to Stand: Min assist           General transfer comment: stood from EOB and from commode; dependent on UEs for rise and control of stand to sit    Ambulation/Gait Ambulation/Gait assistance: Contact guard assist Gait Distance (Feet): 40 Feet Assistive device: Rolling walker (2 wheels) Gait Pattern/deviations: Step-through pattern, Decreased step length - right, Decreased step length - left       General Gait Details: Cues for rW use and porximity  Stairs            Wheelchair Mobility     Tilt Bed    Modified Rankin (Stroke Patients Only)       Balance Overall balance assessment: Needs assistance   Sitting balance-Leahy Scale: Fair       Standing balance-Leahy Scale: Poor                               Pertinent Vitals/Pain Pain Assessment Pain Assessment: Faces Faces Pain Scale: Hurts little more Pain Location: low back Pain Descriptors / Indicators: Sore Pain Intervention(s): Monitored during session, Repositioned    Home Living  Family/patient expects to be discharged to:: Private residence Living Arrangements: Alone Available Help at Discharge: Family;Available PRN/intermittently Type of Home: Apartment Home Access: Stairs to enter Entrance Stairs-Rails: Left Entrance Stairs-Number of Steps: 4   Home Layout: One level Home Equipment: None      Prior Function Prior Level of Function : Independent/Modified Independent             Mobility Comments: walks without assistive device       Extremity/Trunk Assessment   Upper  Extremity Assessment Upper Extremity Assessment: Generalized weakness    Lower Extremity Assessment Lower Extremity Assessment: Generalized weakness    Cervical / Trunk Assessment Cervical / Trunk Assessment: Other exceptions Cervical / Trunk Exceptions: soreness  Communication   Communication Communication: No apparent difficulties  Cognition Arousal: Alert Behavior During Therapy: WFL for tasks assessed/performed Overall Cognitive Status: No family/caregiver present to determine baseline cognitive functioning                                 General Comments: Slow to answer questions        General Comments General comments (skin integrity, edema, etc.): NAD on Room Air    Exercises     Assessment/Plan    PT Assessment Patient needs continued PT services  PT Problem List Decreased strength;Decreased range of motion;Decreased activity tolerance;Decreased balance;Decreased mobility;Decreased cognition;Decreased knowledge of use of DME;Decreased safety awareness;Decreased knowledge of precautions       PT Treatment Interventions DME instruction;Gait training;Stair training;Functional mobility training;Therapeutic activities;Therapeutic exercise;Balance training;Neuromuscular re-education;Cognitive remediation;Patient/family education    PT Goals (Current goals can be found in the Care Plan section)  Acute Rehab PT Goals Patient Stated Goal: be able to walk safely PT Goal Formulation: With patient Time For Goal Achievement: 01/17/23 Potential to Achieve Goals: Good    Frequency Min 1X/week     Co-evaluation               AM-PAC PT "6 Clicks" Mobility  Outcome Measure Help needed turning from your back to your side while in a flat bed without using bedrails?: None Help needed moving from lying on your back to sitting on the side of a flat bed without using bedrails?: A Little Help needed moving to and from a bed to a chair (including a  wheelchair)?: A Little Help needed standing up from a chair using your arms (e.g., wheelchair or bedside chair)?: A Little Help needed to walk in hospital room?: A Little Help needed climbing 3-5 steps with a railing? : A Lot 6 Click Score: 18    End of Session Equipment Utilized During Treatment: Gait belt Activity Tolerance: Patient tolerated treatment well Patient left: with call bell/phone within reach;Other (comment) (in bathroom on commode) Nurse Communication: Mobility status;Other (comment) (ended session with pt on commode in bathroom; NT in room with her) PT Visit Diagnosis: Unsteadiness on feet (R26.81);Other abnormalities of gait and mobility (R26.89)    Time: 1610-9604 PT Time Calculation (min) (ACUTE ONLY): 20 min   Charges:   PT Evaluation $PT Eval Moderate Complexity: 1 Mod   PT General Charges $$ ACUTE PT VISIT: 1 Visit         Van Clines, PT  Acute Rehabilitation Services Office [phone] Secure Chat welcomed   Levi Aland 01/03/2023, 5:55 PM

## 2023-01-03 NOTE — H&P (Signed)
History and Physical   [facility] [place] [facility] Admission History and Physical [employer], D.O.    Patient Name: Deanna Shea MR#: [id number] Date of Birth: 09-Jan-1946 Date of Admission: 01/02/2023  Referring MD/NP/PA: Corliss Skains Primary Care Physician: Darrow Bussing, MD  Chief Complaint:  Chief Complaint  Patient presents with   Fall    HPI: Deanna Shea is a 77 y.o. female with a known history of anxiety, depression, atrial fibrillation on aspirin, diabetes, mitral valve prolapse, schizophrenia presents to the emergency department for evaluation of fall.  Patient was in a usual state of health until 3 days ago when she fell in her home, states that it was a mechanical fall but she has been unable to get up due to weakness and pain.  Her daughter called for help when she could not get in touch with patient.  Patient states that she has not been able to eat or drink normally.  Her only complaint at this time is mid to low back pain  Patient denies fevers/chills, weakness, dizziness, chest pain, shortness of breath, N/V/C/D, abdominal pain, dysuria/frequency, changes in mental status.    Otherwise there has been no change in status. Patient has been taking medication as prescribed and there has been no recent change in medication or diet.  No recent antibiotics.  There has been no recent illness, hospitalizations, travel or sick contacts.    EMS/ED Course: Patient received normal saline bolus. Medical admission has been requested for further management of rhabdomyolysis.  Review of Systems:  CONSTITUTIONAL: No fever/chills, fatigue, weakness, weight gain/loss, headache. EYES: No blurry or double vision. ENT: No tinnitus, postnasal drip, redness or soreness of the oropharynx. RESPIRATORY: No cough, dyspnea, wheeze.  No hemoptysis.  CARDIOVASCULAR: No chest pain, palpitations, syncope, orthopnea. No lower extremity edema.  GASTROINTESTINAL: No nausea, vomiting,  abdominal pain, diarrhea, constipation.  No hematemesis, melena or hematochezia. GENITOURINARY: No dysuria, frequency, hematuria. ENDOCRINE: No polyuria or nocturia. No heat or cold intolerance. HEMATOLOGY: No anemia, bruising, bleeding. INTEGUMENTARY: No rashes, ulcers, lesions. MUSCULOSKELETAL: To low back pain no arthritis, gout. NEUROLOGIC: No numbness, tingling, ataxia, seizure-type activity, weakness. PSYCHIATRIC: No anxiety, depression, insomnia.   Past Medical History:  Diagnosis Date   Allergic rhinitis    Anxiety    Atrial fibrillation (HCC) 10/28/2013   diagnoses in the 20's, well controlled with Digoxin and Inderal    Depression    Diabetes mellitus without complication (HCC)    Gall stones    Heart murmur    Kidney disease, medullary sponge    Kidney stones    Mitral valve disorders(424.0) 10/28/2013   Mitral valve prolapse    congenital    MRSA (methicillin resistant Staphylococcus aureus)    9/10   Osteopenia    dexa 4/09, improved but not resolved 2011 DEXA   Palpitations 10/28/2013   Schizophrenia (HCC)    Shingles    2000    Past Surgical History:  Procedure Laterality Date   APPENDECTOMY  01/29/2014   CYSTOSCOPY W/ STONE MANIPULATION  1990's   ELBOW ARTHROSCOPY Left 2007   ELBOW ARTHROSCOPY Right 2008   "cyst removed; tricep"   ELBOW ARTHROSCOPY WITH TENDON RECONSTRUCTION Right 2005; 2006   EXPLORATORY LAPAROTOMY  01/29/2014   KNEE ARTHROSCOPY Right 2004   "bone chip removed"   KNEE ARTHROSCOPY Right 02/2009   "cleanup loose cartilage"   LAPAROTOMY N/A 01/29/2014   Procedure: EXPLORATORY LAPAROTOMY, SMALL BOWEL RESECTION, APPENDECTOMY;  Surgeon: Manus Rudd, MD;  Location: [facility] OR;  Service: General;  Laterality: N/A;   NASAL SINUS SURGERY  X 2   "for polyps"   SHOULDER ARTHROSCOPY Right 1990's X 2   SHOULDER ARTHROSCOPY W/ ROTATOR CUFF REPAIR Right    SMALL INTESTINE SURGERY  01/29/2014     reports that she has never smoked. She has never used  smokeless tobacco. She reports that she does not drink alcohol and does not use drugs.  Allergies  Allergen Reactions   Sulfa Antibiotics Other (See Comments)    Sores in mouth    Family History  Problem Relation Age of Onset   Hypertension Mother    Diabetes Mellitus II Mother    Arthritis Father    Cataracts Father    Dementia Father    Heart attack Neg Hx     Prior to Admission medications   Medication Sig Start Date End Date Taking? Authorizing Provider  aspirin EC 81 MG tablet Take 81 mg by mouth daily.    [provider]  digoxin (LANOXIN) 0.25 MG tablet Take one tablet by mouth on Mon, Wed and Friday 11/07/22   Corky Crafts, MD  escitalopram (LEXAPRO) 20 MG tablet Take 20 mg by mouth daily.  10/08/15   [provider]  Levothyroxine Sodium 88 MCG CAPS Take 88 mcg by mouth daily before breakfast. 11/27/19   [provider]  metFORMIN (GLUCOPHAGE) 500 MG tablet Take 1 tablet by mouth 2 (two) times daily. 05/15/17   [provider]  OVER THE COUNTER MEDICATION Take 1 tablet by mouth at bedtime. Estrogen supplement    [provider]  propranolol (INDERAL) 20 MG tablet Take 1 tablet (20 mg total) by mouth 4 (four) times daily. 09/08/22   Corky Crafts, MD  risperiDONE (RISPERDAL) 2 MG tablet Take 2 mg by mouth at bedtime.    [provider]  rosuvastatin (CRESTOR) 5 MG tablet Take 5 mg by mouth daily.  11/30/19   [provider]    Physical Exam: Vitals:   01/03/23 0115 01/03/23 0130 01/03/23 0221 01/03/23 0252  BP: (!) 132/58 (!) 126/57 (!) 143/68   Pulse: 76 81 91   Resp: 18 19 18    Temp:   98.2 F (36.8 C)   TempSrc:   Oral   SpO2: 98% 95% 99%   Weight:    73.9 kg  Height:        GENERAL: 77 y.o.-year-old female patient, well-developed, well-nourished lying in the bed in no acute distress.  Pleasant and cooperative.   HEENT: Head atraumatic, normocephalic. Pupils equal. Mucus membranes  moist. NECK: Supple. No JVD. CHEST: Normal breath sounds bilaterally. No wheezing, rales, rhonchi or crackles. No use of accessory muscles of respiration.  No reproducible chest wall tenderness.  CARDIOVASCULAR: S1, S2 normal. No murmurs, rubs, or gallops. Cap refill <2 seconds. Pulses intact distally.  ABDOMEN: Soft, nondistended, nontender. No rebound, guarding, rigidity. Normoactive bowel sounds present in all four quadrants.  EXTREMITIES: No pedal edema, cyanosis, or clubbing. No calf tenderness or Homan's sign.  NEUROLOGIC: The patient is alert and oriented x 3. Cranial nerves II through XII are grossly intact with no focal sensorimotor deficit. PSYCHIATRIC:  Normal affect, mood, thought content. SKIN: Warm, dry, and intact without obvious rash, lesion, or ulcer.    Labs on Admission:  CBC: Recent Labs  Lab 01/02/23 2218  WBC 10.7*  NEUTROABS 9.1*  HGB 15.6*  HCT 47.6*  MCV 91.2  PLT 326   Basic Metabolic Panel: Recent Labs  Lab  01/02/23 2218  NA 144  K 3.6  CL 102  CO2 27  GLUCOSE 205*  BUN 46*  CREATININE 0.98  CALCIUM 9.9  MG 2.3   GFR: Estimated Creatinine Clearance: 48.2 mL/min (by C-G formula based on SCr of 0.98 mg/dL). Liver Function Tests: No results for input(s): "AST", "ALT", "ALKPHOS", "BILITOT", "PROT", "ALBUMIN" in the last 168 hours. No results for input(s): "LIPASE", "AMYLASE" in the last 168 hours. No results for input(s): "AMMONIA" in the last 168 hours. Coagulation Profile: No results for input(s): "INR", "PROTIME" in the last 168 hours. Cardiac Enzymes: Recent Labs  Lab 01/02/23 2218  CKTOTAL 2,675*   BNP (last 3 results) No results for input(s): "PROBNP" in the last 8760 hours. HbA1C: No results for input(s): "HGBA1C" in the last 72 hours. CBG: No results for input(s): "GLUCAP" in the last 168 hours. Lipid Profile: No results for input(s): "CHOL", "HDL", "LDLCALC", "TRIG", "CHOLHDL", "LDLDIRECT" in the last 72 hours. Thyroid  Function Tests: No results for input(s): "TSH", "T4TOTAL", "FREET4", "T3FREE", "THYROIDAB" in the last 72 hours. Anemia Panel: No results for input(s): "VITAMINB12", "FOLATE", "FERRITIN", "TIBC", "IRON", "RETICCTPCT" in the last 72 hours. Urine analysis:    Component Value Date/Time   COLORURINE YELLOW 01/27/2014 1050   APPEARANCEUR CLEAR 01/27/2014 1050   LABSPEC 1.006 01/27/2014 1050   PHURINE 6.0 01/27/2014 1050   GLUCOSEU NEGATIVE 01/27/2014 1050   HGBUR NEGATIVE 01/27/2014 1050   BILIRUBINUR NEGATIVE 01/27/2014 1050   KETONESUR 15 (A) 01/27/2014 1050   PROTEINUR NEGATIVE 01/27/2014 1050   UROBILINOGEN 0.2 01/27/2014 1050   NITRITE NEGATIVE 01/27/2014 1050   LEUKOCYTESUR NEGATIVE 01/27/2014 1050   Sepsis Labs: @LABRCNTIP (procalcitonin:4,lacticidven:4) )No results found for this or any previous visit (from the past 240 hour(s)).   Radiological Exams on Admission: CT Lumbar Spine Wo Contrast  Result Date: 01/03/2023 CLINICAL DATA:  Found down EXAM: CT LUMBAR SPINE WITHOUT CONTRAST TECHNIQUE: Multidetector CT imaging of the lumbar spine was performed without intravenous contrast administration. Multiplanar CT image reconstructions were also generated. RADIATION DOSE REDUCTION: This exam was performed according to the departmental dose-optimization program which includes automated exposure control, adjustment of the mA and/or kV according to patient size and/or use of iterative reconstruction technique. COMPARISON:  None Available. FINDINGS: Segmentation: 5 lumbar type vertebrae. Alignment: Normal. Vertebrae: No acute fracture or focal pathologic process. Paraspinal and other soft tissues: Cholelithiasis. There are multiple bilateral renal calculi. There is a 3 mm calculus in the mid left ureter. No hydronephrosis. Disc levels: No spinal canal stenosis. Degenerative disc disease is greatest at L5-S1. Mild right L5 foraminal stenosis. IMPRESSION: 1. No acute fracture or traumatic  malalignment of the lumbar spine. 2. A 3 mm calculus in the mid left ureter. No hydronephrosis. 3. Cholelithiasis and nephrolithiasis. Electronically Signed   By: Deatra Robinson M.D.   On: 01/03/2023 00:21   CT Hip Left Wo Contrast  Result Date: 01/03/2023 CLINICAL DATA:  Trauma to the left hip. Pain. Concern for fracture. EXAM: CT OF THE LEFT HIP WITHOUT CONTRAST TECHNIQUE: Multidetector CT imaging of the left hip was performed according to the standard protocol. Multiplanar CT image reconstructions were also generated. RADIATION DOSE REDUCTION: This exam was performed according to the departmental dose-optimization program which includes automated exposure control, adjustment of the mA and/or kV according to patient size and/or use of iterative reconstruction technique. COMPARISON:  CT abdomen pelvis dated 01/27/2014. FINDINGS: Bones/Joint/Cartilage No acute fracture or dislocation. Mild degenerative changes of the left hip. No joint effusion. Ligaments Suboptimally assessed  by CT. Muscles and Tendons No acute findings.  No intramuscular fluid collection or hematoma. Soft tissues No fluid collection or hematoma. Mild stranding of the subcutaneous fat of the lateral left hip. A string of adjacent stones in the left hemipelvis measuring approximately 2.5 cm in length may be vascular calcification but appear to be in the distal left ureter. Additional small stones noted along the left posterior bladder wall. CT of the abdomen pelvis may provide better evaluation. IMPRESSION: 1. No acute fracture or dislocation. 2. Probable string of stones in the distal left ureter as well as stone in the urinary bladder. 3. CT of the abdomen pelvis may provide better evaluation. Electronically Signed   By: Elgie Collard M.D.   On: 01/03/2023 00:21   CT Head Wo Contrast  Result Date: 01/02/2023 CLINICAL DATA:  Found down on floor, initial encounter EXAM: CT HEAD WITHOUT CONTRAST TECHNIQUE: Contiguous axial images were  obtained from the base of the skull through the vertex without intravenous contrast. RADIATION DOSE REDUCTION: This exam was performed according to the departmental dose-optimization program which includes automated exposure control, adjustment of the mA and/or kV according to patient size and/or use of iterative reconstruction technique. COMPARISON:  None Available. FINDINGS: Brain: No evidence of acute infarction, hemorrhage, hydrocephalus, extra-axial collection or mass lesion/mass effect. Mild atrophic changes and chronic white matter ischemic changes noted. Vascular: No hyperdense vessel or unexpected calcification. Skull: Normal. Negative for fracture or focal lesion. Sinuses/Orbits: No acute finding. Chronic appearing mucosal thickening is noted within the left maxillary antrum. Bilateral antrectomies are seen. Other: None. IMPRESSION: Chronic atrophic and ischemic changes without acute abnormality. Changes of chronic sinusitis. Electronically Signed   By: Alcide Clever M.D.   On: 01/02/2023 23:28    EKG: Sinus tach at 130 with normal axis and lateral ST depressions  Assessment/Plan  This is a 77 y.o. female with a history of anxiety, depression, atrial fibrillation on aspirin, diabetes, mitral valve prolapse, schizophrenia now being admitted with:  #.  Rhabdomyolysis s/p fall at home - Admit inpatient - IV fluid hydration - Pain control -PT evaluation and social work for consideration of rehab placement  #.  History of atrial fibrillation with abnormal EKG - Continue aspirin, digoxin, propranolol - Monitor on tele  #.  History of hyperlipidemia - Continue Crestor  #. History of anxiety and depression, schizophrenia - Continue Lexapro and Risperdal  #. History of hypothyroidism - Continue levothyroxine  #. History of diabetes -Accu-Cheks ACHS with regular insulin sliding scale coverage - Hold metformin  Admission status: Inpatient tele IV Fluids: NS Diet/Nutrition: Heart  healthy carb controlled  Consults called: None  DVT Px: Lovenox, SCDs and early ambulation. Code Status: Full Code  Disposition Plan: To be determined  All the records are reviewed and case discussed with ED provider. Management plans discussed with the patient and/or family who express understanding and agree with plan of care.  [employer] D.O. on 01/03/2023 at 3:48 AM CC: Primary care physician; Darrow Bussing, MD   01/03/2023, 3:48 AM

## 2023-01-04 ENCOUNTER — Encounter (HOSPITAL_COMMUNITY): Payer: Self-pay | Admitting: Family Medicine

## 2023-01-04 DIAGNOSIS — N200 Calculus of kidney: Secondary | ICD-10-CM | POA: Diagnosis not present

## 2023-01-04 DIAGNOSIS — T796XXA Traumatic ischemia of muscle, initial encounter: Secondary | ICD-10-CM | POA: Diagnosis not present

## 2023-01-04 LAB — COMPREHENSIVE METABOLIC PANEL
ALT: 37 U/L (ref 0–44)
AST: 45 U/L — ABNORMAL HIGH (ref 15–41)
Albumin: 2.5 g/dL — ABNORMAL LOW (ref 3.5–5.0)
Alkaline Phosphatase: 55 U/L (ref 38–126)
Anion gap: 13 (ref 5–15)
BUN: 22 mg/dL (ref 8–23)
CO2: 25 mmol/L (ref 22–32)
Calcium: 8 mg/dL — ABNORMAL LOW (ref 8.9–10.3)
Chloride: 103 mmol/L (ref 98–111)
Creatinine, Ser: 1.03 mg/dL — ABNORMAL HIGH (ref 0.44–1.00)
GFR, Estimated: 57 mL/min — ABNORMAL LOW (ref >60)
Glucose, Bld: 117 mg/dL — ABNORMAL HIGH (ref 70–99)
Potassium: 3.5 mmol/L (ref 3.5–5.1)
Sodium: 141 mmol/L (ref 135–145)
Total Bilirubin: 1.1 mg/dL (ref 0.3–1.2)
Total Protein: 5.6 g/dL — ABNORMAL LOW (ref 6.5–8.1)

## 2023-01-04 LAB — GLUCOSE, CAPILLARY
Glucose-Capillary: 109 mg/dL — ABNORMAL HIGH (ref 70–99)
Glucose-Capillary: 142 mg/dL — ABNORMAL HIGH (ref 70–99)
Glucose-Capillary: 120 mg/dL — ABNORMAL HIGH (ref 70–99)
Glucose-Capillary: 122 mg/dL — ABNORMAL HIGH (ref 70–99)

## 2023-01-04 LAB — CBC
HCT: 40.1 % (ref 36.0–46.0)
Hemoglobin: 12.9 g/dL (ref 12.0–15.0)
MCH: 29.3 pg (ref 26.0–34.0)
MCHC: 32.2 g/dL (ref 30.0–36.0)
MCV: 91.1 fL (ref 80.0–100.0)
Platelets: 245 10*3/uL (ref 150–400)
RBC: 4.4 MIL/uL (ref 3.87–5.11)
RDW: 13.7 % (ref 11.5–15.5)
WBC: 7.2 10*3/uL (ref 4.0–10.5)
nRBC: 0 % (ref 0.0–0.2)

## 2023-01-04 LAB — CK: Total CK: 972 U/L — ABNORMAL HIGH (ref 38–234)

## 2023-01-04 LAB — DIGOXIN LEVEL: Digoxin Level: 0.4 ng/mL — ABNORMAL LOW (ref 0.8–2.0)

## 2023-01-04 MED ORDER — POTASSIUM CHLORIDE CRYS ER 20 MEQ PO TBCR
40.0000 meq | EXTENDED_RELEASE_TABLET | Freq: Once | ORAL | Status: AC
  Administered 2023-01-04: 40 meq via ORAL
  Filled 2023-01-04: qty 2

## 2023-01-04 NOTE — TOC Initial Note (Addendum)
Transition of Care [facility]) - Initial/Assessment Note    Patient Details  Name: Deanna Shea MRN: [id number] Date of Birth: 11/14/1945  [facility] of [facility]) CM/SW Contact:    Lorri Frederick, LCSW Phone Number: 01/04/2023, 11:21 AM  Clinical Narrative:      CSW met with pt regarding DC recommendation for SNF.  Pt lives home alone in apartment, no current services.  Pt initially states desire to DC to her son's home with [facility].  Permission given to speak with son and DIL.  CSW spoke with son Brett Canales and DIL   Cheralyn by speakerphone with pt also present.  They are both covid positive right now and unable to assist.  Pt then agreeable to SNF.  Medicare choice document provided.  Cheralyn is employee at [employer] but has already check their rehab and they are full.  Cheralyn requesting [address] [facility], [place], or other higher rated SNF.  Referral sent out in hub for SNF.  CSW reached out to [employer], [place].         Additional info uploaded to [place] [facility].    1445: PASSR received: [id number]  Expected Discharge Plan: Skilled Nursing Facility Barriers to Discharge: Continued Medical Work up, SNF Pending bed offer   Patient Goals and CMS Choice Patient states their goals for this hospitalization and ongoing recovery are:: back to normal CMS Medicare.gov Compare Post Acute Care list provided to:: Patient Choice offered to / list presented to : Patient      Expected Discharge Plan and Services     Post Acute Care Choice: Skilled Nursing Facility Living arrangements for the past 2 months: Apartment                                      Prior Living Arrangements/Services Living arrangements for the past 2 months: Apartment Lives with:: Spouse Patient language and need for interpreter reviewed:: Yes Do you feel safe going back to the place where you live?: Yes      Need for Family Participation in Patient Care: Yes (Comment) Care giver support system in place?: Yes  (comment) Current home services: Other (comment) (none) Criminal Activity/Legal Involvement Pertinent to Current Situation/Hospitalization: No - Comment as needed  Activities of Daily Living Home Assistive Devices/Equipment: None ADL Screening (condition at time of admission) Patient's cognitive ability adequate to safely complete daily activities?: Yes Is the patient deaf or have difficulty hearing?: No Does the patient have difficulty seeing, even when wearing glasses/contacts?: No Does the patient have difficulty concentrating, remembering, or making decisions?: No Patient able to express need for assistance with ADLs?: No Does the patient have difficulty dressing or bathing?: No Independently performs ADLs?: Yes (appropriate for developmental age) Does the patient have difficulty walking or climbing stairs?: Yes Weakness of Legs: Left Weakness of Arms/Hands: None  Permission Sought/Granted Permission sought to share information with : Family Supports Permission granted to share information with : Yes, Verbal Permission Granted  Share Information with NAME: son Brett Canales, [place] Cherylyn  Permission granted to share info w AGENCY: SNF        Emotional Assessment Appearance:: Appears stated age Attitude/Demeanor/Rapport: Engaged Affect (typically observed): Appropriate, Pleasant Orientation: : Oriented to Self, Oriented to Place, Oriented to  Time, Oriented to Situation      Admission diagnosis:  Rhabdomyolysis [M62.82] Patient Active Problem List   Diagnosis Date Noted   Rhabdomyolysis 01/03/2023   Hypothyroid 07/24/2021  Diet-controlled type 2 diabetes mellitus (HCC) 12/30/2015   SBO (small bowel obstruction) (HCC) 01/27/2014   Atrial fibrillation (HCC) 10/28/2013   Palpitations 10/28/2013   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   PCP:  Darrow Bussing, MD Pharmacy:   [facility] [phone] - [place], [place] - [address] AT [address]NER OF [facility] [address] [place] [address] Phone: [phone] Fax: [phone]     Social Determinants of Health (SDOH) Social History: SDOH Screenings   Food Insecurity: No Food Insecurity (01/03/2023)  Housing: Low Risk  (01/03/2023)  Transportation Needs: No Transportation Needs (01/03/2023)  Utilities: Not At Risk (01/03/2023)  Tobacco Use: Low Risk  (01/04/2023)   SDOH Interventions:     Readmission Risk Interventions     No data to display

## 2023-01-04 NOTE — Progress Notes (Signed)
   01/04/23 1353  Mobility  Activity Transferred from bed to chair  Level of Assistance Contact guard assist, steadying assist  Assistive Device Front wheel walker  Distance Ambulated (ft) 5 ft  Activity Response Tolerated well  Mobility Referral Yes  $Mobility charge 1 Mobility  Mobility Specialist Start Time (ACUTE ONLY) 1210  Mobility Specialist Stop Time (ACUTE ONLY) 1223  Mobility Specialist Time Calculation (min) (ACUTE ONLY) 13 min   Mobility Specialist: Progress Note  Pt agreeable to mobility session - received in bed. No patient complaints. Pt returned to chair with all needs met - call bell within reach.  Barnie Mort, BS Mobility Specialist Please contact via SecureChat or Rehab office at [phone].

## 2023-01-04 NOTE — Progress Notes (Signed)
[facility] PROGRESS NOTE   Deanna Shea NWG:[id number] DOB: 1945-12-20 DOA: 01/02/2023  PCP: Darrow Bussing, MD  Brief History/Interval Summary: 77 y.o. female with a known history of anxiety, depression, atrial fibrillation on aspirin, diabetes, mitral valve prolapse, schizophrenia presented to the emergency department for evaluation of fall.  Patient was in a usual state of health until 3 days prior to this admission when she fell in her home, states that it was a mechanical fall but she has been unable to get up due to weakness and pain.  Her daughter called for help when she could not get in touch with patient.  She was brought into the emergency department.  Was found to have rhabdomyolysis and was hospitalized.  Consultants: None  Procedures: None    Subjective/Interval History: Patient mentions that she is feeling better.  Denies any abdominal pain nausea vomiting.  Strength appears to be improving.  Denies any dysuria or hematuria.  No flank pain.   Assessment/Plan:  Rhabdomyolysis Secondary to mechanical fall.  CK level was 2675.  Patient was started on IV fluids.  Reason for her fall is not entirely clear.  She denies any syncopal episode.  She mentions that she stumbled on something and fell but could not get up.  No injuries identified on imaging studies or examination.   CK level continues to improve.  Creatinine noted to be slightly high today.  Will recheck labs tomorrow. Seen by PT and OT.  Skilled nursing facility is recommended for short-term rehab. Continue IV fluids for another 24 hours and then discontinue.  History of atrial fibrillation Noted to be on aspirin digoxin and propranolol.  Digoxin level is 0.4.  Nephrolithiasis/ureterolithiasis Incidentally noted to have kidney stones and also a stone in the mid left ureter.  No hydronephrosis noted.  Patient currently asymptomatic.   UA still pending.  Will proceed with CT renal stone study tomorrow.     Hyperlipidemia Hold statin for now.  History of anxiety depression and schizophrenia Continue Lexapro and Risperdal.  History of hypothyroidism Continue levothyroxine.  History of diabetes mellitus type 2 Holding metformin.  Check CBGs.  SSI.  Incidental cholelithiasis Asymptomatic.  LFTs unremarkable.  DVT Prophylaxis: Lovenox Code Status: Full code Family Communication: Discussed with patient.  No family at bedside Disposition Plan: SNF  Status is: Inpatient Remains inpatient appropriate because: Rhabdomyolysis, ambulatory dysfunction      Medications: Scheduled:  aspirin EC  81 mg Oral Daily   digoxin  0.25 mg Oral Once per day on Monday Wednesday Friday   enoxaparin (LOVENOX) injection  40 mg Subcutaneous Q24H   escitalopram  20 mg Oral Daily   insulin aspart  0-5 Units Subcutaneous QHS   insulin aspart  0-9 Units Subcutaneous TID WC   levothyroxine  88 mcg Oral QAC breakfast   propranolol  20 mg Oral QID   risperiDONE  2 mg Oral QHS   sodium chloride flush  3 mL Intravenous Q12H   Continuous:  sodium chloride 75 mL/hr at 01/04/23 0450   VHQ:[id number] **OR** acetaminophen, albuterol, bisacodyl, hydrALAZINE, HYDROcodone-acetaminophen, ipratropium, morphine injection, ondansetron **OR** ondansetron (ZOFRAN) IV, senna-docusate, traZODone  Antibiotics: Anti-infectives (From admission, onward)    None       Objective:  Vital Signs  Vitals:   01/03/23 1348 01/03/23 2104 01/04/23 0412 01/04/23 0740  BP: 113/60 (!) 104/55 (!) 122/49 (!) 123/47  Pulse: 60 62 63 63  Resp: 16 18 18 16   Temp: 97.8 F (36.6 C) 98.3 F (36.8 C)  98.6 F (37 C) 98.4 F (36.9 C)  TempSrc: Oral Oral Oral   SpO2: 95% 98% 98% 99%  Weight:      Height:        Intake/Output Summary (Last 24 hours) at 01/04/2023 1112 Last data filed at 01/04/2023 0900 Gross per 24 hour  Intake 1823.7 ml  Output 1260 ml  Net 563.7 ml   Filed Weights   01/02/23 2207 01/03/23 0252  01/03/23 0513  Weight: 73 kg 73.9 kg 73.9 kg    General appearance: Awake alert.  In no distress Resp: Clear to auscultation bilaterally.  Normal effort Cardio: S1-S2 is normal regular.  No S3-S4.  No rubs murmurs or bruit GI: Abdomen is soft.  Nontender nondistended.  Bowel sounds are present normal.  No masses organomegaly Extremities: No edema.  Full range of motion of lower extremities. Neurologic: Alert and oriented x3.  No focal neurological deficits.     Lab Results:  Data Reviewed: I have personally reviewed following labs and reports of the imaging studies  CBC: Recent Labs  Lab 01/02/23 2218 01/04/23 0539  WBC 10.7* 7.2  NEUTROABS 9.1*  --   HGB 15.6* 12.9  HCT 47.6* 40.1  MCV 91.2 91.1  PLT 326 245    Basic Metabolic Panel: Recent Labs  Lab 01/02/23 2218 01/03/23 1423 01/04/23 0539  NA 144 139 141  K 3.6 3.6 3.5  CL 102 101 103  CO2 27 24 25   GLUCOSE 205* 143* 117*  BUN 46* 32* 22  CREATININE 0.98 0.66 1.03*  CALCIUM 9.9 8.2* 8.0*  MG 2.3  --   --     GFR: Estimated Creatinine Clearance: 45.9 mL/min (A) (by C-G formula based on SCr of 1.03 mg/dL (H)).  Cardiac Enzymes: Recent Labs  Lab 01/02/23 2218 01/03/23 1423 01/04/23 0539  CKTOTAL 2,675* 1,742* 972*    CBG: Recent Labs  Lab 01/03/23 0748 01/03/23 1127 01/03/23 1611 01/03/23 2106 01/04/23 0738  GLUCAP 153* 152* 142* 125* 109*    Radiology Studies: CT Lumbar Spine Wo Contrast  Result Date: 01/03/2023 CLINICAL DATA:  Found down EXAM: CT LUMBAR SPINE WITHOUT CONTRAST TECHNIQUE: Multidetector CT imaging of the lumbar spine was performed without intravenous contrast administration. Multiplanar CT image reconstructions were also generated. RADIATION DOSE REDUCTION: This exam was performed according to the departmental dose-optimization program which includes automated exposure control, adjustment of the mA and/or kV according to patient size and/or use of iterative reconstruction  technique. COMPARISON:  None Available. FINDINGS: Segmentation: 5 lumbar type vertebrae. Alignment: Normal. Vertebrae: No acute fracture or focal pathologic process. Paraspinal and other soft tissues: Cholelithiasis. There are multiple bilateral renal calculi. There is a 3 mm calculus in the mid left ureter. No hydronephrosis. Disc levels: No spinal canal stenosis. Degenerative disc disease is greatest at L5-S1. Mild right L5 foraminal stenosis. IMPRESSION: 1. No acute fracture or traumatic malalignment of the lumbar spine. 2. A 3 mm calculus in the mid left ureter. No hydronephrosis. 3. Cholelithiasis and nephrolithiasis. Electronically Signed   By: Deatra Robinson M.D.   On: 01/03/2023 00:21   CT Hip Left Wo Contrast  Result Date: 01/03/2023 CLINICAL DATA:  Trauma to the left hip. Pain. Concern for fracture. EXAM: CT OF THE LEFT HIP WITHOUT CONTRAST TECHNIQUE: Multidetector CT imaging of the left hip was performed according to the standard protocol. Multiplanar CT image reconstructions were also generated. RADIATION DOSE REDUCTION: This exam was performed according to the departmental dose-optimization program which includes automated exposure control, adjustment  of the mA and/or kV according to patient size and/or use of iterative reconstruction technique. COMPARISON:  CT abdomen pelvis dated 01/27/2014. FINDINGS: Bones/Joint/Cartilage No acute fracture or dislocation. Mild degenerative changes of the left hip. No joint effusion. Ligaments Suboptimally assessed by CT. Muscles and Tendons No acute findings.  No intramuscular fluid collection or hematoma. Soft tissues No fluid collection or hematoma. Mild stranding of the subcutaneous fat of the lateral left hip. A string of adjacent stones in the left hemipelvis measuring approximately 2.5 cm in length may be vascular calcification but appear to be in the distal left ureter. Additional small stones noted along the left posterior bladder wall. CT of the abdomen  pelvis may provide better evaluation. IMPRESSION: 1. No acute fracture or dislocation. 2. Probable string of stones in the distal left ureter as well as stone in the urinary bladder. 3. CT of the abdomen pelvis may provide better evaluation. Electronically Signed   By: Elgie Collard M.D.   On: 01/03/2023 00:21   CT Head Wo Contrast  Result Date: 01/02/2023 CLINICAL DATA:  Found down on floor, initial encounter EXAM: CT HEAD WITHOUT CONTRAST TECHNIQUE: Contiguous axial images were obtained from the base of the skull through the vertex without intravenous contrast. RADIATION DOSE REDUCTION: This exam was performed according to the departmental dose-optimization program which includes automated exposure control, adjustment of the mA and/or kV according to patient size and/or use of iterative reconstruction technique. COMPARISON:  None Available. FINDINGS: Brain: No evidence of acute infarction, hemorrhage, hydrocephalus, extra-axial collection or mass lesion/mass effect. Mild atrophic changes and chronic white matter ischemic changes noted. Vascular: No hyperdense vessel or unexpected calcification. Skull: Normal. Negative for fracture or focal lesion. Sinuses/Orbits: No acute finding. Chronic appearing mucosal thickening is noted within the left maxillary antrum. Bilateral antrectomies are seen. Other: None. IMPRESSION: Chronic atrophic and ischemic changes without acute abnormality. Changes of chronic sinusitis. Electronically Signed   By: Alcide Clever M.D.   On: 01/02/2023 23:28       LOS: 1 day   Osvaldo Shipper  [facility] Pager on [email or web address]  01/04/2023, 11:12 AM

## 2023-01-04 NOTE — Progress Notes (Signed)
RE:  Deanna Shea       Date of Birth:  2045/09/17     Date:   01/04/23       To Whom It May Concern:  Please be advised that the above-named patient will require a short-term nursing home stay - anticipated 30 days or less for rehabilitation and strengthening.  The plan is for return home.                 MD signature                Date

## 2023-01-04 NOTE — [place] FL2 (Signed)
[place] MEDICAID FL2 LEVEL OF CARE FORM     IDENTIFICATION  Patient Name: Deanna Shea Birthdate: July 21, 1945 Sex: female Admission Date (Current Location): 01/02/2023  [facility] and [place] Number:  Producer, television/film/video and Address:  [facility] [place], [address] [place], [place] [address]      Provider Number: [id number]  Attending Physician Name and Address:  Osvaldo Shipper, MD  Relative Name and Phone Number:  Esha, Sirkin   [phone]    Current Level of Care: Hospital Recommended Level of Care: Skilled Nursing Facility Prior Approval Number:    Date Approved/Denied:   PASRR Number:    Discharge Plan: SNF    Current Diagnoses: Patient Active Problem List   Diagnosis Date Noted   Rhabdomyolysis 01/03/2023   Hypothyroid 07/24/2021   Diet-controlled type 2 diabetes mellitus (HCC) 12/30/2015   SBO (small bowel obstruction) (HCC) 01/27/2014   Atrial fibrillation (HCC) 10/28/2013   Palpitations 10/28/2013   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    Orientation RESPIRATION BLADDER Height & Weight     Self, Time, Situation, Place  Normal Continent Weight: 162 lb 14.7 oz (73.9 kg) Height:  5\' 7"  (170.2 cm)  BEHAVIORAL SYMPTOMS/MOOD NEUROLOGICAL BOWEL NUTRITION STATUS      Incontinent Diet (see discharge summary)  AMBULATORY STATUS COMMUNICATION OF NEEDS Skin   Limited Assist Verbally Other (Comment) (redness)                       Personal Care Assistance Level of Assistance  Bathing, Feeding, Dressing Bathing Assistance: Limited assistance Feeding assistance: Independent Dressing Assistance: Limited assistance     Functional Limitations Info  Sight, Hearing, Speech Sight Info: Adequate Hearing Info: Adequate Speech Info: Adequate    SPECIAL CARE FACTORS FREQUENCY  PT (By licensed PT), OT (By licensed OT)     PT Frequency: 5x week OT Frequency: 5x week            Contractures Contractures Info: Not present     Additional Factors Info  Code Status, Allergies, Insulin Sliding Scale Code Status Info: full Allergies Info: sulfa antibiotics   Insulin Sliding Scale Info: Novolog: see discharge summary       Current Medications (01/04/2023):  This is the current hospital active medication list Current Facility-Administered Medications  Medication Dose Route Frequency Provider Last Rate Last Admin   0.9 %  sodium chloride infusion   Intravenous Continuous Hugelmeyer, Alexis, DO 75 mL/hr at 01/04/23 0450 Infusion Verify at 01/04/23 0450   acetaminophen (TYLENOL) tablet 650 mg  650 mg Oral Q6H PRN Hugelmeyer, Alexis, DO       Or   acetaminophen (TYLENOL) suppository 650 mg  650 mg Rectal Q6H PRN Hugelmeyer, Alexis, DO       albuterol (PROVENTIL) (2.5 MG/3ML) 0.083% nebulizer solution 2.5 mg  2.5 mg Nebulization Q6H PRN Hugelmeyer, Alexis, DO       aspirin EC tablet 81 mg  81 mg Oral Daily Hugelmeyer, Alexis, DO   81 mg at 01/04/23 0942   bisacodyl (DULCOLAX) EC tablet 5 mg  5 mg Oral Daily PRN Hugelmeyer, Alexis, DO       digoxin (LANOXIN) tablet 0.25 mg  0.25 mg Oral Once per day on Monday Wednesday Friday Hugelmeyer, Alexis, DO   0.25 mg at 01/03/23 0829   enoxaparin (LOVENOX) injection 40 mg  40 mg Subcutaneous Q24H Hugelmeyer, Alexis, DO   40 mg at 01/04/23 0942   escitalopram (LEXAPRO) tablet 20 mg  20  mg Oral Daily Hugelmeyer, Alexis, DO   20 mg at 01/04/23 6578   hydrALAZINE (APRESOLINE) injection 5 mg  5 mg Intravenous Q8H PRN Hugelmeyer, Alexis, DO       HYDROcodone-acetaminophen (NORCO/VICODIN) 5-325 MG per tablet 1-2 tablet  1-2 tablet Oral Q4H PRN Hugelmeyer, Alexis, DO       insulin aspart (novoLOG) injection 0-5 Units  0-5 Units Subcutaneous QHS Hugelmeyer, Alexis, DO       insulin aspart (novoLOG) injection 0-9 Units  0-9 Units Subcutaneous TID WC Hugelmeyer, Alexis, DO   1 Units at 01/03/23 1630   ipratropium (ATROVENT) nebulizer solution 0.5 mg  0.5 mg Nebulization Q6H PRN Hugelmeyer,  Alexis, DO       levothyroxine (SYNTHROID) tablet 88 mcg  88 mcg Oral QAC breakfast Hugelmeyer, Alexis, DO   88 mcg at 01/04/23 4696   morphine (PF) 2 MG/ML injection 1 mg  1 mg Intravenous Q6H PRN Hugelmeyer, Alexis, DO       ondansetron (ZOFRAN) tablet 4 mg  4 mg Oral Q6H PRN Hugelmeyer, Alexis, DO       Or   ondansetron (ZOFRAN) injection 4 mg  4 mg Intravenous Q6H PRN Hugelmeyer, Alexis, DO       propranolol (INDERAL) tablet 20 mg  20 mg Oral QID Hugelmeyer, Alexis, DO   20 mg at 01/04/23 2952   risperiDONE (RISPERDAL) tablet 2 mg  2 mg Oral QHS Hugelmeyer, Alexis, DO   2 mg at 01/03/23 2224   senna-docusate (Senokot-S) tablet 1 tablet  1 tablet Oral QHS PRN Hugelmeyer, Alexis, DO       sodium chloride flush (NS) 0.9 % injection 3 mL  3 mL Intravenous Q12H Hugelmeyer, Alexis, DO   3 mL at 01/04/23 0943   traZODone (DESYREL) tablet 25 mg  25 mg Oral QHS PRN Hugelmeyer, Alexis, DO         Discharge Medications: Please see discharge summary for a list of discharge medications.  Relevant Imaging Results:  Relevant Lab Results:   Additional Information SSN: [id number]  Lorri Frederick, LCSW

## 2023-01-04 NOTE — Evaluation (Signed)
Occupational Therapy Evaluation Patient Details Name: Deanna Shea MRN: [id number] DOB: 1945/11/03 Today's Date: 01/04/2023   History of Present Illness Deanna Shea is a 77 y.o. female  presents to the emergency department for evaluation of fall.  Patient was in a usual state of health until 3 days ago when she fell in her home, states that it was a mechanical fall but she has been unable to get up due to weakness and pain; down for days, and with rhabdomyolysis; with a known history of anxiety, depression, atrial fibrillation on aspirin, diabetes, mitral valve prolapse, schizophrenia   Clinical Impression     Patient lives at home at home alone, in a single-level home with 4 steps to enter and reports being Independent in ADLs and IADLs.  Patient currently requires minA  for functional mobility with RW and Mod A for LB ADLs and requires for A for safety, fall prevention and independence  and decreased endurance in ADLs. Patient would benefit from additional OT intervention to address functional deficits listed above in order for patient tot return to PLOF.       If plan is discharge home, recommend the following: A little help with walking and/or transfers;A little help with bathing/dressing/bathroom;Assist for transportation;Direct supervision/assist for financial management;Direct supervision/assist for medications management    Functional Status Assessment  Patient has had a recent decline in their functional status and demonstrates the ability to make significant improvements in function in a reasonable and predictable amount of time.  Equipment Recommendations   (defer to next level of care)    Recommendations for Other Services       Precautions / Restrictions Precautions Precautions: Fall Restrictions Weight Bearing Restrictions: No      Mobility Bed Mobility Overal bed mobility:  (Patient sitting upright in chair upon arrival into room)                   Transfers Overall transfer level: Needs assistance Equipment used: Rolling walker (2 wheels) Transfers: Sit to/from Stand Sit to Stand: Min assist                  Balance Overall balance assessment: Needs assistance                                         ADL either performed or assessed with clinical judgement   ADL Overall ADL's : Needs assistance/impaired Eating/Feeding: Set up;Sitting   Grooming: Wash/dry face;Wash/dry hands;Standing;Contact guard assist   Upper Body Bathing: Set up;Sitting   Lower Body Bathing: Moderate assistance;Sitting/lateral leans   Upper Body Dressing : Set up;Sitting   Lower Body Dressing: Moderate assistance   Toilet Transfer: Minimal assistance;BSC/3in1   Toileting- Clothing Manipulation and Hygiene: Minimal assistance       Functional mobility during ADLs: Contact guard assist;Rolling walker (2 wheels)       Vision Baseline Vision/History: 0 No visual deficits Patient Visual Report: No change from baseline       Perception         Praxis         Pertinent Vitals/Pain Pain Assessment Pain Assessment: No/denies pain     Extremity/Trunk Assessment Upper Extremity Assessment Upper Extremity Assessment: Generalized weakness           Communication Communication Communication: No apparent difficulties   Cognition Arousal: Alert Behavior During Therapy: WFL for tasks assessed/performed Overall Cognitive Status: No family/caregiver  present to determine baseline cognitive functioning                                 General Comments: Slow to answer questions     General Comments       Exercises     Shoulder Instructions      Home Living Family/patient expects to be discharged to:: Private residence Living Arrangements: Alone Available Help at Discharge: Family;Available PRN/intermittently Type of Home: Apartment Home Access: Stairs to enter Entrance Stairs-Number of  Steps: 4 Entrance Stairs-Rails: Left Home Layout: One level     Bathroom Shower/Tub: Chief Strategy Officer: Standard     Home Equipment: None          Prior Functioning/Environment Prior Level of Function : Independent/Modified Independent             Mobility Comments: walks without assistive device          OT Problem List: Decreased strength;Decreased activity tolerance;Impaired balance (sitting and/or standing)      OT Treatment/Interventions: Self-care/ADL training;Therapeutic exercise;Neuromuscular education;Therapeutic activities;Patient/family education    OT Goals(Current goals can be found in the care plan section) Acute Rehab OT Goals OT Goal Formulation: With patient Time For Goal Achievement: 01/18/23 Potential to Achieve Goals: Good  OT Frequency: Min 1X/week    Co-evaluation              AM-PAC OT "6 Clicks" Daily Activity     Outcome Measure Help from another person eating meals?: None Help from another person taking care of personal grooming?: A Little Help from another person toileting, which includes using toliet, bedpan, or urinal?: A Lot Help from another person bathing (including washing, rinsing, drying)?: A Lot Help from another person to put on and taking off regular upper body clothing?: A Little Help from another person to put on and taking off regular lower body clothing?: A Lot 6 Click Score: 16   End of Session Equipment Utilized During Treatment: Rolling walker (2 wheels) Nurse Communication: Mobility status  Activity Tolerance: Patient tolerated treatment well Patient left: in bed;with call bell/phone within reach  OT Visit Diagnosis: Unsteadiness on feet (R26.81);Muscle weakness (generalized) (M62.81)                Time: 1610-9604 OT Time Calculation (min): 26 min Charges:  OT General Charges $OT Visit: 1 Visit OT Treatments $Self Care/Home Management : 8-22 mins  Governor Specking OT/L  Denice Paradise 01/04/2023, 4:21 PM

## 2023-01-05 ENCOUNTER — Inpatient Hospital Stay (HOSPITAL_COMMUNITY): Payer: Medicare HMO

## 2023-01-05 DIAGNOSIS — T796XXA Traumatic ischemia of muscle, initial encounter: Secondary | ICD-10-CM | POA: Diagnosis not present

## 2023-01-05 LAB — MAGNESIUM: Magnesium: 1.9 mg/dL (ref 1.7–2.4)

## 2023-01-05 LAB — BASIC METABOLIC PANEL
Anion gap: 7 (ref 5–15)
BUN: 16 mg/dL (ref 8–23)
CO2: 28 mmol/L (ref 22–32)
Calcium: 8.3 mg/dL — ABNORMAL LOW (ref 8.9–10.3)
Chloride: 103 mmol/L (ref 98–111)
Creatinine, Ser: 0.69 mg/dL (ref 0.44–1.00)
GFR, Estimated: >60 mL/min (ref >60)
Glucose, Bld: 127 mg/dL — ABNORMAL HIGH (ref 70–99)
Potassium: 3.6 mmol/L (ref 3.5–5.1)
Sodium: 138 mmol/L (ref 135–145)

## 2023-01-05 LAB — GLUCOSE, CAPILLARY
Glucose-Capillary: 166 mg/dL — ABNORMAL HIGH (ref 70–99)
Glucose-Capillary: 108 mg/dL — ABNORMAL HIGH (ref 70–99)
Glucose-Capillary: 165 mg/dL — ABNORMAL HIGH (ref 70–99)
Glucose-Capillary: 158 mg/dL — ABNORMAL HIGH (ref 70–99)

## 2023-01-05 LAB — CK: Total CK: 381 U/L — ABNORMAL HIGH (ref 38–234)

## 2023-01-05 MED ORDER — ACETAMINOPHEN 325 MG PO TABS: 650.0000 mg | ORAL_TABLET | Freq: Four times a day (QID) | ORAL | Status: AC | PRN

## 2023-01-05 NOTE — Progress Notes (Signed)
Physical Therapy Treatment Patient Details Name: Deanna Shea MRN: [id number] DOB: Oct 09, 1945 Today's Date: 01/05/2023   History of Present Illness The pt is a 77 yo female presenting 8/27 after a mechanical fall at home. Pt reports she fell on 8/25 and was unable to get up on her own or call for help, family checked on her 8/27 where she was found down. Pt found to have rhabdomyolysis. PMH includes: anxiety, depression, atrial fibrillation on aspirin, diabetes, mitral valve prolapse, schizophrenia    PT Comments  The pt was agreeable to session, able to make great improvements in ambulation distance and needing only CGA with gait this session. LE strength and power were challenged by repeated sit-stand transfers from progressively lower seat height. Pt needing up to minA to complete sit-stand transfers, will continue to benefit from skilled PT to progress functional weakness in LE as well as to improve dynamic stability and reduce risk of falls prior to return home alone.     If plan is discharge home, recommend the following: A lot of help with walking and/or transfers;A lot of help with bathing/dressing/bathroom;Assistance with cooking/housework;Direct supervision/assist for medications management;Direct supervision/assist for financial management;Assist for transportation;Help with stairs or ramp for entrance;Supervision due to cognitive status   Can travel by private vehicle     Yes  Equipment Recommendations  Rolling walker (2 wheels);BSC/3in1    Recommendations for Other Services       Precautions / Restrictions Precautions Precautions: Fall Restrictions Weight Bearing Restrictions: No     Mobility  Bed Mobility               General bed mobility comments: pt OOB in recliner at start and end of session    Transfers Overall transfer level: Needs assistance Equipment used: Rolling walker (2 wheels) Transfers: Sit to/from Stand Sit to Stand: Min assist, Contact guard  assist           General transfer comment: progressed from minA from low surface without use of UE support to GCA with elevated bed or use of UE. completed x12 in session    Ambulation/Gait Ambulation/Gait assistance: Contact guard assist Gait Distance (Feet): 125 Feet Assistive device: Rolling walker (2 wheels) Gait Pattern/deviations: Step-through pattern, Decreased step length - right, Decreased step length - left Gait velocity: decreased Gait velocity interpretation: <1.31 ft/sec, indicative of household ambulator   General Gait Details: cues for RW proximity, pt without overt LOB, reports fatigue      Balance Overall balance assessment: Needs assistance Sitting-balance support: No upper extremity supported Sitting balance-Leahy Scale: Fair Sitting balance - Comments: minor posterior LOB with challenge   Standing balance support: No upper extremity supported, Bilateral upper extremity supported Standing balance-Leahy Scale: Fair Standing balance comment: can complete sit-stand without UE support, BUE support for gait                            Cognition Arousal: Alert Behavior During Therapy: WFL for tasks assessed/performed Overall Cognitive Status: No family/caregiver present to determine baseline cognitive functioning                                 General Comments: pt slow to answer questions and with flat affect, following all commands well and A&Ox4.        Exercises General Exercises - Lower Extremity Long Arc Quad: AROM, Both, 15 reps, Seated Hip Flexion/Marching: AROM, Both,  10 reps, Seated Heel Raises: AROM, Both, 10 reps, Seated Other Exercises Other Exercises: repeated sit-stand from progressively lower EOB, x12 in total Other Exercises: seated cross-body punches to increase core activation and balance perturbations. mild LOB but no physical assist given. x15    General Comments General comments (skin integrity, edema,  etc.): VSS on RA      Pertinent Vitals/Pain Pain Assessment Pain Assessment: Faces Faces Pain Scale: Hurts a little bit Pain Location: low back Pain Descriptors / Indicators: Sore Pain Intervention(s): Limited activity within patient's tolerance, Monitored during session, Repositioned     PT Goals (current goals can now be found in the care plan section) Acute Rehab PT Goals Patient Stated Goal: be able to walk safely PT Goal Formulation: With patient Time For Goal Achievement: 01/17/23 Potential to Achieve Goals: Good Progress towards PT goals: Progressing toward goals    Frequency    Min 1X/week       AM-PAC PT "6 Clicks" Mobility   Outcome Measure  Help needed turning from your back to your side while in a flat bed without using bedrails?: None Help needed moving from lying on your back to sitting on the side of a flat bed without using bedrails?: A Little Help needed moving to and from a bed to a chair (including a wheelchair)?: A Little Help needed standing up from a chair using your arms (e.g., wheelchair or bedside chair)?: A Little Help needed to walk in hospital room?: A Little Help needed climbing 3-5 steps with a railing? : A Lot 6 Click Score: 18    End of Session Equipment Utilized During Treatment: Gait belt Activity Tolerance: Patient tolerated treatment well Patient left: with call bell/phone within reach;in chair;with chair alarm set Nurse Communication: Mobility status PT Visit Diagnosis: Unsteadiness on feet (R26.81);Other abnormalities of gait and mobility (R26.89)     Time: 8657-8469 PT Time Calculation (min) (ACUTE ONLY): 27 min  Charges:    $Gait Training: 8-22 mins $Therapeutic Exercise: 8-22 mins PT General Charges $$ ACUTE PT VISIT: 1 Visit                     Vickki Muff, PT, DPT   Acute Rehabilitation Department Office [phone] Secure Chat Communication Preferred   Ronnie Derby 01/05/2023, 12:36 PM

## 2023-01-05 NOTE — Plan of Care (Signed)

## 2023-01-05 NOTE — Discharge Summary (Addendum)
[facility]  Physician Discharge Summary   Patient ID: Deanna Shea MRN: [id number] DOB/AGE: [birth year] 75 y.o.  Admit date: 01/02/2023 Discharge date:   01/05/2023   PCP: Darrow Bussing, MD  DISCHARGE DIAGNOSES:    Rhabdomyolysis Nephrolithiasis Paroxysmal atrial fibrillation Hyperlipidemia History of anxiety depression and schizophrenia Hypothyroidism Diabetes mellitus type 2 Incidental cholelithiasis   RECOMMENDATIONS FOR OUTPATIENT FOLLOW UP: Please check CBC and basic metabolic panel in 1 week May need to be referred to urology for nephrolithiasis   Home Health: Going to SNF Equipment/Devices: None  CODE STATUS: Full code  DISCHARGE CONDITION: fair  Diet recommendation: Heart healthy  INITIAL HISTORY: 77 y.o. female with a known history of anxiety, depression, atrial fibrillation on aspirin, diabetes, mitral valve prolapse, schizophrenia presented to the emergency department for evaluation of fall.  Patient was in a usual state of health until 3 days prior to this admission when she fell in her home, states that it was a mechanical fall but she has been unable to get up due to weakness and pain.  Her daughter called for help when she could not get in touch with patient.  She was brought into the emergency department.  Was found to have rhabdomyolysis and was hospitalized.    HOSPITAL COURSE:   Rhabdomyolysis Secondary to mechanical fall.  CK level was 2675.  Patient was started on IV fluids.  Reason for her fall is not entirely clear.  She denies any syncopal episode.  She mentions that she stumbled on something and fell but could not get up.  No injuries identified on imaging studies or examination.   CK level has significantly improved.  Renal function is normal this morning.  Okay to discontinue IV fluids. Seen by PT and OT.  Skilled nursing facility is recommended for short-term rehab.   Paroxysmal atrial fibrillation Noted to be on aspirin digoxin  and propranolol.  Digoxin level is 0.4.   Nephrolithiasis/ureterolithiasis Incidentally noted to have kidney stones and also a stone in the mid left ureter.  No hydronephrosis noted.  Patient currently asymptomatic.   UA still pending.  CT renal stone study to be done today to make sure there is no hydronephrosis.  Discussed with patient's daughter-in-law who mentioned that patient has a known history of nephrolithiasis and passes stones on a regular basis.  She does not know if she has been seen by urology but she will find out. Patient appears to be asymptomatic but does complain of back pain today which is most probably due to position and musculoskeletal in etiology but we will see if the CT renal study shows any acute findings.      Hyperlipidemia Okay to resume statin   History of anxiety depression and schizophrenia Continue Lexapro and Risperdal.   History of hypothyroidism Continue levothyroxine.   History of diabetes mellitus type 2 Okay to resume home medications   Incidental cholelithiasis Asymptomatic.  LFTs unremarkable.  Patient is stable.  Okay for discharge to SNF if CT renal stone study is unremarkable.  CT renal stone study did not show any obstructing stones.  No hydronephrosis noted.  Raised concern for thickened rectal wall.  However patient is asymptomatic and having regular bowel movements.  No need to proceed with this at this time.  No concern for any infection in that area.  Okay for discharge to SNF.  PERTINENT LABS:  The results of significant diagnostics from this hospitalization (including imaging, microbiology, ancillary and laboratory) are listed below for reference.  Labs:   Basic Metabolic Panel: Recent Labs  Lab 01/02/23 2218 01/03/23 1423 01/04/23 0539 01/05/23 0652  NA 144 139 141 138  K 3.6 3.6 3.5 3.6  CL 102 101 103 103  CO2 27 24 25 28   GLUCOSE 205* 143* 117* 127*  BUN 46* 32* 22 16  CREATININE 0.98 0.66 1.03* 0.69  CALCIUM  9.9 8.2* 8.0* 8.3*  MG 2.3  --   --  1.9   Liver Function Tests: Recent Labs  Lab 01/04/23 0539  AST 45*  ALT 37  ALKPHOS 55  BILITOT 1.1  PROT 5.6*  ALBUMIN 2.5*    CBC: Recent Labs  Lab 01/02/23 2218 01/04/23 0539  WBC 10.7* 7.2  NEUTROABS 9.1*  --   HGB 15.6* 12.9  HCT 47.6* 40.1  MCV 91.2 91.1  PLT 326 245   Cardiac Enzymes: Recent Labs  Lab 01/02/23 2218 01/03/23 1423 01/04/23 0539 01/05/23 0652  CKTOTAL 2,675* 1,742* 972* 381*     CBG: Recent Labs  Lab 01/04/23 1137 01/04/23 1818 01/04/23 2156 01/05/23 0748 01/05/23 1132  GLUCAP 142* 120* 122* 166* 108*     IMAGING STUDIES CT RENAL STONE STUDY  Result Date: 01/05/2023 CLINICAL DATA:  Urolithiasis * Tracking Code: BO * EXAM: CT ABDOMEN AND PELVIS WITHOUT CONTRAST TECHNIQUE: Multidetector CT imaging of the abdomen and pelvis was performed following the standard protocol without IV contrast. RADIATION DOSE REDUCTION: This exam was performed according to the departmental dose-optimization program which includes automated exposure control, adjustment of the mA and/or kV according to patient size and/or use of iterative reconstruction technique. COMPARISON:  01/27/2014 FINDINGS: Lower chest: No acute abnormality. Hepatobiliary: No solid liver abnormality is seen. Gallstones. No gallbladder wall thickening, or biliary dilatation. Pancreas: Unremarkable. No pancreatic ductal dilatation or surrounding inflammatory changes. Spleen: Normal in size without significant abnormality. Adrenals/Urinary Tract: Adrenal glands are unremarkable. Multiple bilateral nonobstructive renal calculi as well as medullary nephrocalcinosis. Bladder is unremarkable. Stomach/Bowel: Stomach is within normal limits. Status post appendectomy. Distal small bowel resection and reanastomosis in the right lower quadrant. Circumferential wall thickening of the rectum with adjacent perirectal and presacral fat stranding (series 3, image 74).  Vascular/Lymphatic: Aortic atherosclerosis. Multiple small phleboliths in the pelvis, several of which are adjacent to the ureters, however are not favored to reflect urinary tract calculi. No enlarged abdominal or pelvic lymph nodes. Reproductive: No mass or other significant abnormality. Other: No abdominal wall hernia or abnormality. No ascites. Musculoskeletal: No acute or significant osseous findings. IMPRESSION: 1. Circumferential wall thickening of the rectum with adjacent perirectal and presacral fat stranding, consistent with proctitis although this appearance is worrisome for an underlying rectal malignancy. 2. Multiple bilateral nonobstructive renal calculi as well as medullary nephrocalcinosis. No hydronephrosis. 3. Multiple small phleboliths in the pelvis, several of which are adjacent to the ureters, however not favored to reflect urinary tract calculi. These could be more confidently excluded by contrast enhanced CT urogram including urographic delayed phase imaging if desired. 4. Cholelithiasis. Aortic Atherosclerosis (ICD10-I70.0). Electronically Signed   By: Jearld Lesch M.D.   On: 01/05/2023 13:09   CT Lumbar Spine Wo Contrast  Result Date: 01/03/2023 CLINICAL DATA:  Found down EXAM: CT LUMBAR SPINE WITHOUT CONTRAST TECHNIQUE: Multidetector CT imaging of the lumbar spine was performed without intravenous contrast administration. Multiplanar CT image reconstructions were also generated. RADIATION DOSE REDUCTION: This exam was performed according to the departmental dose-optimization program which includes automated exposure control, adjustment of the mA and/or kV according to patient size  and/or use of iterative reconstruction technique. COMPARISON:  None Available. FINDINGS: Segmentation: 5 lumbar type vertebrae. Alignment: Normal. Vertebrae: No acute fracture or focal pathologic process. Paraspinal and other soft tissues: Cholelithiasis. There are multiple bilateral renal calculi. There is a  3 mm calculus in the mid left ureter. No hydronephrosis. Disc levels: No spinal canal stenosis. Degenerative disc disease is greatest at L5-S1. Mild right L5 foraminal stenosis. IMPRESSION: 1. No acute fracture or traumatic malalignment of the lumbar spine. 2. A 3 mm calculus in the mid left ureter. No hydronephrosis. 3. Cholelithiasis and nephrolithiasis. Electronically Signed   By: Deatra Robinson M.D.   On: 01/03/2023 00:21   CT Hip Left Wo Contrast  Result Date: 01/03/2023 CLINICAL DATA:  Trauma to the left hip. Pain. Concern for fracture. EXAM: CT OF THE LEFT HIP WITHOUT CONTRAST TECHNIQUE: Multidetector CT imaging of the left hip was performed according to the standard protocol. Multiplanar CT image reconstructions were also generated. RADIATION DOSE REDUCTION: This exam was performed according to the departmental dose-optimization program which includes automated exposure control, adjustment of the mA and/or kV according to patient size and/or use of iterative reconstruction technique. COMPARISON:  CT abdomen pelvis dated 01/27/2014. FINDINGS: Bones/Joint/Cartilage No acute fracture or dislocation. Mild degenerative changes of the left hip. No joint effusion. Ligaments Suboptimally assessed by CT. Muscles and Tendons No acute findings.  No intramuscular fluid collection or hematoma. Soft tissues No fluid collection or hematoma. Mild stranding of the subcutaneous fat of the lateral left hip. A string of adjacent stones in the left hemipelvis measuring approximately 2.5 cm in length may be vascular calcification but appear to be in the distal left ureter. Additional small stones noted along the left posterior bladder wall. CT of the abdomen pelvis may provide better evaluation. IMPRESSION: 1. No acute fracture or dislocation. 2. Probable string of stones in the distal left ureter as well as stone in the urinary bladder. 3. CT of the abdomen pelvis may provide better evaluation. Electronically Signed   By:  Elgie Collard M.D.   On: 01/03/2023 00:21   CT Head Wo Contrast  Result Date: 01/02/2023 CLINICAL DATA:  Found down on floor, initial encounter EXAM: CT HEAD WITHOUT CONTRAST TECHNIQUE: Contiguous axial images were obtained from the base of the skull through the vertex without intravenous contrast. RADIATION DOSE REDUCTION: This exam was performed according to the departmental dose-optimization program which includes automated exposure control, adjustment of the mA and/or kV according to patient size and/or use of iterative reconstruction technique. COMPARISON:  None Available. FINDINGS: Brain: No evidence of acute infarction, hemorrhage, hydrocephalus, extra-axial collection or mass lesion/mass effect. Mild atrophic changes and chronic white matter ischemic changes noted. Vascular: No hyperdense vessel or unexpected calcification. Skull: Normal. Negative for fracture or focal lesion. Sinuses/Orbits: No acute finding. Chronic appearing mucosal thickening is noted within the left maxillary antrum. Bilateral antrectomies are seen. Other: None. IMPRESSION: Chronic atrophic and ischemic changes without acute abnormality. Changes of chronic sinusitis. Electronically Signed   By: Alcide Clever M.D.   On: 01/02/2023 23:28    DISCHARGE EXAMINATION: Vitals:   01/04/23 2021 01/05/23 0407 01/05/23 0750 01/05/23 1251  BP: (!) 114/48 (!) 123/47 (!) 119/54 (!) 120/45  Pulse: (!) 58 60  (!) 51  Resp: 16 16 16 16   Temp: 98.4 F (36.9 C)  98.6 F (37 C) 98.2 F (36.8 C)  TempSrc: Oral   Oral  SpO2: 97% 99% 100% 99%  Weight:      Height:  General appearance: Awake alert.  In no distress Resp: Clear to auscultation bilaterally.  Normal effort Cardio: S1-S2 is normal regular.  No S3-S4.  No rubs murmurs or bruit GI: Abdomen is soft.  Nontender nondistended.  Bowel sounds are present normal.  No masses organomegaly   DISPOSITION: SNF  Discharge Instructions     Call MD for:  difficulty breathing,  headache or visual disturbances   Complete by: As directed    Call MD for:  extreme fatigue   Complete by: As directed    Call MD for:  persistant dizziness or light-headedness   Complete by: As directed    Call MD for:  persistant nausea and vomiting   Complete by: As directed    Call MD for:  severe uncontrolled pain   Complete by: As directed    Call MD for:  temperature >100.4   Complete by: As directed    Diet - low sodium heart healthy   Complete by: As directed    Discharge instructions   Complete by: As directed    Please review instructions on the discharge summary.  You were cared for by a hospitalist during your hospital stay. If you have any questions about your discharge medications or the care you received while you were in the hospital after you are discharged, you can call the unit and asked to speak with the hospitalist on call if the hospitalist that took care of you is not available. Once you are discharged, your primary care physician will handle any further medical issues. Please note that NO REFILLS for any discharge medications will be authorized once you are discharged, as it is imperative that you return to your primary care physician (or establish a relationship with a primary care physician if you do not have one) for your aftercare needs so that they can reassess your need for medications and monitor your lab values. If you do not have a primary care physician, you can call [phone] for a physician referral.   Increase activity slowly   Complete by: As directed          Allergies as of 01/05/2023       Reactions   Sulfa Antibiotics Other (See Comments)   Sores in mouth        Medication List     STOP taking these medications    OVER THE COUNTER MEDICATION       TAKE these medications    acetaminophen 325 MG tablet Commonly known as: TYLENOL Take 2 tablets (650 mg total) by mouth every 6 (six) hours as needed for mild pain (or Fever >/=  101).   aspirin EC 81 MG tablet Take 81 mg by mouth daily.   digoxin 0.25 MG tablet Commonly known as: LANOXIN Take one tablet by mouth on Mon, Wed and Friday What changed:  how much to take how to take this when to take this additional instructions   escitalopram 20 MG tablet Commonly known as: LEXAPRO Take 20 mg by mouth daily.   levothyroxine 88 MCG tablet Commonly known as: SYNTHROID Take 88 mcg by mouth daily before breakfast.   metFORMIN 500 MG 24 hr tablet Commonly known as: GLUCOPHAGE-XR Take 500 mg by mouth in the morning and at bedtime.   propranolol 20 MG tablet Commonly known as: INDERAL Take 1 tablet (20 mg total) by mouth 4 (four) times daily.   risperiDONE 3 MG tablet Commonly known as: RISPERDAL Take 3 mg by mouth at bedtime. What  changed: Another medication with the same name was removed. Continue taking this medication, and follow the directions you see here.   rosuvastatin 5 MG tablet Commonly known as: CRESTOR Take 2.5 mg by mouth daily.          Follow-up Information     Koirala, Dibas, MD. Schedule an appointment as soon as possible for a visit.   Specialty: Family Medicine Why: post hospitalization follow up Contact information: [address] [place] [address] [phone]         [facility] Follow up.   Why: for kidney stones Contact information: [address] [place] [address] [place] [address] [phone]                TOTAL DISCHARGE TIME: 35 minutes  Traevion Poehler Rito Ehrlich  [facility] Pager on [email or web address]  01/05/2023, 1:19 PM

## 2023-01-05 NOTE — TOC Progression Note (Signed)
Transition of Care [facility]) - Progression Note    Patient Details  Name: Deanna Shea MRN: [id number] Date of Birth: 1945/07/25  [facility] CM/SW Contact  Lorri Frederick, LCSW Phone Number: 01/05/2023, 1:50 PM  Clinical Narrative:   Bed offers provided to pt and to DIL Cheralyn.  They would like to accept offer at [facility].  CSW confirmed bed with Kyra Searles, who can receive pt tomorrow, but not today.  Auth request submitted in [place].      Expected Discharge Plan: Skilled Nursing Facility Barriers to Discharge: Continued Medical Work up, SNF Pending bed offer  Expected Discharge Plan and Services     Post Acute Care Choice: Skilled Nursing Facility Living arrangements for the past 2 months: Apartment Expected Discharge Date: 01/05/23                                     Social Determinants of Health (SDOH) Interventions SDOH Screenings   Food Insecurity: No Food Insecurity (01/03/2023)  Housing: Low Risk  (01/03/2023)  Transportation Needs: No Transportation Needs (01/03/2023)  Utilities: Not At Risk (01/03/2023)  Tobacco Use: Low Risk  (01/04/2023)    Readmission Risk Interventions     No data to display

## 2023-01-05 NOTE — Progress Notes (Signed)
   01/05/23 1032  Mobility  Activity Transferred from bed to chair  Level of Assistance Minimal assist, patient does 75% or more  Assistive Device Front wheel walker  Distance Ambulated (ft) 3 ft  Activity Response Tolerated fair  Mobility Referral Yes  $Mobility charge 1 Mobility  Mobility Specialist Start Time (ACUTE ONLY) 1010  Mobility Specialist Stop Time (ACUTE ONLY) 1027  Mobility Specialist Time Calculation (min) (ACUTE ONLY) 17 min   Mobility Specialist: Progress Note  Pt agreeable to mobility session - received in bed. Pt c/o back pain on 8.5/10 pain scale. Pt returned to chair with all needs met - call bell within reach.   Barnie Mort, BS Mobility Specialist Please contact via SecureChat or Rehab office at [phone].

## 2023-01-06 DIAGNOSIS — T796XXA Traumatic ischemia of muscle, initial encounter: Secondary | ICD-10-CM | POA: Diagnosis not present

## 2023-01-06 LAB — GLUCOSE, CAPILLARY
Glucose-Capillary: 245 mg/dL — ABNORMAL HIGH (ref 70–99)
Glucose-Capillary: 126 mg/dL — ABNORMAL HIGH (ref 70–99)
Glucose-Capillary: 142 mg/dL — ABNORMAL HIGH (ref 70–99)
Glucose-Capillary: 144 mg/dL — ABNORMAL HIGH (ref 70–99)

## 2023-01-06 LAB — CK: Total CK: 157 U/L (ref 38–234)

## 2023-01-06 NOTE — TOC Progression Note (Addendum)
Transition of Care [facility]) - Progression Note    Patient Details  Name: Deanna Shea MRN: [id number] Date of Birth: 1945/10/14  Transition of Care [facility]) CM/SW Contact  Mearl Latin, LCSW Phone Number: 01/06/2023, 8:55 AM  Clinical Narrative:    8:55 AM-Insurance approval still pending for [place].  12:56 PM-Insurance status still pending.    Expected Discharge Plan: Skilled Nursing Facility Barriers to Discharge: Continued Medical Work up, SNF Pending bed offer  Expected Discharge Plan and Services     Post Acute Care Choice: Skilled Nursing Facility Living arrangements for the past 2 months: Apartment Expected Discharge Date: 01/06/23                                     Social Determinants of Health (SDOH) Interventions SDOH Screenings   Food Insecurity: No Food Insecurity (01/03/2023)  Housing: Low Risk  (01/03/2023)  Transportation Needs: No Transportation Needs (01/03/2023)  Utilities: Not At Risk (01/03/2023)  Tobacco Use: Low Risk  (01/04/2023)    Readmission Risk Interventions     No data to display

## 2023-01-06 NOTE — Discharge Summary (Signed)
[facility]  Physician Discharge Summary   Patient ID: Deanna Shea MRN: [id number] DOB/AGE: 1946/01/02 77 y.o.  Admit date: 01/02/2023 Discharge date:   01/06/2023   PCP: Darrow Bussing, MD  DISCHARGE DIAGNOSES:    Rhabdomyolysis Nephrolithiasis Paroxysmal atrial fibrillation Hyperlipidemia History of anxiety depression and schizophrenia Hypothyroidism Diabetes mellitus type 2 Incidental cholelithiasis   RECOMMENDATIONS FOR OUTPATIENT FOLLOW UP: Please check CBC and basic metabolic panel in 1 week Consider non-urgent referral to urology for nephrolithiasis Consider referral to GI for rectal wall thickening noted on CT.   Home Health: Going to SNF Equipment/Devices: None  CODE STATUS: Full code  DISCHARGE CONDITION: fair  Diet recommendation: Heart healthy  INITIAL HISTORY: 77 y.o. female with a known history of anxiety, depression, atrial fibrillation on aspirin, diabetes, mitral valve prolapse, schizophrenia presented to the emergency department for evaluation of fall.  Patient was in a usual state of health until 3 days prior to this admission when she fell in her home, states that it was a mechanical fall but she has been unable to get up due to weakness and pain.  Her daughter called for help when she could not get in touch with patient.  She was brought into the emergency department.  Was found to have rhabdomyolysis and was hospitalized.    HOSPITAL COURSE:   Rhabdomyolysis Secondary to mechanical fall.  CK level was 2675.  Patient was started on IV fluids.  Reason for her fall is not entirely clear.  She denies any syncopal episode.  She mentions that she stumbled on something and fell but could not get up.  No injuries identified on imaging studies or examination.   CK level has significantly improved.  Renal function is normal this morning.  Okay to discontinue IV fluids. Seen by PT and OT.  Skilled nursing facility is recommended for short-term  rehab.   Paroxysmal atrial fibrillation Noted to be on aspirin digoxin and propranolol.  Digoxin level is 0.4.   Nephrolithiasis/ureterolithiasis Incidentally noted to have kidney stones and also a stone in the mid left ureter.  No hydronephrosis noted.  Patient currently asymptomatic.   CT renal stone study was done on 8/30 which did not show any obstruction.  No ureteral lithiasis was noted. Discussed with patient's daughter-in-law who mentioned that patient has a known history of nephrolithiasis and passes stones on a regular basis.  She does not know if she has been seen by urology but she will find out.   Hyperlipidemia Okay to resume statin   History of anxiety depression and schizophrenia Continue Lexapro and Risperdal.   History of hypothyroidism Continue levothyroxine.   History of diabetes mellitus type 2 Okay to resume home medications   Incidental cholelithiasis Asymptomatic.  LFTs unremarkable.  Rectal wall thickening CT scan did raise concern for thickened rectal wall.  Patient not having diarrhea.  She mentioned that she was having diarrhea a few days ago.  Could be a sequelae of that.  No further inpatient workup.  May need to be addressed in the outpatient setting.  Patient is stable.  Okay for discharge to SNF when bed is available.    PERTINENT LABS:  The results of significant diagnostics from this hospitalization (including imaging, microbiology, ancillary and laboratory) are listed below for reference.     Labs:   Basic Metabolic Panel: Recent Labs  Lab 01/02/23 2218 01/03/23 1423 01/04/23 0539 01/05/23 0652  NA 144 139 141 138  K 3.6 3.6 3.5 3.6  CL 102 101 103  103  CO2 27 24 25 28   GLUCOSE 205* 143* 117* 127*  BUN 46* 32* 22 16  CREATININE 0.98 0.66 1.03* 0.69  CALCIUM 9.9 8.2* 8.0* 8.3*  MG 2.3  --   --  1.9   Liver Function Tests: Recent Labs  Lab 01/04/23 0539  AST 45*  ALT 37  ALKPHOS 55  BILITOT 1.1  PROT 5.6*  ALBUMIN 2.5*     CBC: Recent Labs  Lab 01/02/23 2218 01/04/23 0539  WBC 10.7* 7.2  NEUTROABS 9.1*  --   HGB 15.6* 12.9  HCT 47.6* 40.1  MCV 91.2 91.1  PLT 326 245   Cardiac Enzymes: Recent Labs  Lab 01/02/23 2218 01/03/23 1423 01/04/23 0539 01/05/23 0652 01/06/23 0250  CKTOTAL 2,675* 1,742* 972* 381* 157     CBG: Recent Labs  Lab 01/04/23 2156 01/05/23 0748 01/05/23 1132 01/05/23 1609 01/05/23 2013  GLUCAP 122* 166* 108* 165* 158*     IMAGING STUDIES CT RENAL STONE STUDY  Result Date: 01/05/2023 CLINICAL DATA:  Urolithiasis * Tracking Code: BO * EXAM: CT ABDOMEN AND PELVIS WITHOUT CONTRAST TECHNIQUE: Multidetector CT imaging of the abdomen and pelvis was performed following the standard protocol without IV contrast. RADIATION DOSE REDUCTION: This exam was performed according to the departmental dose-optimization program which includes automated exposure control, adjustment of the mA and/or kV according to patient size and/or use of iterative reconstruction technique. COMPARISON:  01/27/2014 FINDINGS: Lower chest: No acute abnormality. Hepatobiliary: No solid liver abnormality is seen. Gallstones. No gallbladder wall thickening, or biliary dilatation. Pancreas: Unremarkable. No pancreatic ductal dilatation or surrounding inflammatory changes. Spleen: Normal in size without significant abnormality. Adrenals/Urinary Tract: Adrenal glands are unremarkable. Multiple bilateral nonobstructive renal calculi as well as medullary nephrocalcinosis. Bladder is unremarkable. Stomach/Bowel: Stomach is within normal limits. Status post appendectomy. Distal small bowel resection and reanastomosis in the right lower quadrant. Circumferential wall thickening of the rectum with adjacent perirectal and presacral fat stranding (series 3, image 74). Vascular/Lymphatic: Aortic atherosclerosis. Multiple small phleboliths in the pelvis, several of which are adjacent to the ureters, however are not favored to  reflect urinary tract calculi. No enlarged abdominal or pelvic lymph nodes. Reproductive: No mass or other significant abnormality. Other: No abdominal wall hernia or abnormality. No ascites. Musculoskeletal: No acute or significant osseous findings. IMPRESSION: 1. Circumferential wall thickening of the rectum with adjacent perirectal and presacral fat stranding, consistent with proctitis although this appearance is worrisome for an underlying rectal malignancy. 2. Multiple bilateral nonobstructive renal calculi as well as medullary nephrocalcinosis. No hydronephrosis. 3. Multiple small phleboliths in the pelvis, several of which are adjacent to the ureters, however not favored to reflect urinary tract calculi. These could be more confidently excluded by contrast enhanced CT urogram including urographic delayed phase imaging if desired. 4. Cholelithiasis. Aortic Atherosclerosis (ICD10-I70.0). Electronically Signed   By: Jearld Lesch M.D.   On: 01/05/2023 13:09   CT Lumbar Spine Wo Contrast  Result Date: 01/03/2023 CLINICAL DATA:  Found down EXAM: CT LUMBAR SPINE WITHOUT CONTRAST TECHNIQUE: Multidetector CT imaging of the lumbar spine was performed without intravenous contrast administration. Multiplanar CT image reconstructions were also generated. RADIATION DOSE REDUCTION: This exam was performed according to the departmental dose-optimization program which includes automated exposure control, adjustment of the mA and/or kV according to patient size and/or use of iterative reconstruction technique. COMPARISON:  None Available. FINDINGS: Segmentation: 5 lumbar type vertebrae. Alignment: Normal. Vertebrae: No acute fracture or focal pathologic process. Paraspinal and other soft tissues: Cholelithiasis.  There are multiple bilateral renal calculi. There is a 3 mm calculus in the mid left ureter. No hydronephrosis. Disc levels: No spinal canal stenosis. Degenerative disc disease is greatest at L5-S1. Mild right L5  foraminal stenosis. IMPRESSION: 1. No acute fracture or traumatic malalignment of the lumbar spine. 2. A 3 mm calculus in the mid left ureter. No hydronephrosis. 3. Cholelithiasis and nephrolithiasis. Electronically Signed   By: Deatra Robinson M.D.   On: 01/03/2023 00:21   CT Hip Left Wo Contrast  Result Date: 01/03/2023 CLINICAL DATA:  Trauma to the left hip. Pain. Concern for fracture. EXAM: CT OF THE LEFT HIP WITHOUT CONTRAST TECHNIQUE: Multidetector CT imaging of the left hip was performed according to the standard protocol. Multiplanar CT image reconstructions were also generated. RADIATION DOSE REDUCTION: This exam was performed according to the departmental dose-optimization program which includes automated exposure control, adjustment of the mA and/or kV according to patient size and/or use of iterative reconstruction technique. COMPARISON:  CT abdomen pelvis dated 01/27/2014. FINDINGS: Bones/Joint/Cartilage No acute fracture or dislocation. Mild degenerative changes of the left hip. No joint effusion. Ligaments Suboptimally assessed by CT. Muscles and Tendons No acute findings.  No intramuscular fluid collection or hematoma. Soft tissues No fluid collection or hematoma. Mild stranding of the subcutaneous fat of the lateral left hip. A string of adjacent stones in the left hemipelvis measuring approximately 2.5 cm in length may be vascular calcification but appear to be in the distal left ureter. Additional small stones noted along the left posterior bladder wall. CT of the abdomen pelvis may provide better evaluation. IMPRESSION: 1. No acute fracture or dislocation. 2. Probable string of stones in the distal left ureter as well as stone in the urinary bladder. 3. CT of the abdomen pelvis may provide better evaluation. Electronically Signed   By: Elgie Collard M.D.   On: 01/03/2023 00:21   CT Head Wo Contrast  Result Date: 01/02/2023 CLINICAL DATA:  Found down on floor, initial encounter EXAM: CT  HEAD WITHOUT CONTRAST TECHNIQUE: Contiguous axial images were obtained from the base of the skull through the vertex without intravenous contrast. RADIATION DOSE REDUCTION: This exam was performed according to the departmental dose-optimization program which includes automated exposure control, adjustment of the mA and/or kV according to patient size and/or use of iterative reconstruction technique. COMPARISON:  None Available. FINDINGS: Brain: No evidence of acute infarction, hemorrhage, hydrocephalus, extra-axial collection or mass lesion/mass effect. Mild atrophic changes and chronic white matter ischemic changes noted. Vascular: No hyperdense vessel or unexpected calcification. Skull: Normal. Negative for fracture or focal lesion. Sinuses/Orbits: No acute finding. Chronic appearing mucosal thickening is noted within the left maxillary antrum. Bilateral antrectomies are seen. Other: None. IMPRESSION: Chronic atrophic and ischemic changes without acute abnormality. Changes of chronic sinusitis. Electronically Signed   By: Alcide Clever M.D.   On: 01/02/2023 23:28    DISCHARGE EXAMINATION: Vitals:   01/05/23 0750 01/05/23 1251 01/05/23 2004 01/06/23 0546  BP: (!) 119/54 (!) 120/45 (!) 120/45 133/68  Pulse:  (!) 51 (!) 56 61  Resp: 16 16 18 18   Temp: 98.6 F (37 C) 98.2 F (36.8 C) 98.3 F (36.8 C) 98.4 F (36.9 C)  TempSrc:  Oral Oral Oral  SpO2: 100% 99% 95% 98%  Weight:      Height:       General appearance: Awake alert.  In no distress Resp: Clear to auscultation bilaterally.  Normal effort Cardio: S1-S2 is normal regular.  No S3-S4.  No rubs murmurs or bruit GI: Abdomen is soft.  Nontender nondistended.  Bowel sounds are present normal.  No masses organomegaly    DISPOSITION: SNF  Discharge Instructions     Call MD for:  difficulty breathing, headache or visual disturbances   Complete by: As directed    Call MD for:  extreme fatigue   Complete by: As directed    Call MD for:   persistant dizziness or light-headedness   Complete by: As directed    Call MD for:  persistant nausea and vomiting   Complete by: As directed    Call MD for:  severe uncontrolled pain   Complete by: As directed    Call MD for:  temperature >100.4   Complete by: As directed    Diet - low sodium heart healthy   Complete by: As directed    Discharge instructions   Complete by: As directed    Please review instructions on the discharge summary.  You were cared for by a hospitalist during your hospital stay. If you have any questions about your discharge medications or the care you received while you were in the hospital after you are discharged, you can call the unit and asked to speak with the hospitalist on call if the hospitalist that took care of you is not available. Once you are discharged, your primary care physician will handle any further medical issues. Please note that NO REFILLS for any discharge medications will be authorized once you are discharged, as it is imperative that you return to your primary care physician (or establish a relationship with a primary care physician if you do not have one) for your aftercare needs so that they can reassess your need for medications and monitor your lab values. If you do not have a primary care physician, you can call [phone] for a physician referral.   Increase activity slowly   Complete by: As directed          Allergies as of 01/06/2023       Reactions   Sulfa Antibiotics Other (See Comments)   Sores in mouth        Medication List     STOP taking these medications    OVER THE COUNTER MEDICATION       TAKE these medications    acetaminophen 325 MG tablet Commonly known as: TYLENOL Take 2 tablets (650 mg total) by mouth every 6 (six) hours as needed for mild pain (or Fever >/= 101).   aspirin EC 81 MG tablet Take 81 mg by mouth daily.   digoxin 0.25 MG tablet Commonly known as: LANOXIN Take one tablet by mouth  on Mon, Wed and Friday What changed:  how much to take how to take this when to take this additional instructions   escitalopram 20 MG tablet Commonly known as: LEXAPRO Take 20 mg by mouth daily.   levothyroxine 88 MCG tablet Commonly known as: SYNTHROID Take 88 mcg by mouth daily before breakfast.   metFORMIN 500 MG 24 hr tablet Commonly known as: GLUCOPHAGE-XR Take 500 mg by mouth in the morning and at bedtime.   propranolol 20 MG tablet Commonly known as: INDERAL Take 1 tablet (20 mg total) by mouth 4 (four) times daily.   risperiDONE 3 MG tablet Commonly known as: RISPERDAL Take 3 mg by mouth at bedtime. What changed: Another medication with the same name was removed. Continue taking this medication, and follow the directions you see here.   rosuvastatin 5  MG tablet Commonly known as: CRESTOR Take 2.5 mg by mouth daily.          Follow-up Information     Koirala, Dibas, MD. Schedule an appointment as soon as possible for a visit.   Specialty: Family Medicine Why: post hospitalization follow up Contact information: [address] [place] [address] [phone]         [facility] Follow up.   Why: for kidney stones Contact information: [address] [place] [address] [place] [address] [phone]                TOTAL DISCHARGE TIME: 35 minutes  Brie Eppard Rito Ehrlich  [facility] Pager on [email or web address]  01/06/2023, 8:39 AM

## 2023-01-07 LAB — GLUCOSE, CAPILLARY
Glucose-Capillary: 209 mg/dL — ABNORMAL HIGH (ref 70–99)
Glucose-Capillary: 133 mg/dL — ABNORMAL HIGH (ref 70–99)
Glucose-Capillary: 125 mg/dL — ABNORMAL HIGH (ref 70–99)
Glucose-Capillary: 114 mg/dL — ABNORMAL HIGH (ref 70–99)

## 2023-01-07 NOTE — Progress Notes (Signed)
Patient feels well.  No new complaints offered.  Denies any diarrhea.  No abdominal pain.  No nausea vomiting.  Vital signs noted to be stable.  Lungs are clear to auscultation bilaterally. Abdomen is soft.  Mildly elevated CBGs noted.  Patient seems to be stable.  Recovered from rhabdomyolysis.  Waiting on skilled nursing facility placement for short-term rehab.  Please see discharge summary from yesterday for further details.  This is a no charge note.  Osvaldo Shipper 01/07/2023

## 2023-01-08 LAB — GLUCOSE, CAPILLARY
Glucose-Capillary: 199 mg/dL — ABNORMAL HIGH (ref 70–99)
Glucose-Capillary: 110 mg/dL — ABNORMAL HIGH (ref 70–99)
Glucose-Capillary: 172 mg/dL — ABNORMAL HIGH (ref 70–99)
Glucose-Capillary: 126 mg/dL — ABNORMAL HIGH (ref 70–99)

## 2023-01-08 NOTE — Discharge Summary (Signed)
[facility]  Physician Discharge Summary   Patient ID: Deanna Shea MRN: [id number] DOB/AGE: Jun 22, 1945 77 y.o.  Admit date: 01/02/2023 Discharge date:   01/08/2023   PCP: Darrow Bussing, MD  DISCHARGE DIAGNOSES:    Rhabdomyolysis Nephrolithiasis Paroxysmal atrial fibrillation Hyperlipidemia History of anxiety depression and schizophrenia Hypothyroidism Diabetes mellitus type 2 Incidental cholelithiasis   RECOMMENDATIONS FOR OUTPATIENT FOLLOW UP: Please check CBC and basic metabolic panel in 1 week Consider non-urgent referral to urology for nephrolithiasis Consider referral to GI for rectal wall thickening noted on CT. Patient previously seen by Dr. Dulce Sellar with [facility] gastroenterology.   Home Health: Going to SNF Equipment/Devices: None  CODE STATUS: Full code  DISCHARGE CONDITION: fair  Diet recommendation: Heart healthy  INITIAL HISTORY: 77 y.o. female with a known history of anxiety, depression, atrial fibrillation on aspirin, diabetes, mitral valve prolapse, schizophrenia presented to the emergency department for evaluation of fall.  Patient was in a usual state of health until 3 days prior to this admission when she fell in her home, states that it was a mechanical fall but she has been unable to get up due to weakness and pain.  Her daughter called for help when she could not get in touch with patient.  She was brought into the emergency department.  Was found to have rhabdomyolysis and was hospitalized.    HOSPITAL COURSE:   Rhabdomyolysis Secondary to mechanical fall.  CK level was 2675.  Patient was started on IV fluids.  Reason for her fall is not entirely clear.  She denies any syncopal episode.  She mentions that she stumbled on something and fell but could not get up.  No injuries identified on imaging studies or examination.   CK level has improved.  Renal function was back to normal.  IV fluids were discontinued. Seen by PT and OT.  Skilled  nursing facility is recommended for short-term rehab.   Paroxysmal atrial fibrillation Noted to be on aspirin digoxin and propranolol.  Digoxin level is 0.4.   Nephrolithiasis/ureterolithiasis Incidentally noted to have kidney stones and also a stone in the mid left ureter.  No hydronephrosis noted.  Patient currently asymptomatic.   CT renal stone study was done on 8/30 which did not show any obstruction.  No ureteral lithiasis was noted. Discussed with patient's daughter-in-law who mentioned that patient has a known history of nephrolithiasis and passes stones on a regular basis.  Patient does not follow-up with urology on a regular basis.   Hyperlipidemia Okay to resume statin   History of anxiety depression and schizophrenia Continue Lexapro and Risperdal.   History of hypothyroidism Continue levothyroxine.   History of diabetes mellitus type 2 Okay to resume home medications   Incidental cholelithiasis Asymptomatic.  LFTs unremarkable.  Rectal wall thickening CT scan did raise concern for thickened rectal wall.  Patient not having diarrhea.  She mentioned that she was having diarrhea a few days ago.  Could be a sequelae of that.  No further inpatient workup.  She has had colonoscopy about 8 years ago by Dr. Dulce Sellar with [facility] gastroenterology.  Would recommend referral to GI once she has completed rehabilitation to consider repeat colonoscopy or flexible sigmoidoscopy.  Patient is stable.  Okay for discharge to SNF when bed is available.    PERTINENT LABS:  The results of significant diagnostics from this hospitalization (including imaging, microbiology, ancillary and laboratory) are listed below for reference.     Labs:   Basic Metabolic Panel: Recent Labs  Lab 01/02/23  2218 01/03/23 1423 01/04/23 0539 01/05/23 0652  NA 144 139 141 138  K 3.6 3.6 3.5 3.6  CL 102 101 103 103  CO2 27 24 25 28   GLUCOSE 205* 143* 117* 127*  BUN 46* 32* 22 16  CREATININE 0.98 0.66  1.03* 0.69  CALCIUM 9.9 8.2* 8.0* 8.3*  MG 2.3  --   --  1.9   Liver Function Tests: Recent Labs  Lab 01/04/23 0539  AST 45*  ALT 37  ALKPHOS 55  BILITOT 1.1  PROT 5.6*  ALBUMIN 2.5*    CBC: Recent Labs  Lab 01/02/23 2218 01/04/23 0539  WBC 10.7* 7.2  NEUTROABS 9.1*  --   HGB 15.6* 12.9  HCT 47.6* 40.1  MCV 91.2 91.1  PLT 326 245   Cardiac Enzymes: Recent Labs  Lab 01/02/23 2218 01/03/23 1423 01/04/23 0539 01/05/23 0652 01/06/23 0250  CKTOTAL 2,675* 1,742* 972* 381* 157     CBG: Recent Labs  Lab 01/07/23 0759 01/07/23 1127 01/07/23 1614 01/07/23 2018 01/08/23 0755  GLUCAP 209* 133* 125* 114* 199*     IMAGING STUDIES CT RENAL STONE STUDY  Result Date: 01/05/2023 CLINICAL DATA:  Urolithiasis * Tracking Code: BO * EXAM: CT ABDOMEN AND PELVIS WITHOUT CONTRAST TECHNIQUE: Multidetector CT imaging of the abdomen and pelvis was performed following the standard protocol without IV contrast. RADIATION DOSE REDUCTION: This exam was performed according to the departmental dose-optimization program which includes automated exposure control, adjustment of the mA and/or kV according to patient size and/or use of iterative reconstruction technique. COMPARISON:  01/27/2014 FINDINGS: Lower chest: No acute abnormality. Hepatobiliary: No solid liver abnormality is seen. Gallstones. No gallbladder wall thickening, or biliary dilatation. Pancreas: Unremarkable. No pancreatic ductal dilatation or surrounding inflammatory changes. Spleen: Normal in size without significant abnormality. Adrenals/Urinary Tract: Adrenal glands are unremarkable. Multiple bilateral nonobstructive renal calculi as well as medullary nephrocalcinosis. Bladder is unremarkable. Stomach/Bowel: Stomach is within normal limits. Status post appendectomy. Distal small bowel resection and reanastomosis in the right lower quadrant. Circumferential wall thickening of the rectum with adjacent perirectal and presacral fat  stranding (series 3, image 74). Vascular/Lymphatic: Aortic atherosclerosis. Multiple small phleboliths in the pelvis, several of which are adjacent to the ureters, however are not favored to reflect urinary tract calculi. No enlarged abdominal or pelvic lymph nodes. Reproductive: No mass or other significant abnormality. Other: No abdominal wall hernia or abnormality. No ascites. Musculoskeletal: No acute or significant osseous findings. IMPRESSION: 1. Circumferential wall thickening of the rectum with adjacent perirectal and presacral fat stranding, consistent with proctitis although this appearance is worrisome for an underlying rectal malignancy. 2. Multiple bilateral nonobstructive renal calculi as well as medullary nephrocalcinosis. No hydronephrosis. 3. Multiple small phleboliths in the pelvis, several of which are adjacent to the ureters, however not favored to reflect urinary tract calculi. These could be more confidently excluded by contrast enhanced CT urogram including urographic delayed phase imaging if desired. 4. Cholelithiasis. Aortic Atherosclerosis (ICD10-I70.0). Electronically Signed   By: Jearld Lesch M.D.   On: 01/05/2023 13:09   CT Lumbar Spine Wo Contrast  Result Date: 01/03/2023 CLINICAL DATA:  Found down EXAM: CT LUMBAR SPINE WITHOUT CONTRAST TECHNIQUE: Multidetector CT imaging of the lumbar spine was performed without intravenous contrast administration. Multiplanar CT image reconstructions were also generated. RADIATION DOSE REDUCTION: This exam was performed according to the departmental dose-optimization program which includes automated exposure control, adjustment of the mA and/or kV according to patient size and/or use of iterative reconstruction technique. COMPARISON:  None Available. FINDINGS: Segmentation: 5 lumbar type vertebrae. Alignment: Normal. Vertebrae: No acute fracture or focal pathologic process. Paraspinal and other soft tissues: Cholelithiasis. There are multiple  bilateral renal calculi. There is a 3 mm calculus in the mid left ureter. No hydronephrosis. Disc levels: No spinal canal stenosis. Degenerative disc disease is greatest at L5-S1. Mild right L5 foraminal stenosis. IMPRESSION: 1. No acute fracture or traumatic malalignment of the lumbar spine. 2. A 3 mm calculus in the mid left ureter. No hydronephrosis. 3. Cholelithiasis and nephrolithiasis. Electronically Signed   By: Deatra Robinson M.D.   On: 01/03/2023 00:21   CT Hip Left Wo Contrast  Result Date: 01/03/2023 CLINICAL DATA:  Trauma to the left hip. Pain. Concern for fracture. EXAM: CT OF THE LEFT HIP WITHOUT CONTRAST TECHNIQUE: Multidetector CT imaging of the left hip was performed according to the standard protocol. Multiplanar CT image reconstructions were also generated. RADIATION DOSE REDUCTION: This exam was performed according to the departmental dose-optimization program which includes automated exposure control, adjustment of the mA and/or kV according to patient size and/or use of iterative reconstruction technique. COMPARISON:  CT abdomen pelvis dated 01/27/2014. FINDINGS: Bones/Joint/Cartilage No acute fracture or dislocation. Mild degenerative changes of the left hip. No joint effusion. Ligaments Suboptimally assessed by CT. Muscles and Tendons No acute findings.  No intramuscular fluid collection or hematoma. Soft tissues No fluid collection or hematoma. Mild stranding of the subcutaneous fat of the lateral left hip. A string of adjacent stones in the left hemipelvis measuring approximately 2.5 cm in length may be vascular calcification but appear to be in the distal left ureter. Additional small stones noted along the left posterior bladder wall. CT of the abdomen pelvis may provide better evaluation. IMPRESSION: 1. No acute fracture or dislocation. 2. Probable string of stones in the distal left ureter as well as stone in the urinary bladder. 3. CT of the abdomen pelvis may provide better  evaluation. Electronically Signed   By: Elgie Collard M.D.   On: 01/03/2023 00:21   CT Head Wo Contrast  Result Date: 01/02/2023 CLINICAL DATA:  Found down on floor, initial encounter EXAM: CT HEAD WITHOUT CONTRAST TECHNIQUE: Contiguous axial images were obtained from the base of the skull through the vertex without intravenous contrast. RADIATION DOSE REDUCTION: This exam was performed according to the departmental dose-optimization program which includes automated exposure control, adjustment of the mA and/or kV according to patient size and/or use of iterative reconstruction technique. COMPARISON:  None Available. FINDINGS: Brain: No evidence of acute infarction, hemorrhage, hydrocephalus, extra-axial collection or mass lesion/mass effect. Mild atrophic changes and chronic white matter ischemic changes noted. Vascular: No hyperdense vessel or unexpected calcification. Skull: Normal. Negative for fracture or focal lesion. Sinuses/Orbits: No acute finding. Chronic appearing mucosal thickening is noted within the left maxillary antrum. Bilateral antrectomies are seen. Other: None. IMPRESSION: Chronic atrophic and ischemic changes without acute abnormality. Changes of chronic sinusitis. Electronically Signed   By: Alcide Clever M.D.   On: 01/02/2023 23:28    DISCHARGE EXAMINATION: Vitals:   01/07/23 2017 01/08/23 0439 01/08/23 0757 01/08/23 0830  BP: (!) 109/46 (!) 126/49 (!) 110/41   Pulse: 60 60 71 63  Resp: 17 16 18    Temp: 98.4 F (36.9 C) 97.9 F (36.6 C) 98 F (36.7 C)   TempSrc:  Oral Oral   SpO2: 98% 96% 99%   Weight:      Height:       General appearance: Awake alert.  In  no distress Resp: Clear to auscultation bilaterally.  Normal effort Cardio: S1-S2 is normal regular.  No S3-S4.  No rubs murmurs or bruit GI: Abdomen is soft.  Nontender nondistended.  Bowel sounds are present normal.  No masses organomegaly   DISPOSITION: SNF  Discharge Instructions     Call MD for:   difficulty breathing, headache or visual disturbances   Complete by: As directed    Call MD for:  extreme fatigue   Complete by: As directed    Call MD for:  persistant dizziness or light-headedness   Complete by: As directed    Call MD for:  persistant nausea and vomiting   Complete by: As directed    Call MD for:  severe uncontrolled pain   Complete by: As directed    Call MD for:  temperature >100.4   Complete by: As directed    Diet - low sodium heart healthy   Complete by: As directed    Discharge instructions   Complete by: As directed    Please review instructions on the discharge summary.  You were cared for by a hospitalist during your hospital stay. If you have any questions about your discharge medications or the care you received while you were in the hospital after you are discharged, you can call the unit and asked to speak with the hospitalist on call if the hospitalist that took care of you is not available. Once you are discharged, your primary care physician will handle any further medical issues. Please note that NO REFILLS for any discharge medications will be authorized once you are discharged, as it is imperative that you return to your primary care physician (or establish a relationship with a primary care physician if you do not have one) for your aftercare needs so that they can reassess your need for medications and monitor your lab values. If you do not have a primary care physician, you can call [phone] for a physician referral.   Increase activity slowly   Complete by: As directed          Allergies as of 01/08/2023       Reactions   Sulfa Antibiotics Other (See Comments)   Sores in mouth        Medication List     STOP taking these medications    OVER THE COUNTER MEDICATION       TAKE these medications    acetaminophen 325 MG tablet Commonly known as: TYLENOL Take 2 tablets (650 mg total) by mouth every 6 (six) hours as needed for mild  pain (or Fever >/= 101).   aspirin EC 81 MG tablet Take 81 mg by mouth daily.   digoxin 0.25 MG tablet Commonly known as: LANOXIN Take one tablet by mouth on Mon, Wed and Friday What changed:  how much to take how to take this when to take this additional instructions   escitalopram 20 MG tablet Commonly known as: LEXAPRO Take 20 mg by mouth daily.   levothyroxine 88 MCG tablet Commonly known as: SYNTHROID Take 88 mcg by mouth daily before breakfast.   metFORMIN 500 MG 24 hr tablet Commonly known as: GLUCOPHAGE-XR Take 500 mg by mouth in the morning and at bedtime.   propranolol 20 MG tablet Commonly known as: INDERAL Take 1 tablet (20 mg total) by mouth 4 (four) times daily.   risperiDONE 3 MG tablet Commonly known as: RISPERDAL Take 3 mg by mouth at bedtime. What changed: Another medication with the same  name was removed. Continue taking this medication, and follow the directions you see here.   rosuvastatin 5 MG tablet Commonly known as: CRESTOR Take 2.5 mg by mouth daily.          Follow-up Information     Koirala, Dibas, MD. Schedule an appointment as soon as possible for a visit.   Specialty: Family Medicine Why: post hospitalization follow up Contact information: [address] [place] [address] [phone]         [facility] Follow up.   Why: for kidney stones Contact information: [address] [place] [place] [address] [phone]                TOTAL DISCHARGE TIME: 35 minutes  Shewanda Sharpe Rito Ehrlich  [facility] Pager on [email or web address]  01/08/2023, 9:10 AM

## 2023-01-08 NOTE — Progress Notes (Signed)
Physical Therapy Treatment Patient Details Name: Deanna Shea MRN: [id number] DOB: [birth year] Today's Date: 01/08/2023   History of Present Illness The pt is a 77 yo female presenting 8/27 after a mechanical fall at home. Pt reports she fell on 8/25 and was unable to get up on her own or call for help, family checked on her 8/27 where she was found down. Pt found to have rhabdomyolysis. PMH includes: anxiety, depression, atrial fibrillation on aspirin, diabetes, mitral valve prolapse, schizophrenia    PT Comments  Pt tolerated treatment well today. Pt only agreeable to transfer to chair today which pt was able to perform with CGA however still requires Min A for sit to stands. No change in DC/DME recs at this time. Pt anticipates DC to SNF tomorrow.   If plan is discharge home, recommend the following: A lot of help with walking and/or transfers;A lot of help with bathing/dressing/bathroom;Assistance with cooking/housework;Direct supervision/assist for medications management;Direct supervision/assist for financial management;Assist for transportation;Help with stairs or ramp for entrance;Supervision due to cognitive status   Can travel by private vehicle     Yes  Equipment Recommendations  Rolling walker (2 wheels);BSC/3in1    Recommendations for Other Services       Precautions / Restrictions Precautions Precautions: Fall Restrictions Weight Bearing Restrictions: No     Mobility  Bed Mobility Overal bed mobility: Needs Assistance Bed Mobility: Supine to Sit     Supine to sit: Contact guard          Transfers Overall transfer level: Needs assistance Equipment used: Rolling walker (2 wheels) Transfers: Sit to/from Stand, Bed to chair/wheelchair/BSC Sit to Stand: Min assist           General transfer comment: Cues for hand placement. Pt only agreeable to chair transfer. Pt able to perform with CGA while holding RW with one hand and purewick with other hand.     Ambulation/Gait               General Gait Details: Pt declined   Stairs             Wheelchair Mobility     Tilt Bed    Modified Rankin (Stroke Patients Only)       Balance Overall balance assessment: Needs assistance Sitting-balance support: No upper extremity supported Sitting balance-Leahy Scale: Fair     Standing balance support: No upper extremity supported, Bilateral upper extremity supported Standing balance-Leahy Scale: Fair                              Cognition Arousal: Alert Behavior During Therapy: WFL for tasks assessed/performed Overall Cognitive Status: No family/caregiver present to determine baseline cognitive functioning                                 General Comments: pt slow to answer questions and with flat affect, following all commands well and A&Ox4.        Exercises      General Comments General comments (skin integrity, edema, etc.): VSS      Pertinent Vitals/Pain Pain Assessment Pain Assessment: No/denies pain    Home Living                          Prior Function            PT Goals (current goals can  now be found in the care plan section) Progress towards PT goals: Progressing toward goals    Frequency    Min 1X/week      PT Plan      Co-evaluation              AM-PAC PT "6 Clicks" Mobility   Outcome Measure  Help needed turning from your back to your side while in a flat bed without using bedrails?: None Help needed moving from lying on your back to sitting on the side of a flat bed without using bedrails?: A Little Help needed moving to and from a bed to a chair (including a wheelchair)?: A Little Help needed standing up from a chair using your arms (e.g., wheelchair or bedside chair)?: A Little Help needed to walk in hospital room?: A Little Help needed climbing 3-5 steps with a railing? : A Lot 6 Click Score: 18    End of Session Equipment  Utilized During Treatment: Gait belt Activity Tolerance: Patient tolerated treatment well Patient left: with call bell/phone within reach;in chair Nurse Communication: Mobility status PT Visit Diagnosis: Unsteadiness on feet (R26.81);Other abnormalities of gait and mobility (R26.89)     Time: 1610-9604 PT Time Calculation (min) (ACUTE ONLY): 8 min  Charges:    $Therapeutic Activity: 8-22 mins PT General Charges $$ ACUTE PT VISIT: 1 Visit                     Shela Nevin, PT, DPT Acute Rehab Services [phone]    [name] 01/08/2023, 3:22 PM

## 2023-01-08 NOTE — Plan of Care (Signed)

## 2023-01-08 NOTE — TOC Progression Note (Addendum)
Transition of Care [facility]) - Progression Note    Patient Details  Name: Deanna Shea MRN: [id number] Date of Birth: 12-23-1945  Transition of Care [facility]) CM/SW Contact  Lorri Frederick, LCSW Phone Number: 01/08/2023, 9:36 AM  Clinical Narrative:   SNF auth approved in [place]: [id number], [id number], 4 days: 8/31-9/3.    Waiting on confirmation from [facility] that they can receive pt today.   52: [facility] cannot accept pt today due to holiday staffing.  MD updated.  DIL Cheralyn updated by voicemail.   Expected Discharge Plan: Skilled Nursing Facility Barriers to Discharge: Continued Medical Work up, SNF Pending bed offer  Expected Discharge Plan and Services     Post Acute Care Choice: Skilled Nursing Facility Living arrangements for the past 2 months: Apartment Expected Discharge Date: 01/06/23                                     Social Determinants of Health (SDOH) Interventions SDOH Screenings   Food Insecurity: No Food Insecurity (01/03/2023)  Housing: Low Risk  (01/03/2023)  Transportation Needs: No Transportation Needs (01/03/2023)  Utilities: Not At Risk (01/03/2023)  Tobacco Use: Low Risk  (01/04/2023)    Readmission Risk Interventions     No data to display

## 2023-01-09 DIAGNOSIS — K644 Residual hemorrhoidal skin tags: Secondary | ICD-10-CM | POA: Diagnosis not present

## 2023-01-09 DIAGNOSIS — N200 Calculus of kidney: Secondary | ICD-10-CM | POA: Diagnosis not present

## 2023-01-09 DIAGNOSIS — S71102A Unspecified open wound, left thigh, initial encounter: Secondary | ICD-10-CM | POA: Diagnosis not present

## 2023-01-09 DIAGNOSIS — E039 Hypothyroidism, unspecified: Secondary | ICD-10-CM | POA: Diagnosis not present

## 2023-01-09 DIAGNOSIS — K639 Disease of intestine, unspecified: Secondary | ICD-10-CM | POA: Diagnosis not present

## 2023-01-09 DIAGNOSIS — D649 Anemia, unspecified: Secondary | ICD-10-CM | POA: Diagnosis not present

## 2023-01-09 DIAGNOSIS — Z7984 Long term (current) use of oral hypoglycemic drugs: Secondary | ICD-10-CM | POA: Diagnosis not present

## 2023-01-09 DIAGNOSIS — F411 Generalized anxiety disorder: Secondary | ICD-10-CM | POA: Diagnosis not present

## 2023-01-09 DIAGNOSIS — R2681 Unsteadiness on feet: Secondary | ICD-10-CM | POA: Diagnosis not present

## 2023-01-09 DIAGNOSIS — M6282 Rhabdomyolysis: Secondary | ICD-10-CM | POA: Diagnosis not present

## 2023-01-09 DIAGNOSIS — Z7189 Other specified counseling: Secondary | ICD-10-CM | POA: Diagnosis not present

## 2023-01-09 DIAGNOSIS — I4891 Unspecified atrial fibrillation: Secondary | ICD-10-CM | POA: Diagnosis not present

## 2023-01-09 DIAGNOSIS — K649 Unspecified hemorrhoids: Secondary | ICD-10-CM | POA: Diagnosis not present

## 2023-01-09 DIAGNOSIS — F209 Schizophrenia, unspecified: Secondary | ICD-10-CM | POA: Diagnosis not present

## 2023-01-09 DIAGNOSIS — F2089 Other schizophrenia: Secondary | ICD-10-CM | POA: Diagnosis not present

## 2023-01-09 DIAGNOSIS — Z743 Need for continuous supervision: Secondary | ICD-10-CM | POA: Diagnosis not present

## 2023-01-09 DIAGNOSIS — T796XXD Traumatic ischemia of muscle, subsequent encounter: Secondary | ICD-10-CM | POA: Diagnosis not present

## 2023-01-09 DIAGNOSIS — I480 Paroxysmal atrial fibrillation: Secondary | ICD-10-CM | POA: Diagnosis not present

## 2023-01-09 DIAGNOSIS — F341 Dysthymic disorder: Secondary | ICD-10-CM | POA: Diagnosis not present

## 2023-01-09 DIAGNOSIS — R41841 Cognitive communication deficit: Secondary | ICD-10-CM | POA: Diagnosis not present

## 2023-01-09 DIAGNOSIS — I959 Hypotension, unspecified: Secondary | ICD-10-CM | POA: Diagnosis not present

## 2023-01-09 DIAGNOSIS — K8020 Calculus of gallbladder without cholecystitis without obstruction: Secondary | ICD-10-CM | POA: Diagnosis not present

## 2023-01-09 DIAGNOSIS — Z9181 History of falling: Secondary | ICD-10-CM | POA: Diagnosis not present

## 2023-01-09 DIAGNOSIS — I059 Rheumatic mitral valve disease, unspecified: Secondary | ICD-10-CM | POA: Diagnosis not present

## 2023-01-09 DIAGNOSIS — E785 Hyperlipidemia, unspecified: Secondary | ICD-10-CM | POA: Diagnosis not present

## 2023-01-09 DIAGNOSIS — M6281 Muscle weakness (generalized): Secondary | ICD-10-CM | POA: Diagnosis not present

## 2023-01-09 DIAGNOSIS — E1165 Type 2 diabetes mellitus with hyperglycemia: Secondary | ICD-10-CM | POA: Diagnosis not present

## 2023-01-09 DIAGNOSIS — F419 Anxiety disorder, unspecified: Secondary | ICD-10-CM | POA: Diagnosis not present

## 2023-01-09 LAB — GLUCOSE, CAPILLARY: Glucose-Capillary: 178 mg/dL — ABNORMAL HIGH (ref 70–99)

## 2023-01-09 NOTE — Care Management Important Message (Signed)
Important Message  Patient Details  Name: Deanna Shea MRN: [id number] Date of Birth: Mar 26, 1946   Medicare Important Message Given:  Yes     Sherilyn Banker 01/09/2023, 12:28 PM

## 2023-01-09 NOTE — Plan of Care (Signed)

## 2023-01-09 NOTE — Progress Notes (Signed)
   01/09/23 1003  Mobility  Activity Transferred to/from [facility];Ambulated with assistance in hallway  Level of Assistance Contact guard assist, steadying assist (Required MinA STS)  Assistive Device Front wheel walker  Distance Ambulated (ft) 110 ft  Activity Response Tolerated well  Mobility Referral Yes  $Mobility charge 1 Mobility  Mobility Specialist Start Time (ACUTE ONLY) [phone]  Mobility Specialist Stop Time (ACUTE ONLY) 0957  Mobility Specialist Time Calculation (min) (ACUTE ONLY) 14 min   Mobility Specialist: Progress Note  Pt agreeable to mobility session - received in bed. Required minA STS from bed and BSC using RW. CG during ambulation with no complaints.  Pt with dark yellow and pungent void in BSC, completed pericare independently. Pt returned to bed with all needs met - call bell within reach. Bed alarm on.   Barnie Mort, BS Mobility Specialist Please contact via SecureChat or Rehab office at [phone].

## 2023-01-09 NOTE — TOC Progression Note (Deleted)
Transition of Care [facility]) - Progression Note    Patient Details  Name: Deanna Shea MRN: [id number] Date of Birth: [birth year]  Transition of Care [facility]) CM/SW Contact  Lorri Frederick, LCSW Phone Number: 01/09/2023, 9:44 AM  Clinical Narrative:   [facility] does offer, Amil Amen informed.  She would still like response from [facility].  Message sent to [facility].     Expected Discharge Plan: Skilled Nursing Facility Barriers to Discharge: Continued Medical Work up, SNF Pending bed offer  Expected Discharge Plan and Services     Post Acute Care Choice: Skilled Nursing Facility Living arrangements for the past 2 months: Apartment Expected Discharge Date: 01/06/23                                     Social Determinants of Health (SDOH) Interventions SDOH Screenings   Food Insecurity: No Food Insecurity (01/03/2023)  Housing: Low Risk  (01/03/2023)  Transportation Needs: No Transportation Needs (01/03/2023)  Utilities: Not At Risk (01/03/2023)  Tobacco Use: Low Risk  (01/04/2023)    Readmission Risk Interventions     No data to display

## 2023-01-09 NOTE — Progress Notes (Signed)
Pt did not have labs ordered this morning and is unsure if provider intended to order. Did send a message to attending. Pt had remarkable night with no complaints. Aware pt is supposed to discharge this evening.

## 2023-01-09 NOTE — Care Plan (Signed)
Patient discharged to [facility]. Report given to Zada Girt, LPN at facility. Patient being transported by [facility]. All personal belongings taken with patient.

## 2023-01-09 NOTE — TOC Progression Note (Signed)
Transition of Care [facility]) - Progression Note    Patient Details  Name: Deanna Shea MRN: [id number] Date of Birth: November 23, 1945  Transition of Care [facility]) CM/SW Contact  Lorri Frederick, LCSW Phone Number: 01/09/2023, 8:44 AM  Clinical Narrative:   CSW confirmed with [facility] that they can receive pt today.  MD informed.  CSW spoke with DIL Cheralyn, discussed transportation.  She and Brett Canales are not able to transport, would like to use [facility], aware they could be charged for this.     Expected Discharge Plan: Skilled Nursing Facility Barriers to Discharge: Continued Medical Work up, SNF Pending bed offer  Expected Discharge Plan and Services     Post Acute Care Choice: Skilled Nursing Facility Living arrangements for the past 2 months: Apartment Expected Discharge Date: 01/06/23                                     Social Determinants of Health (SDOH) Interventions SDOH Screenings   Food Insecurity: No Food Insecurity (01/03/2023)  Housing: Low Risk  (01/03/2023)  Transportation Needs: No Transportation Needs (01/03/2023)  Utilities: Not At Risk (01/03/2023)  Tobacco Use: Low Risk  (01/04/2023)    Readmission Risk Interventions     No data to display

## 2023-01-09 NOTE — Discharge Summary (Signed)
[facility]  Physician Discharge Summary   Patient ID: Deanna Shea MRN: [id number] DOB/AGE: 14-Sep-1945 77 y.o.  Admit date: 01/02/2023 Discharge date:   01/09/2023   PCP: Darrow Bussing, MD  DISCHARGE DIAGNOSES:    Rhabdomyolysis Nephrolithiasis Paroxysmal atrial fibrillation Hyperlipidemia History of anxiety depression and schizophrenia Hypothyroidism Diabetes mellitus type 2 Incidental cholelithiasis   RECOMMENDATIONS FOR OUTPATIENT FOLLOW UP: Please check CBC and basic metabolic panel in 1 week Consider non-urgent referral to urology for nephrolithiasis Consider referral to GI for rectal wall thickening noted on CT. Patient previously seen by Dr. Dulce Sellar with [facility] gastroenterology.   Home Health: Going to SNF Equipment/Devices: None  CODE STATUS: Full code  DISCHARGE CONDITION: fair  Diet recommendation: Heart healthy  INITIAL HISTORY: 77 y.o. female with a known history of anxiety, depression, atrial fibrillation on aspirin, diabetes, mitral valve prolapse, schizophrenia presented to the emergency department for evaluation of fall.  Patient was in a usual state of health until 3 days prior to this admission when she fell in her home, states that it was a mechanical fall but she has been unable to get up due to weakness and pain.  Her daughter called for help when she could not get in touch with patient.  She was brought into the emergency department.  Was found to have rhabdomyolysis and was hospitalized.    HOSPITAL COURSE:   Rhabdomyolysis Secondary to mechanical fall.  CK level was 2675.  Patient was started on IV fluids.  Reason for her fall is not entirely clear.  She denies any syncopal episode.  She mentions that she stumbled on something and fell but could not get up.  No injuries identified on imaging studies or examination.   CK level has improved.  Renal function was back to normal.  IV fluids were discontinued. Seen by PT and OT.  Skilled  nursing facility is recommended for short-term rehab.   Paroxysmal atrial fibrillation Noted to be on aspirin digoxin and propranolol.  Digoxin level is 0.4.   Nephrolithiasis/ureterolithiasis Incidentally noted to have kidney stones and also a stone in the mid left ureter.  No hydronephrosis noted.  Patient currently asymptomatic.   CT renal stone study was done on 8/30 which did not show any obstruction.  No ureteral lithiasis was noted. Discussed with patient's daughter-in-law who mentioned that patient has a known history of nephrolithiasis and passes stones on a regular basis.  Patient does not follow-up with urology on a regular basis.   Hyperlipidemia Okay to resume statin   History of anxiety depression and schizophrenia Continue Lexapro and Risperdal.   History of hypothyroidism Continue levothyroxine.   History of diabetes mellitus type 2 Okay to resume home medications   Incidental cholelithiasis Asymptomatic.  LFTs unremarkable.  Rectal wall thickening CT scan did raise concern for thickened rectal wall.  Patient not having diarrhea.  She mentioned that she was having diarrhea a few days ago.  Could be a sequelae of that.  No further inpatient workup.  She has had colonoscopy about 8 years ago by Dr. Dulce Sellar with [facility] gastroenterology.  Would recommend referral to GI once she has completed rehabilitation to consider repeat colonoscopy or flexible sigmoidoscopy.  Patient is stable.  Okay for discharge to SNF when bed is available.    PERTINENT LABS:  The results of significant diagnostics from this hospitalization (including imaging, microbiology, ancillary and laboratory) are listed below for reference.     Labs:   Basic Metabolic Panel: Recent Labs  Lab 01/02/23  2218 01/03/23 1423 01/04/23 0539 01/05/23 0652  NA 144 139 141 138  K 3.6 3.6 3.5 3.6  CL 102 101 103 103  CO2 27 24 25 28   GLUCOSE 205* 143* 117* 127*  BUN 46* 32* 22 16  CREATININE 0.98 0.66  1.03* 0.69  CALCIUM 9.9 8.2* 8.0* 8.3*  MG 2.3  --   --  1.9   Liver Function Tests: Recent Labs  Lab 01/04/23 0539  AST 45*  ALT 37  ALKPHOS 55  BILITOT 1.1  PROT 5.6*  ALBUMIN 2.5*    CBC: Recent Labs  Lab 01/02/23 2218 01/04/23 0539  WBC 10.7* 7.2  NEUTROABS 9.1*  --   HGB 15.6* 12.9  HCT 47.6* 40.1  MCV 91.2 91.1  PLT 326 245   Cardiac Enzymes: Recent Labs  Lab 01/02/23 2218 01/03/23 1423 01/04/23 0539 01/05/23 0652 01/06/23 0250  CKTOTAL 2,675* 1,742* 972* 381* 157     CBG: Recent Labs  Lab 01/08/23 0755 01/08/23 1132 01/08/23 1601 01/08/23 2003 01/09/23 0746  GLUCAP 199* 110* 172* 126* 178*     IMAGING STUDIES CT RENAL STONE STUDY  Result Date: 01/05/2023 CLINICAL DATA:  Urolithiasis * Tracking Code: BO * EXAM: CT ABDOMEN AND PELVIS WITHOUT CONTRAST TECHNIQUE: Multidetector CT imaging of the abdomen and pelvis was performed following the standard protocol without IV contrast. RADIATION DOSE REDUCTION: This exam was performed according to the departmental dose-optimization program which includes automated exposure control, adjustment of the mA and/or kV according to patient size and/or use of iterative reconstruction technique. COMPARISON:  01/27/2014 FINDINGS: Lower chest: No acute abnormality. Hepatobiliary: No solid liver abnormality is seen. Gallstones. No gallbladder wall thickening, or biliary dilatation. Pancreas: Unremarkable. No pancreatic ductal dilatation or surrounding inflammatory changes. Spleen: Normal in size without significant abnormality. Adrenals/Urinary Tract: Adrenal glands are unremarkable. Multiple bilateral nonobstructive renal calculi as well as medullary nephrocalcinosis. Bladder is unremarkable. Stomach/Bowel: Stomach is within normal limits. Status post appendectomy. Distal small bowel resection and reanastomosis in the right lower quadrant. Circumferential wall thickening of the rectum with adjacent perirectal and presacral fat  stranding (series 3, image 74). Vascular/Lymphatic: Aortic atherosclerosis. Multiple small phleboliths in the pelvis, several of which are adjacent to the ureters, however are not favored to reflect urinary tract calculi. No enlarged abdominal or pelvic lymph nodes. Reproductive: No mass or other significant abnormality. Other: No abdominal wall hernia or abnormality. No ascites. Musculoskeletal: No acute or significant osseous findings. IMPRESSION: 1. Circumferential wall thickening of the rectum with adjacent perirectal and presacral fat stranding, consistent with proctitis although this appearance is worrisome for an underlying rectal malignancy. 2. Multiple bilateral nonobstructive renal calculi as well as medullary nephrocalcinosis. No hydronephrosis. 3. Multiple small phleboliths in the pelvis, several of which are adjacent to the ureters, however not favored to reflect urinary tract calculi. These could be more confidently excluded by contrast enhanced CT urogram including urographic delayed phase imaging if desired. 4. Cholelithiasis. Aortic Atherosclerosis (ICD10-I70.0). Electronically Signed   By: Jearld Lesch M.D.   On: 01/05/2023 13:09   CT Lumbar Spine Wo Contrast  Result Date: 01/03/2023 CLINICAL DATA:  Found down EXAM: CT LUMBAR SPINE WITHOUT CONTRAST TECHNIQUE: Multidetector CT imaging of the lumbar spine was performed without intravenous contrast administration. Multiplanar CT image reconstructions were also generated. RADIATION DOSE REDUCTION: This exam was performed according to the departmental dose-optimization program which includes automated exposure control, adjustment of the mA and/or kV according to patient size and/or use of iterative reconstruction technique. COMPARISON:  None Available. FINDINGS: Segmentation: 5 lumbar type vertebrae. Alignment: Normal. Vertebrae: No acute fracture or focal pathologic process. Paraspinal and other soft tissues: Cholelithiasis. There are multiple  bilateral renal calculi. There is a 3 mm calculus in the mid left ureter. No hydronephrosis. Disc levels: No spinal canal stenosis. Degenerative disc disease is greatest at L5-S1. Mild right L5 foraminal stenosis. IMPRESSION: 1. No acute fracture or traumatic malalignment of the lumbar spine. 2. A 3 mm calculus in the mid left ureter. No hydronephrosis. 3. Cholelithiasis and nephrolithiasis. Electronically Signed   By: Deatra Robinson M.D.   On: 01/03/2023 00:21   CT Hip Left Wo Contrast  Result Date: 01/03/2023 CLINICAL DATA:  Trauma to the left hip. Pain. Concern for fracture. EXAM: CT OF THE LEFT HIP WITHOUT CONTRAST TECHNIQUE: Multidetector CT imaging of the left hip was performed according to the standard protocol. Multiplanar CT image reconstructions were also generated. RADIATION DOSE REDUCTION: This exam was performed according to the departmental dose-optimization program which includes automated exposure control, adjustment of the mA and/or kV according to patient size and/or use of iterative reconstruction technique. COMPARISON:  CT abdomen pelvis dated 01/27/2014. FINDINGS: Bones/Joint/Cartilage No acute fracture or dislocation. Mild degenerative changes of the left hip. No joint effusion. Ligaments Suboptimally assessed by CT. Muscles and Tendons No acute findings.  No intramuscular fluid collection or hematoma. Soft tissues No fluid collection or hematoma. Mild stranding of the subcutaneous fat of the lateral left hip. A string of adjacent stones in the left hemipelvis measuring approximately 2.5 cm in length may be vascular calcification but appear to be in the distal left ureter. Additional small stones noted along the left posterior bladder wall. CT of the abdomen pelvis may provide better evaluation. IMPRESSION: 1. No acute fracture or dislocation. 2. Probable string of stones in the distal left ureter as well as stone in the urinary bladder. 3. CT of the abdomen pelvis may provide better  evaluation. Electronically Signed   By: Elgie Collard M.D.   On: 01/03/2023 00:21   CT Head Wo Contrast  Result Date: 01/02/2023 CLINICAL DATA:  Found down on floor, initial encounter EXAM: CT HEAD WITHOUT CONTRAST TECHNIQUE: Contiguous axial images were obtained from the base of the skull through the vertex without intravenous contrast. RADIATION DOSE REDUCTION: This exam was performed according to the departmental dose-optimization program which includes automated exposure control, adjustment of the mA and/or kV according to patient size and/or use of iterative reconstruction technique. COMPARISON:  None Available. FINDINGS: Brain: No evidence of acute infarction, hemorrhage, hydrocephalus, extra-axial collection or mass lesion/mass effect. Mild atrophic changes and chronic white matter ischemic changes noted. Vascular: No hyperdense vessel or unexpected calcification. Skull: Normal. Negative for fracture or focal lesion. Sinuses/Orbits: No acute finding. Chronic appearing mucosal thickening is noted within the left maxillary antrum. Bilateral antrectomies are seen. Other: None. IMPRESSION: Chronic atrophic and ischemic changes without acute abnormality. Changes of chronic sinusitis. Electronically Signed   By: Alcide Clever M.D.   On: 01/02/2023 23:28    DISCHARGE EXAMINATION: Vitals:   01/08/23 1730 01/08/23 2002 01/09/23 0414 01/09/23 0748  BP: (!) 112/57 (!) 98/42 137/62 (!) 126/56  Pulse: 65 62 65 63  Resp:  17 17 18   Temp:  97.9 F (36.6 C) 97.9 F (36.6 C) 98.4 F (36.9 C)  TempSrc:    Oral  SpO2:  98% 95% 98%  Weight:      Height:       General appearance: Awake alert.  In  no distress Resp: Clear to auscultation bilaterally.  Normal effort Cardio: S1-S2 is normal regular.  No S3-S4.  No rubs murmurs or bruit GI: Abdomen is soft.  Nontender nondistended.  Bowel sounds are present normal.  No masses organomegaly   DISPOSITION: SNF  Discharge Instructions     Call MD for:   difficulty breathing, headache or visual disturbances   Complete by: As directed    Call MD for:  extreme fatigue   Complete by: As directed    Call MD for:  persistant dizziness or light-headedness   Complete by: As directed    Call MD for:  persistant nausea and vomiting   Complete by: As directed    Call MD for:  severe uncontrolled pain   Complete by: As directed    Call MD for:  temperature >100.4   Complete by: As directed    Diet - low sodium heart healthy   Complete by: As directed    Discharge instructions   Complete by: As directed    Please review instructions on the discharge summary.  You were cared for by a hospitalist during your hospital stay. If you have any questions about your discharge medications or the care you received while you were in the hospital after you are discharged, you can call the unit and asked to speak with the hospitalist on call if the hospitalist that took care of you is not available. Once you are discharged, your primary care physician will handle any further medical issues. Please note that NO REFILLS for any discharge medications will be authorized once you are discharged, as it is imperative that you return to your primary care physician (or establish a relationship with a primary care physician if you do not have one) for your aftercare needs so that they can reassess your need for medications and monitor your lab values. If you do not have a primary care physician, you can call [phone] for a physician referral.   Increase activity slowly   Complete by: As directed          Allergies as of 01/09/2023       Reactions   Sulfa Antibiotics Other (See Comments)   Sores in mouth        Medication List     STOP taking these medications    OVER THE COUNTER MEDICATION       TAKE these medications    acetaminophen 325 MG tablet Commonly known as: TYLENOL Take 2 tablets (650 mg total) by mouth every 6 (six) hours as needed for mild  pain (or Fever >/= 101).   aspirin EC 81 MG tablet Take 81 mg by mouth daily.   digoxin 0.25 MG tablet Commonly known as: LANOXIN Take one tablet by mouth on Mon, Wed and Friday What changed:  how much to take how to take this when to take this additional instructions   escitalopram 20 MG tablet Commonly known as: LEXAPRO Take 20 mg by mouth daily.   levothyroxine 88 MCG tablet Commonly known as: SYNTHROID Take 88 mcg by mouth daily before breakfast.   metFORMIN 500 MG 24 hr tablet Commonly known as: GLUCOPHAGE-XR Take 500 mg by mouth in the morning and at bedtime.   propranolol 20 MG tablet Commonly known as: INDERAL Take 1 tablet (20 mg total) by mouth 4 (four) times daily.   risperiDONE 3 MG tablet Commonly known as: RISPERDAL Take 3 mg by mouth at bedtime. What changed: Another medication with the same  name was removed. Continue taking this medication, and follow the directions you see here.   rosuvastatin 5 MG tablet Commonly known as: CRESTOR Take 2.5 mg by mouth daily.          Follow-up Information     Koirala, Dibas, MD. Schedule an appointment as soon as possible for a visit.   Specialty: Family Medicine Why: post hospitalization follow up Contact information: [address] [place] [address] [phone]         [facility] Follow up.   Why: for kidney stones Contact information: [address] [place] [place] [address] [phone]                TOTAL DISCHARGE TIME: 35 minutes  Deanna Shea  [facility] Pager on [email or web address]  01/09/2023, 8:50 AM

## 2023-01-09 NOTE — TOC Transition Note (Signed)
Transition of Care [facility]) - CM/SW Discharge Note   Patient Details  Name: Deanna Shea MRN: [id number] Date of Birth: 09-Dec-1945  Transition of Care [facility]) CM/SW Contact:  Lorri Frederick, LCSW Phone Number: 01/09/2023, 9:51 AM   Clinical Narrative:   Pt discharging to [place].  RN call report to [phone].      Final next level of care: Skilled Nursing Facility Barriers to Discharge: Barriers Resolved   Patient Goals and CMS Choice CMS Medicare.gov Compare Post Acute Care list provided to:: Patient Choice offered to / list presented to : Patient  Discharge Placement                Patient chooses bed at: [facility] Patient to be transferred to facility by: [facility] Name of family member notified: Cheralyn, DIL Patient and family notified of of transfer: 01/09/23  Discharge Plan and Services Additional resources added to the After Visit Summary for       Post Acute Care Choice: Skilled Nursing Facility                               Social Determinants of Health (SDOH) Interventions SDOH Screenings   Food Insecurity: No Food Insecurity (01/03/2023)  Housing: Low Risk  (01/03/2023)  Transportation Needs: No Transportation Needs (01/03/2023)  Utilities: Not At Risk (01/03/2023)  Tobacco Use: Low Risk  (01/04/2023)     Readmission Risk Interventions     No data to display

## 2023-01-11 DIAGNOSIS — F411 Generalized anxiety disorder: Secondary | ICD-10-CM | POA: Diagnosis not present

## 2023-01-11 DIAGNOSIS — E039 Hypothyroidism, unspecified: Secondary | ICD-10-CM | POA: Diagnosis not present

## 2023-01-11 DIAGNOSIS — K8020 Calculus of gallbladder without cholecystitis without obstruction: Secondary | ICD-10-CM | POA: Diagnosis not present

## 2023-01-11 DIAGNOSIS — T796XXD Traumatic ischemia of muscle, subsequent encounter: Secondary | ICD-10-CM | POA: Diagnosis not present

## 2023-01-11 DIAGNOSIS — F341 Dysthymic disorder: Secondary | ICD-10-CM | POA: Diagnosis not present

## 2023-01-11 DIAGNOSIS — E1165 Type 2 diabetes mellitus with hyperglycemia: Secondary | ICD-10-CM | POA: Diagnosis not present

## 2023-01-11 DIAGNOSIS — I480 Paroxysmal atrial fibrillation: Secondary | ICD-10-CM | POA: Diagnosis not present

## 2023-01-11 DIAGNOSIS — E785 Hyperlipidemia, unspecified: Secondary | ICD-10-CM | POA: Diagnosis not present

## 2023-01-11 DIAGNOSIS — N200 Calculus of kidney: Secondary | ICD-10-CM | POA: Diagnosis not present

## 2023-01-12 DIAGNOSIS — R2681 Unsteadiness on feet: Secondary | ICD-10-CM | POA: Diagnosis not present

## 2023-01-12 DIAGNOSIS — F209 Schizophrenia, unspecified: Secondary | ICD-10-CM | POA: Diagnosis not present

## 2023-01-12 DIAGNOSIS — M6281 Muscle weakness (generalized): Secondary | ICD-10-CM | POA: Diagnosis not present

## 2023-01-12 DIAGNOSIS — Z9181 History of falling: Secondary | ICD-10-CM | POA: Diagnosis not present

## 2023-01-12 DIAGNOSIS — F419 Anxiety disorder, unspecified: Secondary | ICD-10-CM | POA: Diagnosis not present

## 2023-01-16 DIAGNOSIS — Z7984 Long term (current) use of oral hypoglycemic drugs: Secondary | ICD-10-CM | POA: Diagnosis not present

## 2023-01-16 DIAGNOSIS — E1165 Type 2 diabetes mellitus with hyperglycemia: Secondary | ICD-10-CM | POA: Diagnosis not present

## 2023-01-16 DIAGNOSIS — F419 Anxiety disorder, unspecified: Secondary | ICD-10-CM | POA: Diagnosis not present

## 2023-01-16 DIAGNOSIS — K649 Unspecified hemorrhoids: Secondary | ICD-10-CM | POA: Diagnosis not present

## 2023-01-16 DIAGNOSIS — S71102A Unspecified open wound, left thigh, initial encounter: Secondary | ICD-10-CM | POA: Diagnosis not present

## 2023-01-16 DIAGNOSIS — I4891 Unspecified atrial fibrillation: Secondary | ICD-10-CM | POA: Diagnosis not present

## 2023-01-16 DIAGNOSIS — E039 Hypothyroidism, unspecified: Secondary | ICD-10-CM | POA: Diagnosis not present

## 2023-01-19 DIAGNOSIS — R2681 Unsteadiness on feet: Secondary | ICD-10-CM | POA: Diagnosis not present

## 2023-01-19 DIAGNOSIS — F209 Schizophrenia, unspecified: Secondary | ICD-10-CM | POA: Diagnosis not present

## 2023-01-19 DIAGNOSIS — Z9181 History of falling: Secondary | ICD-10-CM | POA: Diagnosis not present

## 2023-01-19 DIAGNOSIS — F419 Anxiety disorder, unspecified: Secondary | ICD-10-CM | POA: Diagnosis not present

## 2023-01-19 DIAGNOSIS — M6281 Muscle weakness (generalized): Secondary | ICD-10-CM | POA: Diagnosis not present

## 2023-01-22 DIAGNOSIS — R2681 Unsteadiness on feet: Secondary | ICD-10-CM | POA: Diagnosis not present

## 2023-01-22 DIAGNOSIS — F209 Schizophrenia, unspecified: Secondary | ICD-10-CM | POA: Diagnosis not present

## 2023-01-22 DIAGNOSIS — Z9181 History of falling: Secondary | ICD-10-CM | POA: Diagnosis not present

## 2023-01-22 DIAGNOSIS — M6281 Muscle weakness (generalized): Secondary | ICD-10-CM | POA: Diagnosis not present

## 2023-01-22 DIAGNOSIS — F419 Anxiety disorder, unspecified: Secondary | ICD-10-CM | POA: Diagnosis not present

## 2023-01-23 DIAGNOSIS — E1165 Type 2 diabetes mellitus with hyperglycemia: Secondary | ICD-10-CM | POA: Diagnosis not present

## 2023-01-23 DIAGNOSIS — I480 Paroxysmal atrial fibrillation: Secondary | ICD-10-CM | POA: Diagnosis not present

## 2023-01-23 DIAGNOSIS — N200 Calculus of kidney: Secondary | ICD-10-CM | POA: Diagnosis not present

## 2023-01-23 DIAGNOSIS — T796XXD Traumatic ischemia of muscle, subsequent encounter: Secondary | ICD-10-CM | POA: Diagnosis not present

## 2023-01-23 DIAGNOSIS — K8020 Calculus of gallbladder without cholecystitis without obstruction: Secondary | ICD-10-CM | POA: Diagnosis not present

## 2023-01-23 DIAGNOSIS — E039 Hypothyroidism, unspecified: Secondary | ICD-10-CM | POA: Diagnosis not present

## 2023-01-23 DIAGNOSIS — K649 Unspecified hemorrhoids: Secondary | ICD-10-CM | POA: Diagnosis not present

## 2023-01-23 DIAGNOSIS — S71102A Unspecified open wound, left thigh, initial encounter: Secondary | ICD-10-CM | POA: Diagnosis not present

## 2023-01-23 DIAGNOSIS — K639 Disease of intestine, unspecified: Secondary | ICD-10-CM | POA: Diagnosis not present

## 2023-01-23 DIAGNOSIS — Z7189 Other specified counseling: Secondary | ICD-10-CM | POA: Diagnosis not present

## 2023-01-24 DIAGNOSIS — E1165 Type 2 diabetes mellitus with hyperglycemia: Secondary | ICD-10-CM | POA: Diagnosis not present

## 2023-01-24 DIAGNOSIS — E039 Hypothyroidism, unspecified: Secondary | ICD-10-CM | POA: Diagnosis not present

## 2023-01-24 DIAGNOSIS — K644 Residual hemorrhoidal skin tags: Secondary | ICD-10-CM | POA: Diagnosis not present

## 2023-01-24 DIAGNOSIS — T796XXD Traumatic ischemia of muscle, subsequent encounter: Secondary | ICD-10-CM | POA: Diagnosis not present

## 2023-01-24 DIAGNOSIS — I480 Paroxysmal atrial fibrillation: Secondary | ICD-10-CM | POA: Diagnosis not present

## 2023-01-24 DIAGNOSIS — K639 Disease of intestine, unspecified: Secondary | ICD-10-CM | POA: Diagnosis not present

## 2023-01-24 DIAGNOSIS — F209 Schizophrenia, unspecified: Secondary | ICD-10-CM | POA: Diagnosis not present

## 2023-01-24 DIAGNOSIS — K8020 Calculus of gallbladder without cholecystitis without obstruction: Secondary | ICD-10-CM | POA: Diagnosis not present

## 2023-01-24 DIAGNOSIS — N200 Calculus of kidney: Secondary | ICD-10-CM | POA: Diagnosis not present

## 2023-01-29 DIAGNOSIS — M6281 Muscle weakness (generalized): Secondary | ICD-10-CM | POA: Diagnosis not present

## 2023-01-29 DIAGNOSIS — R2681 Unsteadiness on feet: Secondary | ICD-10-CM | POA: Diagnosis not present

## 2023-01-29 DIAGNOSIS — Z9181 History of falling: Secondary | ICD-10-CM | POA: Diagnosis not present

## 2023-01-30 DIAGNOSIS — R933 Abnormal findings on diagnostic imaging of other parts of digestive tract: Secondary | ICD-10-CM | POA: Diagnosis not present

## 2023-01-30 DIAGNOSIS — K649 Unspecified hemorrhoids: Secondary | ICD-10-CM | POA: Diagnosis not present

## 2023-01-30 DIAGNOSIS — K635 Polyp of colon: Secondary | ICD-10-CM | POA: Diagnosis not present

## 2023-01-31 DIAGNOSIS — Z9181 History of falling: Secondary | ICD-10-CM | POA: Diagnosis not present

## 2023-01-31 DIAGNOSIS — M6281 Muscle weakness (generalized): Secondary | ICD-10-CM | POA: Diagnosis not present

## 2023-01-31 DIAGNOSIS — R2681 Unsteadiness on feet: Secondary | ICD-10-CM | POA: Diagnosis not present

## 2023-02-02 DIAGNOSIS — K635 Polyp of colon: Secondary | ICD-10-CM | POA: Diagnosis not present

## 2023-02-04 DIAGNOSIS — M6281 Muscle weakness (generalized): Secondary | ICD-10-CM | POA: Diagnosis not present

## 2023-02-04 DIAGNOSIS — R2681 Unsteadiness on feet: Secondary | ICD-10-CM | POA: Diagnosis not present

## 2023-02-04 DIAGNOSIS — Z9181 History of falling: Secondary | ICD-10-CM | POA: Diagnosis not present

## 2023-02-05 DIAGNOSIS — R2681 Unsteadiness on feet: Secondary | ICD-10-CM | POA: Diagnosis not present

## 2023-02-05 DIAGNOSIS — Z9181 History of falling: Secondary | ICD-10-CM | POA: Diagnosis not present

## 2023-02-05 DIAGNOSIS — M6281 Muscle weakness (generalized): Secondary | ICD-10-CM | POA: Diagnosis not present

## 2023-02-06 ENCOUNTER — Ambulatory Visit: Payer: Medicare HMO | Admitting: Interventional Cardiology

## 2023-02-06 DIAGNOSIS — R2681 Unsteadiness on feet: Secondary | ICD-10-CM | POA: Diagnosis not present

## 2023-02-06 DIAGNOSIS — Z9181 History of falling: Secondary | ICD-10-CM | POA: Diagnosis not present

## 2023-02-06 DIAGNOSIS — M6281 Muscle weakness (generalized): Secondary | ICD-10-CM | POA: Diagnosis not present

## 2023-02-07 DIAGNOSIS — R41841 Cognitive communication deficit: Secondary | ICD-10-CM | POA: Diagnosis not present

## 2023-02-07 DIAGNOSIS — R4181 Age-related cognitive decline: Secondary | ICD-10-CM | POA: Diagnosis not present

## 2023-02-08 DIAGNOSIS — M6281 Muscle weakness (generalized): Secondary | ICD-10-CM | POA: Diagnosis not present

## 2023-02-08 DIAGNOSIS — Z9181 History of falling: Secondary | ICD-10-CM | POA: Diagnosis not present

## 2023-02-08 DIAGNOSIS — R2681 Unsteadiness on feet: Secondary | ICD-10-CM | POA: Diagnosis not present

## 2023-02-09 DIAGNOSIS — R41841 Cognitive communication deficit: Secondary | ICD-10-CM | POA: Diagnosis not present

## 2023-02-09 DIAGNOSIS — R4181 Age-related cognitive decline: Secondary | ICD-10-CM | POA: Diagnosis not present

## 2023-02-12 DIAGNOSIS — E039 Hypothyroidism, unspecified: Secondary | ICD-10-CM | POA: Diagnosis not present

## 2023-02-12 DIAGNOSIS — Z23 Encounter for immunization: Secondary | ICD-10-CM | POA: Diagnosis not present

## 2023-02-12 DIAGNOSIS — F321 Major depressive disorder, single episode, moderate: Secondary | ICD-10-CM | POA: Diagnosis not present

## 2023-02-12 DIAGNOSIS — E7800 Pure hypercholesterolemia, unspecified: Secondary | ICD-10-CM | POA: Diagnosis not present

## 2023-02-12 DIAGNOSIS — E119 Type 2 diabetes mellitus without complications: Secondary | ICD-10-CM | POA: Diagnosis not present

## 2023-02-13 DIAGNOSIS — R2681 Unsteadiness on feet: Secondary | ICD-10-CM | POA: Diagnosis not present

## 2023-02-13 DIAGNOSIS — M6281 Muscle weakness (generalized): Secondary | ICD-10-CM | POA: Diagnosis not present

## 2023-02-13 DIAGNOSIS — Z9181 History of falling: Secondary | ICD-10-CM | POA: Diagnosis not present

## 2023-02-14 DIAGNOSIS — R4181 Age-related cognitive decline: Secondary | ICD-10-CM | POA: Diagnosis not present

## 2023-02-14 DIAGNOSIS — R41841 Cognitive communication deficit: Secondary | ICD-10-CM | POA: Diagnosis not present

## 2023-02-15 DIAGNOSIS — M6281 Muscle weakness (generalized): Secondary | ICD-10-CM | POA: Diagnosis not present

## 2023-02-15 DIAGNOSIS — R2681 Unsteadiness on feet: Secondary | ICD-10-CM | POA: Diagnosis not present

## 2023-02-15 DIAGNOSIS — Z9181 History of falling: Secondary | ICD-10-CM | POA: Diagnosis not present

## 2023-02-15 IMAGING — MG MM DIGITAL SCREENING BILAT W/ TOMO AND CAD
8 series · 9 of 24 positions shown · non-contrast
Comparison: Previous exam(s).

CLINICAL DATA: Screening.

EXAM:
DIGITAL SCREENING BILATERAL MAMMOGRAM WITH TOMOSYNTHESIS AND CAD
TECHNIQUE: Bilateral screening digital craniocaudal and mediolateral oblique
mammograms were obtained. Bilateral screening digital breast
tomosynthesis was performed. The images were evaluated with
computer-aided detection.

[L CC synth-2D]
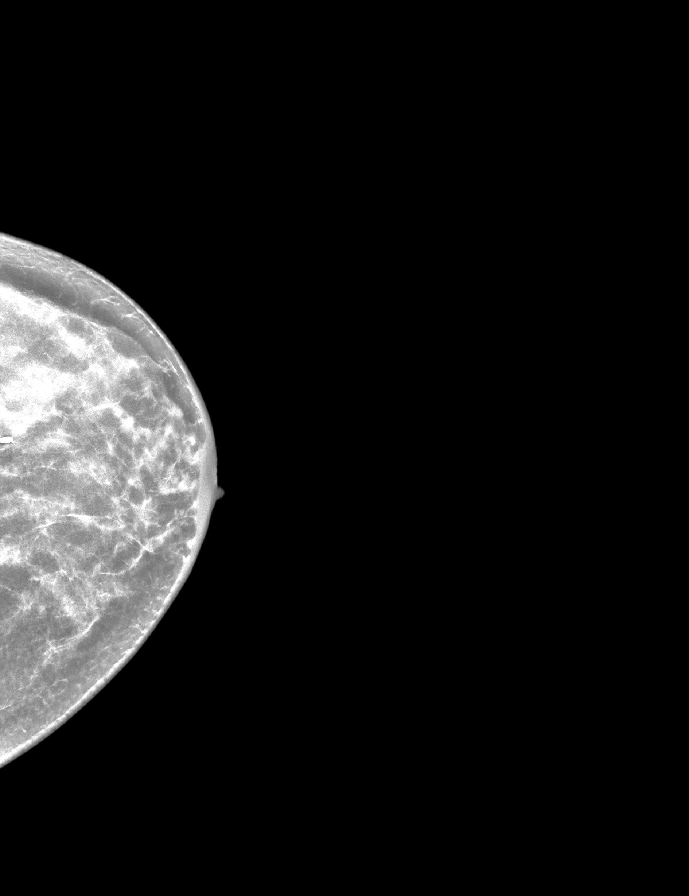

[R MLO synth-2D]
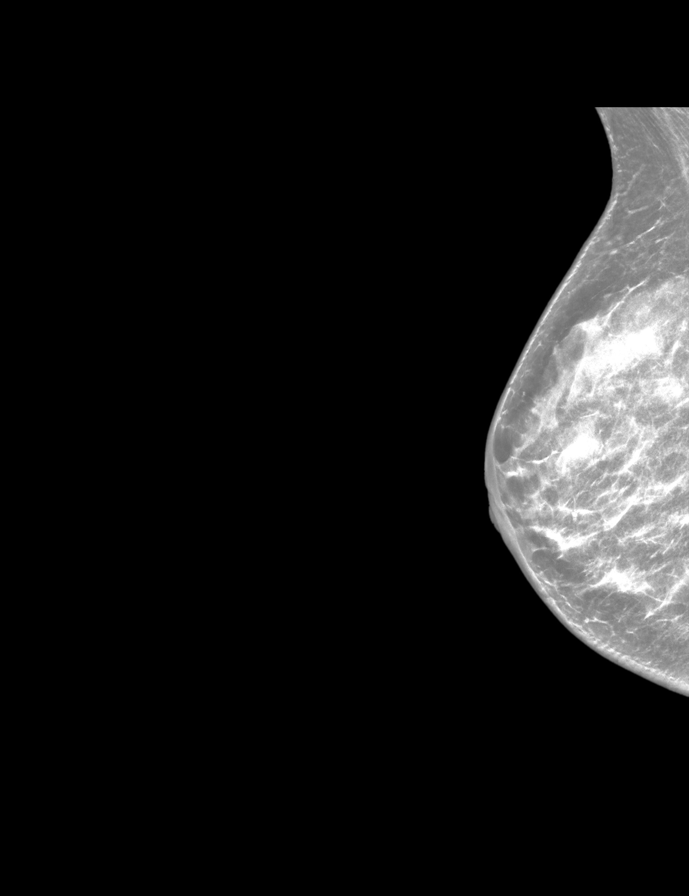

[R CC synth-2D]
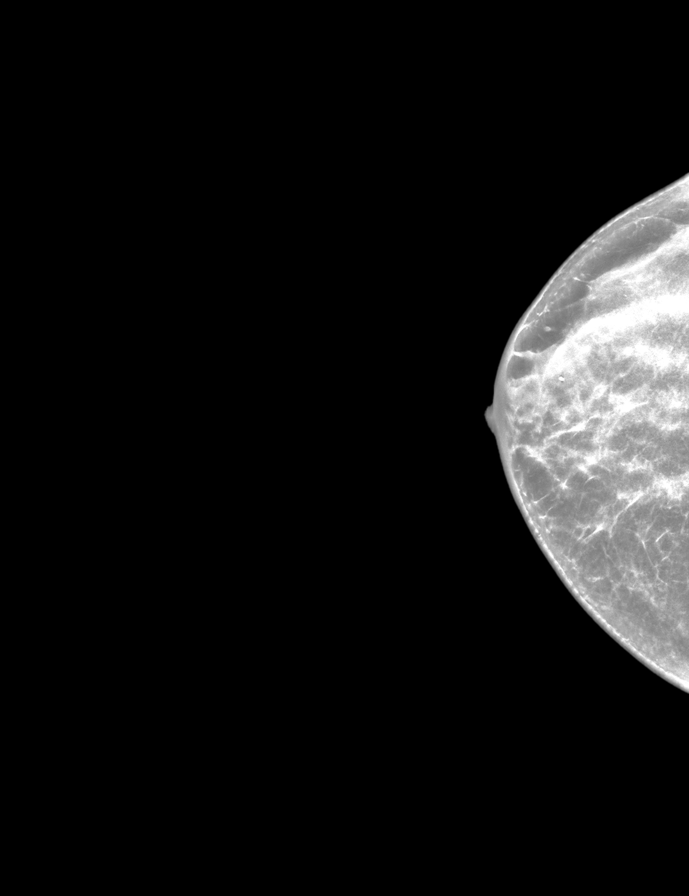

[L MLO synth-2D]
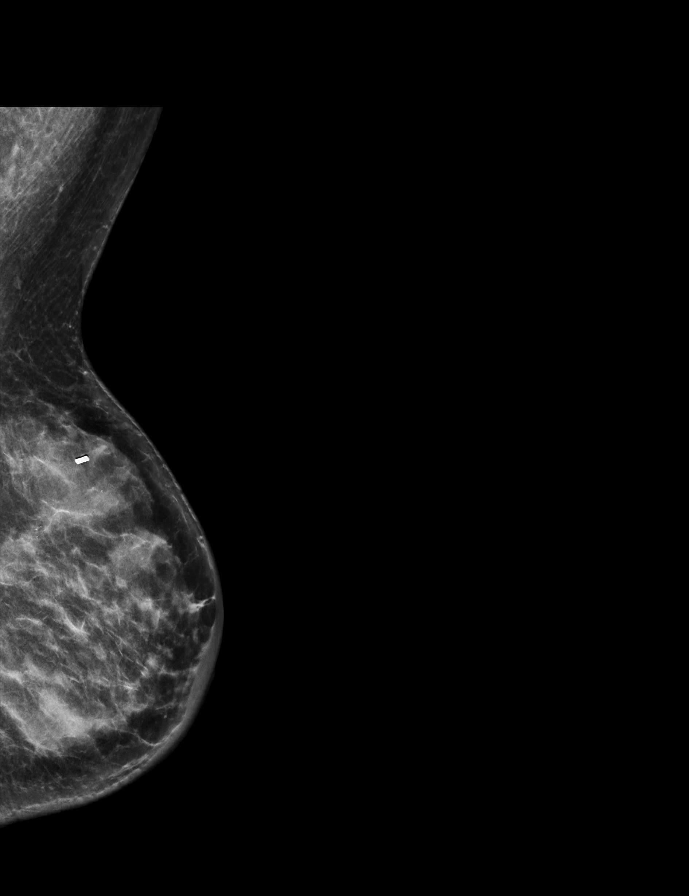

[L CC tomo · 2 of 51 frames shown]
[frame 17/51]
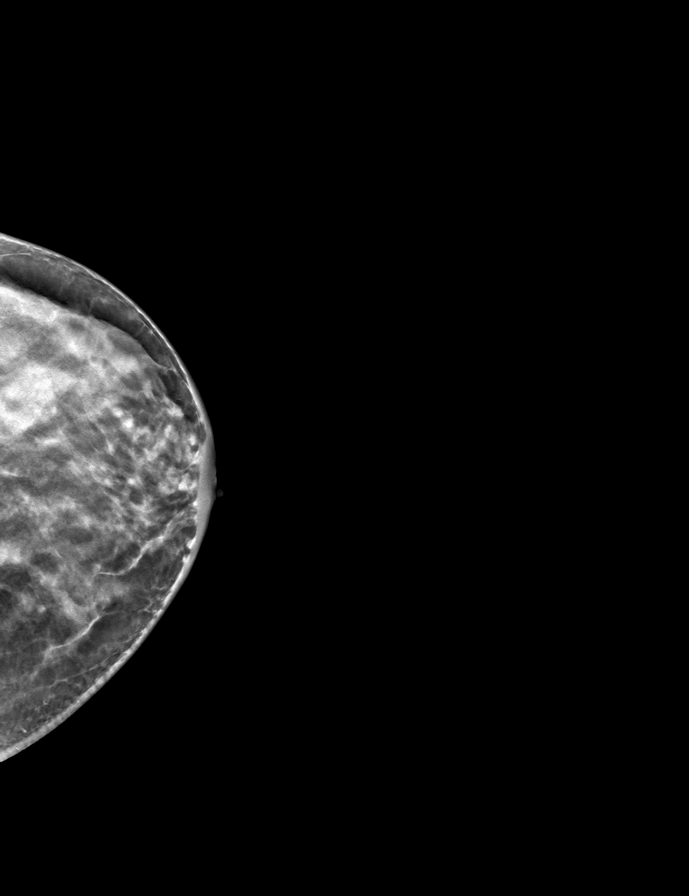
[frame 26/51]
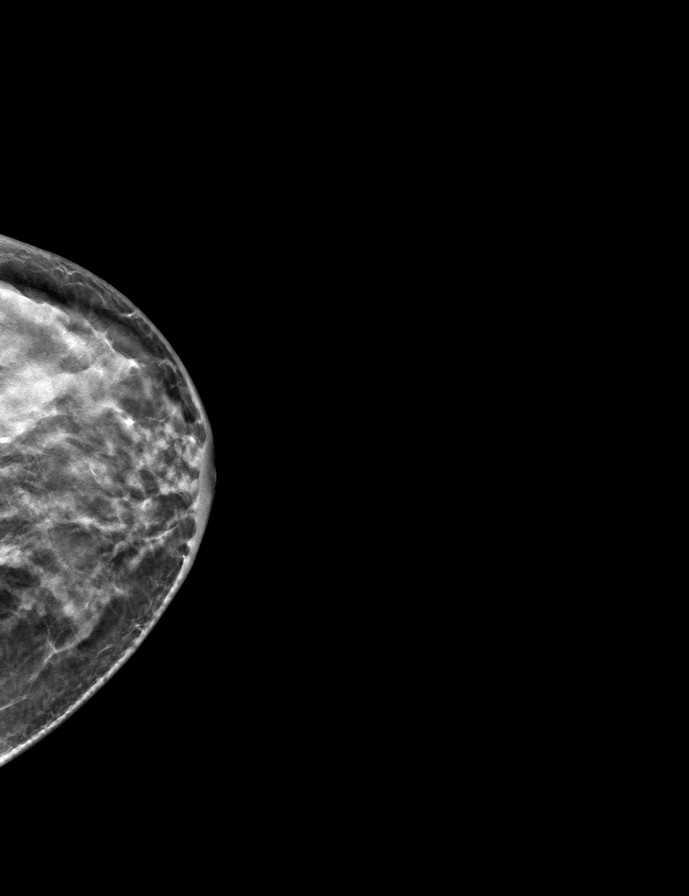

[R MLO tomo · tomo slice 27/53.0]
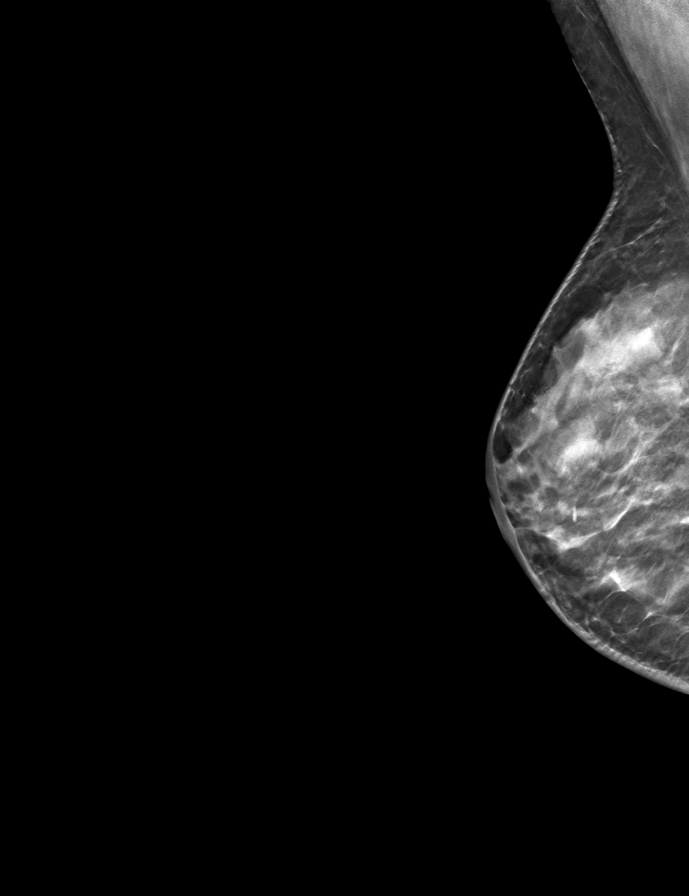

[L MLO tomo · tomo slice 30/59.0]
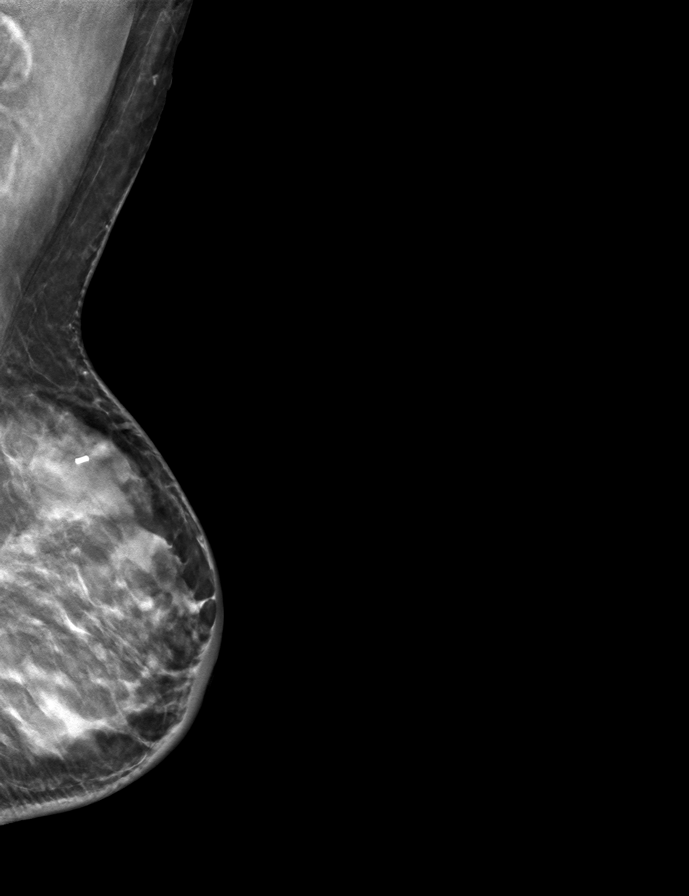

[R CC tomo · tomo slice 27/52.0]
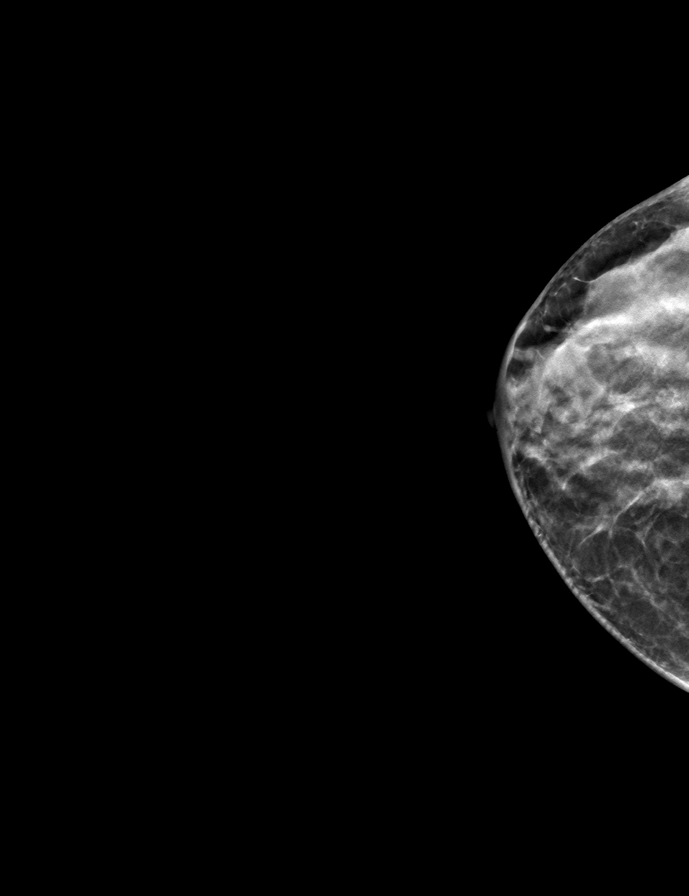

[9 of 24 positions shown; findings below may reference images not displayed]

ACR Breast Density Category d: The breast tissue is extremely dense,
which lowers the sensitivity of mammography
FINDINGS: There are no findings suspicious for malignancy. The images were
evaluated with computer-aided detection.
IMPRESSION: No mammographic evidence of malignancy. A result letter of this
screening mammogram will be mailed directly to the patient.

RECOMMENDATION:
Screening mammogram in one year. (Code:95-0-E9V)

BI-RADS CATEGORY  1: Negative.

## 2023-02-19 DIAGNOSIS — Z9181 History of falling: Secondary | ICD-10-CM | POA: Diagnosis not present

## 2023-02-19 DIAGNOSIS — M6281 Muscle weakness (generalized): Secondary | ICD-10-CM | POA: Diagnosis not present

## 2023-02-19 DIAGNOSIS — R2681 Unsteadiness on feet: Secondary | ICD-10-CM | POA: Diagnosis not present

## 2023-02-21 DIAGNOSIS — R4181 Age-related cognitive decline: Secondary | ICD-10-CM | POA: Diagnosis not present

## 2023-02-21 DIAGNOSIS — R41841 Cognitive communication deficit: Secondary | ICD-10-CM | POA: Diagnosis not present

## 2023-02-22 DIAGNOSIS — Z9181 History of falling: Secondary | ICD-10-CM | POA: Diagnosis not present

## 2023-02-22 DIAGNOSIS — R2681 Unsteadiness on feet: Secondary | ICD-10-CM | POA: Diagnosis not present

## 2023-02-22 DIAGNOSIS — M6281 Muscle weakness (generalized): Secondary | ICD-10-CM | POA: Diagnosis not present

## 2023-02-23 DIAGNOSIS — M6281 Muscle weakness (generalized): Secondary | ICD-10-CM | POA: Diagnosis not present

## 2023-02-23 DIAGNOSIS — Z9181 History of falling: Secondary | ICD-10-CM | POA: Diagnosis not present

## 2023-02-23 DIAGNOSIS — R2681 Unsteadiness on feet: Secondary | ICD-10-CM | POA: Diagnosis not present

## 2023-02-25 DIAGNOSIS — M6281 Muscle weakness (generalized): Secondary | ICD-10-CM | POA: Diagnosis not present

## 2023-02-26 DIAGNOSIS — R41841 Cognitive communication deficit: Secondary | ICD-10-CM | POA: Diagnosis not present

## 2023-02-26 DIAGNOSIS — R4181 Age-related cognitive decline: Secondary | ICD-10-CM | POA: Diagnosis not present

## 2023-02-27 DIAGNOSIS — M6281 Muscle weakness (generalized): Secondary | ICD-10-CM | POA: Diagnosis not present

## 2023-02-27 DIAGNOSIS — R2681 Unsteadiness on feet: Secondary | ICD-10-CM | POA: Diagnosis not present

## 2023-02-27 DIAGNOSIS — Z9181 History of falling: Secondary | ICD-10-CM | POA: Diagnosis not present

## 2023-02-28 ENCOUNTER — Other Ambulatory Visit: Payer: Self-pay | Admitting: Interventional Cardiology

## 2023-02-28 DIAGNOSIS — R41841 Cognitive communication deficit: Secondary | ICD-10-CM | POA: Diagnosis not present

## 2023-02-28 DIAGNOSIS — R4181 Age-related cognitive decline: Secondary | ICD-10-CM | POA: Diagnosis not present

## 2023-03-04 DIAGNOSIS — M6281 Muscle weakness (generalized): Secondary | ICD-10-CM | POA: Diagnosis not present

## 2023-03-04 DIAGNOSIS — R2681 Unsteadiness on feet: Secondary | ICD-10-CM | POA: Diagnosis not present

## 2023-03-04 DIAGNOSIS — Z9181 History of falling: Secondary | ICD-10-CM | POA: Diagnosis not present

## 2023-03-05 DIAGNOSIS — R41841 Cognitive communication deficit: Secondary | ICD-10-CM | POA: Diagnosis not present

## 2023-03-05 DIAGNOSIS — Z9181 History of falling: Secondary | ICD-10-CM | POA: Diagnosis not present

## 2023-03-05 DIAGNOSIS — R2681 Unsteadiness on feet: Secondary | ICD-10-CM | POA: Diagnosis not present

## 2023-03-05 DIAGNOSIS — M6281 Muscle weakness (generalized): Secondary | ICD-10-CM | POA: Diagnosis not present

## 2023-03-05 DIAGNOSIS — R4181 Age-related cognitive decline: Secondary | ICD-10-CM | POA: Diagnosis not present

## 2023-03-06 DIAGNOSIS — Z9181 History of falling: Secondary | ICD-10-CM | POA: Diagnosis not present

## 2023-03-06 DIAGNOSIS — M6281 Muscle weakness (generalized): Secondary | ICD-10-CM | POA: Diagnosis not present

## 2023-03-06 DIAGNOSIS — R2681 Unsteadiness on feet: Secondary | ICD-10-CM | POA: Diagnosis not present

## 2023-03-07 DIAGNOSIS — M6281 Muscle weakness (generalized): Secondary | ICD-10-CM | POA: Diagnosis not present

## 2023-03-07 DIAGNOSIS — F411 Generalized anxiety disorder: Secondary | ICD-10-CM | POA: Diagnosis not present

## 2023-03-07 DIAGNOSIS — R41841 Cognitive communication deficit: Secondary | ICD-10-CM | POA: Diagnosis not present

## 2023-03-07 DIAGNOSIS — Z9181 History of falling: Secondary | ICD-10-CM | POA: Diagnosis not present

## 2023-03-07 DIAGNOSIS — R2681 Unsteadiness on feet: Secondary | ICD-10-CM | POA: Diagnosis not present

## 2023-03-07 DIAGNOSIS — R4181 Age-related cognitive decline: Secondary | ICD-10-CM | POA: Diagnosis not present

## 2023-03-07 DIAGNOSIS — F3342 Major depressive disorder, recurrent, in full remission: Secondary | ICD-10-CM | POA: Diagnosis not present

## 2023-03-08 DIAGNOSIS — M6281 Muscle weakness (generalized): Secondary | ICD-10-CM | POA: Diagnosis not present

## 2023-03-09 DIAGNOSIS — R4181 Age-related cognitive decline: Secondary | ICD-10-CM | POA: Diagnosis not present

## 2023-03-09 DIAGNOSIS — M6281 Muscle weakness (generalized): Secondary | ICD-10-CM | POA: Diagnosis not present

## 2023-03-09 DIAGNOSIS — R41841 Cognitive communication deficit: Secondary | ICD-10-CM | POA: Diagnosis not present

## 2023-03-12 DIAGNOSIS — R41841 Cognitive communication deficit: Secondary | ICD-10-CM | POA: Diagnosis not present

## 2023-03-12 DIAGNOSIS — Z9181 History of falling: Secondary | ICD-10-CM | POA: Diagnosis not present

## 2023-03-12 DIAGNOSIS — M6281 Muscle weakness (generalized): Secondary | ICD-10-CM | POA: Diagnosis not present

## 2023-03-12 DIAGNOSIS — R2681 Unsteadiness on feet: Secondary | ICD-10-CM | POA: Diagnosis not present

## 2023-03-12 DIAGNOSIS — R4181 Age-related cognitive decline: Secondary | ICD-10-CM | POA: Diagnosis not present

## 2023-03-13 DIAGNOSIS — M6281 Muscle weakness (generalized): Secondary | ICD-10-CM | POA: Diagnosis not present

## 2023-03-13 DIAGNOSIS — B351 Tinea unguium: Secondary | ICD-10-CM | POA: Diagnosis not present

## 2023-03-13 DIAGNOSIS — E119 Type 2 diabetes mellitus without complications: Secondary | ICD-10-CM | POA: Diagnosis not present

## 2023-03-13 DIAGNOSIS — M79675 Pain in left toe(s): Secondary | ICD-10-CM | POA: Diagnosis not present

## 2023-03-13 DIAGNOSIS — M2041 Other hammer toe(s) (acquired), right foot: Secondary | ICD-10-CM | POA: Diagnosis not present

## 2023-03-18 DIAGNOSIS — Z9181 History of falling: Secondary | ICD-10-CM | POA: Diagnosis not present

## 2023-03-18 DIAGNOSIS — R2681 Unsteadiness on feet: Secondary | ICD-10-CM | POA: Diagnosis not present

## 2023-03-18 DIAGNOSIS — M6281 Muscle weakness (generalized): Secondary | ICD-10-CM | POA: Diagnosis not present

## 2023-03-19 DIAGNOSIS — R2681 Unsteadiness on feet: Secondary | ICD-10-CM | POA: Diagnosis not present

## 2023-03-19 DIAGNOSIS — R41841 Cognitive communication deficit: Secondary | ICD-10-CM | POA: Diagnosis not present

## 2023-03-19 DIAGNOSIS — Z9181 History of falling: Secondary | ICD-10-CM | POA: Diagnosis not present

## 2023-03-19 DIAGNOSIS — R4181 Age-related cognitive decline: Secondary | ICD-10-CM | POA: Diagnosis not present

## 2023-03-19 DIAGNOSIS — M6281 Muscle weakness (generalized): Secondary | ICD-10-CM | POA: Diagnosis not present

## 2023-03-20 DIAGNOSIS — M6281 Muscle weakness (generalized): Secondary | ICD-10-CM | POA: Diagnosis not present

## 2023-03-21 DIAGNOSIS — R2681 Unsteadiness on feet: Secondary | ICD-10-CM | POA: Diagnosis not present

## 2023-03-21 DIAGNOSIS — R41841 Cognitive communication deficit: Secondary | ICD-10-CM | POA: Diagnosis not present

## 2023-03-21 DIAGNOSIS — M6281 Muscle weakness (generalized): Secondary | ICD-10-CM | POA: Diagnosis not present

## 2023-03-21 DIAGNOSIS — R4181 Age-related cognitive decline: Secondary | ICD-10-CM | POA: Diagnosis not present

## 2023-03-21 DIAGNOSIS — Z9181 History of falling: Secondary | ICD-10-CM | POA: Diagnosis not present

## 2023-03-22 DIAGNOSIS — R2681 Unsteadiness on feet: Secondary | ICD-10-CM | POA: Diagnosis not present

## 2023-03-22 DIAGNOSIS — M6281 Muscle weakness (generalized): Secondary | ICD-10-CM | POA: Diagnosis not present

## 2023-03-22 DIAGNOSIS — Z9181 History of falling: Secondary | ICD-10-CM | POA: Diagnosis not present

## 2023-03-23 DIAGNOSIS — R41841 Cognitive communication deficit: Secondary | ICD-10-CM | POA: Diagnosis not present

## 2023-03-23 DIAGNOSIS — R4181 Age-related cognitive decline: Secondary | ICD-10-CM | POA: Diagnosis not present

## 2023-03-25 DIAGNOSIS — M6281 Muscle weakness (generalized): Secondary | ICD-10-CM | POA: Diagnosis not present

## 2023-03-26 DIAGNOSIS — R4181 Age-related cognitive decline: Secondary | ICD-10-CM | POA: Diagnosis not present

## 2023-03-26 DIAGNOSIS — Z9181 History of falling: Secondary | ICD-10-CM | POA: Diagnosis not present

## 2023-03-26 DIAGNOSIS — R41841 Cognitive communication deficit: Secondary | ICD-10-CM | POA: Diagnosis not present

## 2023-03-26 DIAGNOSIS — R2681 Unsteadiness on feet: Secondary | ICD-10-CM | POA: Diagnosis not present

## 2023-03-26 DIAGNOSIS — M6281 Muscle weakness (generalized): Secondary | ICD-10-CM | POA: Diagnosis not present

## 2023-03-28 DIAGNOSIS — M6281 Muscle weakness (generalized): Secondary | ICD-10-CM | POA: Diagnosis not present

## 2023-03-28 DIAGNOSIS — Z9181 History of falling: Secondary | ICD-10-CM | POA: Diagnosis not present

## 2023-03-28 DIAGNOSIS — R2681 Unsteadiness on feet: Secondary | ICD-10-CM | POA: Diagnosis not present

## 2023-03-28 DIAGNOSIS — R41841 Cognitive communication deficit: Secondary | ICD-10-CM | POA: Diagnosis not present

## 2023-03-28 DIAGNOSIS — R4181 Age-related cognitive decline: Secondary | ICD-10-CM | POA: Diagnosis not present

## 2023-03-30 DIAGNOSIS — R2681 Unsteadiness on feet: Secondary | ICD-10-CM | POA: Diagnosis not present

## 2023-03-30 DIAGNOSIS — M6281 Muscle weakness (generalized): Secondary | ICD-10-CM | POA: Diagnosis not present

## 2023-03-30 DIAGNOSIS — Z9181 History of falling: Secondary | ICD-10-CM | POA: Diagnosis not present

## 2023-04-01 DIAGNOSIS — M6281 Muscle weakness (generalized): Secondary | ICD-10-CM | POA: Diagnosis not present

## 2023-04-02 DIAGNOSIS — R41841 Cognitive communication deficit: Secondary | ICD-10-CM | POA: Diagnosis not present

## 2023-04-02 DIAGNOSIS — R4181 Age-related cognitive decline: Secondary | ICD-10-CM | POA: Diagnosis not present

## 2023-04-02 DIAGNOSIS — R2681 Unsteadiness on feet: Secondary | ICD-10-CM | POA: Diagnosis not present

## 2023-04-02 DIAGNOSIS — M6281 Muscle weakness (generalized): Secondary | ICD-10-CM | POA: Diagnosis not present

## 2023-04-02 DIAGNOSIS — Z9181 History of falling: Secondary | ICD-10-CM | POA: Diagnosis not present

## 2023-04-03 DIAGNOSIS — R4181 Age-related cognitive decline: Secondary | ICD-10-CM | POA: Diagnosis not present

## 2023-04-03 DIAGNOSIS — R41841 Cognitive communication deficit: Secondary | ICD-10-CM | POA: Diagnosis not present

## 2023-04-03 DIAGNOSIS — M6281 Muscle weakness (generalized): Secondary | ICD-10-CM | POA: Diagnosis not present

## 2023-04-04 DIAGNOSIS — R41841 Cognitive communication deficit: Secondary | ICD-10-CM | POA: Diagnosis not present

## 2023-04-04 DIAGNOSIS — R4181 Age-related cognitive decline: Secondary | ICD-10-CM | POA: Diagnosis not present

## 2023-04-05 DIAGNOSIS — R2681 Unsteadiness on feet: Secondary | ICD-10-CM | POA: Diagnosis not present

## 2023-04-05 DIAGNOSIS — Z9181 History of falling: Secondary | ICD-10-CM | POA: Diagnosis not present

## 2023-04-05 DIAGNOSIS — M6281 Muscle weakness (generalized): Secondary | ICD-10-CM | POA: Diagnosis not present

## 2023-04-06 DIAGNOSIS — R2681 Unsteadiness on feet: Secondary | ICD-10-CM | POA: Diagnosis not present

## 2023-04-06 DIAGNOSIS — M6281 Muscle weakness (generalized): Secondary | ICD-10-CM | POA: Diagnosis not present

## 2023-04-06 DIAGNOSIS — Z9181 History of falling: Secondary | ICD-10-CM | POA: Diagnosis not present

## 2023-04-09 DIAGNOSIS — Z9181 History of falling: Secondary | ICD-10-CM | POA: Diagnosis not present

## 2023-04-09 DIAGNOSIS — M6281 Muscle weakness (generalized): Secondary | ICD-10-CM | POA: Diagnosis not present

## 2023-04-09 DIAGNOSIS — R2681 Unsteadiness on feet: Secondary | ICD-10-CM | POA: Diagnosis not present

## 2023-04-10 DIAGNOSIS — E039 Hypothyroidism, unspecified: Secondary | ICD-10-CM | POA: Diagnosis not present

## 2023-04-10 DIAGNOSIS — I700 Atherosclerosis of aorta: Secondary | ICD-10-CM | POA: Diagnosis not present

## 2023-04-10 DIAGNOSIS — E119 Type 2 diabetes mellitus without complications: Secondary | ICD-10-CM | POA: Diagnosis not present

## 2023-04-10 DIAGNOSIS — Z9989 Dependence on other enabling machines and devices: Secondary | ICD-10-CM | POA: Diagnosis not present

## 2023-04-10 DIAGNOSIS — F321 Major depressive disorder, single episode, moderate: Secondary | ICD-10-CM | POA: Diagnosis not present

## 2023-04-11 DIAGNOSIS — Z9181 History of falling: Secondary | ICD-10-CM | POA: Diagnosis not present

## 2023-04-11 DIAGNOSIS — M6281 Muscle weakness (generalized): Secondary | ICD-10-CM | POA: Diagnosis not present

## 2023-04-11 DIAGNOSIS — R2681 Unsteadiness on feet: Secondary | ICD-10-CM | POA: Diagnosis not present

## 2023-04-12 DIAGNOSIS — M6281 Muscle weakness (generalized): Secondary | ICD-10-CM | POA: Diagnosis not present

## 2023-04-15 DIAGNOSIS — R2681 Unsteadiness on feet: Secondary | ICD-10-CM | POA: Diagnosis not present

## 2023-04-15 DIAGNOSIS — M6281 Muscle weakness (generalized): Secondary | ICD-10-CM | POA: Diagnosis not present

## 2023-04-15 DIAGNOSIS — Z9181 History of falling: Secondary | ICD-10-CM | POA: Diagnosis not present

## 2023-04-16 DIAGNOSIS — Z9181 History of falling: Secondary | ICD-10-CM | POA: Diagnosis not present

## 2023-04-16 DIAGNOSIS — M6281 Muscle weakness (generalized): Secondary | ICD-10-CM | POA: Diagnosis not present

## 2023-04-16 DIAGNOSIS — R2681 Unsteadiness on feet: Secondary | ICD-10-CM | POA: Diagnosis not present

## 2023-04-18 DIAGNOSIS — R2681 Unsteadiness on feet: Secondary | ICD-10-CM | POA: Diagnosis not present

## 2023-04-18 DIAGNOSIS — Z9181 History of falling: Secondary | ICD-10-CM | POA: Diagnosis not present

## 2023-04-18 DIAGNOSIS — M6281 Muscle weakness (generalized): Secondary | ICD-10-CM | POA: Diagnosis not present

## 2023-04-20 DIAGNOSIS — M6281 Muscle weakness (generalized): Secondary | ICD-10-CM | POA: Diagnosis not present

## 2023-04-21 DIAGNOSIS — Z9181 History of falling: Secondary | ICD-10-CM | POA: Diagnosis not present

## 2023-04-21 DIAGNOSIS — R2681 Unsteadiness on feet: Secondary | ICD-10-CM | POA: Diagnosis not present

## 2023-04-21 DIAGNOSIS — M6281 Muscle weakness (generalized): Secondary | ICD-10-CM | POA: Diagnosis not present

## 2023-04-22 DIAGNOSIS — M6281 Muscle weakness (generalized): Secondary | ICD-10-CM | POA: Diagnosis not present

## 2023-04-23 DIAGNOSIS — M6281 Muscle weakness (generalized): Secondary | ICD-10-CM | POA: Diagnosis not present

## 2023-04-23 DIAGNOSIS — R2681 Unsteadiness on feet: Secondary | ICD-10-CM | POA: Diagnosis not present

## 2023-04-23 DIAGNOSIS — Z9181 History of falling: Secondary | ICD-10-CM | POA: Diagnosis not present

## 2023-04-24 DIAGNOSIS — M6281 Muscle weakness (generalized): Secondary | ICD-10-CM | POA: Diagnosis not present

## 2023-04-24 DIAGNOSIS — R2681 Unsteadiness on feet: Secondary | ICD-10-CM | POA: Diagnosis not present

## 2023-04-24 DIAGNOSIS — Z9181 History of falling: Secondary | ICD-10-CM | POA: Diagnosis not present

## 2023-04-25 DIAGNOSIS — Z9181 History of falling: Secondary | ICD-10-CM | POA: Diagnosis not present

## 2023-04-25 DIAGNOSIS — M6281 Muscle weakness (generalized): Secondary | ICD-10-CM | POA: Diagnosis not present

## 2023-04-25 DIAGNOSIS — R2681 Unsteadiness on feet: Secondary | ICD-10-CM | POA: Diagnosis not present

## 2023-04-27 DIAGNOSIS — M6281 Muscle weakness (generalized): Secondary | ICD-10-CM | POA: Diagnosis not present

## 2023-04-30 DIAGNOSIS — R2681 Unsteadiness on feet: Secondary | ICD-10-CM | POA: Diagnosis not present

## 2023-04-30 DIAGNOSIS — Z9181 History of falling: Secondary | ICD-10-CM | POA: Diagnosis not present

## 2023-04-30 DIAGNOSIS — M6281 Muscle weakness (generalized): Secondary | ICD-10-CM | POA: Diagnosis not present

## 2023-05-01 DIAGNOSIS — M6281 Muscle weakness (generalized): Secondary | ICD-10-CM | POA: Diagnosis not present

## 2023-05-03 DIAGNOSIS — M6281 Muscle weakness (generalized): Secondary | ICD-10-CM | POA: Diagnosis not present

## 2023-05-04 DIAGNOSIS — R2681 Unsteadiness on feet: Secondary | ICD-10-CM | POA: Diagnosis not present

## 2023-05-04 DIAGNOSIS — Z9181 History of falling: Secondary | ICD-10-CM | POA: Diagnosis not present

## 2023-05-04 DIAGNOSIS — M6281 Muscle weakness (generalized): Secondary | ICD-10-CM | POA: Diagnosis not present

## 2023-05-05 DIAGNOSIS — M6281 Muscle weakness (generalized): Secondary | ICD-10-CM | POA: Diagnosis not present

## 2023-05-08 DIAGNOSIS — Z9181 History of falling: Secondary | ICD-10-CM | POA: Diagnosis not present

## 2023-05-08 DIAGNOSIS — M6281 Muscle weakness (generalized): Secondary | ICD-10-CM | POA: Diagnosis not present

## 2023-05-08 DIAGNOSIS — R2681 Unsteadiness on feet: Secondary | ICD-10-CM | POA: Diagnosis not present

## 2023-05-09 DIAGNOSIS — Z9181 History of falling: Secondary | ICD-10-CM | POA: Diagnosis not present

## 2023-05-09 DIAGNOSIS — R2681 Unsteadiness on feet: Secondary | ICD-10-CM | POA: Diagnosis not present

## 2023-05-09 DIAGNOSIS — M6281 Muscle weakness (generalized): Secondary | ICD-10-CM | POA: Diagnosis not present

## 2023-05-10 DIAGNOSIS — M6281 Muscle weakness (generalized): Secondary | ICD-10-CM | POA: Diagnosis not present

## 2023-05-11 DIAGNOSIS — M6281 Muscle weakness (generalized): Secondary | ICD-10-CM | POA: Diagnosis not present

## 2023-05-11 DIAGNOSIS — Z9181 History of falling: Secondary | ICD-10-CM | POA: Diagnosis not present

## 2023-05-11 DIAGNOSIS — R2681 Unsteadiness on feet: Secondary | ICD-10-CM | POA: Diagnosis not present

## 2023-05-14 DIAGNOSIS — Z9181 History of falling: Secondary | ICD-10-CM | POA: Diagnosis not present

## 2023-05-14 DIAGNOSIS — R2681 Unsteadiness on feet: Secondary | ICD-10-CM | POA: Diagnosis not present

## 2023-05-14 DIAGNOSIS — M6281 Muscle weakness (generalized): Secondary | ICD-10-CM | POA: Diagnosis not present

## 2023-05-15 DIAGNOSIS — M6281 Muscle weakness (generalized): Secondary | ICD-10-CM | POA: Diagnosis not present

## 2023-05-16 DIAGNOSIS — R2681 Unsteadiness on feet: Secondary | ICD-10-CM | POA: Diagnosis not present

## 2023-05-16 DIAGNOSIS — M6281 Muscle weakness (generalized): Secondary | ICD-10-CM | POA: Diagnosis not present

## 2023-05-16 DIAGNOSIS — Z9181 History of falling: Secondary | ICD-10-CM | POA: Diagnosis not present

## 2023-05-17 DIAGNOSIS — M6281 Muscle weakness (generalized): Secondary | ICD-10-CM | POA: Diagnosis not present

## 2023-05-18 DIAGNOSIS — R2681 Unsteadiness on feet: Secondary | ICD-10-CM | POA: Diagnosis not present

## 2023-05-18 DIAGNOSIS — M6281 Muscle weakness (generalized): Secondary | ICD-10-CM | POA: Diagnosis not present

## 2023-05-18 DIAGNOSIS — Z9181 History of falling: Secondary | ICD-10-CM | POA: Diagnosis not present

## 2023-05-20 DIAGNOSIS — M6281 Muscle weakness (generalized): Secondary | ICD-10-CM | POA: Diagnosis not present

## 2023-05-21 DIAGNOSIS — R2681 Unsteadiness on feet: Secondary | ICD-10-CM | POA: Diagnosis not present

## 2023-05-21 DIAGNOSIS — M6281 Muscle weakness (generalized): Secondary | ICD-10-CM | POA: Diagnosis not present

## 2023-05-21 DIAGNOSIS — Z9181 History of falling: Secondary | ICD-10-CM | POA: Diagnosis not present

## 2023-05-23 DIAGNOSIS — M6281 Muscle weakness (generalized): Secondary | ICD-10-CM | POA: Diagnosis not present

## 2023-05-23 DIAGNOSIS — Z9181 History of falling: Secondary | ICD-10-CM | POA: Diagnosis not present

## 2023-05-23 DIAGNOSIS — R2681 Unsteadiness on feet: Secondary | ICD-10-CM | POA: Diagnosis not present

## 2023-05-24 DIAGNOSIS — M6281 Muscle weakness (generalized): Secondary | ICD-10-CM | POA: Diagnosis not present

## 2023-05-24 DIAGNOSIS — Z9181 History of falling: Secondary | ICD-10-CM | POA: Diagnosis not present

## 2023-05-24 DIAGNOSIS — R2681 Unsteadiness on feet: Secondary | ICD-10-CM | POA: Diagnosis not present

## 2023-05-28 ENCOUNTER — Emergency Department (HOSPITAL_COMMUNITY): Payer: Medicare HMO

## 2023-05-28 ENCOUNTER — Encounter (HOSPITAL_COMMUNITY)
Admission: EM | Disposition: A | Discharge status: Skilled Nursing Facility | Payer: Self-pay | Source: Skilled Nursing Facility | Attending: Internal Medicine

## 2023-05-28 ENCOUNTER — Encounter (HOSPITAL_COMMUNITY): Payer: Self-pay | Admitting: Emergency Medicine

## 2023-05-28 ENCOUNTER — Emergency Department (HOSPITAL_COMMUNITY): Payer: Medicare HMO | Admitting: Anesthesiology

## 2023-05-28 ENCOUNTER — Inpatient Hospital Stay (HOSPITAL_COMMUNITY): Payer: Medicare HMO

## 2023-05-28 ENCOUNTER — Inpatient Hospital Stay (HOSPITAL_COMMUNITY)
Admission: EM | Admit: 2023-05-28 | Discharge: 2023-06-11 | DRG: 853 | Disposition: A | Discharge status: Skilled Nursing Facility | Payer: Medicare HMO | Source: Skilled Nursing Facility | Attending: Internal Medicine | Admitting: Internal Medicine

## 2023-05-28 DIAGNOSIS — Z9049 Acquired absence of other specified parts of digestive tract: Secondary | ICD-10-CM

## 2023-05-28 DIAGNOSIS — N136 Pyonephrosis: Secondary | ICD-10-CM | POA: Diagnosis present

## 2023-05-28 DIAGNOSIS — A4151 Sepsis due to Escherichia coli [E. coli]: Secondary | ICD-10-CM | POA: Diagnosis not present

## 2023-05-28 DIAGNOSIS — Z79890 Hormone replacement therapy (postmenopausal): Secondary | ICD-10-CM | POA: Diagnosis not present

## 2023-05-28 DIAGNOSIS — Z7984 Long term (current) use of oral hypoglycemic drugs: Secondary | ICD-10-CM

## 2023-05-28 DIAGNOSIS — A419 Sepsis, unspecified organism: Secondary | ICD-10-CM | POA: Diagnosis present

## 2023-05-28 DIAGNOSIS — E119 Type 2 diabetes mellitus without complications: Secondary | ICD-10-CM | POA: Diagnosis not present

## 2023-05-28 DIAGNOSIS — B9620 Unspecified Escherichia coli [E. coli] as the cause of diseases classified elsewhere: Secondary | ICD-10-CM | POA: Diagnosis not present

## 2023-05-28 DIAGNOSIS — G9340 Encephalopathy, unspecified: Secondary | ICD-10-CM

## 2023-05-28 DIAGNOSIS — R509 Fever, unspecified: Secondary | ICD-10-CM | POA: Diagnosis not present

## 2023-05-28 DIAGNOSIS — N12 Tubulo-interstitial nephritis, not specified as acute or chronic: Secondary | ICD-10-CM

## 2023-05-28 DIAGNOSIS — Z466 Encounter for fitting and adjustment of urinary device: Secondary | ICD-10-CM | POA: Diagnosis not present

## 2023-05-28 DIAGNOSIS — R7881 Bacteremia: Secondary | ICD-10-CM | POA: Diagnosis not present

## 2023-05-28 DIAGNOSIS — R0989 Other specified symptoms and signs involving the circulatory and respiratory systems: Secondary | ICD-10-CM | POA: Diagnosis not present

## 2023-05-28 DIAGNOSIS — G9341 Metabolic encephalopathy: Secondary | ICD-10-CM | POA: Diagnosis not present

## 2023-05-28 DIAGNOSIS — I959 Hypotension, unspecified: Secondary | ICD-10-CM | POA: Diagnosis not present

## 2023-05-28 DIAGNOSIS — E785 Hyperlipidemia, unspecified: Secondary | ICD-10-CM | POA: Diagnosis present

## 2023-05-28 DIAGNOSIS — N200 Calculus of kidney: Secondary | ICD-10-CM | POA: Diagnosis not present

## 2023-05-28 DIAGNOSIS — I341 Nonrheumatic mitral (valve) prolapse: Secondary | ICD-10-CM | POA: Diagnosis not present

## 2023-05-28 DIAGNOSIS — F418 Other specified anxiety disorders: Secondary | ICD-10-CM | POA: Diagnosis not present

## 2023-05-28 DIAGNOSIS — Z7982 Long term (current) use of aspirin: Secondary | ICD-10-CM

## 2023-05-28 DIAGNOSIS — F32A Depression, unspecified: Secondary | ICD-10-CM | POA: Diagnosis not present

## 2023-05-28 DIAGNOSIS — Z0389 Encounter for observation for other suspected diseases and conditions ruled out: Secondary | ICD-10-CM | POA: Diagnosis not present

## 2023-05-28 DIAGNOSIS — N132 Hydronephrosis with renal and ureteral calculous obstruction: Secondary | ICD-10-CM | POA: Diagnosis not present

## 2023-05-28 DIAGNOSIS — R6521 Severe sepsis with septic shock: Secondary | ICD-10-CM | POA: Diagnosis not present

## 2023-05-28 DIAGNOSIS — Z833 Family history of diabetes mellitus: Secondary | ICD-10-CM

## 2023-05-28 DIAGNOSIS — Z8249 Family history of ischemic heart disease and other diseases of the circulatory system: Secondary | ICD-10-CM | POA: Diagnosis not present

## 2023-05-28 DIAGNOSIS — N151 Renal and perinephric abscess: Secondary | ICD-10-CM | POA: Diagnosis not present

## 2023-05-28 DIAGNOSIS — R918 Other nonspecific abnormal finding of lung field: Secondary | ICD-10-CM | POA: Diagnosis not present

## 2023-05-28 DIAGNOSIS — N170 Acute kidney failure with tubular necrosis: Secondary | ICD-10-CM | POA: Diagnosis not present

## 2023-05-28 DIAGNOSIS — Z8261 Family history of arthritis: Secondary | ICD-10-CM

## 2023-05-28 DIAGNOSIS — E039 Hypothyroidism, unspecified: Secondary | ICD-10-CM | POA: Diagnosis not present

## 2023-05-28 DIAGNOSIS — M6259 Muscle wasting and atrophy, not elsewhere classified, multiple sites: Secondary | ICD-10-CM | POA: Diagnosis not present

## 2023-05-28 DIAGNOSIS — Z8614 Personal history of Methicillin resistant Staphylococcus aureus infection: Secondary | ICD-10-CM

## 2023-05-28 DIAGNOSIS — E875 Hyperkalemia: Secondary | ICD-10-CM | POA: Diagnosis not present

## 2023-05-28 DIAGNOSIS — E663 Overweight: Secondary | ICD-10-CM | POA: Diagnosis present

## 2023-05-28 DIAGNOSIS — J9811 Atelectasis: Secondary | ICD-10-CM | POA: Diagnosis not present

## 2023-05-28 DIAGNOSIS — R404 Transient alteration of awareness: Secondary | ICD-10-CM | POA: Diagnosis not present

## 2023-05-28 DIAGNOSIS — Z452 Encounter for adjustment and management of vascular access device: Secondary | ICD-10-CM | POA: Diagnosis not present

## 2023-05-28 DIAGNOSIS — E871 Hypo-osmolality and hyponatremia: Secondary | ICD-10-CM | POA: Diagnosis present

## 2023-05-28 DIAGNOSIS — R9431 Abnormal electrocardiogram [ECG] [EKG]: Secondary | ICD-10-CM | POA: Diagnosis not present

## 2023-05-28 DIAGNOSIS — R0689 Other abnormalities of breathing: Secondary | ICD-10-CM | POA: Diagnosis not present

## 2023-05-28 DIAGNOSIS — K838 Other specified diseases of biliary tract: Secondary | ICD-10-CM | POA: Diagnosis not present

## 2023-05-28 DIAGNOSIS — Z818 Family history of other mental and behavioral disorders: Secondary | ICD-10-CM

## 2023-05-28 DIAGNOSIS — K8020 Calculus of gallbladder without cholecystitis without obstruction: Secondary | ICD-10-CM | POA: Diagnosis not present

## 2023-05-28 DIAGNOSIS — D6959 Other secondary thrombocytopenia: Secondary | ICD-10-CM | POA: Diagnosis present

## 2023-05-28 DIAGNOSIS — N179 Acute kidney failure, unspecified: Principal | ICD-10-CM

## 2023-05-28 DIAGNOSIS — E1110 Type 2 diabetes mellitus with ketoacidosis without coma: Secondary | ICD-10-CM | POA: Diagnosis not present

## 2023-05-28 DIAGNOSIS — F209 Schizophrenia, unspecified: Secondary | ICD-10-CM | POA: Diagnosis not present

## 2023-05-28 DIAGNOSIS — D75839 Thrombocytosis, unspecified: Secondary | ICD-10-CM | POA: Diagnosis present

## 2023-05-28 DIAGNOSIS — I480 Paroxysmal atrial fibrillation: Secondary | ICD-10-CM | POA: Diagnosis present

## 2023-05-28 DIAGNOSIS — N201 Calculus of ureter: Secondary | ICD-10-CM

## 2023-05-28 DIAGNOSIS — Z1152 Encounter for screening for COVID-19: Secondary | ICD-10-CM

## 2023-05-28 DIAGNOSIS — R935 Abnormal findings on diagnostic imaging of other abdominal regions, including retroperitoneum: Secondary | ICD-10-CM | POA: Diagnosis not present

## 2023-05-28 DIAGNOSIS — E876 Hypokalemia: Secondary | ICD-10-CM | POA: Diagnosis not present

## 2023-05-28 DIAGNOSIS — N390 Urinary tract infection, site not specified: Secondary | ICD-10-CM | POA: Diagnosis not present

## 2023-05-28 DIAGNOSIS — I4891 Unspecified atrial fibrillation: Secondary | ICD-10-CM | POA: Diagnosis not present

## 2023-05-28 DIAGNOSIS — R0902 Hypoxemia: Secondary | ICD-10-CM | POA: Diagnosis not present

## 2023-05-28 DIAGNOSIS — D849 Immunodeficiency, unspecified: Secondary | ICD-10-CM | POA: Diagnosis not present

## 2023-05-28 DIAGNOSIS — R531 Weakness: Secondary | ICD-10-CM | POA: Diagnosis not present

## 2023-05-28 DIAGNOSIS — Z905 Acquired absence of kidney: Secondary | ICD-10-CM

## 2023-05-28 DIAGNOSIS — Z79899 Other long term (current) drug therapy: Secondary | ICD-10-CM

## 2023-05-28 DIAGNOSIS — I700 Atherosclerosis of aorta: Secondary | ICD-10-CM | POA: Diagnosis not present

## 2023-05-28 DIAGNOSIS — F419 Anxiety disorder, unspecified: Secondary | ICD-10-CM | POA: Diagnosis not present

## 2023-05-28 DIAGNOSIS — I10 Essential (primary) hypertension: Secondary | ICD-10-CM | POA: Diagnosis present

## 2023-05-28 DIAGNOSIS — R739 Hyperglycemia, unspecified: Secondary | ICD-10-CM | POA: Diagnosis not present

## 2023-05-28 DIAGNOSIS — N2884 Pyelitis cystica: Secondary | ICD-10-CM | POA: Diagnosis not present

## 2023-05-28 DIAGNOSIS — Z7401 Bed confinement status: Secondary | ICD-10-CM | POA: Diagnosis not present

## 2023-05-28 DIAGNOSIS — J811 Chronic pulmonary edema: Secondary | ICD-10-CM | POA: Diagnosis not present

## 2023-05-28 DIAGNOSIS — Z882 Allergy status to sulfonamides status: Secondary | ICD-10-CM

## 2023-05-28 DIAGNOSIS — Z4682 Encounter for fitting and adjustment of non-vascular catheter: Secondary | ICD-10-CM | POA: Diagnosis not present

## 2023-05-28 DIAGNOSIS — R6520 Severe sepsis without septic shock: Secondary | ICD-10-CM | POA: Diagnosis not present

## 2023-05-28 DIAGNOSIS — N3080 Other cystitis without hematuria: Secondary | ICD-10-CM | POA: Diagnosis not present

## 2023-05-28 DIAGNOSIS — M6281 Muscle weakness (generalized): Secondary | ICD-10-CM | POA: Diagnosis not present

## 2023-05-28 HISTORY — PX: CYSTOSCOPY W/ URETERAL STENT PLACEMENT: SHX1429

## 2023-05-28 LAB — CBC WITH DIFFERENTIAL/PLATELET
Abs Immature Granulocytes: 0.11 10*3/uL — ABNORMAL HIGH (ref 0.00–0.07)
Basophils Absolute: 0.1 10*3/uL (ref 0.0–0.1)
Basophils Relative: 1 %
Eosinophils Absolute: 0.6 10*3/uL — ABNORMAL HIGH (ref 0.0–0.5)
Eosinophils Relative: 9 %
HCT: 46.7 % — ABNORMAL HIGH (ref 36.0–46.0)
Hemoglobin: 15.4 g/dL — ABNORMAL HIGH (ref 12.0–15.0)
Immature Granulocytes: 2 %
Lymphocytes Relative: 5 %
Lymphs Abs: 0.4 10*3/uL — ABNORMAL LOW (ref 0.7–4.0)
MCH: 29.3 pg (ref 26.0–34.0)
MCHC: 33 g/dL (ref 30.0–36.0)
MCV: 88.8 fL (ref 80.0–100.0)
Monocytes Absolute: 0.2 10*3/uL (ref 0.1–1.0)
Monocytes Relative: 3 %
Neutro Abs: 5.4 10*3/uL (ref 1.7–7.7)
Neutrophils Relative %: 80 %
Platelets: 121 10*3/uL — ABNORMAL LOW (ref 150–400)
RBC: 5.26 MIL/uL — ABNORMAL HIGH (ref 3.87–5.11)
RDW: 12.8 % (ref 11.5–15.5)
WBC: 6.7 10*3/uL (ref 4.0–10.5)
nRBC: 0 % (ref 0.0–0.2)

## 2023-05-28 LAB — POCT I-STAT 7, (LYTES, BLD GAS, ICA,H+H)
Acid-base deficit: 10 mmol/L — ABNORMAL HIGH (ref 0.0–2.0)
Bicarbonate: 16.1 mmol/L — ABNORMAL LOW (ref 20.0–28.0)
Calcium, Ion: 1.15 mmol/L (ref 1.15–1.40)
HCT: 36 % (ref 36.0–46.0)
Hemoglobin: 12.2 g/dL (ref 12.0–15.0)
O2 Saturation: 100 %
Patient temperature: 36
Potassium: 4 mmol/L (ref 3.5–5.1)
Sodium: 131 mmol/L — ABNORMAL LOW (ref 135–145)
TCO2: 17 mmol/L — ABNORMAL LOW (ref 22–32)
pCO2 arterial: 35.3 mm[Hg] (ref 32–48)
pH, Arterial: 7.263 — ABNORMAL LOW (ref 7.35–7.45)
pO2, Arterial: 369 mm[Hg] — ABNORMAL HIGH (ref 83–108)

## 2023-05-28 LAB — I-STAT CHEM 8, ED
BUN: 32 mg/dL — ABNORMAL HIGH (ref 8–23)
Calcium, Ion: 1.05 mmol/L — ABNORMAL LOW (ref 1.15–1.40)
Chloride: 95 mmol/L — ABNORMAL LOW (ref 98–111)
Creatinine, Ser: 2.3 mg/dL — ABNORMAL HIGH (ref 0.44–1.00)
Glucose, Bld: 495 mg/dL — ABNORMAL HIGH (ref 70–99)
HCT: 49 % — ABNORMAL HIGH (ref 36.0–46.0)
Hemoglobin: 16.7 g/dL — ABNORMAL HIGH (ref 12.0–15.0)
Potassium: 4.9 mmol/L (ref 3.5–5.1)
Sodium: 129 mmol/L — ABNORMAL LOW (ref 135–145)
TCO2: 19 mmol/L — ABNORMAL LOW (ref 22–32)

## 2023-05-28 LAB — POCT I-STAT, CHEM 8
BUN: 28 mg/dL — ABNORMAL HIGH (ref 8–23)
Calcium, Ion: 1.15 mmol/L (ref 1.15–1.40)
Chloride: 97 mmol/L — ABNORMAL LOW (ref 98–111)
Creatinine, Ser: 1.8 mg/dL — ABNORMAL HIGH (ref 0.44–1.00)
Glucose, Bld: 429 mg/dL — ABNORMAL HIGH (ref 70–99)
HCT: 36 % (ref 36.0–46.0)
Hemoglobin: 12.2 g/dL (ref 12.0–15.0)
Potassium: 4 mmol/L (ref 3.5–5.1)
Sodium: 132 mmol/L — ABNORMAL LOW (ref 135–145)
TCO2: 18 mmol/L — ABNORMAL LOW (ref 22–32)

## 2023-05-28 LAB — COMPREHENSIVE METABOLIC PANEL
ALT: 40 U/L (ref 0–44)
AST: 49 U/L — ABNORMAL HIGH (ref 15–41)
Albumin: 3.3 g/dL — ABNORMAL LOW (ref 3.5–5.0)
Alkaline Phosphatase: 57 U/L (ref 38–126)
Anion gap: 20 — ABNORMAL HIGH (ref 5–15)
BUN: 32 mg/dL — ABNORMAL HIGH (ref 8–23)
CO2: 14 mmol/L — ABNORMAL LOW (ref 22–32)
Calcium: 8.5 mg/dL — ABNORMAL LOW (ref 8.9–10.3)
Chloride: 92 mmol/L — ABNORMAL LOW (ref 98–111)
Creatinine, Ser: 2.48 mg/dL — ABNORMAL HIGH (ref 0.44–1.00)
GFR, Estimated: 20 mL/min — ABNORMAL LOW (ref >60)
Glucose, Bld: 501 mg/dL (ref 70–99)
Potassium: 5.1 mmol/L (ref 3.5–5.1)
Sodium: 126 mmol/L — ABNORMAL LOW (ref 135–145)
Total Bilirubin: 1.5 mg/dL — ABNORMAL HIGH (ref 0.0–1.2)
Total Protein: 6.5 g/dL (ref 6.5–8.1)

## 2023-05-28 LAB — URINALYSIS, W/ REFLEX TO CULTURE (INFECTION SUSPECTED)
Bilirubin Urine: NEGATIVE
Glucose, UA: >=500 mg/dL — AB
Ketones, ur: 5 mg/dL — AB
Nitrite: NEGATIVE
Protein, ur: 100 mg/dL — AB
Specific Gravity, Urine: 1.02 (ref 1.005–1.030)
WBC, UA: >50 WBC/hpf (ref 0–5)
pH: 5 (ref 5.0–8.0)

## 2023-05-28 LAB — MAGNESIUM: Magnesium: 1.4 mg/dL — ABNORMAL LOW (ref 1.7–2.4)

## 2023-05-28 LAB — I-STAT CG4 LACTIC ACID, ED
Lactic Acid, Venous: 5.5 mmol/L (ref 0.5–1.9)
Lactic Acid, Venous: 6.5 mmol/L (ref 0.5–1.9)

## 2023-05-28 LAB — RESP PANEL BY RT-PCR (RSV, FLU A&B, COVID)  RVPGX2
Influenza A by PCR: NEGATIVE
Influenza B by PCR: NEGATIVE
Resp Syncytial Virus by PCR: NEGATIVE
SARS Coronavirus 2 by RT PCR: NEGATIVE

## 2023-05-28 LAB — BETA-HYDROXYBUTYRIC ACID: Beta-Hydroxybutyric Acid: 1.19 mmol/L — ABNORMAL HIGH (ref 0.05–0.27)

## 2023-05-28 LAB — PROTIME-INR
INR: 1.4 — ABNORMAL HIGH (ref 0.8–1.2)
Prothrombin Time: 17.4 s — ABNORMAL HIGH (ref 11.4–15.2)

## 2023-05-28 LAB — GLUCOSE, CAPILLARY: Glucose-Capillary: 338 mg/dL — ABNORMAL HIGH (ref 70–99)

## 2023-05-28 LAB — DIGOXIN LEVEL: Digoxin Level: 1.3 ng/mL (ref 0.8–2.0)

## 2023-05-28 LAB — ABO/RH: ABO/RH(D): O POS

## 2023-05-28 LAB — AMMONIA: Ammonia: 19 umol/L (ref 9–35)

## 2023-05-28 LAB — TROPONIN I (HIGH SENSITIVITY)
Troponin I (High Sensitivity): 34 ng/L — ABNORMAL HIGH (ref <18)
Troponin I (High Sensitivity): 38 ng/L — ABNORMAL HIGH (ref <18)

## 2023-05-28 LAB — CK: Total CK: 150 U/L (ref 38–234)

## 2023-05-28 LAB — OSMOLALITY: Osmolality: 309 mosm/kg — ABNORMAL HIGH (ref 275–295)

## 2023-05-28 LAB — TSH: TSH: 4.121 u[IU]/mL (ref 0.350–4.500)

## 2023-05-28 LAB — BRAIN NATRIURETIC PEPTIDE: B Natriuretic Peptide: 89.3 pg/mL (ref 0.0–100.0)

## 2023-05-28 SURGERY — CYSTOSCOPY, WITH RETROGRADE PYELOGRAM AND URETERAL STENT INSERTION
Anesthesia: General | Laterality: Left

## 2023-05-28 MED ORDER — LACTATED RINGERS IV BOLUS
1000.0000 mL | Freq: Once | INTRAVENOUS | Status: AC
  Administered 2023-05-28: 1000 mL via INTRAVENOUS

## 2023-05-28 MED ORDER — ORAL CARE MOUTH RINSE
15.0000 mL | OROMUCOSAL | Status: DC | PRN
Start: 2023-05-28 — End: 2023-05-30

## 2023-05-28 MED ORDER — ACETAMINOPHEN 325 MG PO TABS
650.0000 mg | ORAL_TABLET | Freq: Four times a day (QID) | ORAL | Status: DC | PRN
  Administered 2023-05-29 (×3): 650 mg
  Filled 2023-05-28 (×3): qty 2

## 2023-05-28 MED ORDER — ORAL CARE MOUTH RINSE
15.0000 mL | OROMUCOSAL | Status: DC
  Administered 2023-05-28 – 2023-05-30 (×18): 15 mL via OROMUCOSAL

## 2023-05-28 MED ORDER — POLYETHYLENE GLYCOL 3350 17 G PO PACK: 17.0000 g | PACK | Freq: Every day | ORAL | Status: DC | PRN

## 2023-05-28 MED ORDER — PROPOFOL 10 MG/ML IV BOLUS
INTRAVENOUS | Status: AC
  Filled 2023-05-28: qty 20

## 2023-05-28 MED ORDER — VANCOMYCIN HCL 1.5 G IV SOLR
1500.0000 mg | Freq: Once | INTRAVENOUS | Status: DC
  Filled 2023-05-28: qty 30

## 2023-05-28 MED ORDER — DOCUSATE SODIUM 100 MG PO CAPS: 100.0000 mg | ORAL_CAPSULE | Freq: Two times a day (BID) | ORAL | Status: DC | PRN

## 2023-05-28 MED ORDER — NOREPINEPHRINE 4 MG/250ML-% IV SOLN
0.0000 ug/min | INTRAVENOUS | Status: DC
  Filled 2023-05-28: qty 250

## 2023-05-28 MED ORDER — INSULIN REGULAR(HUMAN) IN NACL 100-0.9 UT/100ML-% IV SOLN
INTRAVENOUS | Status: AC
  Administered 2023-05-28: 9.5 [IU]/h via INTRAVENOUS
  Administered 2023-05-29: 13 [IU]/h via INTRAVENOUS
  Administered 2023-05-29: 5.5 [IU]/h via INTRAVENOUS
  Filled 2023-05-28 (×3): qty 100

## 2023-05-28 MED ORDER — SODIUM CHLORIDE 0.9 % IV SOLN: 250.0000 mL | INTRAVENOUS | Status: DC

## 2023-05-28 MED ORDER — 0.9 % SODIUM CHLORIDE (POUR BTL) OPTIME
TOPICAL | Status: DC | PRN
  Administered 2023-05-28: 1000 mL

## 2023-05-28 MED ORDER — LACTATED RINGERS IV BOLUS (SEPSIS)
500.0000 mL | Freq: Once | INTRAVENOUS | Status: AC
  Administered 2023-05-28: 500 mL via INTRAVENOUS

## 2023-05-28 MED ORDER — LEVOTHYROXINE SODIUM 88 MCG PO TABS
88.0000 ug | ORAL_TABLET | Freq: Every day | ORAL | Status: DC
  Administered 2023-05-29 – 2023-05-30 (×2): 88 ug
  Filled 2023-05-28 (×2): qty 1

## 2023-05-28 MED ORDER — NOREPINEPHRINE 4 MG/250ML-% IV SOLN
0.0000 ug/min | INTRAVENOUS | Status: DC
  Administered 2023-05-29: 15 ug/min via INTRAVENOUS
  Administered 2023-05-29: 19 ug/min via INTRAVENOUS
  Administered 2023-05-29: 6 ug/min via INTRAVENOUS
  Administered 2023-05-29: 2 ug/min via INTRAVENOUS
  Filled 2023-05-28 (×3): qty 250

## 2023-05-28 MED ORDER — ORAL CARE MOUTH RINSE: 15.0000 mL | OROMUCOSAL | Status: DC | PRN

## 2023-05-28 MED ORDER — DOCUSATE SODIUM 50 MG/5ML PO LIQD
100.0000 mg | Freq: Two times a day (BID) | ORAL | Status: DC
  Administered 2023-05-29 – 2023-05-30 (×3): 100 mg
  Filled 2023-05-28 (×3): qty 10

## 2023-05-28 MED ORDER — ROCURONIUM BROMIDE 100 MG/10ML IV SOLN
INTRAVENOUS | Status: DC | PRN
  Administered 2023-05-28: 30 mg via INTRAVENOUS

## 2023-05-28 MED ORDER — POTASSIUM CHLORIDE 10 MEQ/50ML IV SOLN
INTRAVENOUS | Status: AC
  Filled 2023-05-28: qty 50

## 2023-05-28 MED ORDER — DEXTROSE IN LACTATED RINGERS 5 % IV SOLN: INTRAVENOUS | Status: AC

## 2023-05-28 MED ORDER — ASPIRIN 81 MG PO TBEC
81.0000 mg | DELAYED_RELEASE_TABLET | Freq: Every day | ORAL | Status: DC
  Administered 2023-05-30 – 2023-06-11 (×13): 81 mg via ORAL
  Filled 2023-05-28 (×13): qty 1

## 2023-05-28 MED ORDER — HEPARIN SODIUM (PORCINE) 5000 UNIT/ML IJ SOLN
5000.0000 [IU] | Freq: Three times a day (TID) | INTRAMUSCULAR | Status: DC
  Administered 2023-05-29 – 2023-06-11 (×41): 5000 [IU] via SUBCUTANEOUS
  Filled 2023-05-28 (×39): qty 1

## 2023-05-28 MED ORDER — MAGNESIUM SULFATE 2 GM/50ML IV SOLN
2.0000 g | Freq: Once | INTRAVENOUS | Status: AC
  Administered 2023-05-28: 2 g via INTRAVENOUS
  Filled 2023-05-28: qty 50

## 2023-05-28 MED ORDER — METRONIDAZOLE 500 MG/100ML IV SOLN
500.0000 mg | Freq: Once | INTRAVENOUS | Status: AC
  Administered 2023-05-28: 500 mg via INTRAVENOUS
  Filled 2023-05-28: qty 100

## 2023-05-28 MED ORDER — ORAL CARE MOUTH RINSE: 15.0000 mL | OROMUCOSAL | Status: DC

## 2023-05-28 MED ORDER — FENTANYL CITRATE (PF) 250 MCG/5ML IJ SOLN
INTRAMUSCULAR | Status: AC
  Filled 2023-05-28: qty 5

## 2023-05-28 MED ORDER — LACTATED RINGERS IV SOLN: INTRAVENOUS | Status: DC

## 2023-05-28 MED ORDER — SODIUM CHLORIDE 0.9 % IV SOLN
1.0000 g | INTRAVENOUS | Status: DC
  Administered 2023-05-29 – 2023-05-30 (×2): 1 g via INTRAVENOUS
  Filled 2023-05-28 (×2): qty 10

## 2023-05-28 MED ORDER — POLYETHYLENE GLYCOL 3350 17 G PO PACK
17.0000 g | PACK | Freq: Every day | ORAL | Status: DC
  Administered 2023-05-29 – 2023-05-30 (×2): 17 g
  Filled 2023-05-28 (×2): qty 1

## 2023-05-28 MED ORDER — VANCOMYCIN HCL IN DEXTROSE 1-5 GM/200ML-% IV SOLN
INTRAVENOUS | Status: AC
  Filled 2023-05-28: qty 200

## 2023-05-28 MED ORDER — NOREPINEPHRINE 4 MG/250ML-% IV SOLN
2.0000 ug/min | INTRAVENOUS | Status: DC
  Filled 2023-05-28: qty 250

## 2023-05-28 MED ORDER — ACETAMINOPHEN 325 MG PO TABS
650.0000 mg | ORAL_TABLET | Freq: Once | ORAL | Status: AC
  Administered 2023-05-28: 650 mg via ORAL
  Filled 2023-05-28: qty 2

## 2023-05-28 MED ORDER — DEXTROSE 50 % IV SOLN
0.0000 mL | INTRAVENOUS | Status: DC | PRN
Start: 2023-05-28 — End: 2023-06-11

## 2023-05-28 MED ORDER — CHLORHEXIDINE GLUCONATE CLOTH 2 % EX PADS
6.0000 | MEDICATED_PAD | Freq: Every day | CUTANEOUS | Status: DC
  Administered 2023-05-29 – 2023-06-11 (×13): 6 via TOPICAL

## 2023-05-28 MED ORDER — POTASSIUM CHLORIDE 10 MEQ/100ML IV SOLN: 10.0000 meq | INTRAVENOUS | Status: DC

## 2023-05-28 MED ORDER — PANTOPRAZOLE SODIUM 40 MG IV SOLR
40.0000 mg | Freq: Every day | INTRAVENOUS | Status: DC
  Administered 2023-05-29 – 2023-05-30 (×2): 40 mg via INTRAVENOUS
  Filled 2023-05-28 (×2): qty 10

## 2023-05-28 MED ORDER — VASOPRESSIN 20 UNIT/ML IV SOLN
INTRAVENOUS | Status: DC | PRN
  Administered 2023-05-28 (×2): 1 [IU] via INTRAVENOUS

## 2023-05-28 MED ORDER — ESCITALOPRAM OXALATE 10 MG PO TABS
20.0000 mg | ORAL_TABLET | Freq: Every day | ORAL | Status: DC
  Administered 2023-05-29 – 2023-05-30 (×2): 20 mg
  Filled 2023-05-28 (×2): qty 2

## 2023-05-28 MED ORDER — WATER FOR IRRIGATION, STERILE IR SOLN
Status: DC | PRN
  Administered 2023-05-28: 3000 mL via SURGICAL_CAVITY
  Administered 2023-05-28: 1000 mL via SURGICAL_CAVITY

## 2023-05-28 MED ORDER — ROSUVASTATIN CALCIUM 5 MG PO TABS
2.5000 mg | ORAL_TABLET | Freq: Every day | ORAL | Status: DC
  Administered 2023-05-29 – 2023-05-30 (×2): 2.5 mg
  Filled 2023-05-28 (×2): qty 0.5

## 2023-05-28 MED ORDER — DROPERIDOL 2.5 MG/ML IJ SOLN: 0.6250 mg | Freq: Once | INTRAMUSCULAR | Status: DC | PRN

## 2023-05-28 MED ORDER — SUCCINYLCHOLINE CHLORIDE 200 MG/10ML IV SOSY
PREFILLED_SYRINGE | INTRAVENOUS | Status: DC | PRN
  Administered 2023-05-28: 100 mg via INTRAVENOUS

## 2023-05-28 MED ORDER — RISPERIDONE 3 MG PO TABS
3.0000 mg | ORAL_TABLET | Freq: Every day | ORAL | Status: DC
  Administered 2023-05-29: 3 mg
  Filled 2023-05-28: qty 1

## 2023-05-28 MED ORDER — FENTANYL CITRATE (PF) 250 MCG/5ML IJ SOLN: INTRAMUSCULAR | Status: DC | PRN

## 2023-05-28 MED ORDER — VANCOMYCIN HCL IN DEXTROSE 1-5 GM/200ML-% IV SOLN: 1000.0000 mg | Freq: Once | INTRAVENOUS | Status: DC

## 2023-05-28 MED ORDER — FENTANYL CITRATE (PF) 100 MCG/2ML IJ SOLN: 25.0000 ug | INTRAMUSCULAR | Status: DC | PRN

## 2023-05-28 MED ORDER — SODIUM CHLORIDE 0.9 % IV SOLN
2.0000 g | Freq: Once | INTRAVENOUS | Status: AC
  Administered 2023-05-28: 2 g via INTRAVENOUS
  Filled 2023-05-28: qty 12.5

## 2023-05-28 MED ORDER — FENTANYL CITRATE PF 50 MCG/ML IJ SOSY
25.0000 ug | PREFILLED_SYRINGE | INTRAMUSCULAR | Status: AC | PRN
  Administered 2023-05-30 (×3): 25 ug via INTRAVENOUS
  Filled 2023-05-28 (×3): qty 1

## 2023-05-28 MED ORDER — PHENYLEPHRINE HCL (PRESSORS) 10 MG/ML IV SOLN
INTRAVENOUS | Status: DC | PRN
  Administered 2023-05-28: 240 ug via INTRAVENOUS
  Administered 2023-05-28 (×5): 160 ug via INTRAVENOUS

## 2023-05-28 MED ORDER — LACTATED RINGERS IV SOLN: INTRAVENOUS | Status: DC | PRN

## 2023-05-28 MED ORDER — VASOPRESSIN 20 UNIT/ML IV SOLN
INTRAVENOUS | Status: AC
Start: 2023-05-28 — End: ?
  Filled 2023-05-28: qty 1

## 2023-05-28 MED ORDER — ONDANSETRON HCL 4 MG/2ML IJ SOLN: 4.0000 mg | Freq: Four times a day (QID) | INTRAMUSCULAR | Status: DC | PRN

## 2023-05-28 MED ORDER — LIDOCAINE HCL (CARDIAC) PF 100 MG/5ML IV SOSY
PREFILLED_SYRINGE | INTRAVENOUS | Status: DC | PRN
  Administered 2023-05-28: 60 mg via INTRATRACHEAL

## 2023-05-28 MED ORDER — PROPOFOL 1000 MG/100ML IV EMUL
0.0000 ug/kg/min | INTRAVENOUS | Status: DC
  Administered 2023-05-28: 5 ug/kg/min via INTRAVENOUS
  Filled 2023-05-28 (×2): qty 100

## 2023-05-28 MED ORDER — LACTATED RINGERS IV SOLN: INTRAVENOUS | Status: AC

## 2023-05-28 MED ORDER — VANCOMYCIN HCL 1500 MG/300ML IV SOLN
1500.0000 mg | Freq: Once | INTRAVENOUS | Status: DC
  Filled 2023-05-28: qty 300

## 2023-05-28 MED ORDER — FENTANYL CITRATE PF 50 MCG/ML IJ SOSY
25.0000 ug | PREFILLED_SYRINGE | INTRAMUSCULAR | Status: DC | PRN
  Administered 2023-05-28 – 2023-05-30 (×3): 50 ug via INTRAVENOUS
  Filled 2023-05-28 (×3): qty 1

## 2023-05-28 MED ORDER — PROPOFOL 10 MG/ML IV BOLUS
INTRAVENOUS | Status: DC | PRN
  Administered 2023-05-28: 80 mg via INTRAVENOUS

## 2023-05-28 MED ORDER — IOHEXOL 300 MG/ML  SOLN
INTRAMUSCULAR | Status: DC | PRN
  Administered 2023-05-28: 5 mL

## 2023-05-28 SURGICAL SUPPLY — 25 items
BAG URINE DRAIN 2000ML AR STRL (UROLOGICAL SUPPLIES) ×1 IMPLANT
BAG URO CATCHER STRL LF (MISCELLANEOUS) ×1 IMPLANT
CATH FOLEY 2WAY SLVR 5CC 16FR (CATHETERS) IMPLANT
CATH URETL OPEN END 6FR 70 (CATHETERS) ×1 IMPLANT
CNTNR URN SCR LID CUP LEK RST (MISCELLANEOUS) IMPLANT
GLOVE BIO SURGEON STRL SZ8 (GLOVE) ×1 IMPLANT
GLOVE BIOGEL PI IND STRL 6.5 (GLOVE) IMPLANT
GLOVE BIOGEL PI IND STRL 7.0 (GLOVE) IMPLANT
GLOVE SURG SS PI 7.0 STRL IVOR (GLOVE) IMPLANT
GOWN STRL REUS W/ TWL LRG LVL3 (GOWN DISPOSABLE) ×1 IMPLANT
GOWN STRL REUS W/ TWL XL LVL3 (GOWN DISPOSABLE) ×1 IMPLANT
GUIDEWIRE ANG ZIPWIRE 038X150 (WIRE) IMPLANT
GUIDEWIRE STR DUAL SENSOR (WIRE) ×1 IMPLANT
KIT BASIN OR (CUSTOM PROCEDURE TRAY) IMPLANT
KIT TURNOVER KIT B (KITS) ×1 IMPLANT
MANIFOLD NEPTUNE II (INSTRUMENTS) IMPLANT
NS IRRIG 1000ML POUR BTL (IV SOLUTION) IMPLANT
PACK CYSTO (CUSTOM PROCEDURE TRAY) ×1 IMPLANT
STENT URET 6FRX24 CONTOUR (STENTS) IMPLANT
STENT URET 6FRX26 CONTOUR (STENTS) IMPLANT
SYPHON OMNI JUG (MISCELLANEOUS) ×1 IMPLANT
SYR 20ML LL LF (SYRINGE) IMPLANT
TOWEL GREEN STERILE FF (TOWEL DISPOSABLE) ×1 IMPLANT
TUBE CONNECTING 12X1/4 (SUCTIONS) IMPLANT
WATER STERILE IRR 3000ML UROMA (IV SOLUTION) ×1 IMPLANT

## 2023-05-28 NOTE — ED Triage Notes (Signed)
Pt BIB by EMS from [facility]. Facility reports AMS and lethargy since this morning. Pt reporting body aches- given Tylenol. Pt is A&O at baseline, confused to place and situation. CBG 531.  EMS VS BP 140/94 after 500 ml NS HR 82 on beta blocker 94% RA

## 2023-05-28 NOTE — H&P (Signed)
NAME:  Deanna Shea, MRN:  [id number], DOB:  09-27-1945, LOS: 0 ADMISSION DATE:  05/28/2023 CONSULTATION DATE:  05/28/2023 REFERRING MD:  Durwin Nora - EDP CHIEF COMPLAINT:  Urosepsis   History of Present Illness:  78 year old woman who presented to [facility] ED 1/20 for AMS and lethargy. PMHx significant for Afib (on ASA/digoxin/propranolol), mitral valve prolapse, HLD, T2DM, nephrolithiasis, hypothyroidism, anxiety/depression, schizophrenia.  Patient initially presented to [facility] ED from her assisted living facility for AMS and lethargy x 1 day; she was reportedly confused to place/situation and hyperglycemic to 500s. At baseline, alert and oriented. On ED arrival, patient was afebrile, HR 99 (on BB), BP 108/50, RR 30, SpO2 97%. Labs were notable for WBC 6.7, Hgb 15.4 (baseline 12-15), Plt 121 (baseline 245-300). INR 1.4. Na 126, K 5.1, CO2 14, Cr 2.48 (baseline 0.6-1), glucose 501, Mg 1.4. CK 150. TSH 4.1. Dig level WNL. Trop 34 (likely demand-related), BNP 89. B-hydroxybutyrate 1.19. LA 5.5 > 6.5. UA with > 500 glucose, moderate Hgb, 5 ketones, 100 protein, moderate leuks. COVID/Flu/RSV negative and BCx sent. Empiric broad-spectrum antibiotics were started for presumed sepsis. CT Head NAICA, CT Chest/A/P demonstrated obstructing  7mm UVJ calculus with moderate L-sided obstructive uropathy, L renal edema and perinephric stranding; gas was noted in the bladder, L ureter and L renal collecting system. Urology was consulted with plan for L ureteral stent placement.  PCCM consulted for ICU admission postoperatively.  Pertinent Medical History:   Past Medical History:  Diagnosis Date   Allergic rhinitis    Anxiety    Atrial fibrillation (HCC) 10/28/2013   diagnoses in the 20's, well controlled with Digoxin and Inderal    Depression    Diabetes mellitus without complication (HCC)    Gall stones    Heart murmur    Kidney disease, medullary sponge    Kidney stones    Mitral valve disorders(424.0) 10/28/2013    Mitral valve prolapse    congenital    MRSA (methicillin resistant Staphylococcus aureus)    9/10   Osteopenia    dexa 4/09, improved but not resolved 2011 DEXA   Palpitations 10/28/2013   Schizophrenia (HCC)    Shingles    2000   Significant Hospital Events: Including procedures, antibiotic start and stop dates in addition to other pertinent events   1/20 - Presented to [facility] ED for AMS/lethargy. Found to have ?DKA, urosepsis. Elevated LA. CT A/P showed obstructing L UVJ calculus with obstructive uropathy. Uro consulted. Taken to OR for L ureteral stent. PCCM consulted for ICU admission.  Interim History / Subjective:  PCCM consulted for ICU admission postoperatively.  Objective:  Blood pressure (!) 108/50, pulse 99, temperature 98.5 F (36.9 C), temperature source Oral, resp. rate (!) 30, height 5\' 7"  (1.702 m), weight 79.2 kg, SpO2 100%.    Vent Mode: PRVC FiO2 (%):  [60 %] 60 % Set Rate:  [18 bmp] 18 bmp Vt Set:  [490 mL] 490 mL PEEP:  [5 cmH20] 5 cmH20 Plateau Pressure:  [17 cmH20] 17 cmH20   Intake/Output Summary (Last 24 hours) at 05/28/2023 2338 Last data filed at 05/28/2023 2237 Gross per 24 hour  Intake 3000 ml  Output 0 ml  Net 3000 ml   Filed Weights   05/28/23 2202 05/28/23 2331  Weight: 73.9 kg 79.2 kg   Physical Examination: General: Acute-on-chronically ill-appearing older woman in NAD. HEENT: [place]/AT, anicteric sclera, PERRL 3mm moist mucous membranes. Neuro:  Intubated, lightly sedated.  Responds to verbal stimuli. Following commands consistently. Moves all 4  extremities spontaneously. Generalized weakness.+Cough and +Gag  CV: RRR, no m/g/r. PULM: Breathing even and unlabored on vent (PEEP 5, FiO2 60%). Lung fields CTAB. GI: Soft, mild generalized TTP, mildly distended. Normoactive bowel sounds. Extremities: Bilateral symmetric chronic-appearing 1-2+ nonpitting LE edema noted. Skin: Warm/dry, no rashes. Mild facial erythema, chronic-appearing.  Resolved  Hospital Problem List:    Assessment & Plan:  Septic shock in the setting of urosepsis due to obstructive uropathy Obstructive uropathy due to obstructing L UVJ calculus - Admit to ICU postoperatively - Goal MAP > 65 - Fluid resuscitation as tolerated - Levophed titrated to goal MAP if unable to maintain - Trend WBC, fever curve - F/u Cx data - Continue broad-spectrum antibiotics (ceftriaxone, Flagyl if anaerobic coverage needed) - Urology following for management of L ureteral obstruction, appreciate assistance - Foley management per Uro  Postoperative ventilator management due to respiratory insufficiency/unstable hemodynamics - Continue full vent support (4-8cc/kg IBW) - Wean FiO2 for O2 sat > 90% - Daily WUA/SBT when appropriate - VAP bundle - Pulmonary hygiene - PAD protocol for sedation: Propofol and Fentanyl for goal RASS 0 to -1  Presumed DKA T2DM - Insulin gtt via EndoTool - CBGs per protocol - Transition to SSI when appropriate - Hold home metformin  AKI in the setting of obstructive uropathy AGMA Hyperkalemia Hypomagnesemia - Trend BMP - Replete electrolytes as indicated - Monitor I&Os - F/u urine studies - Avoid nephrotoxic agents as able - Ensure adequate renal perfusion  Afib MVP - Cardiac monitoring - Optimize electrolytes for K > 4, Mg > 2 - ASA for [facility] - Home medications: Digoxin, propranolol; hold in the immediate postoperative period while hemodynamics stabilize, resume as appropriate  Hypothyroidism - Continue home Synthroid  Depression Anxiety Schizophrenia - Resume home risperidone, Lexapro as clinically appropriate  Best Practice: (right click and "Reselect all SmartList Selections" daily)   Diet/type: NPO DVT prophylaxis: SCDs GI prophylaxis: N/A Lines: N/A Foley:  Yes, and it is still needed (removal timeline per Urology) Code Status:  full code Last date of multidisciplinary goals of care discussion [Pending]  Labs:   CBC: Recent Labs  Lab 05/28/23 1829 05/28/23 1836 05/28/23 2208 05/28/23 2217  WBC 6.7  --   --   --   NEUTROABS 5.4  --   --   --   HGB 15.4* 16.7* 12.2 12.2  HCT 46.7* 49.0* 36.0 36.0  MCV 88.8  --   --   --   PLT 121*  --   --   --    Basic Metabolic Panel: Recent Labs  Lab 05/28/23 1829 05/28/23 1836 05/28/23 2208 05/28/23 2217  NA 126* 129* 131* 132*  K 5.1 4.9 4.0 4.0  CL 92* 95*  --  97*  CO2 14*  --   --   --   GLUCOSE 501* 495*  --  429*  BUN 32* 32*  --  28*  CREATININE 2.48* 2.30*  --  1.80*  CALCIUM 8.5*  --   --   --   MG 1.4*  --   --   --    GFR: Estimated Creatinine Clearance: 29.2 mL/min (A) (by C-G formula based on SCr of 1.8 mg/dL (H)). Recent Labs  Lab 05/28/23 1829 05/28/23 1837 05/28/23 2054  WBC 6.7  --   --   LATICACIDVEN  --  5.5* 6.5*   Liver Function Tests: Recent Labs  Lab 05/28/23 1829  AST 49*  ALT 40  ALKPHOS 57  BILITOT 1.5*  PROT 6.5  ALBUMIN 3.3*   No results for input(s): "LIPASE", "AMYLASE" in the last 168 hours. Recent Labs  Lab 05/28/23 1820  AMMONIA 19   ABG:    Component Value Date/Time   PHART 7.263 (L) 05/28/2023 2208   PCO2ART 35.3 05/28/2023 2208   PO2ART 369 (H) 05/28/2023 2208   HCO3 16.1 (L) 05/28/2023 2208   TCO2 18 (L) 05/28/2023 2217   ACIDBASEDEF 10.0 (H) 05/28/2023 2208   O2SAT 100 05/28/2023 2208    Coagulation Profile: Recent Labs  Lab 05/28/23 1829  INR 1.4*   Cardiac Enzymes: Recent Labs  Lab 05/28/23 1829  CKTOTAL 150   HbA1C: Hgb A1c MFr Bld  Date/Time Value Ref Range Status  01/03/2023 02:23 PM 7.0 (H) 4.8 - 5.6 % Final    Comment:    (NOTE) Pre diabetes:          5.7%-6.4%  Diabetes:              >6.4%  Glycemic control for   <7.0% adults with diabetes    CBG: No results for input(s): "GLUCAP" in the last 168 hours.  Review of Systems:   Patient is encephalopathic and/or intubated; therefore, history has been obtained from chart review.   Past Medical  History:  She,  has a past medical history of Allergic rhinitis, Anxiety, Atrial fibrillation (HCC) (10/28/2013), Depression, Diabetes mellitus without complication (HCC), Gall stones, Heart murmur, Kidney disease, medullary sponge, Kidney stones, Mitral valve disorders(424.0) (10/28/2013), Mitral valve prolapse, MRSA (methicillin resistant Staphylococcus aureus), Osteopenia, Palpitations (10/28/2013), Schizophrenia (HCC), and Shingles.   Surgical History:   Past Surgical History:  Procedure Laterality Date   APPENDECTOMY  01/29/2014   CYSTOSCOPY W/ STONE MANIPULATION  7829'F   ELBOW ARTHROSCOPY Left 2007   ELBOW ARTHROSCOPY Right 2008   "cyst removed; tricep"   ELBOW ARTHROSCOPY WITH TENDON RECONSTRUCTION Right 2005; 2006   EXPLORATORY LAPAROTOMY  01/29/2014   KNEE ARTHROSCOPY Right 2004   "bone chip removed"   KNEE ARTHROSCOPY Right 02/2009   "cleanup loose cartilage"   LAPAROTOMY N/A 01/29/2014   Procedure: EXPLORATORY LAPAROTOMY, SMALL BOWEL RESECTION, APPENDECTOMY;  Surgeon: Manus Rudd, MD;  Location: [facility] OR;  Service: General;  Laterality: N/A;   NASAL SINUS SURGERY  X 2   "for polyps"   SHOULDER ARTHROSCOPY Right 1990's X 2   SHOULDER ARTHROSCOPY W/ ROTATOR CUFF REPAIR Right    SMALL INTESTINE SURGERY  01/29/2014   Social History:   reports that she has never smoked. She has never used smokeless tobacco. She reports that she does not drink alcohol and does not use drugs.   Family History:  Her family history includes Arthritis in her father; Cataracts in her father; Dementia in her father; Diabetes Mellitus II in her mother; Hypertension in her mother. There is no history of Heart attack.   Allergies: Allergies  Allergen Reactions   Sulfa Antibiotics Other (See Comments)    Sores in mouth   Home Medications: Prior to Admission medications   Medication Sig Start Date End Date Taking? Authorizing Provider  acetaminophen (TYLENOL) 325 MG tablet Take 2 tablets (650 mg total) by  mouth every 6 (six) hours as needed for mild pain (or Fever >/= 101). 01/05/23   Osvaldo Shipper, MD  aspirin EC 81 MG tablet Take 81 mg by mouth daily.    [provider]  digoxin (LANOXIN) 0.25 MG tablet TAKE 1 TABLET BY MOUTH EVERY OTHER DAY 03/01/23   Corky Crafts, MD  escitalopram (  LEXAPRO) 20 MG tablet Take 20 mg by mouth daily.  10/08/15   [provider]  levothyroxine (SYNTHROID) 88 MCG tablet Take 88 mcg by mouth daily before breakfast.    [provider]  metFORMIN (GLUCOPHAGE-XR) 500 MG 24 hr tablet Take 500 mg by mouth in the morning and at bedtime.    [provider]  propranolol (INDERAL) 20 MG tablet TAKE 1 TABLET BY MOUTH 4 TIMES DAILY. 03/01/23   Corky Crafts, MD  risperiDONE (RISPERDAL) 3 MG tablet Take 3 mg by mouth at bedtime.    [provider]  rosuvastatin (CRESTOR) 5 MG tablet Take 2.5 mg by mouth daily. 11/30/19   [provider]    Critical care time:   The patient is critically ill with multiple organ system failure and requires high complexity decision making for assessment and support, frequent evaluation and titration of therapies, advanced monitoring, review of radiographic studies and interpretation of complex data.   Critical Care Time devoted to patient care services, exclusive of separately billable procedures, described in this note is 41 minutes.  Tim Lair, PA-C [facility] 05/28/23 11:38 PM  Please see [email or web address] for pager details.  From 7A-7P if no response, please call [phone] After hours, please call ELink [phone]

## 2023-05-28 NOTE — ED Notes (Signed)
Pt transport with RN on cardiac monitor to PACU. Handoff given.

## 2023-05-28 NOTE — Transfer of Care (Signed)
Immediate Anesthesia Transfer of Care Note  Patient: Deanna Shea  Procedure(s) Performed: CYSTOSCOPY WITH RETROGRADE PYELOGRAM/URETERAL STENT PLACEMENT (Left)  Patient Location: ICU  Anesthesia Type:General  Level of Consciousness: sedated and Patient remains intubated per anesthesia plan  Airway & Oxygen Therapy: Patient remains intubated per anesthesia plan and Patient placed on Ventilator (see vital sign flow sheet for setting)  Post-op Assessment: Report given to RN and Post -op Vital signs reviewed and stable  Post vital signs: Reviewed and stable  Last Vitals:  Vitals Value Taken Time  BP    Temp    Pulse    Resp    SpO2      Last Pain:  Vitals:   05/28/23 2030  TempSrc: Oral  PainSc:          Complications: No notable events documented.

## 2023-05-28 NOTE — Anesthesia Procedure Notes (Signed)
Procedure Name: Intubation Date/Time: 05/28/2023 10:08 PM  Performed by: Claudina Lick, CRNAPre-anesthesia Checklist: Patient identified, Emergency Drugs available, Suction available and Patient being monitored Patient Re-evaluated:Patient Re-evaluated prior to induction Oxygen Delivery Method: Circle system utilized Preoxygenation: Pre-oxygenation with 100% oxygen Induction Type: IV induction, Cricoid Pressure applied and Rapid sequence Laryngoscope Size: Glidescope and 3 (DL x 1 with Hyacinth Meeker 2- grade 3/4 view- no attempt made to place ETT. DLx 1 with Glidescope- grade 1 view and easy placement of ETT. + ETCO2, = bbs.) Grade View: Grade I Tube type: Oral Number of attempts: 2 (see note) Airway Equipment and Method: Stylet and Video-laryngoscopy Placement Confirmation: ETT inserted through vocal cords under direct vision, positive ETCO2 and breath sounds checked- equal and bilateral Secured at: 21 cm Tube secured with: Tape Dental Injury: Teeth and Oropharynx as per pre-operative assessment

## 2023-05-28 NOTE — ED Notes (Signed)
Dr. Arta Bruce at bedside to discuss need for stent placement.

## 2023-05-28 NOTE — Progress Notes (Signed)
Elink monitoring for the code sepsis protocol.  

## 2023-05-28 NOTE — ED Notes (Signed)
Critical given to Dr. Durwin Nora NB

## 2023-05-28 NOTE — Anesthesia Procedure Notes (Signed)
Central Venous Catheter Insertion Performed by: Lewie Loron, MD, anesthesiologist Start/End1/20/2025 9:50 PM, 05/28/2023 10:10 PM Patient location: Pre-op. Preanesthetic checklist: patient identified, IV checked, site marked, risks and benefits discussed, surgical consent, monitors and equipment checked, pre-op evaluation, timeout performed and anesthesia consent Position: supine Lidocaine 1% used for infiltration and patient sedated Hand hygiene performed  and maximum sterile barriers used  Catheter size: 8 Fr Total catheter length 16. Central line was placed.Double lumen Procedure performed using ultrasound guided technique. Ultrasound Notes:anatomy identified, needle tip was noted to be adjacent to the nerve/plexus identified, no ultrasound evidence of intravascular and/or intraneural injection and image(s) printed for medical record Attempts: 1 Following insertion, dressing applied, line sutured and Biopatch. Post procedure assessment: blood return through all ports, free fluid flow and no air  Patient tolerated the procedure well with no immediate complications.

## 2023-05-28 NOTE — Consult Note (Signed)
I have been asked to see the patient by Dr. Gloris Manchester, for evaluation and management of left obstructive pyelonephritis.  History of present illness: 78 year old with a known history of nephrolithiasis referred to [facility] but missed her appointment presented to the ER today with fatigue and altered mental status.  CT demonstrated a left distal ureteral stone as well as air in the ureter and collecting system, patient was also tachycardic and febrile to 101.4.  On my interview with the patient she was unable to complete the interview due to confusion, her heart rate was in the upper 90s but she is on beta-blockers.  PMH: Atrial fibrillation, depression, diabetes, heart murmur, mitral valve disorder, schizophrenia, shingles. Past surgical history: Small intestine 2015, appendectomy cystoscopy in 1990, multiple orthopedic surgeries.  CT demonstrates a left 7 mm UVJ calculus with left-sided hydro.  There is gas within the bladder and left ureter and left renal collecting system  Creatinine 2.48 sodium 126 likely due to elevated glucose above 500, lactate 5.5.,  WBC 6.7   Review of systems: A 12 point comprehensive review of systems was obtained and is negative unless otherwise stated in the history of present illness.  Patient Active Problem List   Diagnosis Date Noted   Rhabdomyolysis 01/03/2023   Hypothyroid 07/24/2021   Diet-controlled type 2 diabetes mellitus (HCC) 12/30/2015   SBO (small bowel obstruction) (HCC) 01/27/2014   Atrial fibrillation (HCC) 10/28/2013   Palpitations 10/28/2013   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    No current facility-administered medications on file prior to encounter.   Current Outpatient Medications on File Prior to Encounter  Medication Sig Dispense Refill   acetaminophen (TYLENOL) 325 MG tablet Take 2 tablets (650 mg total) by mouth every 6 (six) hours as needed for mild pain (or Fever >/= 101).     aspirin EC 81 MG tablet Take 81 mg by mouth  daily.     digoxin (LANOXIN) 0.25 MG tablet TAKE 1 TABLET BY MOUTH EVERY OTHER DAY 45 tablet 1   escitalopram (LEXAPRO) 20 MG tablet Take 20 mg by mouth daily.      levothyroxine (SYNTHROID) 88 MCG tablet Take 88 mcg by mouth daily before breakfast.     metFORMIN (GLUCOPHAGE-XR) 500 MG 24 hr tablet Take 500 mg by mouth in the morning and at bedtime.     propranolol (INDERAL) 20 MG tablet TAKE 1 TABLET BY MOUTH 4 TIMES DAILY. 360 tablet 1   risperiDONE (RISPERDAL) 3 MG tablet Take 3 mg by mouth at bedtime.     rosuvastatin (CRESTOR) 5 MG tablet Take 2.5 mg by mouth daily.      Past Medical History:  Diagnosis Date   Allergic rhinitis    Anxiety    Atrial fibrillation (HCC) 10/28/2013   diagnoses in the 20's, well controlled with Digoxin and Inderal    Depression    Diabetes mellitus without complication (HCC)    Gall stones    Heart murmur    Kidney disease, medullary sponge    Kidney stones    Mitral valve disorders(424.0) 10/28/2013   Mitral valve prolapse    congenital    MRSA (methicillin resistant Staphylococcus aureus)    9/10   Osteopenia    dexa 4/09, improved but not resolved 2011 DEXA   Palpitations 10/28/2013   Schizophrenia (HCC)    Shingles    2000    Past Surgical History:  Procedure Laterality Date   APPENDECTOMY  01/29/2014   CYSTOSCOPY W/ STONE MANIPULATION  1990's  ELBOW ARTHROSCOPY Left 2007   ELBOW ARTHROSCOPY Right 2008   "cyst removed; tricep"   ELBOW ARTHROSCOPY WITH TENDON RECONSTRUCTION Right 2005; 2006   EXPLORATORY LAPAROTOMY  01/29/2014   KNEE ARTHROSCOPY Right 2004   "bone chip removed"   KNEE ARTHROSCOPY Right 02/2009   "cleanup loose cartilage"   LAPAROTOMY N/A 01/29/2014   Procedure: EXPLORATORY LAPAROTOMY, SMALL BOWEL RESECTION, APPENDECTOMY;  Surgeon: Manus Rudd, MD;  Location: [facility] OR;  Service: General;  Laterality: N/A;   NASAL SINUS SURGERY  X 2   "for polyps"   SHOULDER ARTHROSCOPY Right 1990's X 2   SHOULDER ARTHROSCOPY W/  ROTATOR CUFF REPAIR Right    SMALL INTESTINE SURGERY  01/29/2014    Social History   Tobacco Use   Smoking status: Never   Smokeless tobacco: Never  Substance Use Topics   Alcohol use: No   Drug use: No    Family History  Problem Relation Age of Onset   Hypertension Mother    Diabetes Mellitus II Mother    Arthritis Father    Cataracts Father    Dementia Father    Heart attack Neg Hx     PE: Vitals:   05/28/23 2000 05/28/23 2015 05/28/23 2030 05/28/23 2045  BP: (!) 128/56 (!) 108/50 (!) 100/41 (!) 106/37  Pulse: 98 95 93 96  Resp: (!) 39 (!) 22 (!) 24 (!) 31  Temp:   98.5 F (36.9 C)   TempSrc:   Oral   SpO2: 94% 95% 94% 94%   General: Patient confused, answers questions inappropriately Respiratory: Normal work of breathing on room air Cardiovascular: Nontachycardic heart rate in the 90s GI: Abdomen soft nontender GU: No catheter in place left-sided flank pain moderate right sided flank pain Extremities: Minor peripheral edema  Recent Labs    05/28/23 1829 05/28/23 1836  WBC 6.7  --   HGB 15.4* 16.7*  HCT 46.7* 49.0*   Recent Labs    05/28/23 1829 05/28/23 1836  NA 126* 129*  K 5.1 4.9  CL 92* 95*  CO2 14*  --   GLUCOSE 501* 495*  BUN 32* 32*  CREATININE 2.48* 2.30*  CALCIUM 8.5*  --    Recent Labs    05/28/23 1829  INR 1.4*   No results for input(s): "LABURIN" in the last 72 hours. Results for orders placed or performed during the hospital encounter of 05/28/23  Resp panel by RT-PCR (RSV, Flu A&B, Covid) Anterior Nasal Swab     Status: None   Collection Time: 05/28/23  6:18 PM   Specimen: Anterior Nasal Swab  Result Value Ref Range Status   SARS Coronavirus 2 by RT PCR NEGATIVE NEGATIVE Final   Influenza A by PCR NEGATIVE NEGATIVE Final   Influenza B by PCR NEGATIVE NEGATIVE Final    Comment: (NOTE) The Xpert Xpress SARS-CoV-2/FLU/RSV plus assay is intended as an aid in the diagnosis of influenza from Nasopharyngeal swab specimens  and should not be used as a sole basis for treatment. Nasal washings and aspirates are unacceptable for Xpert Xpress SARS-CoV-2/FLU/RSV testing.  Fact Sheet for Patients: BloggerCourse.com  Fact Sheet for Healthcare Providers: SeriousBroker.it  This test is not yet approved or cleared by the [place] FDA and has been authorized for detection and/or diagnosis of SARS-CoV-2 by FDA under an Emergency Use Authorization (EUA). This EUA will remain in effect (meaning this test can be used) for the duration of the COVID-19 declaration under Section 564(b)(1) of the Act, 21 U.S.C. section 360bbb-3(b)(1),  unless the authorization is terminated or revoked.     Resp Syncytial Virus by PCR NEGATIVE NEGATIVE Final    Comment: (NOTE) Fact Sheet for Patients: BloggerCourse.com  Fact Sheet for Healthcare Providers: SeriousBroker.it  This test is not yet approved or cleared by the [place] FDA and has been authorized for detection and/or diagnosis of SARS-CoV-2 by FDA under an Emergency Use Authorization (EUA). This EUA will remain in effect (meaning this test can be used) for the duration of the COVID-19 declaration under Section 564(b)(1) of the Act, 21 U.S.C. section 360bbb-3(b)(1), unless the authorization is terminated or revoked.  Performed at [facility], [address], [place], [place] [address]     Imaging: IMPRESSION: 1. Obstructing 7 mm left UVJ calculus, with moderate left-sided obstructive uropathy as well as left renal edema and perinephric fat stranding. Gas within the bladder, left ureter, and left renal collecting system could reflect infection with gas-forming organism or sequela of recent intervention. Please correlate with procedural history and urinalysis. 2. Other nonobstructing calculi within the right kidney, left kidney, and left ureter as  above. 3. Cholelithiasis and gallbladder sludge. No evidence of acute cholecystitis. 4. Hepatic steatosis. 5. Trace pelvic free fluid, likely reactive. 6.  Aortic Atherosclerosis (ICD10-I70.0).  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  We discussed risk benefits alternatives to ureteral stent.  This included bleeding infection and damage to surrounding structures surrounding structures including ureter as well as urethra.  We discussed the need for stent postoperatively as well as the potential symptoms of stent placement.  We discussed possible inability to complete procedure due to caliber of your ureter or inability to pass stone possibly requiring long-term stent versus nephrostomy tube. Possible need for ICU care post operatively. We discussed need for possible second surgery.  Patient voiced their understanding and consent was obtained.   Spoke with Gwendel Hanson POA for consent.   Recommendations: Keep n.p.o. Patient was received vancomycin and cefepime Plan for left ureteral stent placement Consult medicine for admission   Thank you for involving me in this patient's care, I will continue to follow along.Please page with any further questions or concerns. Adonis Brook

## 2023-05-28 NOTE — ED Notes (Signed)
POA Deanna Shea [phone] would like an update asap

## 2023-05-28 NOTE — Anesthesia Preprocedure Evaluation (Addendum)
Anesthesia Evaluation  Patient identified by MRN, date of birth, ID band Patient confused    Reviewed: Allergy & Precautions, H&P , Patient's Chart, lab work & pertinent test results  Airway Mallampati: III  TM Distance: >3 FB Neck ROM: Full    Dental  (+) Teeth Intact, Dental Advisory Given   Pulmonary neg pulmonary ROS   breath sounds clear to auscultation       Cardiovascular + dysrhythmias (history of Afib, in NSR per last EKG) Atrial Fibrillation + Valvular Problems/Murmurs MVP  Rhythm:Regular Rate:Normal  Echo 2023  1. Left ventricular ejection fraction, by estimation, is 60 to 65%. The left ventricle has normal function. The left ventricle has no regional wall motion abnormalities. There is mild concentric left ventricular hypertrophy. Left ventricular diastolic parameters were normal.   2. Right ventricular systolic function is normal. The right ventricular size is normal.   3. The mitral valve is normal in structure. There is mild bileaflet proplapse. Mild mitral valve regurgitation. No evidence of mitral stenosis.   4. The aortic valve is tricuspid. Aortic valve regurgitation is not visualized. No aortic stenosis is present.   5. The inferior vena cava is normal in size with greater than 50% respiratory variability, suggesting right atrial pressure of 3 mmHg.   Comparison(s): Compared to prior TTE report in 2010, there is no  significant change.      Neuro/Psych  PSYCHIATRIC DISORDERS Anxiety Depression  Schizophrenia     GI/Hepatic negative GI ROS, Neg liver ROS,,,Small bowel obstruction- NG tube in place   Endo/Other  diabetesHypothyroidism    Renal/GU Renal disease     Musculoskeletal negative musculoskeletal ROS (+)    Abdominal   Peds  Hematology negative hematology ROS (+)   Anesthesia Other Findings   Reproductive/Obstetrics                             Anesthesia  Physical Anesthesia Plan  ASA: 4 and emergent  Anesthesia Plan: General   Post-op Pain Management: Tylenol PO (pre-op)*   Induction: Intravenous, Rapid sequence and Cricoid pressure planned  PONV Risk Score and Plan: 4 or greater and Ondansetron and Treatment may vary due to age or medical condition  Airway Management Planned: Oral ETT  Additional Equipment: Arterial line  Intra-op Plan:   Post-operative Plan: Possible Post-op intubation/ventilation  Informed Consent: I have reviewed the patients History and Physical, chart, labs and discussed the procedure including the risks, benefits and alternatives for the proposed anesthesia with the patient or authorized representative who has indicated his/her understanding and acceptance.     Dental advisory given and Consent reviewed with POA  Plan Discussed with: CRNA  Anesthesia Plan Comments:         Anesthesia Quick Evaluation

## 2023-05-28 NOTE — Op Note (Signed)
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 Vilma Prader  Findings:  No tumors noted in the bladder no irregularities Left retrograde pyelogram performed 6 x 26 double-J stent placed in the left ureter  Left retrograde pyelogram findings: No filling defects within the left collecting system.  Specimens:left renal pelvis urine culture  Indication: Deanna Shea is a 78 y.o. patient was in the ER with altered mental status with CT demonstrating left obstructing ureteral stone and left emphysematous pyelitis after reviewing the management options for treatment, he elected to proceed with the above surgical procedure(s). We have discussed the potential benefits and risks of the procedure, side effects of the proposed treatment, the likelihood of the patient achieving the goals of the procedure, and any potential problems that might occur during the procedure or recuperation. Informed consent has been obtained.  Procedure in detail: Patient was consented prior to being brought back to the operating room. He was then brought back to the operating room placed on the table in supine position. General anesthesia was then induced and endotracheal tube inserted. This then placed in dorsolithotomy position and prepped and draped in the routine sterile fashion. A timeout was held.  Using a 22.5 [place] cystoscope with a 30  lens, I gently passed the scope into the patient's urethra and into the bladder under visual guidance. A 360 cystoscopic evaluation was performed with no mucosal abnormalities, no tumors, and no foreign bodies identified. I then passed a 0. 038 Sensor wire into the left ureteral orifice and into the left renal pelvis. I then slid a 5 [place] open-ended ureteral catheter over the wire and  into the renal pelvis and removed the wire.   I then aspirated 10 cc of urine which was sent for culture. Next I injected 10 cc of contrast into the renal collecting system and performed a retrograde pyelogram. I then replaced the wire through the open-ended ureteral catheter and remove the catheter. I then slid a 26 cm x 6 French double-J ureteral stent over the wire and into the renal pelvis under fluoroscopic guidance. Once a nice curl was noted in the renal pelvis I advanced the distal end of the stent into the bladder before removing the wire completely. A final fluoroscopic image was obtained confirming the curl in the renal pelvis as well as a curl in the bladder.   A 60 French catheter with 10 cc in the balloon was placed at the end the case.  Disposition: During the case the patient was noted to be significantly unstable patient was left intubated and sent to the ICU for further management.  Urology will continue to follow recommend continuing broad-spectrum antibiotics and continuing Foley catheter until afebrile and out of the ICU.

## 2023-05-28 NOTE — ED Provider Notes (Addendum)
[place] EMERGENCY DEPARTMENT AT [facility] Provider Note   CSN: [id number] Arrival date & time: 05/28/23  1811     History  Chief Complaint  Patient presents with   Altered Mental Status    Deanna Shea is a 78 y.o. female.   Altered Mental Status Patient presents for fatigue and altered mental status.  Medical history includes atrial fibrillation, SBO, DM, hypothyroidism, anxiety, depression, osteopenia, schizophrenia.  She arrives via EMS from assisted living facility.  Per EMS, staff report that she is active and participating in activities at facility.  Today, she has been staying in her room.  She asked for Tylenol for diffuse myalgias.  She has been disoriented.  EMS noted that patient seemed warm to touch.  She had a heart rate in the 80s but is on a beta-blocker.  She was normotensive and SpO2 is normal on room air.  Patient currently denies any areas of pain or discomfort.     Home Medications Prior to Admission medications   Medication Sig Start Date End Date Taking? Authorizing Provider  acetaminophen (TYLENOL) 325 MG tablet Take 2 tablets (650 mg total) by mouth every 6 (six) hours as needed for mild pain (or Fever >/= 101). 01/05/23   Osvaldo Shipper, MD  aspirin EC 81 MG tablet Take 81 mg by mouth daily.    [provider]  digoxin (LANOXIN) 0.25 MG tablet TAKE 1 TABLET BY MOUTH EVERY OTHER DAY 03/01/23   Corky Crafts, MD  escitalopram (LEXAPRO) 20 MG tablet Take 20 mg by mouth daily.  10/08/15   [provider]  levothyroxine (SYNTHROID) 88 MCG tablet Take 88 mcg by mouth daily before breakfast.    [provider]  metFORMIN (GLUCOPHAGE-XR) 500 MG 24 hr tablet Take 500 mg by mouth in the morning and at bedtime.    [provider]  propranolol (INDERAL) 20 MG tablet TAKE 1 TABLET BY MOUTH 4 TIMES DAILY. 03/01/23   Corky Crafts, MD  risperiDONE (RISPERDAL) 3 MG tablet Take 3 mg by mouth at bedtime.     [provider]  rosuvastatin (CRESTOR) 5 MG tablet Take 2.5 mg by mouth daily. 11/30/19   [provider]      Allergies    Sulfa antibiotics    Review of Systems   Review of Systems  Unable to perform ROS: Mental status change    Physical Exam Updated Vital Signs BP (!) 108/50   Pulse 99   Temp 98.5 F (36.9 C) (Oral)   Resp (!) 30   SpO2 97%  Physical Exam Vitals and nursing note reviewed.  Constitutional:      General: She is not in acute distress.    Appearance: Normal appearance. She is well-developed. She is not ill-appearing, toxic-appearing or diaphoretic.  HENT:     Head: Normocephalic and atraumatic.     Right Ear: External ear normal.     Left Ear: External ear normal.     Nose: Nose normal.     Mouth/Throat:     Mouth: Mucous membranes are moist.  Eyes:     Extraocular Movements: Extraocular movements intact.     Conjunctiva/sclera: Conjunctivae normal.  Cardiovascular:     Rate and Rhythm: Normal rate and regular rhythm.     Heart sounds: No murmur heard. Pulmonary:     Effort: Pulmonary effort is normal. Tachypnea present. No respiratory distress.     Breath sounds: Normal breath sounds. No decreased breath sounds, wheezing, rhonchi  or rales.  Abdominal:     General: There is no distension.     Palpations: Abdomen is soft.     Tenderness: There is abdominal tenderness.  Musculoskeletal:        General: No swelling or deformity.     Cervical back: Normal range of motion and neck supple.  Skin:    General: Skin is warm and dry.     Coloration: Skin is not jaundiced or pale.  Neurological:     General: No focal deficit present.     Mental Status: She is alert. She is disoriented.  Psychiatric:        Mood and Affect: Mood normal.        Speech: Speech is delayed.        Behavior: Behavior is slowed and withdrawn. Behavior is cooperative.     ED Results / Procedures / Treatments   Labs (all labs ordered are listed, but only  abnormal results are displayed) Labs Reviewed  COMPREHENSIVE METABOLIC PANEL - Abnormal; Notable for the following components:      Result Value   Sodium 126 (*)    Chloride 92 (*)    CO2 14 (*)    Glucose, Bld 501 (*)    BUN 32 (*)    Creatinine, Ser 2.48 (*)    Calcium 8.5 (*)    Albumin 3.3 (*)    AST 49 (*)    Total Bilirubin 1.5 (*)    GFR, Estimated 20 (*)    Anion gap 20 (*)    All other components within normal limits  CBC WITH DIFFERENTIAL/PLATELET - Abnormal; Notable for the following components:   RBC 5.26 (*)    Hemoglobin 15.4 (*)    HCT 46.7 (*)    Platelets 121 (*)    Lymphs Abs 0.4 (*)    Eosinophils Absolute 0.6 (*)    Abs Immature Granulocytes 0.11 (*)    All other components within normal limits  PROTIME-INR - Abnormal; Notable for the following components:   Prothrombin Time 17.4 (*)    INR 1.4 (*)    All other components within normal limits  URINALYSIS, W/ REFLEX TO CULTURE (INFECTION SUSPECTED) - Abnormal; Notable for the following components:   Color, Urine AMBER (*)    APPearance CLOUDY (*)    Glucose, UA >=500 (*)    Hgb urine dipstick MODERATE (*)    Ketones, ur 5 (*)    Protein, ur 100 (*)    Leukocytes,Ua MODERATE (*)    Bacteria, UA MANY (*)    Non Squamous Epithelial 6-10 (*)    All other components within normal limits  MAGNESIUM - Abnormal; Notable for the following components:   Magnesium 1.4 (*)    All other components within normal limits  BETA-HYDROXYBUTYRIC ACID - Abnormal; Notable for the following components:   Beta-Hydroxybutyric Acid 1.19 (*)    All other components within normal limits  OSMOLALITY - Abnormal; Notable for the following components:   Osmolality 309 (*)    All other components within normal limits  I-STAT CG4 LACTIC ACID, ED - Abnormal; Notable for the following components:   Lactic Acid, Venous 5.5 (*)    All other components within normal limits  I-STAT CHEM 8, ED - Abnormal; Notable for the following  components:   Sodium 129 (*)    Chloride 95 (*)    BUN 32 (*)    Creatinine, Ser 2.30 (*)    Glucose, Bld 495 (*)  Calcium, Ion 1.05 (*)    TCO2 19 (*)    Hemoglobin 16.7 (*)    HCT 49.0 (*)    All other components within normal limits  I-STAT CG4 LACTIC ACID, ED - Abnormal; Notable for the following components:   Lactic Acid, Venous 6.5 (*)    All other components within normal limits  TROPONIN I (HIGH SENSITIVITY) - Abnormal; Notable for the following components:   Troponin I (High Sensitivity) 34 (*)    All other components within normal limits  RESP PANEL BY RT-PCR (RSV, FLU A&B, COVID)  RVPGX2  CULTURE, BLOOD (ROUTINE X 2)  CULTURE, BLOOD (ROUTINE X 2)  BRAIN NATRIURETIC PEPTIDE  CK  DIGOXIN LEVEL  AMMONIA  TSH  CBG MONITORING, ED  CBG MONITORING, ED  TROPONIN I (HIGH SENSITIVITY)    EKG EKG Interpretation Date/Time:  Monday May 28 2023 18:21:15 EST Ventricular Rate:  90 PR Interval:  160 QRS Duration:  92 QT Interval:  461 QTC Calculation: 565 R Axis:   60  Text Interpretation: Sinus rhythm Prolonged QT interval Confirmed by Gloris Manchester ([id number]) on 05/28/2023 7:06:01 PM  Radiology CT CHEST ABDOMEN PELVIS WO CONTRAST Result Date: 05/28/2023 CLINICAL DATA:  Sepsis EXAM: CT CHEST, ABDOMEN AND PELVIS WITHOUT CONTRAST TECHNIQUE: Multidetector CT imaging of the chest, abdomen and pelvis was performed following the standard protocol without IV contrast. RADIATION DOSE REDUCTION: This exam was performed according to the departmental dose-optimization program which includes automated exposure control, adjustment of the mA and/or kV according to patient size and/or use of iterative reconstruction technique. COMPARISON:  01/05/2023 FINDINGS: CT CHEST FINDINGS Cardiovascular: Unenhanced imaging of the heart is unremarkable without pericardial effusion. Normal caliber of the thoracic aorta. Evaluation of the vascular lumen is limited without IV contrast. Mediastinum/Nodes: No  enlarged mediastinal, hilar, or axillary lymph nodes. Thyroid gland, trachea, and esophagus demonstrate no significant findings. Lungs/Pleura: Dependent hypoventilatory changes within the lower lobes. No acute airspace disease, effusion, or pneumothorax. The central airways are patent. Musculoskeletal: No acute or destructive bony abnormalities. Reconstructed images demonstrate no additional findings. CT ABDOMEN PELVIS FINDINGS Hepatobiliary: Diffuse hepatic steatosis. Calcified gallstone and gallbladder sludge without evidence of acute cholecystitis. No biliary duct dilation. Pancreas: Unremarkable unenhanced appearance. Spleen: Unremarkable unenhanced appearance. Adrenals/Urinary Tract: There is an obstructing 7 mm left UVJ calculus, reference image 115/3. There is an additional nonobstructing 9 mm calculus within the distal left ureter reference image 103/3. Multiple nonobstructing left renal calculi are identified measuring up to 9 mm. There is moderate left-sided hydronephrosis and hydroureter. Left kidney is enlarged with diffuse renal edema and prominent perinephric fat stranding. Gas lucencies are identified within the upper pole calices, proximal left ureter, and bladder. If there has been no recent intervention, infection with gas-forming organism is suspected. Please correlate with urinalysis. Nonobstructing faint calcifications are seen within the mid and lower pole calices of the right kidney. No right-sided obstruction. The adrenals are unremarkable. Stomach/Bowel: No bowel obstruction or ileus. The appendix is surgically absent. Prior small bowel resection and reanastomosis. No bowel wall thickening or inflammatory change. Vascular/Lymphatic: Aortic atherosclerosis. No enlarged abdominal or pelvic lymph nodes. Reproductive: Uterus and bilateral adnexa are unremarkable. Other: Trace pelvic free fluid. No free intraperitoneal gas. No abdominal wall hernia. Musculoskeletal: No acute or destructive bony  abnormalities. Reconstructed images demonstrate no additional findings. IMPRESSION: 1. Obstructing 7 mm left UVJ calculus, with moderate left-sided obstructive uropathy as well as left renal edema and perinephric fat stranding. Gas within the bladder, left ureter, and left  renal collecting system could reflect infection with gas-forming organism or sequela of recent intervention. Please correlate with procedural history and urinalysis. 2. Other nonobstructing calculi within the right kidney, left kidney, and left ureter as above. 3. Cholelithiasis and gallbladder sludge. No evidence of acute cholecystitis. 4. Hepatic steatosis. 5. Trace pelvic free fluid, likely reactive. 6.  Aortic Atherosclerosis (ICD10-I70.0). Electronically Signed   By: Sharlet Salina M.D.   On: 05/28/2023 19:58   CT Head Wo Contrast Result Date: 05/28/2023 CLINICAL DATA:  Altered level of consciousness, sepsis EXAM: CT HEAD WITHOUT CONTRAST TECHNIQUE: Contiguous axial images were obtained from the base of the skull through the vertex without intravenous contrast. RADIATION DOSE REDUCTION: This exam was performed according to the departmental dose-optimization program which includes automated exposure control, adjustment of the mA and/or kV according to patient size and/or use of iterative reconstruction technique. COMPARISON:  01/02/2023 FINDINGS: Brain: Imaging is limited due to patient motion throughout the study. No evidence of acute infarct or hemorrhage. Lateral ventricles and midline structures are unremarkable. No acute extra-axial fluid collections. No mass effect. Vascular: No hyperdense vessel or unexpected calcification. Skull: Normal. Negative for fracture or focal lesion. Sinuses/Orbits: Significant mucosal thickening within the left maxillary sinus unchanged. Stable opacification of the right sphenoid sinus. Other: None. IMPRESSION: 1. Limited study due to patient motion. 2. No acute intracranial process. 3. 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 Electronically Signed   By: Sharlet Salina M.D.   On: 05/28/2023 19:51   DG Chest Port 1 View Result Date: 05/28/2023 CLINICAL DATA:  Questionable sepsis. EXAM: PORTABLE CHEST 1 VIEW COMPARISON:  01/28/2014 FINDINGS: Heart and mediastinal contours are within normal limits. No focal opacities or effusions. No acute bony abnormality. IMPRESSION: No active disease. Electronically Signed   By: Charlett Nose M.D.   On: 05/28/2023 18:43    Procedures Procedures    Medications Ordered in ED Medications  lactated ringers infusion ( Intravenous New Bag/Given 05/28/23 1945)  vancomycin (VANCOREADY) IVPB 1500 mg/300 mL (has no administration in time range)  magnesium sulfate IVPB 2 g 50 mL (has no administration in time range)  insulin regular, human (MYXREDLIN) 100 units/ 100 mL infusion (has no administration in time range)  lactated ringers infusion (has no administration in time range)  dextrose 5 % in lactated ringers infusion (has no administration in time range)  dextrose 50 % solution 0-50 mL (has no administration in time range)  potassium chloride 10 mEq in 100 mL IVPB (has no administration in time range)  norepinephrine (LEVOPHED) 4mg  in (0.016 mg/mL) premix infusion (has no administration in time range)  lactated ringers bolus 500 mL (500 mLs Intravenous New Bag/Given 05/28/23 1945)  acetaminophen (TYLENOL) tablet 650 mg (650 mg Oral Given 05/28/23 1900)  lactated ringers bolus 1,000 mL (0 mLs Intravenous Stopped 05/28/23 1945)  ceFEPIme (MAXIPIME) 2 g in sodium chloride 0.9 % 100 mL IVPB (0 g Intravenous Stopped 05/28/23 1935)  metroNIDAZOLE (FLAGYL) IVPB 500 mg (0 mg Intravenous Stopped 05/28/23 2048)  lactated ringers bolus 500 mL (500 mLs Intravenous New Bag/Given 05/28/23 1945)    ED Course/ Medical Decision Making/ A&P                                 Medical Decision Making Amount and/or Complexity of Data Reviewed Labs:  ordered. Radiology: ordered.  Risk OTC drugs. Prescription drug management. Decision regarding hospitalization.   This  patient presents to the ED for concern of altered mental status, this involves an extensive number of treatment options, and is a complaint that carries with it a high risk of complications and morbidity.  The differential diagnosis includes polypharmacy, medication withdrawal, CVA, seizure, ICH, infection, metabolic derangements   Co morbidities that complicate the patient evaluation  atrial fibrillation, SBO, DM, hypothyroidism, anxiety, depression, osteopenia, schizophrenia   Additional history obtained:  Additional history obtained from EMS External records from outside source obtained and reviewed including EMR   Lab Tests:  I Ordered, and personally interpreted labs.  The pertinent results include: Multiple acute findings: AKI; hyperglycemia with anion gap and mild elevation in serum ketones concerning for DKA; hypomagnesemia; evidence of UTI   Imaging Studies ordered:  I ordered imaging studies including chest x-ray, CT of head, chest, abdomen, pelvis I independently visualized and interpreted imaging which showed obstructing 7 mm left UVJ calculus; left renal edema and perinephric fat stranding I agree with the radiologist interpretation   Cardiac Monitoring: / EKG:  The patient was maintained on a cardiac monitor.  I personally viewed and interpreted the cardiac monitored which showed an underlying rhythm of: Sinus rhythm   Consultations Obtained:  I requested consultation with the urologist, Dr. Jennette Bill,  and discussed lab and imaging findings as well as pertinent plan - they recommend: Admission to medicine, planned operative intervention tonight   Problem List / ED Course / Critical interventions / Medication management  Patient presents for altered mental status.  She arrives from assisted living facility who noted that symptoms appear to  begin this morning.  She is typically alert and oriented, ambulatory, and active.  She has been lethargic today.  On arrival, patient is slow to respond and follow commands.  She is moving all extremities.  She is disoriented.  Vital signs are notable for soft blood pressures, tachypnea, and fever, identified on her rectal temperature.  IV fluids and broad-spectrum antibiotics were ordered for empiric treatment of sepsis.  Workup was initiated.  Lab findings are notable for hyperglycemia with evidence of DKA.  Insulin GTT was ordered.  Patient's initial lactic acid is 5.5, concerning for severe sepsis.  AKI is present.  On CT imaging, patient has evidence of obstructive uropathy.  Urinalysis is consistent with acute infection.  I spoke with urologist on-call, Dr. Jennette Bill, who plans on operative intervention.  Despite 2 L of IV fluids, patient's blood pressure softened.  She had several readings of maps in the 50s.  She was started on Levophed.  She remains lethargic.  Patient was admitted to ICU for further management. I ordered medication including IV fluids and broad-spectrum antibiotics for empiric treatment of sepsis; insulin gtt. for DKA; Tylenol for antipyresis; magnesium sulfate for hypomagnesemia Reevaluation of the patient after these medicines showed that the patient improved I have reviewed the patients home medicines and have made adjustments as needed   Social Determinants of Health:  Resides in assisted living facility  CRITICAL CARE Performed by: Gloris Manchester   Total critical care time: 34 minutes  Critical care time was exclusive of separately billable procedures and treating other patients.  Critical care was necessary to treat or prevent imminent or life-threatening deterioration.  Critical care was time spent personally by me on the following activities: development of treatment plan with patient and/or surrogate as well as nursing, discussions with consultants, evaluation of  patient's response to treatment, examination of patient, obtaining history from patient or surrogate, ordering and performing treatments and  interventions, ordering and review of laboratory studies, ordering and review of radiographic studies, pulse oximetry and re-evaluation of patient's condition.       Final Clinical Impression(s) / ED Diagnoses Final diagnoses:  AKI (acute kidney injury) (HCC)  Diabetic ketoacidosis without coma associated with type 2 diabetes mellitus (HCC)  Ureterolithiasis  Septic shock (HCC)  Acute encephalopathy    Rx / DC Orders ED Discharge Orders     None            Gloris Manchester, MD 05/28/23 2032    Gloris Manchester, MD 05/28/23 2104

## 2023-05-29 ENCOUNTER — Encounter (HOSPITAL_COMMUNITY): Payer: Self-pay | Admitting: Urology

## 2023-05-29 DIAGNOSIS — I480 Paroxysmal atrial fibrillation: Secondary | ICD-10-CM

## 2023-05-29 DIAGNOSIS — N179 Acute kidney failure, unspecified: Secondary | ICD-10-CM | POA: Diagnosis not present

## 2023-05-29 DIAGNOSIS — N390 Urinary tract infection, site not specified: Secondary | ICD-10-CM | POA: Diagnosis not present

## 2023-05-29 DIAGNOSIS — E1110 Type 2 diabetes mellitus with ketoacidosis without coma: Secondary | ICD-10-CM | POA: Diagnosis not present

## 2023-05-29 DIAGNOSIS — G9341 Metabolic encephalopathy: Secondary | ICD-10-CM

## 2023-05-29 DIAGNOSIS — A419 Sepsis, unspecified organism: Secondary | ICD-10-CM | POA: Diagnosis not present

## 2023-05-29 LAB — LACTIC ACID, PLASMA
Lactic Acid, Venous: 4.6 mmol/L (ref 0.5–1.9)
Lactic Acid, Venous: 6.3 mmol/L (ref 0.5–1.9)

## 2023-05-29 LAB — POCT I-STAT 7, (LYTES, BLD GAS, ICA,H+H)
Acid-base deficit: 11 mmol/L — ABNORMAL HIGH (ref 0.0–2.0)
Acid-base deficit: 4 mmol/L — ABNORMAL HIGH (ref 0.0–2.0)
Bicarbonate: 14.8 mmol/L — ABNORMAL LOW (ref 20.0–28.0)
Bicarbonate: 20.5 mmol/L (ref 20.0–28.0)
Calcium, Ion: 1.12 mmol/L — ABNORMAL LOW (ref 1.15–1.40)
Calcium, Ion: 1.13 mmol/L — ABNORMAL LOW (ref 1.15–1.40)
HCT: 37 % (ref 36.0–46.0)
HCT: 38 % (ref 36.0–46.0)
Hemoglobin: 12.6 g/dL (ref 12.0–15.0)
Hemoglobin: 12.9 g/dL (ref 12.0–15.0)
O2 Saturation: 99 %
O2 Saturation: 99 %
Patient temperature: 36.9
Patient temperature: 99.9
Potassium: 3.5 mmol/L (ref 3.5–5.1)
Potassium: 3.8 mmol/L (ref 3.5–5.1)
Sodium: 131 mmol/L — ABNORMAL LOW (ref 135–145)
Sodium: 135 mmol/L (ref 135–145)
TCO2: 16 mmol/L — ABNORMAL LOW (ref 22–32)
TCO2: 21 mmol/L — ABNORMAL LOW (ref 22–32)
pCO2 arterial: 33.4 mm[Hg] (ref 32–48)
pCO2 arterial: 34.5 mm[Hg] (ref 32–48)
pH, Arterial: 7.253 — ABNORMAL LOW (ref 7.35–7.45)
pH, Arterial: 7.384 (ref 7.35–7.45)
pO2, Arterial: 124 mm[Hg] — ABNORMAL HIGH (ref 83–108)
pO2, Arterial: 133 mm[Hg] — ABNORMAL HIGH (ref 83–108)

## 2023-05-29 LAB — GLUCOSE, CAPILLARY
Glucose-Capillary: 125 mg/dL — ABNORMAL HIGH (ref 70–99)
Glucose-Capillary: 149 mg/dL — ABNORMAL HIGH (ref 70–99)
Glucose-Capillary: 150 mg/dL — ABNORMAL HIGH (ref 70–99)
Glucose-Capillary: 155 mg/dL — ABNORMAL HIGH (ref 70–99)
Glucose-Capillary: 157 mg/dL — ABNORMAL HIGH (ref 70–99)
Glucose-Capillary: 161 mg/dL — ABNORMAL HIGH (ref 70–99)
Glucose-Capillary: 173 mg/dL — ABNORMAL HIGH (ref 70–99)
Glucose-Capillary: 176 mg/dL — ABNORMAL HIGH (ref 70–99)
Glucose-Capillary: 182 mg/dL — ABNORMAL HIGH (ref 70–99)
Glucose-Capillary: 204 mg/dL — ABNORMAL HIGH (ref 70–99)
Glucose-Capillary: 205 mg/dL — ABNORMAL HIGH (ref 70–99)
Glucose-Capillary: 222 mg/dL — ABNORMAL HIGH (ref 70–99)
Glucose-Capillary: 236 mg/dL — ABNORMAL HIGH (ref 70–99)
Glucose-Capillary: 241 mg/dL — ABNORMAL HIGH (ref 70–99)
Glucose-Capillary: 248 mg/dL — ABNORMAL HIGH (ref 70–99)
Glucose-Capillary: 248 mg/dL — ABNORMAL HIGH (ref 70–99)
Glucose-Capillary: 254 mg/dL — ABNORMAL HIGH (ref 70–99)
Glucose-Capillary: 301 mg/dL — ABNORMAL HIGH (ref 70–99)
Glucose-Capillary: 337 mg/dL — ABNORMAL HIGH (ref 70–99)
Glucose-Capillary: 402 mg/dL — ABNORMAL HIGH (ref 70–99)

## 2023-05-29 LAB — BLOOD CULTURE ID PANEL (REFLEXED) - BCID2

## 2023-05-29 LAB — BASIC METABOLIC PANEL
Anion gap: 11 (ref 5–15)
Anion gap: 15 (ref 5–15)
BUN: 30 mg/dL — ABNORMAL HIGH (ref 8–23)
BUN: 30 mg/dL — ABNORMAL HIGH (ref 8–23)
CO2: 14 mmol/L — ABNORMAL LOW (ref 22–32)
CO2: 18 mmol/L — ABNORMAL LOW (ref 22–32)
Calcium: 7.6 mg/dL — ABNORMAL LOW (ref 8.9–10.3)
Calcium: 7.7 mg/dL — ABNORMAL LOW (ref 8.9–10.3)
Chloride: 101 mmol/L (ref 98–111)
Chloride: 99 mmol/L (ref 98–111)
Creatinine, Ser: 1.76 mg/dL — ABNORMAL HIGH (ref 0.44–1.00)
Creatinine, Ser: 1.83 mg/dL — ABNORMAL HIGH (ref 0.44–1.00)
GFR, Estimated: 28 mL/min — ABNORMAL LOW (ref >60)
GFR, Estimated: 30 mL/min — ABNORMAL LOW (ref >60)
Glucose, Bld: 274 mg/dL — ABNORMAL HIGH (ref 70–99)
Glucose, Bld: 414 mg/dL — ABNORMAL HIGH (ref 70–99)
Potassium: 3.8 mmol/L (ref 3.5–5.1)
Potassium: 4 mmol/L (ref 3.5–5.1)
Sodium: 128 mmol/L — ABNORMAL LOW (ref 135–145)
Sodium: 130 mmol/L — ABNORMAL LOW (ref 135–145)

## 2023-05-29 LAB — CBC
HCT: 37.3 % (ref 36.0–46.0)
HCT: 39.1 % (ref 36.0–46.0)
Hemoglobin: 12.8 g/dL (ref 12.0–15.0)
Hemoglobin: 12.9 g/dL (ref 12.0–15.0)
MCH: 29.3 pg (ref 26.0–34.0)
MCH: 29.9 pg (ref 26.0–34.0)
MCHC: 32.7 g/dL (ref 30.0–36.0)
MCHC: 34.6 g/dL (ref 30.0–36.0)
MCV: 86.3 fL (ref 80.0–100.0)
MCV: 89.5 fL (ref 80.0–100.0)
Platelets: 80 10*3/uL — ABNORMAL LOW (ref 150–400)
Platelets: 90 10*3/uL — ABNORMAL LOW (ref 150–400)
RBC: 4.32 MIL/uL (ref 3.87–5.11)
RBC: 4.37 MIL/uL (ref 3.87–5.11)
RDW: 12.7 % (ref 11.5–15.5)
RDW: 12.8 % (ref 11.5–15.5)
WBC: 4.8 10*3/uL (ref 4.0–10.5)
WBC: 8.1 10*3/uL (ref 4.0–10.5)
nRBC: 0 % (ref 0.0–0.2)
nRBC: 0 % (ref 0.0–0.2)

## 2023-05-29 LAB — TRIGLYCERIDES: Triglycerides: 114 mg/dL (ref <150)

## 2023-05-29 LAB — TYPE AND SCREEN
ABO/RH(D): O POS
Antibody Screen: NEGATIVE

## 2023-05-29 LAB — PHOSPHORUS: Phosphorus: 2.6 mg/dL (ref 2.5–4.6)

## 2023-05-29 LAB — TROPONIN I (HIGH SENSITIVITY): Troponin I (High Sensitivity): 45 ng/L — ABNORMAL HIGH (ref <18)

## 2023-05-29 LAB — MRSA NEXT GEN BY PCR, NASAL: MRSA by PCR Next Gen: NOT DETECTED

## 2023-05-29 LAB — MAGNESIUM: Magnesium: 1.8 mg/dL (ref 1.7–2.4)

## 2023-05-29 MED ORDER — MAGNESIUM SULFATE 2 GM/50ML IV SOLN
INTRAVENOUS | Status: AC
  Administered 2023-05-29: 2 g via INTRAVENOUS
  Filled 2023-05-29: qty 50

## 2023-05-29 MED ORDER — LACTATED RINGERS IV BOLUS
1000.0000 mL | Freq: Once | INTRAVENOUS | Status: AC
Start: 2023-05-29 — End: 2023-05-29
  Administered 2023-05-29: 1000 mL via INTRAVENOUS

## 2023-05-29 MED ORDER — HYDROCORTISONE SOD SUC (PF) 100 MG IJ SOLR: 100.0000 mg | Freq: Two times a day (BID) | INTRAMUSCULAR | Status: DC

## 2023-05-29 MED ORDER — POTASSIUM CHLORIDE 20 MEQ PO PACK
40.0000 meq | PACK | Freq: Once | ORAL | Status: AC
  Administered 2023-05-29: 40 meq
  Filled 2023-05-29: qty 2

## 2023-05-29 MED ORDER — CALCIUM GLUCONATE-NACL 2-0.675 GM/100ML-% IV SOLN
2.0000 g | Freq: Once | INTRAVENOUS | Status: AC
  Administered 2023-05-29: 2000 mg via INTRAVENOUS
  Filled 2023-05-29: qty 100

## 2023-05-29 MED ORDER — MAGNESIUM SULFATE 2 GM/50ML IV SOLN
2.0000 g | Freq: Once | INTRAVENOUS | Status: AC
  Filled 2023-05-29: qty 50

## 2023-05-29 MED ORDER — LACTATED RINGERS IV SOLN: INTRAVENOUS | Status: DC

## 2023-05-29 MED ORDER — SODIUM BICARBONATE 8.4 % IV SOLN
50.0000 meq | Freq: Once | INTRAVENOUS | Status: AC
  Administered 2023-05-29: 50 meq via INTRAVENOUS
  Filled 2023-05-29: qty 50

## 2023-05-29 MED ORDER — VASOPRESSIN 20 UNITS/100 ML INFUSION FOR SHOCK: 0.0000 [IU]/min | INTRAVENOUS | Status: DC

## 2023-05-29 MED ORDER — DEXTROSE IN LACTATED RINGERS 5 % IV SOLN: INTRAVENOUS | Status: DC

## 2023-05-29 NOTE — Progress Notes (Addendum)
NAME:  Deanna Shea, MRN:  [id number], DOB:  [birth year], LOS: 1 ADMISSION DATE:  05/28/2023 CONSULTATION DATE:  05/28/2023 REFERRING MD:  Durwin Nora - EDP CHIEF COMPLAINT:  Urosepsis   History of Present Illness:  78 year old woman who presented to [facility] ED 1/20 for AMS and lethargy. PMHx significant for Afib (on ASA/digoxin/propranolol), mitral valve prolapse, HLD, T2DM, nephrolithiasis, hypothyroidism, anxiety/depression, schizophrenia.  Patient initially presented to [facility] ED from her assisted living facility for AMS and lethargy x 1 day; she was reportedly confused to place/situation and hyperglycemic to 500s. At baseline, alert and oriented. On ED arrival, patient was afebrile, HR 99 (on BB), BP 108/50, RR 30, SpO2 97%. Labs were notable for WBC 6.7, Hgb 15.4 (baseline 12-15), Plt 121 (baseline 245-300). INR 1.4. Na 126, K 5.1, CO2 14, Cr 2.48 (baseline 0.6-1), glucose 501, Mg 1.4. CK 150. TSH 4.1. Dig level WNL. Trop 34 (likely demand-related), BNP 89. B-hydroxybutyrate 1.19. LA 5.5 > 6.5. UA with > 500 glucose, moderate Hgb, 5 ketones, 100 protein, moderate leuks. COVID/Flu/RSV negative and BCx sent. Empiric broad-spectrum antibiotics were started for presumed sepsis. CT Head NAICA, CT Chest/A/P demonstrated obstructing  7mm UVJ calculus with moderate L-sided obstructive uropathy, L renal edema and perinephric stranding; gas was noted in the bladder, L ureter and L renal collecting system. Urology was consulted with plan for L ureteral stent placement.  PCCM consulted for ICU admission postoperatively.  Pertinent Medical History:   Past Medical History:  Diagnosis Date   Allergic rhinitis    Anxiety    Atrial fibrillation (HCC) 10/28/2013   diagnoses in the 20's, well controlled with Digoxin and Inderal    Depression    Diabetes mellitus without complication (HCC)    Gall stones    Heart murmur    Kidney disease, medullary sponge    Kidney stones    Mitral valve disorders(424.0) 10/28/2013    Mitral valve prolapse    congenital    MRSA (methicillin resistant Staphylococcus aureus)    9/10   Osteopenia    dexa 4/09, improved but not resolved 2011 DEXA   Palpitations 10/28/2013   Schizophrenia (HCC)    Shingles    2000   Significant Hospital Events: Including procedures, antibiotic start and stop dates in addition to other pertinent events   1/20 - Presented to [facility] ED for AMS/lethargy. Found to have ?DKA, urosepsis. Elevated LA. CT A/P showed obstructing L UVJ calculus with obstructive uropathy. Uro consulted. Taken to OR for L ureteral stent. PCCM consulted for ICU admission.  Interim History / Subjective:  Patient spiked fever with Tmax 101.1 Continue to require vasopressor support with Levophed On insulin infusion for DKA Lactate is trending down  Objective:  Blood pressure (!) 128/56, pulse 91, temperature 99.9 F (37.7 C), temperature source Bladder, resp. rate (!) 26, height 5\' 7"  (1.702 m), weight 79.2 kg, SpO2 100%.    Vent Mode: PSV;CPAP FiO2 (%):  [40 %-60 %] 40 % Set Rate:  [18 bmp] 18 bmp Vt Set:  [490 mL] 490 mL PEEP:  [5 cmH20] 5 cmH20 Pressure Support:  [8 cmH20] 8 cmH20 Plateau Pressure:  [17 cmH20] 17 cmH20   Intake/Output Summary (Last 24 hours) at 05/29/2023 0829 Last data filed at 05/29/2023 0805 Gross per 24 hour  Intake 5628.88 ml  Output 750 ml  Net 4878.88 ml   Filed Weights   05/28/23 2202 05/28/23 2331 05/29/23 0122  Weight: 73.9 kg 79.2 kg 79.2 kg   Physical Examination: General: Crtitically ill-appearing female,  orally intubated HEENT: Long/AT, eyes anicteric.  ETT and OGT in place Neuro: Opens eyes with vocal stimuli, following simple commands, generalized weak Chest: Coarse breath sounds, no wheezes or rhonchi Heart: Regular rate and rhythm, no murmurs or gallops Abdomen: Soft, nondistended, bowel sounds present Skin: No rash  Labs and images reviewed  Resolved Hospital Problem List:    Assessment & Plan:  Severe sepsis  with septic shock due to complicated gram-negative urinary tract infection with obstructive uropathy, POA Obstructive uropathy due to obstructing L UVJ calculus status post stent Gram-negative bacteremia Continue IV ceftriaxone Follow-up culture sensitivity and adjust antibiotic accordingly Currently requiring Levophed at 16 mics, titrate with MAP goal 65 Appreciate urology consult, patient underwent stent placement Blood cultures are growing gram-negative bacteremia likely source is UTI Trend lactate  Acute respiratory insufficiency, postprocedure Continue lung protective ventilation Patient is tolerating spontaneous breathing trial VAP prevention bundle in place PAD protocol with low-dose propofol  Diabetic ketoacidosis Continue insulin infusion per DKA protocol She takes metformin at home, will hold for now Continue IV fluid Monitor fingerstick Monitor BMP  Acute metabolic/septic encephalopathy Patient presented with altered mental status, she is here with septic shock also she has DKA Minimize sedation  AKI in the setting of obstructive uropathy and septic ATN AGMA Hyperkalemia, resolved Hypomagnesemia Hyponatremia/hypocalcemia Serum creatinine is trending down, down from 2.4-1.7 Continue aggressive IV fluid resuscitation Monitor intake and output Avoid nephrotoxic agent Continue aggressive electrolyte replacement  Paroxysmal A-fib Patient is in sinus rhythm Continue aspirin for stroke prophylaxis  Hypothyroidism Continue home Synthroid  Depression Anxiety Schizophrenia Continue risperidone, Lexapro as clinically appropriate  Thrombocytopenia due to critical illness Closely monitor platelet count Watch for signs of bleeding  Best Practice: (right click and "Reselect all SmartList Selections" daily)   Diet/type: NPO DVT prophylaxis: Heparin subcu GI prophylaxis: N/A Lines: Yes, still needed Foley:  Yes, and it is still needed Code Status:  full  code Last date of multidisciplinary goals of care discussion [Pending]  Labs:  CBC: Recent Labs  Lab 05/28/23 1829 05/28/23 1836 05/28/23 2208 05/28/23 2217 05/28/23 2346 05/29/23 0019 05/29/23 0429  WBC 6.7  --   --   --  4.8  --  8.1  NEUTROABS 5.4  --   --   --   --   --   --   HGB 15.4*   < > 12.2 12.2 12.8 12.9 12.9  HCT 46.7*   < > 36.0 36.0 39.1 38.0 37.3  MCV 88.8  --   --   --  89.5  --  86.3  PLT 121*  --   --   --  80*  --  90*   < > = values in this interval not displayed.   Basic Metabolic Panel: Recent Labs  Lab 05/28/23 1829 05/28/23 1836 05/28/23 2208 05/28/23 2217 05/28/23 2346 05/29/23 0019 05/29/23 0429  NA 126* 129* 131* 132* 128* 131* 130*  K 5.1 4.9 4.0 4.0 3.8 3.8 4.0  CL 92* 95*  --  97* 99  --  101  CO2 14*  --   --   --  14*  --  18*  GLUCOSE 501* 495*  --  429* 414*  --  274*  BUN 32* 32*  --  28* 30*  --  30*  CREATININE 2.48* 2.30*  --  1.80* 1.83*  --  1.76*  CALCIUM 8.5*  --   --   --  7.6*  --  7.7*  MG 1.4*  --   --   --   --   --  1.8  PHOS  --   --   --   --   --   --  2.6   GFR: Estimated Creatinine Clearance: 29.9 mL/min (A) (by C-G formula based on SCr of 1.76 mg/dL (H)). Recent Labs  Lab 05/28/23 1829 05/28/23 1837 05/28/23 2054 05/28/23 2346 05/29/23 0429 05/29/23 0435  WBC 6.7  --   --  4.8 8.1  --   LATICACIDVEN  --  5.5* 6.5* 6.3*  --  4.6*   Liver Function Tests: Recent Labs  Lab 05/28/23 1829  AST 49*  ALT 40  ALKPHOS 57  BILITOT 1.5*  PROT 6.5  ALBUMIN 3.3*   No results for input(s): "LIPASE", "AMYLASE" in the last 168 hours. Recent Labs  Lab 05/28/23 1820  AMMONIA 19   ABG:    Component Value Date/Time   PHART 7.253 (L) 05/29/2023 0019   PCO2ART 33.4 05/29/2023 0019   PO2ART 133 (H) 05/29/2023 0019   HCO3 14.8 (L) 05/29/2023 0019   TCO2 16 (L) 05/29/2023 0019   ACIDBASEDEF 11.0 (H) 05/29/2023 0019   O2SAT 99 05/29/2023 0019    Coagulation Profile: Recent Labs  Lab 05/28/23 1829  INR  1.4*   Cardiac Enzymes: Recent Labs  Lab 05/28/23 1829  CKTOTAL 150   HbA1C: Hgb A1c MFr Bld  Date/Time Value Ref Range Status  01/03/2023 02:23 PM 7.0 (H) 4.8 - 5.6 % Final    Comment:    (NOTE) Pre diabetes:          5.7%-6.4%  Diabetes:              >6.4%  Glycemic control for   <7.0% adults with diabetes    CBG: Recent Labs  Lab 05/29/23 0327 05/29/23 0424 05/29/23 0531 05/29/23 0629 05/29/23 0801  GLUCAP 222* 254* 241* 248* 236*    The patient is critically ill due to severe sepsis with septic shock.  Critical care was necessary to treat or prevent imminent or life-threatening deterioration.  Critical care was time spent personally by me on the following activities: development of treatment plan with patient and/or surrogate as well as nursing, discussions with consultants, evaluation of patient's response to treatment, examination of patient, obtaining history from patient or surrogate, ordering and performing treatments and interventions, ordering and review of laboratory studies, ordering and review of radiographic studies, pulse oximetry, re-evaluation of patient's condition and participation in multidisciplinary rounds.   During this encounter critical care time was devoted to patient care services described in this note for 42 minutes.     Cheri Fowler, MD [facility] See Amion for pager If no response to pager, please call [phone] until 7pm After 7pm, Please call E-link [phone]

## 2023-05-29 NOTE — Progress Notes (Signed)
Initial Nutrition Assessment  DOCUMENTATION CODES:  Not applicable  INTERVENTION:  If pt not able to be extubated and transitioned off insulin drip, recommend the following tube feeding regimen via OGT: Osmolite 1.2 at 60 ml/h (1440 ml per day) Start at 30 and advance by 10mL q8h to goal Provides 1728 kcal, 80 gm protein, 1181 ml free water daily  NUTRITION DIAGNOSIS:  Inadequate oral intake related to inability to eat as evidenced by NPO status.  GOAL:  Patient will meet greater than or equal to 90% of their needs  MONITOR:  I & O's, Vent status, Labs, Weight trends  REASON FOR ASSESSMENT:  Ventilator    ASSESSMENT:  Pt with hx of atrial fibrillation, DM type 2, and hx of SBO requiring resection presented to ED from her ALF with AMS and fatigue. Workup in ED consistent with severe sepsis due to UTI.   1/20 - Op, left ureteral stent placement, left intubated after surgery   Patient is currently intubated on ventilator support. No family in room at the time of assessment available to provide a hx. RN in room states provider is hopeful to be able to extubate. Will provide nutrition recommendations in the event that enteral nutrition is needed  MV: 9.5 L/min Temp (24hrs), Avg:100 F (37.8 C), Min:98.5 F (36.9 C), Max:101.2 F (38.4 C)  Propofol: 4.43 ml/hr  Admit weight: 73.9 kg  Current weight: 79.2 kg   Intake/Output Summary (Last 24 hours) at 05/29/2023 1455 Last data filed at 05/29/2023 1400 Gross per 24 hour  Intake 8118.02 ml  Output 1350 ml  Net 6768.02 ml  Net IO Since Admission: 6,768.02 mL [05/29/23 1455]  Drains/Lines: CVC Double Lumen Right IJ UOP out overnight  Nutritionally Relevant Medications: Scheduled Meds:  docusate  100 mg Per Tube BID   levothyroxine  88 mcg Per Tube Q0600   pantoprazole IV  40 mg Intravenous Daily   polyethylene glycol  17 g Per Tube Daily   potassium chloride  40 mEq Per Tube Once   rosuvastatin  2.5 mg Per Tube  Daily   Continuous Infusions:  calcium gluconate     cefTRIAXone (ROCEPHIN)  IV Stopped (05/29/23 4098)   dextrose 5% lactated ringers 125 mL/hr at 05/29/23 0800   insulin 13 Units/hr (05/29/23 0801)   magnesium sulfate     norepinephrine (LEVOPHED) Adult infusion 19 mcg/min (05/29/23 0805)   potassium chloride     propofol (DIPRIVAN) infusion 10 mcg/kg/min (05/29/23 0800)   PRN Meds: ondansetron  Labs Reviewed: BUN 30, creatinine 1.76 CBG ranges from 125-402 mg/dL over the last 24 hours HgbA1c 7.0%  NUTRITION - FOCUSED PHYSICAL EXAM: Flowsheet Row Most Recent Value  Orbital Region Mild depletion  Upper Arm Region No depletion  Thoracic and Lumbar Region No depletion  Buccal Region No depletion  Temple Region No depletion  Clavicle Bone Region No depletion  Clavicle and Acromion Bone Region No depletion  Scapular Bone Region No depletion  Dorsal Hand No depletion  Patellar Region No depletion  Anterior Thigh Region No depletion  Posterior Calf Region No depletion  Edema (RD Assessment) Severe  [generalized, BLE, BUE]  Hair Reviewed  Critias.Bumpers thin]  Eyes Reviewed  Mouth Reviewed  Skin Reviewed  Nails Reviewed   Diet Order:   Diet Order             Diet NPO time specified  Diet effective now  EDUCATION NEEDS:  Not appropriate for education at this time  Skin:  Skin Assessment: Reviewed RN Assessment  Last BM:  unsure  Height: Ht Readings from Last 1 Encounters:  05/28/23 5\' 7"  (1.702 m)    Weight:  Wt Readings from Last 1 Encounters:  05/29/23 79.2 kg    Ideal Body Weight:  61.4 kg  BMI:  Body mass index is 27.35 kg/m.  Estimated Nutritional Needs:  Kcal:  1600-1800 kcal/d Protein:  75-90g/d Fluid:  >/=1.8L/d    Greig Castilla, RD, LDN Registered Dietitian II Please reach out via secure chat Weekend on-call pager # available in [facility]

## 2023-05-29 NOTE — Progress Notes (Signed)
1 Day Post-Op Subjective: Pt vented and sedated. Still requires vasopressor support.   Objective: Vital signs in last 24 hours: Temp:  [98.5 F (36.9 C)-101.2 F (38.4 C)] 99.6 F (37.6 C) (01/21 1139) Pulse Rate:  [80-163] 84 (01/21 1315) Resp:  [20-39] 21 (01/21 1315) BP: (100-128)/(37-56) 128/56 (01/20 2300) SpO2:  [69 %-100 %] 100 % (01/21 1315) Arterial Line BP: (75-174)/(41-66) 174/42 (01/21 1315) FiO2 (%):  [40 %-60 %] 40 % (01/21 1125) Weight:  [73.9 kg-79.2 kg] 79.2 kg (01/21 0122)  Assessment/Plan: #sepsis #emphysematous pyelitis  #left ureteral stone  To the OR with Dr. Jennette Bill on 05/28/23 for urgent ureteral stent placement.  Still critical, requiring Levo. T-max 101.2 last night. Ok to remove foley from Urologic perspective once pt has been normothermic for 24 hrs and discharged from ICU. Definitive stone mgmt on an outpt basis.  Will see PRN  Intake/Output from previous day: 01/20 0701 - 01/21 0700 In: 5628.9 [I.V.:3351.7; IV Piggyback:2277.2] Out: 500 [Urine:500]  Intake/Output this shift: Total I/O In: 2351.8 [I.V.:1128.2; IV Piggyback:1223.6] Out: 850 [Urine:850]  Physical Exam:  General: Alert and oriented CV: No cyanosis Lungs: equal chest rise Abdomen: Soft, NTND, no rebound or guarding Gu: foley in place draining clear yellow urine  Lab Results: Recent Labs    05/29/23 0019 05/29/23 0429 05/29/23 0859  HGB 12.9 12.9 12.6  HCT 38.0 37.3 37.0   BMET Recent Labs    05/28/23 2346 05/29/23 0019 05/29/23 0429 05/29/23 0859  NA 128*   < > 130* 135  K 3.8   < > 4.0 3.5  CL 99  --  101  --   CO2 14*  --  18*  --   GLUCOSE 414*  --  274*  --   BUN 30*  --  30*  --   CREATININE 1.83*  --  1.76*  --   CALCIUM 7.6*  --  7.7*  --    < > = values in this interval not displayed.     Studies/Results: Portable Chest x-ray Result Date: 05/28/2023 CLINICAL DATA:  ET tube EXAM: PORTABLE CHEST 1 VIEW COMPARISON:  05/28/2023  FINDINGS: Endotracheal tube is 3.5 cm above the carina. NG tube is in the stomach. Right central line tip at the cavoatrial junction. Heart mediastinal contours within normal limits. Bibasilar atelectasis. No effusions or pneumothorax. No acute bony abnormality. IMPRESSION: Support devices in expected position as above. Bibasilar atelectasis. Electronically Signed   By: Charlett Nose M.D.   On: 05/28/2023 23:36   DG C-Arm 1-60 Min Result Date: 05/28/2023 CLINICAL DATA:  Cystoscopy and retrograde pyelography, intraoperative examination EXAM: DG C-ARM 1-60 MIN CONTRAST:  Not listed, see operative report FLUOROSCOPY: Fluoroscopy Time:  24.7 seconds Radiation Exposure Index (if provided by the fluoroscopic device): 5.45 mGy Number of Acquired Spot Images: 1 COMPARISON:  None Available. FINDINGS: Single fluoroscopic intraoperative radiograph demonstrates the superior aspect of the double-J ureteral stent extending into the expected upper pole calyx from the left kidney. No hydronephrosis. IMPRESSION: 1. Fluoroscopic guidance for left double-J ureteral stent placement. Electronically Signed   By: Helyn Numbers M.D.   On: 05/28/2023 22:30   CT CHEST ABDOMEN PELVIS WO CONTRAST Result Date: 05/28/2023 CLINICAL DATA:  Sepsis EXAM: CT CHEST, ABDOMEN AND PELVIS WITHOUT CONTRAST TECHNIQUE: Multidetector CT imaging of the chest, abdomen and pelvis was performed following the standard protocol without IV contrast. RADIATION DOSE REDUCTION: This exam was performed according to the departmental dose-optimization program which includes automated exposure control,  adjustment of the mA and/or kV according to patient size and/or use of iterative reconstruction technique. COMPARISON:  01/05/2023 FINDINGS: CT CHEST FINDINGS Cardiovascular: Unenhanced imaging of the heart is unremarkable without pericardial effusion. Normal caliber of the thoracic aorta. Evaluation of the vascular lumen is limited without IV contrast.  Mediastinum/Nodes: No enlarged mediastinal, hilar, or axillary lymph nodes. Thyroid gland, trachea, and esophagus demonstrate no significant findings. Lungs/Pleura: Dependent hypoventilatory changes within the lower lobes. No acute airspace disease, effusion, or pneumothorax. The central airways are patent. Musculoskeletal: No acute or destructive bony abnormalities. Reconstructed images demonstrate no additional findings. CT ABDOMEN PELVIS FINDINGS Hepatobiliary: Diffuse hepatic steatosis. Calcified gallstone and gallbladder sludge without evidence of acute cholecystitis. No biliary duct dilation. Pancreas: Unremarkable unenhanced appearance. Spleen: Unremarkable unenhanced appearance. Adrenals/Urinary Tract: There is an obstructing 7 mm left UVJ calculus, reference image 115/3. There is an additional nonobstructing 9 mm calculus within the distal left ureter reference image 103/3. Multiple nonobstructing left renal calculi are identified measuring up to 9 mm. There is moderate left-sided hydronephrosis and hydroureter. Left kidney is enlarged with diffuse renal edema and prominent perinephric fat stranding. Gas lucencies are identified within the upper pole calices, proximal left ureter, and bladder. If there has been no recent intervention, infection with gas-forming organism is suspected. Please correlate with urinalysis. Nonobstructing faint calcifications are seen within the mid and lower pole calices of the right kidney. No right-sided obstruction. The adrenals are unremarkable. Stomach/Bowel: No bowel obstruction or ileus. The appendix is surgically absent. Prior small bowel resection and reanastomosis. No bowel wall thickening or inflammatory change. Vascular/Lymphatic: Aortic atherosclerosis. No enlarged abdominal or pelvic lymph nodes. Reproductive: Uterus and bilateral adnexa are unremarkable. Other: Trace pelvic free fluid. No free intraperitoneal gas. No abdominal wall hernia. Musculoskeletal: No acute  or destructive bony abnormalities. Reconstructed images demonstrate no additional findings. IMPRESSION: 1. Obstructing 7 mm left UVJ calculus, with moderate left-sided obstructive uropathy as well as left renal edema and perinephric fat stranding. Gas within the bladder, left ureter, and left renal collecting system could reflect infection with gas-forming organism or sequela of recent intervention. Please correlate with procedural history and urinalysis. 2. Other nonobstructing calculi within the right kidney, left kidney, and left ureter as above. 3. Cholelithiasis and gallbladder sludge. No evidence of acute cholecystitis. 4. Hepatic steatosis. 5. Trace pelvic free fluid, likely reactive. 6.  Aortic Atherosclerosis (ICD10-I70.0). Electronically Signed   By: Sharlet Salina M.D.   On: 05/28/2023 19:58   CT Head Wo Contrast Result Date: 05/28/2023 CLINICAL DATA:  Altered level of consciousness, sepsis EXAM: CT HEAD WITHOUT CONTRAST TECHNIQUE: Contiguous axial images were obtained from the base of the skull through the vertex without intravenous contrast. RADIATION DOSE REDUCTION: This exam was performed according to the departmental dose-optimization program which includes automated exposure control, adjustment of the mA and/or kV according to patient size and/or use of iterative reconstruction technique. COMPARISON:  01/02/2023 FINDINGS: Brain: Imaging is limited due to patient motion throughout the study. No evidence of acute infarct or hemorrhage. Lateral ventricles and midline structures are unremarkable. No acute extra-axial fluid collections. No mass effect. Vascular: No hyperdense vessel or unexpected calcification. Skull: Normal. Negative for fracture or focal lesion. Sinuses/Orbits: Significant mucosal thickening within the left maxillary sinus unchanged. Stable opacification of the right sphenoid sinus. Other: None. IMPRESSION: 1. Limited study due to patient motion. 2. No acute intracranial process. 3.  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 Electronically Signed   By: Maxwell Caul.D.  On: 05/28/2023 19:51   DG Chest Port 1 View Result Date: 05/28/2023 CLINICAL DATA:  Questionable sepsis. EXAM: PORTABLE CHEST 1 VIEW COMPARISON:  01/28/2014 FINDINGS: Heart and mediastinal contours are within normal limits. No focal opacities or effusions. No acute bony abnormality. IMPRESSION: No active disease. Electronically Signed   By: Charlett Nose M.D.   On: 05/28/2023 18:43      LOS: 1 day   Elmon Kirschner, NP [facility] Pager: [phone]  05/29/2023, 1:55 PM

## 2023-05-29 NOTE — Progress Notes (Signed)
eLink Physician-Brief Progress Note Patient Name: Ilaina Bouler DOB: May 04, 1946 MRN: [id number]   Date of Service  05/29/2023  HPI/Events of Note  New admit and order request 69 F now in ICU post L UVJ stenting for uro sepsis. On ventilator continued post op. On insuline drip for presumed DKA. Not on pressors.AKI, AGMA. LA 6.3, from 6.5. making urine. A fib stable.  Camera: Discussed with RN. In synchrony with vent, 40%. Hemodynamically stable.   eICU Interventions  Continue current care plan, Foley ordered, on it from OT Follow lA, labs in AM Bicarb 50 meq once for partially compensated AGMA. On prophylaxis.      Intervention Category Major Interventions: Hyperglycemia - active titration of insulin therapy;Other:;Respiratory failure - evaluation and management Evaluation Type: New Patient Evaluation  Ranee Gosselin 05/29/2023, 12:45 AM

## 2023-05-29 NOTE — Anesthesia Postprocedure Evaluation (Signed)
Anesthesia Post Note  Patient: Deanna Shea  Procedure(s) Performed: CYSTOSCOPY WITH RETROGRADE PYELOGRAM/URETERAL STENT PLACEMENT (Left)     Patient location during evaluation: ICU Anesthesia Type: General Level of consciousness: sedated and patient remains intubated per anesthesia plan Pain management: pain level controlled Vital Signs Assessment: post-procedure vital signs reviewed and stable Respiratory status: spontaneous breathing and patient remains intubated per anesthesia plan Cardiovascular status: stable Anesthetic complications: no   No notable events documented.  Last Vitals:  Vitals:   05/29/23 0630 05/29/23 0645  BP:    Pulse: 90 89  Resp: (!) 25 (!) 25  Temp:    SpO2: 100% 100%    Last Pain:  Vitals:   05/29/23 0555  TempSrc: Axillary  PainSc:                  Lewie Loron

## 2023-05-29 NOTE — Progress Notes (Signed)
PHARMACY - PHYSICIAN COMMUNICATION CRITICAL VALUE ALERT - BLOOD CULTURE IDENTIFICATION (BCID)  Deanna Shea is an 78 y.o. female who presented to [facility] on 05/28/2023 with a chief complaint of urosepsis.  Assessment: BCx growing E.coli, no resistance  Name of physician (or Provider) Contacted: Dr. Merrily Pew  Current antibiotics: Rocephin  Changes to prescribed antibiotics recommended:  Patient is on recommended antibiotics - No changes needed  Results for orders placed or performed during the hospital encounter of 05/28/23  Blood Culture ID Panel (Reflexed) (Collected: 05/28/2023  6:17 PM)  Result Value Ref Range   Enterococcus faecalis NOT DETECTED NOT DETECTED   Enterococcus Faecium NOT DETECTED NOT DETECTED   Listeria monocytogenes NOT DETECTED NOT DETECTED   Staphylococcus species NOT DETECTED NOT DETECTED   Staphylococcus aureus (BCID) NOT DETECTED NOT DETECTED   Staphylococcus epidermidis NOT DETECTED NOT DETECTED   Staphylococcus lugdunensis NOT DETECTED NOT DETECTED   Streptococcus species NOT DETECTED NOT DETECTED   Streptococcus agalactiae NOT DETECTED NOT DETECTED   Streptococcus pneumoniae NOT DETECTED NOT DETECTED   Streptococcus pyogenes NOT DETECTED NOT DETECTED   A.calcoaceticus-baumannii NOT DETECTED NOT DETECTED   Bacteroides fragilis NOT DETECTED NOT DETECTED   Enterobacterales DETECTED (A) NOT DETECTED   Enterobacter cloacae complex NOT DETECTED NOT DETECTED   Escherichia coli DETECTED (A) NOT DETECTED   Klebsiella aerogenes NOT DETECTED NOT DETECTED   Klebsiella oxytoca NOT DETECTED NOT DETECTED   Klebsiella pneumoniae NOT DETECTED NOT DETECTED   Proteus species NOT DETECTED NOT DETECTED   Salmonella species NOT DETECTED NOT DETECTED   Serratia marcescens NOT DETECTED NOT DETECTED   Haemophilus influenzae NOT DETECTED NOT DETECTED   Neisseria meningitidis NOT DETECTED NOT DETECTED   Pseudomonas aeruginosa NOT DETECTED NOT DETECTED   Stenotrophomonas  maltophilia NOT DETECTED NOT DETECTED   Candida albicans NOT DETECTED NOT DETECTED   Candida auris NOT DETECTED NOT DETECTED   Candida glabrata NOT DETECTED NOT DETECTED   Candida krusei NOT DETECTED NOT DETECTED   Candida parapsilosis NOT DETECTED NOT DETECTED   Candida tropicalis NOT DETECTED NOT DETECTED   Cryptococcus neoformans/gattii NOT DETECTED NOT DETECTED   CTX-M ESBL NOT DETECTED NOT DETECTED   Carbapenem resistance IMP NOT DETECTED NOT DETECTED   Carbapenem resistance KPC NOT DETECTED NOT DETECTED   Carbapenem resistance NDM NOT DETECTED NOT DETECTED   Carbapenem resist OXA 48 LIKE NOT DETECTED NOT DETECTED   Carbapenem resistance VIM NOT DETECTED NOT DETECTED   Raymone Pembroke D. Laney Potash, PharmD, BCPS, BCCCP 05/29/2023, 8:56 AM

## 2023-05-29 NOTE — Plan of Care (Signed)
Posey belt initiated today for patient safety

## 2023-05-29 NOTE — Plan of Care (Signed)
  Problem: Clinical Measurements: Goal: Respiratory complications will improve Outcome: Progressing   Problem: Coping: Goal: Level of anxiety will decrease Outcome: Progressing   Problem: Elimination: Goal: Will not experience complications related to bowel motility Outcome: Progressing   Problem: Elimination: Goal: Will not experience complications related to urinary retention Outcome: Progressing

## 2023-05-30 ENCOUNTER — Other Ambulatory Visit: Payer: Self-pay

## 2023-05-30 DIAGNOSIS — A4151 Sepsis due to Escherichia coli [E. coli]: Secondary | ICD-10-CM | POA: Diagnosis not present

## 2023-05-30 DIAGNOSIS — R6521 Severe sepsis with septic shock: Secondary | ICD-10-CM | POA: Diagnosis not present

## 2023-05-30 DIAGNOSIS — N179 Acute kidney failure, unspecified: Secondary | ICD-10-CM | POA: Diagnosis not present

## 2023-05-30 DIAGNOSIS — N390 Urinary tract infection, site not specified: Secondary | ICD-10-CM | POA: Diagnosis not present

## 2023-05-30 LAB — CBC
HCT: 34.2 % — ABNORMAL LOW (ref 36.0–46.0)
Hemoglobin: 11.8 g/dL — ABNORMAL LOW (ref 12.0–15.0)
MCH: 30.2 pg (ref 26.0–34.0)
MCHC: 34.5 g/dL (ref 30.0–36.0)
MCV: 87.5 fL (ref 80.0–100.0)
Platelets: 55 10*3/uL — ABNORMAL LOW (ref 150–400)
RBC: 3.91 MIL/uL (ref 3.87–5.11)
RDW: 13.2 % (ref 11.5–15.5)
WBC: 7.4 10*3/uL (ref 4.0–10.5)
nRBC: 0 % (ref 0.0–0.2)

## 2023-05-30 LAB — BASIC METABOLIC PANEL
Anion gap: 5 (ref 5–15)
BUN: 22 mg/dL (ref 8–23)
CO2: 24 mmol/L (ref 22–32)
Calcium: 7.9 mg/dL — ABNORMAL LOW (ref 8.9–10.3)
Chloride: 107 mmol/L (ref 98–111)
Creatinine, Ser: 1.44 mg/dL — ABNORMAL HIGH (ref 0.44–1.00)
GFR, Estimated: 38 mL/min — ABNORMAL LOW (ref >60)
Glucose, Bld: 140 mg/dL — ABNORMAL HIGH (ref 70–99)
Potassium: 3.5 mmol/L (ref 3.5–5.1)
Sodium: 136 mmol/L (ref 135–145)

## 2023-05-30 LAB — URINE CULTURE: Culture: >=100000 — AB

## 2023-05-30 LAB — GLUCOSE, CAPILLARY
Glucose-Capillary: 175 mg/dL — ABNORMAL HIGH (ref 70–99)
Glucose-Capillary: 131 mg/dL — ABNORMAL HIGH (ref 70–99)
Glucose-Capillary: 129 mg/dL — ABNORMAL HIGH (ref 70–99)
Glucose-Capillary: 128 mg/dL — ABNORMAL HIGH (ref 70–99)
Glucose-Capillary: 133 mg/dL — ABNORMAL HIGH (ref 70–99)
Glucose-Capillary: 153 mg/dL — ABNORMAL HIGH (ref 70–99)
Glucose-Capillary: 146 mg/dL — ABNORMAL HIGH (ref 70–99)
Glucose-Capillary: 136 mg/dL — ABNORMAL HIGH (ref 70–99)
Glucose-Capillary: 119 mg/dL — ABNORMAL HIGH (ref 70–99)
Glucose-Capillary: 126 mg/dL — ABNORMAL HIGH (ref 70–99)
Glucose-Capillary: 161 mg/dL — ABNORMAL HIGH (ref 70–99)
Glucose-Capillary: 192 mg/dL — ABNORMAL HIGH (ref 70–99)

## 2023-05-30 LAB — CG4 I-STAT (LACTIC ACID): Lactic Acid, Venous: 2.6 mmol/L (ref 0.5–1.9)

## 2023-05-30 MED ORDER — ESCITALOPRAM OXALATE 10 MG PO TABS
20.0000 mg | ORAL_TABLET | Freq: Every day | ORAL | Status: DC
  Administered 2023-05-31 – 2023-06-11 (×12): 20 mg via ORAL
  Filled 2023-05-30 (×12): qty 2

## 2023-05-30 MED ORDER — ORAL CARE MOUTH RINSE: 15.0000 mL | OROMUCOSAL | Status: DC | PRN

## 2023-05-30 MED ORDER — METOPROLOL TARTRATE 25 MG PO TABS
25.0000 mg | ORAL_TABLET | Freq: Two times a day (BID) | ORAL | Status: DC
  Administered 2023-05-31 – 2023-06-01 (×4): 25 mg via ORAL
  Filled 2023-05-30 (×4): qty 1

## 2023-05-30 MED ORDER — CALCIUM GLUCONATE-NACL 2-0.675 GM/100ML-% IV SOLN
2.0000 g | Freq: Once | INTRAVENOUS | Status: AC
  Administered 2023-05-30: 2000 mg via INTRAVENOUS
  Filled 2023-05-30: qty 100

## 2023-05-30 MED ORDER — POTASSIUM CHLORIDE 20 MEQ PO PACK
40.0000 meq | PACK | Freq: Once | ORAL | Status: AC
  Administered 2023-05-30: 40 meq
  Filled 2023-05-30: qty 2

## 2023-05-30 MED ORDER — DEXMEDETOMIDINE HCL IN NACL 400 MCG/100ML IV SOLN
0.0000 ug/kg/h | INTRAVENOUS | Status: AC
  Administered 2023-05-30: 0.4 ug/kg/h via INTRAVENOUS
  Filled 2023-05-30: qty 100

## 2023-05-30 MED ORDER — RISPERIDONE 3 MG PO TABS
3.0000 mg | ORAL_TABLET | Freq: Every day | ORAL | Status: DC
  Administered 2023-05-30 – 2023-06-10 (×12): 3 mg via ORAL
  Filled 2023-05-30 (×13): qty 1

## 2023-05-30 MED ORDER — SODIUM CHLORIDE 0.9 % IV SOLN
1.0000 g | Freq: Once | INTRAVENOUS | Status: AC
  Administered 2023-05-30: 1 g via INTRAVENOUS
  Filled 2023-05-30: qty 10

## 2023-05-30 MED ORDER — LACTATED RINGERS IV BOLUS
1000.0000 mL | Freq: Once | INTRAVENOUS | Status: AC
  Administered 2023-05-30: 1000 mL via INTRAVENOUS

## 2023-05-30 MED ORDER — DOCUSATE SODIUM 100 MG PO CAPS: 100.0000 mg | ORAL_CAPSULE | Freq: Two times a day (BID) | ORAL | Status: DC

## 2023-05-30 MED ORDER — SODIUM CHLORIDE 0.9 % IV SOLN
2.0000 g | INTRAVENOUS | Status: DC
  Administered 2023-05-31 – 2023-06-01 (×2): 2 g via INTRAVENOUS
  Filled 2023-05-30 (×2): qty 20

## 2023-05-30 MED ORDER — INSULIN ASPART 100 UNIT/ML IJ SOLN
0.0000 [IU] | INTRAMUSCULAR | Status: DC
  Administered 2023-05-30 (×2): 2 [IU] via SUBCUTANEOUS
  Administered 2023-05-30: 1 [IU] via SUBCUTANEOUS
  Administered 2023-05-31 (×2): 3 [IU] via SUBCUTANEOUS
  Administered 2023-05-31: 2 [IU] via SUBCUTANEOUS

## 2023-05-30 MED ORDER — POLYETHYLENE GLYCOL 3350 17 G PO PACK: 17.0000 g | PACK | Freq: Every day | ORAL | Status: DC

## 2023-05-30 MED ORDER — ROSUVASTATIN CALCIUM 5 MG PO TABS
2.5000 mg | ORAL_TABLET | Freq: Every day | ORAL | Status: DC
  Administered 2023-05-31 – 2023-06-11 (×12): 2.5 mg via ORAL
  Filled 2023-05-30 (×5): qty 1
  Filled 2023-05-30: qty 0.5
  Filled 2023-05-30 (×5): qty 1
  Filled 2023-05-30: qty 0.5

## 2023-05-30 MED ORDER — INSULIN GLARGINE-YFGN 100 UNIT/ML ~~LOC~~ SOLN
15.0000 [IU] | Freq: Every day | SUBCUTANEOUS | Status: DC
  Administered 2023-05-30: 15 [IU] via SUBCUTANEOUS
  Filled 2023-05-30 (×2): qty 0.15

## 2023-05-30 MED ORDER — LEVOTHYROXINE SODIUM 88 MCG PO TABS
88.0000 ug | ORAL_TABLET | Freq: Every day | ORAL | Status: DC
  Administered 2023-05-31 – 2023-06-11 (×12): 88 ug via ORAL
  Filled 2023-05-30 (×12): qty 1

## 2023-05-30 NOTE — Inpatient Diabetes Management (Addendum)
Inpatient Diabetes Program Recommendations  AACE/ADA: New Consensus Statement on Inpatient Glycemic Control (2015)  Target Ranges:  Prepandial:   less than 140 mg/dL      Peak postprandial:   less than 180 mg/dL (1-2 hours)      Critically ill patients:  140 - 180 mg/dL   Lab Results  Component Value Date   GLUCAP 153 (H) 05/30/2023   HGBA1C 7.0 (H) 01/03/2023    Review of Glycemic Control  Diabetes history: DM 2 Outpatient Diabetes medications: Metformin 500 mg bid Current orders for Inpatient glycemic control:  IV insulin  IV insulin gtt rate between 3-4 units/hour (will need to titrate down based on home regimen as pt is extubated only on metformin at home) Elevated renal function No Tube Feeds yet  Inpatient Diabetes Program Recommendations:    -   Semglee 15 units -   Novolog 0-9 units Q4 hours  Thanks,  Christena Deem RN, MSN, BC-ADM Inpatient Diabetes Coordinator Team Pager [phone] (8a-5p)

## 2023-05-30 NOTE — Progress Notes (Deleted)
Time of death 49. This nurse and Wynona Luna, RN pronounced. No respirations noted. Morphine drip wasted with Brianna in medicine room. Jerel Shepherd, RN notified.

## 2023-05-30 NOTE — Progress Notes (Signed)
2 Days Post-Op Subjective: Intubated and sedated, on pressors due to labile Bps, still spiking fevers.   Objective: Vital signs in last 24 hours: Temp:  [98.2 F (36.8 C)-103 F (39.4 C)] 99.5 F (37.5 C) (01/22 0411) Pulse Rate:  [80-108] 91 (01/22 0645) Resp:  [0-29] 18 (01/22 0645) SpO2:  [93 %-100 %] 98 % (01/22 0645) Arterial Line BP: (82-206)/(42-68) 122/50 (01/22 0645) FiO2 (%):  [40 %] 40 % (01/22 0419) Weight:  [77.5 kg] 77.5 kg (01/22 0442)  Intake/Output from previous day: 01/21 0701 - 01/22 0700 In: 5069 [I.V.:3745.3; IV Piggyback:1323.7] Out: 2075 [Urine:2075] Intake/Output this shift: Total I/O In: 1827.2 [I.V.:1727.1; IV Piggyback:100.1] Out: 1075 [Urine:1075]  Physical Exam:  General: Intubated and sedated  CV: RRR Lungs: Clear Abdomen: Soft, ND, GU: catheter in place, draining clear yellow urine   Lab Results: Recent Labs    05/29/23 0429 05/29/23 0859 05/30/23 0429  HGB 12.9 12.6 11.8*  HCT 37.3 37.0 34.2*   BMET Recent Labs    05/29/23 0429 05/29/23 0859 05/30/23 0429  NA 130* 135 136  K 4.0 3.5 3.5  CL 101  --  107  CO2 18*  --  24  GLUCOSE 274*  --  140*  BUN 30*  --  22  CREATININE 1.76*  --  1.44*  CALCIUM 7.7*  --  7.9*     Studies/Results: Portable Chest x-ray Result Date: 05/28/2023 CLINICAL DATA:  ET tube EXAM: PORTABLE CHEST 1 VIEW COMPARISON:  05/28/2023 FINDINGS: Endotracheal tube is 3.5 cm above the carina. NG tube is in the stomach. Right central line tip at the cavoatrial junction. Heart mediastinal contours within normal limits. Bibasilar atelectasis. No effusions or pneumothorax. No acute bony abnormality. IMPRESSION: Support devices in expected position as above. Bibasilar atelectasis. Electronically Signed   By: Charlett Nose M.D.   On: 05/28/2023 23:36   DG C-Arm 1-60 Min Result Date: 05/28/2023 CLINICAL DATA:  Cystoscopy and retrograde pyelography, intraoperative examination EXAM: DG C-ARM 1-60 MIN CONTRAST:  Not  listed, see operative report FLUOROSCOPY: Fluoroscopy Time:  24.7 seconds Radiation Exposure Index (if provided by the fluoroscopic device): 5.45 mGy Number of Acquired Spot Images: 1 COMPARISON:  None Available. FINDINGS: Single fluoroscopic intraoperative radiograph demonstrates the superior aspect of the double-J ureteral stent extending into the expected upper pole calyx from the left kidney. No hydronephrosis. IMPRESSION: 1. Fluoroscopic guidance for left double-J ureteral stent placement. Electronically Signed   By: Helyn Numbers M.D.   On: 05/28/2023 22:30   CT CHEST ABDOMEN PELVIS WO CONTRAST Result Date: 05/28/2023 CLINICAL DATA:  Sepsis EXAM: CT CHEST, ABDOMEN AND PELVIS WITHOUT CONTRAST TECHNIQUE: Multidetector CT imaging of the chest, abdomen and pelvis was performed following the standard protocol without IV contrast. RADIATION DOSE REDUCTION: This exam was performed according to the departmental dose-optimization program which includes automated exposure control, adjustment of the mA and/or kV according to patient size and/or use of iterative reconstruction technique. COMPARISON:  01/05/2023 FINDINGS: CT CHEST FINDINGS Cardiovascular: Unenhanced imaging of the heart is unremarkable without pericardial effusion. Normal caliber of the thoracic aorta. Evaluation of the vascular lumen is limited without IV contrast. Mediastinum/Nodes: No enlarged mediastinal, hilar, or axillary lymph nodes. Thyroid gland, trachea, and esophagus demonstrate no significant findings. Lungs/Pleura: Dependent hypoventilatory changes within the lower lobes. No acute airspace disease, effusion, or pneumothorax. The central airways are patent. Musculoskeletal: No acute or destructive bony abnormalities. Reconstructed images demonstrate no additional findings. CT ABDOMEN PELVIS FINDINGS Hepatobiliary: Diffuse hepatic steatosis. Calcified gallstone and  gallbladder sludge without evidence of acute cholecystitis. No biliary duct  dilation. Pancreas: Unremarkable unenhanced appearance. Spleen: Unremarkable unenhanced appearance. Adrenals/Urinary Tract: There is an obstructing 7 mm left UVJ calculus, reference image 115/3. There is an additional nonobstructing 9 mm calculus within the distal left ureter reference image 103/3. Multiple nonobstructing left renal calculi are identified measuring up to 9 mm. There is moderate left-sided hydronephrosis and hydroureter. Left kidney is enlarged with diffuse renal edema and prominent perinephric fat stranding. Gas lucencies are identified within the upper pole calices, proximal left ureter, and bladder. If there has been no recent intervention, infection with gas-forming organism is suspected. Please correlate with urinalysis. Nonobstructing faint calcifications are seen within the mid and lower pole calices of the right kidney. No right-sided obstruction. The adrenals are unremarkable. Stomach/Bowel: No bowel obstruction or ileus. The appendix is surgically absent. Prior small bowel resection and reanastomosis. No bowel wall thickening or inflammatory change. Vascular/Lymphatic: Aortic atherosclerosis. No enlarged abdominal or pelvic lymph nodes. Reproductive: Uterus and bilateral adnexa are unremarkable. Other: Trace pelvic free fluid. No free intraperitoneal gas. No abdominal wall hernia. Musculoskeletal: No acute or destructive bony abnormalities. Reconstructed images demonstrate no additional findings. IMPRESSION: 1. Obstructing 7 mm left UVJ calculus, with moderate left-sided obstructive uropathy as well as left renal edema and perinephric fat stranding. Gas within the bladder, left ureter, and left renal collecting system could reflect infection with gas-forming organism or sequela of recent intervention. Please correlate with procedural history and urinalysis. 2. Other nonobstructing calculi within the right kidney, left kidney, and left ureter as above. 3. Cholelithiasis and gallbladder  sludge. No evidence of acute cholecystitis. 4. Hepatic steatosis. 5. Trace pelvic free fluid, likely reactive. 6.  Aortic Atherosclerosis (ICD10-I70.0). Electronically Signed   By: Sharlet Salina M.D.   On: 05/28/2023 19:58   CT Head Wo Contrast Result Date: 05/28/2023 CLINICAL DATA:  Altered level of consciousness, sepsis EXAM: CT HEAD WITHOUT CONTRAST TECHNIQUE: Contiguous axial images were obtained from the base of the skull through the vertex without intravenous contrast. RADIATION DOSE REDUCTION: This exam was performed according to the departmental dose-optimization program which includes automated exposure control, adjustment of the mA and/or kV according to patient size and/or use of iterative reconstruction technique. COMPARISON:  01/02/2023 FINDINGS: Brain: Imaging is limited due to patient motion throughout the study. No evidence of acute infarct or hemorrhage. Lateral ventricles and midline structures are unremarkable. No acute extra-axial fluid collections. No mass effect. Vascular: No hyperdense vessel or unexpected calcification. Skull: Normal. Negative for fracture or focal lesion. Sinuses/Orbits: Significant mucosal thickening within the left maxillary sinus unchanged. Stable opacification of the right sphenoid sinus. Other: None. IMPRESSION: 1. Limited study due to patient motion. 2. No acute intracranial process. 3. 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 Electronically Signed   By: Sharlet Salina M.D.   On: 05/28/2023 19:51   DG Chest Port 1 View Result Date: 05/28/2023 CLINICAL DATA:  Questionable sepsis. EXAM: PORTABLE CHEST 1 VIEW COMPARISON:  01/28/2014 FINDINGS: Heart and mediastinal contours are within normal limits. No focal opacities or effusions. No acute bony abnormality. IMPRESSION: No active disease. Electronically Signed   By: Charlett Nose M.D.   On: 05/28/2023 18:43    Assessment/Plan: #sepsis #emphysematous pyelitis  #left ureteral stone   To  the OR with on 05/28/23 for urgent ureteral stent placement.  BP, requiring Levo. T-max 103 last night. Ok to remove foley from Urologic perspective once pt has been normothermic for 24 hrs  and discharged from ICU. Definitive stone mgmt on an outpt basis.  Blood Cx growing enterobacter and E. Coli sensitivities pending, on ceftriaxone  IF Continues to spike fevers consider CT to evaluate for developing abscess AKI improving Cr now 1.4    LOS: 2 days   Sherle Poe MD 05/30/2023, 7:00 AM [facility]

## 2023-05-30 NOTE — Plan of Care (Signed)
  Problem: Clinical Measurements: Goal: Diagnostic test results will improve 05/30/2023 1709 by Conley Simmonds, RN Outcome: Progressing 05/30/2023 1708 by Conley Simmonds, RN Outcome: Progressing Goal: Respiratory complications will improve 05/30/2023 1709 by Conley Simmonds, RN Outcome: Progressing 05/30/2023 1708 by Conley Simmonds, RN Outcome: Progressing Goal: Cardiovascular complication will be avoided 05/30/2023 1709 by Conley Simmonds, RN Outcome: Progressing 05/30/2023 1708 by Conley Simmonds, RN Outcome: Progressing   Problem: Activity: Goal: Risk for activity intolerance will decrease 05/30/2023 1709 by Conley Simmonds, RN Outcome: Progressing 05/30/2023 1708 by Conley Simmonds, RN Outcome: Progressing   Problem: Nutrition: Goal: Adequate nutrition will be maintained 05/30/2023 1709 by Conley Simmonds, RN Outcome: Progressing 05/30/2023 1708 by Conley Simmonds, RN Outcome: Progressing   Problem: Coping: Goal: Level of anxiety will decrease 05/30/2023 1709 by Conley Simmonds, RN Outcome: Progressing 05/30/2023 1708 by Conley Simmonds, RN Outcome: Progressing   Problem: Elimination: Goal: Will not experience complications related to bowel motility 05/30/2023 1709 by Conley Simmonds, RN Outcome: Progressing 05/30/2023 1708 by Conley Simmonds, RN Outcome: Progressing Goal: Will not experience complications related to urinary retention 05/30/2023 1709 by Conley Simmonds, RN Outcome: Progressing 05/30/2023 1708 by Conley Simmonds, RN Outcome: Progressing   Problem: Pain Managment: Goal: General experience of comfort will improve and/or be controlled 05/30/2023 1709 by Conley Simmonds, RN Outcome: Progressing 05/30/2023 1708 by Conley Simmonds, RN Outcome: Progressing   Problem: Safety: Goal: Ability to remain free from injury will improve 05/30/2023 1709 by Conley Simmonds, RN Outcome: Progressing 05/30/2023 1708  by Conley Simmonds, RN Outcome: Progressing   Problem: Skin Integrity: Goal: Risk for impaired skin integrity will decrease 05/30/2023 1709 by Conley Simmonds, RN Outcome: Progressing 05/30/2023 1708 by Conley Simmonds, RN Outcome: Progressing   Problem: Activity: Goal: Ability to tolerate increased activity will improve 05/30/2023 1709 by Conley Simmonds, RN Outcome: Progressing 05/30/2023 1708 by Conley Simmonds, RN Outcome: Progressing   Problem: Respiratory: Goal: Ability to maintain a clear airway and adequate ventilation will improve 05/30/2023 1709 by Conley Simmonds, RN Outcome: Progressing 05/30/2023 1708 by Conley Simmonds, RN Outcome: Progressing   Problem: Role Relationship: Goal: Method of communication will improve 05/30/2023 1709 by Conley Simmonds, RN Outcome: Progressing 05/30/2023 1708 by Conley Simmonds, RN Outcome: Progressing

## 2023-05-30 NOTE — Procedures (Signed)
Extubation Procedure Note  Patient Details:   Name: Deanna Shea DOB: [birth year] MRN: [id number]   Airway Documentation:    Vent end date: 05/30/23 Vent end time: 0925   Evaluation  O2 sats: stable throughout Complications: No apparent complications Patient did tolerate procedure well. Bilateral Breath Sounds: Clear, Diminished   Yes  Pt extubated per MD order, placed on 3L . Positive cuff leak, no stridor heard. RT will continue to monitor.   Vicente Masson 05/30/2023, 9:28 AM

## 2023-05-30 NOTE — Plan of Care (Signed)
  Problem: Clinical Measurements: Goal: Diagnostic test results will improve Outcome: Progressing   Problem: Clinical Measurements: Goal: Respiratory complications will improve Outcome: Progressing   Problem: Elimination: Goal: Will not experience complications related to bowel motility Outcome: Progressing   Problem: Pain Managment: Goal: General experience of comfort will improve and/or be controlled Outcome: Progressing

## 2023-05-30 NOTE — Progress Notes (Signed)
NAME:  Deanna Shea, MRN:  [id number], DOB:  November 02, 1945, LOS: 2 ADMISSION DATE:  05/28/2023 CONSULTATION DATE:  05/28/2023 REFERRING MD:  Durwin Nora - EDP CHIEF COMPLAINT:  Urosepsis   History of Present Illness:  78 year old woman who presented to [facility] ED 1/20 for AMS and lethargy. PMHx significant for Afib (on ASA/digoxin/propranolol), mitral valve prolapse, HLD, T2DM, nephrolithiasis, hypothyroidism, anxiety/depression, schizophrenia.  Patient initially presented to [facility] ED from her assisted living facility for AMS and lethargy x 1 day; she was reportedly confused to place/situation and hyperglycemic to 500s. At baseline, alert and oriented. On ED arrival, patient was afebrile, HR 99 (on BB), BP 108/50, RR 30, SpO2 97%. Labs were notable for WBC 6.7, Hgb 15.4 (baseline 12-15), Plt 121 (baseline 245-300). INR 1.4. Na 126, K 5.1, CO2 14, Cr 2.48 (baseline 0.6-1), glucose 501, Mg 1.4. CK 150. TSH 4.1. Dig level WNL. Trop 34 (likely demand-related), BNP 89. B-hydroxybutyrate 1.19. LA 5.5 > 6.5. UA with > 500 glucose, moderate Hgb, 5 ketones, 100 protein, moderate leuks. COVID/Flu/RSV negative and BCx sent. Empiric broad-spectrum antibiotics were started for presumed sepsis. CT Head NAICA, CT Chest/A/P demonstrated obstructing  7mm UVJ calculus with moderate L-sided obstructive uropathy, L renal edema and perinephric stranding; gas was noted in the bladder, L ureter and L renal collecting system. Urology was consulted with plan for L ureteral stent placement.  PCCM consulted for ICU admission postoperatively.  Pertinent Medical History:   Past Medical History:  Diagnosis Date   Allergic rhinitis    Anxiety    Atrial fibrillation (HCC) 10/28/2013   diagnoses in the 20's, well controlled with Digoxin and Inderal    Depression    Diabetes mellitus without complication (HCC)    Gall stones    Heart murmur    Kidney disease, medullary sponge    Kidney stones    Mitral valve disorders(424.0) 10/28/2013    Mitral valve prolapse    congenital    MRSA (methicillin resistant Staphylococcus aureus)    9/10   Osteopenia    dexa 4/09, improved but not resolved 2011 DEXA   Palpitations 10/28/2013   Schizophrenia (HCC)    Shingles    2000   Significant Hospital Events: Including procedures, antibiotic start and stop dates in addition to other pertinent events   1/20 - Presented to [facility] ED for AMS/lethargy. Found to have ?DKA, urosepsis. Elevated LA. CT A/P showed obstructing L UVJ calculus with obstructive uropathy. Uro consulted. Taken to OR for L ureteral stent. PCCM consulted for ICU admission.  Interim History / Subjective:  Patient spiked fever with Tmax 103 overnight Her current temperature 100.4 Her vasopressor requirement has improved, currently on 3 mics of Levophed Still on insulin infusion which will be transition to sliding scale and Semglee  Objective:  Blood pressure (!) 128/56, pulse 95, temperature (!) 100.4 F (38 C), temperature source Oral, resp. rate 14, height 5\' 7"  (1.702 m), weight 77.5 kg, SpO2 99%.    Vent Mode: PSV;CPAP FiO2 (%):  [40 %] 40 % Set Rate:  [18 bmp] 18 bmp Vt Set:  [490 mL] 490 mL PEEP:  [5 cmH20] 5 cmH20 Pressure Support:  [5 cmH20] 5 cmH20 Plateau Pressure:  [8 cmH20] 8 cmH20   Intake/Output Summary (Last 24 hours) at 05/30/2023 0857 Last data filed at 05/30/2023 0700 Gross per 24 hour  Intake 4718.91 ml  Output 1825 ml  Net 2893.91 ml   Filed Weights   05/29/23 0122 05/29/23 2100 05/30/23 0442  Weight: 79.2 kg  77.5 kg 77.5 kg   Physical Examination: General: Crtitically ill-appearing elderly female, orally intubated HEENT: [place]/AT, eyes anicteric.  ETT and OGT in place Neuro: Lethargic, opens eyes with vocal stimuli, following simple commands Chest: Coarse breath sounds, no wheezes or rhonchi Heart: Regular rate and rhythm, no murmurs or gallops Abdomen: Soft, nondistended, bowel sounds present Skin: No rash   Labs and images  reviewed  Resolved Hospital Problem List:  Hyperkalemia Hyponatremia  Assessment & Plan:  Severe sepsis with septic shock due to complicated E. coli urinary tract infection with obstructive uropathy, POA Obstructive uropathy due to obstructing L UVJ calculus status post stent E. coli bacteremia Lactic acidosis Patient continued to spike fever, overnight her Tmax was 103 Continue IV ceftriaxone, increase to 2 g daily Her urine and blood cultures are growing E. coli which is pansensitive If she continued to spike fever in next 24-hour, will obtain CT abdomen to rule out renal abscess Appreciate urology follow-up Repeat lactate now  Acute respiratory insufficiency, postprocedure Continue lung protective ventilation Patient is tolerating spontaneous breathing trial, will try to extubate her VAP prevention bundle in place She is off sedation  Diabetic ketoacidosis Transition insulin infusion to Semglee and sliding scale insulin  Hold metformin  Continue IV fluid   Acute metabolic/septic encephalopathy Patient presented with altered mental status, she is here with septic shock also she has DKA Off sedation, she is just lethargic but follows commands  AKI in the setting of obstructive uropathy and septic ATN AGMA Hyperkalemia, resolved Hypomagnesemia Hyponatremia/hypocalcemia Serum creatinine continue to trend down, now it is 1.4 Will give 1 L IV fluid bolus Continue aggressive electrolyte replacement and monitor Avoid nephrotoxic agent  Paroxysmal A-fib Patient is in sinus rhythm Continue aspirin for stroke prophylaxis  Hypothyroidism Continue home Synthroid  Depression Anxiety Schizophrenia Continue risperidone, Lexapro as clinically appropriate  Thrombocytopenia due to critical illness Patient's platelet count dropped to 55, I do not think it is related to HIT, due to septic shock Watch for signs of bleeding  Best Practice: (right click and "Reselect all  SmartList Selections" daily)   Diet/type: NPO DVT prophylaxis: Heparin subcu GI prophylaxis: N/A Lines: Yes, still needed Foley:  Yes, and it is still needed Code Status:  full code Last date of multidisciplinary goals of care discussion: 1/22: Patient's son was updated over the phone, decision was to continue full scope of care  Labs:  CBC: Recent Labs  Lab 05/28/23 1829 05/28/23 1836 05/28/23 2346 05/29/23 0019 05/29/23 0429 05/29/23 0859 05/30/23 0429  WBC 6.7  --  4.8  --  8.1  --  7.4  NEUTROABS 5.4  --   --   --   --   --   --   HGB 15.4*   < > 12.8 12.9 12.9 12.6 11.8*  HCT 46.7*   < > 39.1 38.0 37.3 37.0 34.2*  MCV 88.8  --  89.5  --  86.3  --  87.5  PLT 121*  --  80*  --  90*  --  55*   < > = values in this interval not displayed.   Basic Metabolic Panel: Recent Labs  Lab 05/28/23 1829 05/28/23 1836 05/28/23 2208 05/28/23 2217 05/28/23 2346 05/29/23 0019 05/29/23 0429 05/29/23 0859 05/30/23 0429  NA 126* 129*   < > 132* 128* 131* 130* 135 136  K 5.1 4.9   < > 4.0 3.8 3.8 4.0 3.5 3.5  CL 92* 95*  --  97* 99  --  101  --  107  CO2 14*  --   --   --  14*  --  18*  --  24  GLUCOSE 501* 495*  --  429* 414*  --  274*  --  140*  BUN 32* 32*  --  28* 30*  --  30*  --  22  CREATININE 2.48* 2.30*  --  1.80* 1.83*  --  1.76*  --  1.44*  CALCIUM 8.5*  --   --   --  7.6*  --  7.7*  --  7.9*  MG 1.4*  --   --   --   --   --  1.8  --   --   PHOS  --   --   --   --   --   --  2.6  --   --    < > = values in this interval not displayed.   GFR: Estimated Creatinine Clearance: 36.2 mL/min (A) (by C-G formula based on SCr of 1.44 mg/dL (H)). Recent Labs  Lab 05/28/23 1829 05/28/23 1837 05/28/23 2054 05/28/23 2346 05/29/23 0429 05/29/23 0435 05/30/23 0429  WBC 6.7  --   --  4.8 8.1  --  7.4  LATICACIDVEN  --  5.5* 6.5* 6.3*  --  4.6*  --    Liver Function Tests: Recent Labs  Lab 05/28/23 1829  AST 49*  ALT 40  ALKPHOS 57  BILITOT 1.5*  PROT 6.5  ALBUMIN  3.3*   No results for input(s): "LIPASE", "AMYLASE" in the last 168 hours. Recent Labs  Lab 05/28/23 1820  AMMONIA 19   ABG:    Component Value Date/Time   PHART 7.384 05/29/2023 0859   PCO2ART 34.5 05/29/2023 0859   PO2ART 124 (H) 05/29/2023 0859   HCO3 20.5 05/29/2023 0859   TCO2 21 (L) 05/29/2023 0859   ACIDBASEDEF 4.0 (H) 05/29/2023 0859   O2SAT 99 05/29/2023 0859    Coagulation Profile: Recent Labs  Lab 05/28/23 1829  INR 1.4*   Cardiac Enzymes: Recent Labs  Lab 05/28/23 1829  CKTOTAL 150   HbA1C: Hgb A1c MFr Bld  Date/Time Value Ref Range Status  01/03/2023 02:23 PM 7.0 (H) 4.8 - 5.6 % Final    Comment:    (NOTE) Pre diabetes:          5.7%-6.4%  Diabetes:              >6.4%  Glycemic control for   <7.0% adults with diabetes    CBG: Recent Labs  Lab 05/30/23 0348 05/30/23 0437 05/30/23 0532 05/30/23 0628 05/30/23 0733  GLUCAP 131* 129* 133* 128* 153*    The patient is critically ill due to severe sepsis with septic shock.  Critical care was necessary to treat or prevent imminent or life-threatening deterioration.  Critical care was time spent personally by me on the following activities: development of treatment plan with patient and/or surrogate as well as nursing, discussions with consultants, evaluation of patient's response to treatment, examination of patient, obtaining history from patient or surrogate, ordering and performing treatments and interventions, ordering and review of laboratory studies, ordering and review of radiographic studies, pulse oximetry, re-evaluation of patient's condition and participation in multidisciplinary rounds.   During this encounter critical care time was devoted to patient care services described in this note for 39 minutes.     Cheri Fowler, MD [facility] See Amion for pager If no response to pager, please call [phone]  until 7pm After 7pm, Please call E-link [phone]

## 2023-05-31 DIAGNOSIS — A4151 Sepsis due to Escherichia coli [E. coli]: Secondary | ICD-10-CM

## 2023-05-31 DIAGNOSIS — N179 Acute kidney failure, unspecified: Secondary | ICD-10-CM | POA: Diagnosis not present

## 2023-05-31 DIAGNOSIS — R6521 Severe sepsis with septic shock: Secondary | ICD-10-CM | POA: Diagnosis not present

## 2023-05-31 DIAGNOSIS — N390 Urinary tract infection, site not specified: Secondary | ICD-10-CM | POA: Diagnosis not present

## 2023-05-31 LAB — CULTURE, BLOOD (ROUTINE X 2)

## 2023-05-31 LAB — CBC
HCT: 33.6 % — ABNORMAL LOW (ref 36.0–46.0)
Hemoglobin: 11.2 g/dL — ABNORMAL LOW (ref 12.0–15.0)
MCH: 29.3 pg (ref 26.0–34.0)
MCHC: 33.3 g/dL (ref 30.0–36.0)
MCV: 88 fL (ref 80.0–100.0)
Platelets: 60 10*3/uL — ABNORMAL LOW (ref 150–400)
RBC: 3.82 MIL/uL — ABNORMAL LOW (ref 3.87–5.11)
RDW: 13.3 % (ref 11.5–15.5)
WBC: 5.5 10*3/uL (ref 4.0–10.5)
nRBC: 0 % (ref 0.0–0.2)

## 2023-05-31 LAB — BASIC METABOLIC PANEL
Anion gap: 7 (ref 5–15)
BUN: 21 mg/dL (ref 8–23)
CO2: 23 mmol/L (ref 22–32)
Calcium: 7.7 mg/dL — ABNORMAL LOW (ref 8.9–10.3)
Chloride: 99 mmol/L (ref 98–111)
Creatinine, Ser: 1.38 mg/dL — ABNORMAL HIGH (ref 0.44–1.00)
GFR, Estimated: 40 mL/min — ABNORMAL LOW (ref >60)
Glucose, Bld: 238 mg/dL — ABNORMAL HIGH (ref 70–99)
Potassium: 3.3 mmol/L — ABNORMAL LOW (ref 3.5–5.1)
Sodium: 133 mmol/L — ABNORMAL LOW (ref 135–145)

## 2023-05-31 LAB — GLUCOSE, CAPILLARY
Glucose-Capillary: 199 mg/dL — ABNORMAL HIGH (ref 70–99)
Glucose-Capillary: 201 mg/dL — ABNORMAL HIGH (ref 70–99)
Glucose-Capillary: 222 mg/dL — ABNORMAL HIGH (ref 70–99)
Glucose-Capillary: 225 mg/dL — ABNORMAL HIGH (ref 70–99)
Glucose-Capillary: 246 mg/dL — ABNORMAL HIGH (ref 70–99)
Glucose-Capillary: 254 mg/dL — ABNORMAL HIGH (ref 70–99)

## 2023-05-31 LAB — MAGNESIUM: Magnesium: 1.8 mg/dL (ref 1.7–2.4)

## 2023-05-31 MED ORDER — POLYETHYLENE GLYCOL 3350 17 G PO PACK: 17.0000 g | PACK | Freq: Every day | ORAL | Status: DC | PRN

## 2023-05-31 MED ORDER — ADULT MULTIVITAMIN W/MINERALS CH
1.0000 | ORAL_TABLET | Freq: Every day | ORAL | Status: DC
  Administered 2023-05-31 – 2023-06-02 (×3): 1
  Filled 2023-05-31 (×3): qty 1

## 2023-05-31 MED ORDER — CALCIUM GLUCONATE-NACL 2-0.675 GM/100ML-% IV SOLN
2.0000 g | Freq: Once | INTRAVENOUS | Status: AC
  Administered 2023-05-31: 2000 mg via INTRAVENOUS
  Filled 2023-05-31: qty 100

## 2023-05-31 MED ORDER — INSULIN ASPART 100 UNIT/ML IJ SOLN
0.0000 [IU] | INTRAMUSCULAR | Status: DC
  Administered 2023-05-31: 8 [IU] via SUBCUTANEOUS
  Administered 2023-05-31 (×3): 5 [IU] via SUBCUTANEOUS
  Administered 2023-06-01: 2 [IU] via SUBCUTANEOUS
  Administered 2023-06-01 (×2): 3 [IU] via SUBCUTANEOUS
  Administered 2023-06-01: 2 [IU] via SUBCUTANEOUS
  Administered 2023-06-01 (×2): 3 [IU] via SUBCUTANEOUS

## 2023-05-31 MED ORDER — GLUCERNA SHAKE PO LIQD
237.0000 mL | Freq: Two times a day (BID) | ORAL | Status: DC
  Administered 2023-05-31 – 2023-06-03 (×6): 237 mL via ORAL

## 2023-05-31 MED ORDER — INSULIN GLARGINE-YFGN 100 UNIT/ML ~~LOC~~ SOLN
13.0000 [IU] | Freq: Two times a day (BID) | SUBCUTANEOUS | Status: DC
  Administered 2023-05-31 – 2023-06-11 (×23): 13 [IU] via SUBCUTANEOUS
  Filled 2023-05-31 (×24): qty 0.13

## 2023-05-31 MED ORDER — MAGNESIUM SULFATE 2 GM/50ML IV SOLN
2.0000 g | Freq: Once | INTRAVENOUS | Status: AC
Start: 2023-05-31 — End: 2023-05-31
  Administered 2023-05-31: 2 g via INTRAVENOUS
  Filled 2023-05-31: qty 50

## 2023-05-31 MED ORDER — INSULIN GLARGINE-YFGN 100 UNIT/ML ~~LOC~~ SOLN
10.0000 [IU] | Freq: Two times a day (BID) | SUBCUTANEOUS | Status: DC
  Filled 2023-05-31 (×2): qty 0.1

## 2023-05-31 MED ORDER — DOCUSATE SODIUM 100 MG PO CAPS: 100.0000 mg | ORAL_CAPSULE | Freq: Two times a day (BID) | ORAL | Status: DC | PRN

## 2023-05-31 MED ORDER — POTASSIUM CHLORIDE CRYS ER 20 MEQ PO TBCR
40.0000 meq | EXTENDED_RELEASE_TABLET | ORAL | Status: AC
  Administered 2023-05-31 (×2): 40 meq via ORAL
  Filled 2023-05-31 (×2): qty 2

## 2023-05-31 NOTE — Progress Notes (Signed)
[facility] ADULT ICU REPLACEMENT PROTOCOL   The patient does apply for the [facility] Adult ICU Electrolyte Replacment Protocol based on the criteria listed below:   1.Exclusion criteria: TCTS, ECMO, Dialysis, and Myasthenia Gravis patients 2. Is GFR >/= 30 ml/min? Yes.    Patient's GFR today is 40 3. Is SCr </= 2? Yes.   Patient's SCr is 1.38 mg/dL 4. Did SCr increase >/= 0.5 in 24 hours? No. 5.Pt's weight >40kg  Yes.   6. Abnormal electrolyte(s):   K 3.3, Mg 1.8   7. Electrolytes replaced per protocol 8.  Call MD STAT for K+ </= 2.5, Phos </= 1, or Mag </= 1 Physician:  Shawn Stall R Salem Mastrogiovanni 05/31/2023 5:34 AM

## 2023-05-31 NOTE — Progress Notes (Signed)
Nutrition Follow-up  DOCUMENTATION CODES:  Not applicable  INTERVENTION:  Continue current diet as ordered Feeding assistance MVI with minerals daily Glucerna Shake po BID, each supplement provides 220 kcal and 10 grams of protein  NUTRITION DIAGNOSIS:  Inadequate oral intake related to inability to eat as evidenced by NPO status. - remains applicable  GOAL:  Patient will meet greater than or equal to 90% of their needs - progressing, extubated, diet advanced  MONITOR:  PO intake, Supplement acceptance, Labs, Weight trends  REASON FOR ASSESSMENT:  Ventilator    ASSESSMENT:  Pt with hx of atrial fibrillation, DM type 2, and hx of SBO requiring resection presented to ED from her ALF with AMS and fatigue. Workup in ED consistent with severe sepsis due to UTI.   1/20 - Op, left ureteral stent placement, left intubated after surgery  1/22 - extubated  Pt resting in bedside chair at the time of assessment napping. Noted good intake of meals in flowsheet. Discussed with RN who confirms that pt is eating well. She does require feeding assistance but that she has a good appetite has long as she has help. CBGs remain elevated, will keep on carb modified diet as intake remains adequate.  Average Meal Intake: 1/22-1/23: 83% average intake x 3 recorded meals  Admit weight: 73.9 kg  Current weight: 77.5 kg   Intake/Output Summary (Last 24 hours) at 05/31/2023 0945 Last data filed at 05/31/2023 0800 Gross per 24 hour  Intake 1854.26 ml  Output 1545 ml  Net 309.26 ml  Net IO Since Admission: 9,127.85 mL [05/31/23 0945]  Drains/Lines: UOP x 24 hours  Nutritionally Relevant Medications: Scheduled Meds:  insulin aspart  0-15 Units Subcutaneous Q4H   insulin glargine-yfgn  13 Units Subcutaneous BID   potassium chloride  40 mEq Oral Q4H   rosuvastatin  2.5 mg Oral Daily   Continuous Infusions:  calcium gluconate     cefTRIAXone (ROCEPHIN)  IV Stopped (05/31/23 0605)    PRN Meds: docusate sodium, ondansetron, polyethylene glycol  Labs Reviewed: Na 133 K 3.3 creatinine 1.38 CBG ranges from 119-246 mg/dL over the last 24 hours HgbA1c 7.0%  NUTRITION - FOCUSED PHYSICAL EXAM: Flowsheet Row Most Recent Value  Orbital Region Mild depletion  Upper Arm Region No depletion  Thoracic and Lumbar Region No depletion  Buccal Region No depletion  Temple Region No depletion  Clavicle Bone Region No depletion  Clavicle and Acromion Bone Region No depletion  Scapular Bone Region No depletion  Dorsal Hand No depletion  Patellar Region No depletion  Anterior Thigh Region No depletion  Posterior Calf Region No depletion  Edema (RD Assessment) Severe  [generalized, BLE, BUE]  Hair Reviewed  Critias.Bumpers thin]  Eyes Reviewed  Mouth Reviewed  Skin Reviewed  Nails Reviewed   Diet Order:   Diet Order             Diet Carb Modified Fluid consistency: Thin; Room service appropriate? Yes  Diet effective now                   EDUCATION NEEDS:  Not appropriate for education at this time  Skin:  Skin Assessment: Reviewed RN Assessment  Last BM:  1/22 - type 5  Height: Ht Readings from Last 1 Encounters:  05/28/23 5\' 7"  (1.702 m)    Weight:  Wt Readings from Last 1 Encounters:  05/30/23 77.5 kg    Ideal Body Weight:  61.4 kg  BMI:  Body mass index is 26.76  kg/m.  Estimated Nutritional Needs:  Kcal:  1600-1800 kcal/d Protein:  75-90g/d Fluid:  >/=1.8L/d    Greig Castilla, RD, LDN Registered Dietitian II Please reach out via secure chat Weekend on-call pager # available in [facility]

## 2023-05-31 NOTE — Evaluation (Signed)
Physical Therapy Evaluation Patient Details Name: Deanna Shea MRN: [id number] DOB: [birth year] Today's Date: 05/31/2023  History of Present Illness  78 year old woman who presented to [facility] ED 1/20 for AMS and lethargy.  Found to have ?DKA, urosepsis.ObstructedL UVJ calculus with obstructive uropathy. Uro consulted. Taken to OR  05/28/23 for L ureteral stent. PMHx significant for Afib (on ASA/digoxin/propranolol), mitral valve prolapse, HLD, T2DM, nephrolithiasis, hypothyroidism, anxiety/depression, schizophrenia.    Clinical Impression  Pt admitted with above diagnosis. Limited hx of PLOF but appears to be from ALF functioning at a Mod I level with a rollator for gait, has meals made by facility. She is currently very weak, requires up to mod assist for bed mobility, sit to stand transitions, and transfer out of bed. Demonstrates significant posterior lean, rapid fatigue in standing, and bowel incontinence. Patient will benefit from continued inpatient follow up therapy, <3 hours/day. Pt currently with functional limitations due to the deficits listed below (see PT Problem List). Pt will benefit from acute skilled PT to increase their independence and safety with mobility to allow discharge.       Supine BP 112/50, seated EOB 133/63, and in chair after transfer to recliner 148/61.  After transfer to recliner: HR 102, RR 28, SPO2 95% on RA.      If plan is discharge home, recommend the following: A lot of help with bathing/dressing/bathroom;Two people to help with walking and/or transfers;Assistance with cooking/housework;Direct supervision/assist for medications management;Direct supervision/assist for financial management;Assist for transportation;Supervision due to cognitive status   Can travel by private vehicle   No    Equipment Recommendations  (TBD next venue of care)  Recommendations for Other Services       Functional Status Assessment Patient has had a recent decline in their  functional status and demonstrates the ability to make significant improvements in function in a reasonable and predictable amount of time.     Precautions / Restrictions Precautions Precautions: Fall Restrictions Weight Bearing Restrictions Per Provider Order: No      Mobility  Bed Mobility Overal bed mobility: Needs Assistance Bed Mobility: Supine to Sit     Supine to sit: Mod assist     General bed mobility comments: Mod assist for trunk support to rise and to move LEs off bed. Very weak, cues for technique. Slow processing and difficulty sequencing    Transfers Overall transfer level: Needs assistance Equipment used: Rolling walker (2 wheels) Transfers: Sit to/from Stand, Bed to chair/wheelchair/BSC Sit to Stand: Mod assist, From elevated surface Stand pivot transfers: Mod assist, From elevated surface         General transfer comment: Mod assist for boost to stand x3 from EOB which was elevated slightly. Demonstrates heavy posterior lean, difficulty sequencing requiring mod assist to facilitate foward weight shift onto RW, max VC, and support for balance. Pt with bowel incontinence upon first rising. Assisted with peri-care in standing - tolerated up to 1 min stand ea attempt, fatigues quickly. Of note, LEs begin to buckle while pivoting and she attempts to sit prematurely.    Ambulation/Gait             Pre-gait activities: weight shifting lateral and a/p (difficulty  with forward weight shift, calves tight.)    Stairs            Wheelchair Mobility     Tilt Bed    Modified Rankin (Stroke Patients Only)       Balance Overall balance assessment: Needs assistance Sitting-balance support: No upper extremity  supported, Feet supported Sitting balance-Leahy Scale: Fair Sitting balance - Comments: CGA at EOB   Standing balance support: Bilateral upper extremity supported Standing balance-Leahy Scale: Poor Standing balance comment: Mod assist to  stand                             Pertinent Vitals/Pain Pain Assessment Pain Assessment: No/denies pain    Home Living Family/patient expects to be discharged to:: Assisted living ("[facility]")                 Home Equipment: Rollator (4 wheels) Additional Comments: Limited hx provided due to impaired cognition    Prior Function Prior Level of Function : History of Falls (last six months);Patient poor historian/Family not available (Fall in August of last year resulting in admission)             Mobility Comments: States she walks with a rollator ADLs Comments: Reports Ind (questionable historian due to AMS) Walks to community room for meals that are prepared for residents at ALF     Extremity/Trunk Assessment   Upper Extremity Assessment Upper Extremity Assessment: Defer to OT evaluation    Lower Extremity Assessment Lower Extremity Assessment: Generalized weakness;Difficult to assess due to impaired cognition       Communication   Communication Communication: No apparent difficulties  Cognition Arousal: Alert Behavior During Therapy: Flat affect Overall Cognitive Status: Impaired/Different from baseline Area of Impairment: Orientation, Following commands, Safety/judgement, Problem solving                 Orientation Level: Disoriented to, Time, Situation     Following Commands: Follows one step commands inconsistently, Follows one step commands with increased time Safety/Judgement: Decreased awareness of safety, Decreased awareness of deficits   Problem Solving: Slow processing, Decreased initiation, Difficulty sequencing, Requires verbal cues, Requires tactile cues General Comments: Impaired short term memory; could not recall answers <1 minute after being reoriented.        General Comments General comments (skin integrity, edema, etc.): HR 102, RR 28 after transfer with SPO2 95% on RA. Supine BP 112/50, seated EOB 133/63, and  reclined in chair 148/61.    Exercises General Exercises - Lower Extremity Ankle Circles/Pumps: AROM, Both, 10 reps, Supine Quad Sets: Strengthening, Both, 10 reps, Supine Long Arc Quad: Strengthening, Both, 10 reps, Seated   Assessment/Plan    PT Assessment Patient needs continued PT services  PT Problem List Decreased strength;Decreased range of motion;Decreased activity tolerance;Decreased balance;Decreased mobility;Decreased coordination;Decreased cognition;Decreased knowledge of use of DME;Decreased safety awareness;Decreased knowledge of precautions;Cardiopulmonary status limiting activity;Obesity       PT Treatment Interventions DME instruction;Gait training;Functional mobility training;Therapeutic activities;Therapeutic exercise;Balance training;Neuromuscular re-education;Cognitive remediation;Patient/family education    PT Goals (Current goals can be found in the Care Plan section)  Acute Rehab PT Goals Patient Stated Goal: Get well PT Goal Formulation: With patient Time For Goal Achievement: 06/14/23 Potential to Achieve Goals: Good    Frequency Min 1X/week     Co-evaluation               AM-PAC PT "6 Clicks" Mobility  Outcome Measure Help needed turning from your back to your side while in a flat bed without using bedrails?: A Little Help needed moving from lying on your back to sitting on the side of a flat bed without using bedrails?: A Lot Help needed moving to and from a bed to a chair (including a wheelchair)?: A Lot  Help needed standing up from a chair using your arms (e.g., wheelchair or bedside chair)?: A Lot Help needed to walk in hospital room?: Total Help needed climbing 3-5 steps with a railing? : Total 6 Click Score: 11    End of Session Equipment Utilized During Treatment: Gait belt Activity Tolerance: Patient tolerated treatment well (Weakness) Patient left: in chair;with call bell/phone within reach;with chair alarm set;with SCD's  reapplied Nurse Communication: Mobility status;Need for lift equipment (+2 with RW, vs Stedy) PT Visit Diagnosis: Unsteadiness on feet (R26.81);Other symptoms and signs involving the nervous system (R29.898);Difficulty in walking, not elsewhere classified (R26.2);History of falling (Z91.81);Muscle weakness (generalized) (M62.81)    Time: 4401-0272 PT Time Calculation (min) (ACUTE ONLY): 29 min   Charges:   PT Evaluation $PT Eval Moderate Complexity: 1 Mod PT Treatments $Therapeutic Activity: 8-22 mins PT General Charges $$ ACUTE PT VISIT: 1 Visit         Kathlyn Sacramento, PT, DPT [facility]  Rehabilitation Services Physical Therapist Office: [phone] Website: [email or web address]   Berton Mount 05/31/2023, 10:15 AM

## 2023-05-31 NOTE — Progress Notes (Signed)
NAME:  Deanna Shea, MRN:  [id number], DOB:  06-Dec-1945, LOS: 3 ADMISSION DATE:  05/28/2023 CONSULTATION DATE:  05/28/2023 REFERRING MD:  Durwin Nora - EDP CHIEF COMPLAINT:  Urosepsis   History of Present Illness:  78 year old woman who presented to [facility] ED 1/20 for AMS and lethargy. PMHx significant for Afib (on ASA/digoxin/propranolol), mitral valve prolapse, HLD, T2DM, nephrolithiasis, hypothyroidism, anxiety/depression, schizophrenia.  Patient initially presented to [facility] ED from her assisted living facility for AMS and lethargy x 1 day; she was reportedly confused to place/situation and hyperglycemic to 500s. At baseline, alert and oriented. On ED arrival, patient was afebrile, HR 99 (on BB), BP 108/50, RR 30, SpO2 97%. Labs were notable for WBC 6.7, Hgb 15.4 (baseline 12-15), Plt 121 (baseline 245-300). INR 1.4. Na 126, K 5.1, CO2 14, Cr 2.48 (baseline 0.6-1), glucose 501, Mg 1.4. CK 150. TSH 4.1. Dig level WNL. Trop 34 (likely demand-related), BNP 89. B-hydroxybutyrate 1.19. LA 5.5 > 6.5. UA with > 500 glucose, moderate Hgb, 5 ketones, 100 protein, moderate leuks. COVID/Flu/RSV negative and BCx sent. Empiric broad-spectrum antibiotics were started for presumed sepsis. CT Head NAICA, CT Chest/A/P demonstrated obstructing  7mm UVJ calculus with moderate L-sided obstructive uropathy, L renal edema and perinephric stranding; gas was noted in the bladder, L ureter and L renal collecting system. Urology was consulted with plan for L ureteral stent placement.  PCCM consulted for ICU admission postoperatively.  Pertinent Medical History:   Past Medical History:  Diagnosis Date   Allergic rhinitis    Anxiety    Atrial fibrillation (HCC) 10/28/2013   diagnoses in the 20's, well controlled with Digoxin and Inderal    Depression    Diabetes mellitus without complication (HCC)    Gall stones    Heart murmur    Kidney disease, medullary sponge    Kidney stones    Mitral valve disorders(424.0) 10/28/2013    Mitral valve prolapse    congenital    MRSA (methicillin resistant Staphylococcus aureus)    9/10   Osteopenia    dexa 4/09, improved but not resolved 2011 DEXA   Palpitations 10/28/2013   Schizophrenia (HCC)    Shingles    2000   Significant Hospital Events: Including procedures, antibiotic start and stop dates in addition to other pertinent events   1/20 - Presented to [facility] ED for AMS/lethargy. Found to have ?DKA, urosepsis. Elevated LA. CT A/P showed obstructing L UVJ calculus with obstructive uropathy. Uro consulted. Taken to OR for L ureteral stent. PCCM consulted for ICU admission.  Interim History / Subjective:  Patient continued his fever, currently at 100.6 She is off vasopressor support She was successfully extubated yesterday, currently on room air  Objective:  Blood pressure (!) 113/56, pulse 88, temperature 99 F (37.2 C), temperature source Axillary, resp. rate (!) 25, height 5\' 7"  (1.702 m), weight 77.5 kg, SpO2 98%.        Intake/Output Summary (Last 24 hours) at 05/31/2023 0840 Last data filed at 05/31/2023 0800 Gross per 24 hour  Intake 2260.52 ml  Output 1545 ml  Net 715.52 ml   Filed Weights   05/29/23 0122 05/29/23 2100 05/30/23 0442  Weight: 79.2 kg 77.5 kg 77.5 kg   Physical Examination: General: Elderly female, lying on the bed HEENT: [place]/AT, eyes anicteric.  moist mucus membranes Neuro: Alert, awake following commands.  Generalized weak Chest: Coarse breath sounds, no wheezes or rhonchi Heart: Regular rate and rhythm, no murmurs or gallops Abdomen: Soft, nontender, nondistended, bowel sounds present Skin:  No rash   Labs and images reviewed  Resolved Hospital Problem List:  Hyperkalemia  Assessment & Plan:  Severe sepsis with septic shock due to complicated E. coli urinary tract infection with obstructive uropathy, POA Obstructive uropathy due to obstructing L UVJ calculus status post stent E. coli bacteremia Lactic acidosis Patient  continued to spike fever, now Tmax is 100.6 Continue IV ceftriaxone, increase to 2 g daily Her urine and blood cultures are growing E. coli which is pansensitive If she continues to spike fever, she will need CT abdomen to rule out renal abscess Appreciate urology follow-up Lactate has trended down  Acute respiratory insufficiency, postprocedure, resolved Patient was successfully extubated yesterday, currently on room air  Diabetic ketoacidosis Blood sugars are not well-controlled She was transitioned off of insulin infusion yesterday Will increase Semglee to 13 units twice daily Continue sliding scale insulin with CBG goal 140-180  Acute metabolic/septic encephalopathy Patient presented with altered mental status, she is here with septic shock also she has DKA Off sedation, she is just lethargic but follows commands  AKI in the setting of obstructive uropathy and septic ATN AGMA Hypomagnesemia Hyponatremia/hypocalcemia/hypokalemia Patient serum creatinine continue to trend down Encourage oral fluid intake Serum sodium is at 133, potassium dropped down to 3.3 Continue aggressive electrolyte replacement and monitor Avoid nephrotoxic agent  Paroxysmal A-fib Patient is in sinus rhythm Continue aspirin for stroke prophylaxis  Hypothyroidism Continue home Synthroid  Depression Anxiety Schizophrenia Continue risperidone, Lexapro as clinically appropriate  Thrombocytopenia due to critical illness Patient's platelet count slightly up today to 60 likely due to sepsis with shock  Watch for signs of bleeding, repeat platelet count in the morning  Best Practice: (right click and "Reselect all SmartList Selections" daily)   Diet/type: Regular consistency DVT prophylaxis: Heparin subcu GI prophylaxis: N/A Lines: Yes, still needed Foley:  Yes, and it is still needed Code Status:  full code Last date of multidisciplinary goals of care discussion: 1/22: Patient's son was updated  over the phone, decision was to continue full scope of care  Labs:  CBC: Recent Labs  Lab 05/28/23 1829 05/28/23 1836 05/28/23 2346 05/29/23 0019 05/29/23 0429 05/29/23 0859 05/30/23 0429 05/31/23 0400  WBC 6.7  --  4.8  --  8.1  --  7.4 5.5  NEUTROABS 5.4  --   --   --   --   --   --   --   HGB 15.4*   < > 12.8 12.9 12.9 12.6 11.8* 11.2*  HCT 46.7*   < > 39.1 38.0 37.3 37.0 34.2* 33.6*  MCV 88.8  --  89.5  --  86.3  --  87.5 88.0  PLT 121*  --  80*  --  90*  --  55* 60*   < > = values in this interval not displayed.   Basic Metabolic Panel: Recent Labs  Lab 05/28/23 1829 05/28/23 1836 05/28/23 2217 05/28/23 2346 05/29/23 0019 05/29/23 0429 05/29/23 0859 05/30/23 0429 05/31/23 0400  NA 126*   < > 132* 128* 131* 130* 135 136 133*  K 5.1   < > 4.0 3.8 3.8 4.0 3.5 3.5 3.3*  CL 92*   < > 97* 99  --  101  --  107 99  CO2 14*  --   --  14*  --  18*  --  24 23  GLUCOSE 501*   < > 429* 414*  --  274*  --  140* 238*  BUN 32*   < >  28* 30*  --  30*  --  22 21  CREATININE 2.48*   < > 1.80* 1.83*  --  1.76*  --  1.44* 1.38*  CALCIUM 8.5*  --   --  7.6*  --  7.7*  --  7.9* 7.7*  MG 1.4*  --   --   --   --  1.8  --   --  1.8  PHOS  --   --   --   --   --  2.6  --   --   --    < > = values in this interval not displayed.   GFR: Estimated Creatinine Clearance (by C-G formula based on SCr of 1.38 mg/dL (H)) Female: 16.1 mL/min (A) Female: 43.2 mL/min (A) Recent Labs  Lab 05/28/23 2054 05/28/23 2346 05/29/23 0429 05/29/23 0435 05/30/23 0429 05/30/23 0942 05/31/23 0400  WBC  --  4.8 8.1  --  7.4  --  5.5  LATICACIDVEN 6.5* 6.3*  --  4.6*  --  2.6*  --    Liver Function Tests: Recent Labs  Lab 05/28/23 1829  AST 49*  ALT 40  ALKPHOS 57  BILITOT 1.5*  PROT 6.5  ALBUMIN 3.3*   No results for input(s): "LIPASE", "AMYLASE" in the last 168 hours. Recent Labs  Lab 05/28/23 1820  AMMONIA 19   ABG:    Component Value Date/Time   PHART 7.384 05/29/2023 0859    PCO2ART 34.5 05/29/2023 0859   PO2ART 124 (H) 05/29/2023 0859   HCO3 20.5 05/29/2023 0859   TCO2 21 (L) 05/29/2023 0859   ACIDBASEDEF 4.0 (H) 05/29/2023 0859   O2SAT 99 05/29/2023 0859    Coagulation Profile: Recent Labs  Lab 05/28/23 1829  INR 1.4*   Cardiac Enzymes: Recent Labs  Lab 05/28/23 1829  CKTOTAL 150   HbA1C: Hgb A1c MFr Bld  Date/Time Value Ref Range Status  01/03/2023 02:23 PM 7.0 (H) 4.8 - 5.6 % Final    Comment:    (NOTE) Pre diabetes:          5.7%-6.4%  Diabetes:              >6.4%  Glycemic control for   <7.0% adults with diabetes    CBG: Recent Labs  Lab 05/30/23 1531 05/30/23 1941 05/31/23 0005 05/31/23 0402 05/31/23 0747  GLUCAP 161* 192* 199* 201* 246*      Cheri Fowler, MD [facility] See Amion for pager If no response to pager, please call [phone] until 7pm After 7pm, Please call E-link [phone]

## 2023-06-01 DIAGNOSIS — N390 Urinary tract infection, site not specified: Secondary | ICD-10-CM | POA: Diagnosis not present

## 2023-06-01 DIAGNOSIS — A419 Sepsis, unspecified organism: Secondary | ICD-10-CM | POA: Diagnosis not present

## 2023-06-01 LAB — PHOSPHORUS: Phosphorus: 1.6 mg/dL — ABNORMAL LOW (ref 2.5–4.6)

## 2023-06-01 LAB — BASIC METABOLIC PANEL
Anion gap: 9 (ref 5–15)
BUN: 22 mg/dL (ref 8–23)
CO2: 24 mmol/L (ref 22–32)
Calcium: 8.7 mg/dL — ABNORMAL LOW (ref 8.9–10.3)
Chloride: 105 mmol/L (ref 98–111)
Creatinine, Ser: 1.18 mg/dL — ABNORMAL HIGH (ref 0.44–1.00)
GFR, Estimated: 48 mL/min — ABNORMAL LOW (ref >60)
Glucose, Bld: 174 mg/dL — ABNORMAL HIGH (ref 70–99)
Potassium: 3.7 mmol/L (ref 3.5–5.1)
Sodium: 138 mmol/L (ref 135–145)

## 2023-06-01 LAB — GLUCOSE, CAPILLARY
Glucose-Capillary: 123 mg/dL — ABNORMAL HIGH (ref 70–99)
Glucose-Capillary: 143 mg/dL — ABNORMAL HIGH (ref 70–99)
Glucose-Capillary: 144 mg/dL — ABNORMAL HIGH (ref 70–99)
Glucose-Capillary: 161 mg/dL — ABNORMAL HIGH (ref 70–99)
Glucose-Capillary: 173 mg/dL — ABNORMAL HIGH (ref 70–99)
Glucose-Capillary: 177 mg/dL — ABNORMAL HIGH (ref 70–99)
Glucose-Capillary: 182 mg/dL — ABNORMAL HIGH (ref 70–99)

## 2023-06-01 LAB — CBC
HCT: 34.8 % — ABNORMAL LOW (ref 36.0–46.0)
Hemoglobin: 11.8 g/dL — ABNORMAL LOW (ref 12.0–15.0)
MCH: 29.6 pg (ref 26.0–34.0)
MCHC: 33.9 g/dL (ref 30.0–36.0)
MCV: 87.2 fL (ref 80.0–100.0)
Platelets: 82 10*3/uL — ABNORMAL LOW (ref 150–400)
RBC: 3.99 MIL/uL (ref 3.87–5.11)
RDW: 13.4 % (ref 11.5–15.5)
WBC: 6.1 10*3/uL (ref 4.0–10.5)
nRBC: 0 % (ref 0.0–0.2)

## 2023-06-01 LAB — MAGNESIUM: Magnesium: 1.7 mg/dL (ref 1.7–2.4)

## 2023-06-01 MED ORDER — PROPRANOLOL HCL 20 MG PO TABS
20.0000 mg | ORAL_TABLET | Freq: Four times a day (QID) | ORAL | Status: DC
  Filled 2023-06-01 (×2): qty 1

## 2023-06-01 MED ORDER — K PHOS MONO-SOD PHOS DI & MONO 155-852-130 MG PO TABS
500.0000 mg | ORAL_TABLET | Freq: Two times a day (BID) | ORAL | Status: DC
  Administered 2023-06-01 – 2023-06-02 (×4): 500 mg via ORAL
  Filled 2023-06-01 (×4): qty 2

## 2023-06-01 MED ORDER — PROPRANOLOL HCL 20 MG PO TABS
20.0000 mg | ORAL_TABLET | Freq: Four times a day (QID) | ORAL | Status: DC
  Administered 2023-06-01 – 2023-06-11 (×36): 20 mg via ORAL
  Filled 2023-06-01 (×42): qty 1

## 2023-06-01 MED ORDER — CEFAZOLIN SODIUM-DEXTROSE 2-4 GM/100ML-% IV SOLN
2.0000 g | Freq: Three times a day (TID) | INTRAVENOUS | Status: DC
  Administered 2023-06-02 – 2023-06-05 (×10): 2 g via INTRAVENOUS
  Filled 2023-06-01 (×10): qty 100

## 2023-06-01 MED ORDER — MAGNESIUM SULFATE 2 GM/50ML IV SOLN
2.0000 g | Freq: Once | INTRAVENOUS | Status: AC
  Administered 2023-06-01: 2 g via INTRAVENOUS
  Filled 2023-06-01: qty 50

## 2023-06-01 NOTE — Hospital Course (Signed)
78 years old female with past medical history of atrial fibrillation, hyperlipidemia, type 2 diabetes, hypothyroidism, anxiety, depression, schizophrenia who initially presented to the hospital from assisted living facility with altered mental status and lethargy.  She was also noted to have hyperglycemia with blood glucose levels in the 500s.  In the ED vitals were stable.  Labs showed hemoglobin of 15.4.  Platelet was low at 121.  Sodium was slightly low at 126 with potassium high at 5.1 with bicarb of 14 and creatinine of 2.4 from baseline 0.61.  Magnesium was low at 1.4 as well.  Troponin was slightly elevated with beta hydroxybutyrate elevation and lactic acid was elevated of 6.5.  Patient was started on broad-spectrum antibiotic for presumed sepsis.  CT head scan was negative for acute findings., CT chest abdomen pelvis showed a 7 mm UVJ calculus with moderate left obstructive hydronephrosis.  There was some gas noted in the bladder and left ureter and collecting system.  Urology was consulted for left ureteral stent placement.  Patient was then admitted to the ICU postoperatively.  Severe sepsis with septic shock due to complicated E. coli urinary tract infection with obstructive uropathy,  E. coli bacteremia Lactic acidosis Patient had obstructive uropathy due to obstructing L UVJ calculus status post stent by urology on 05/28/2023. Temperature max of 99.9 F.  Continue IV Rocephin 2 g daily.  Urine and blood culture showing pansensitive E. coli.If she continues to spike fever, she will need CT abdomen to rule out renal abscess Urology following.  CBC from today at 6.1.   Acute respiratory insufficiency, postprocedure, resolved Patient was successfully extubated  currently on room air   Diabetic ketoacidosis DKA has resolved.  Continue on insulin regimen.  Currently on Semglee  13 units twice daily Continue sliding scale insulin    Acute metabolic/septic encephalopathy Secondary to sepsis and  DKA.  Following commands.   AKI, anion gap metabolic acidosis Creatinine today at 1.1.  Initial creatinine of 2.4.  Has improved at this time.  Hypomagnesemia Replenished and improved.  Latest magnesium of 1.7.  Hyponatremia Improved at this time..  Latest sodium of 138  Hypokalemia Improved at this time.  Latest potassium of 3.7.  Hypophosphatemia.  Will replenish with Neutra-Phos.  Check levels in AM.  Paroxysmal A-fib On sinus rhythm at this time.  Continue aspirin for prophylaxis.   Hypothyroidism Continue home Synthroid   Depression/Anxiety/Schizophrenia Continue risperidone, Lexapro as clinically appropriate   Thrombocytopenia due to critical illness Improving platelet count 82 from 60.  Continue to monitor.

## 2023-06-01 NOTE — Progress Notes (Signed)
SLP Cancellation Note  Patient Details Name: Deanna Shea MRN: [id number] DOB: 1946-01-06   Cancelled treatment:       Reason Eval/Treat Not Completed: Fatigue/lethargy limiting ability to participate. Pt is not currently demonstrating adequate alertness to participate in a cognitive-linguistic evaluation. Will f/u as time permits.   Gwynneth Aliment, M.A., CF-SLP Speech Language Pathology, Acute Rehabilitation Services  Secure Chat preferred [phone]  06/01/2023, 3:30 PM

## 2023-06-01 NOTE — Progress Notes (Signed)
PROGRESS NOTE  Deanna Shea JXB:[id number] DOB: 1946-01-07 DOA: 05/28/2023 PCP: Darrow Bussing, MD   LOS: 4 days   Brief narrative:  78 years old female with past medical history of atrial fibrillation, hyperlipidemia, type 2 diabetes, hypothyroidism, anxiety, depression, schizophrenia who initially presented to the hospital from assisted living facility with altered mental status and lethargy.  She was also noted to have hyperglycemia with blood glucose levels in the 500s.  In the ED vitals were stable.  Labs showed hemoglobin of 15.4.  Platelet was low at 121.  Sodium was slightly low at 126 with potassium high at 5.1 with bicarb of 14 and creatinine of 2.4 from baseline 0.61.  Magnesium was low at 1.4 as well.  Troponin was slightly elevated with beta hydroxybutyrate elevation and lactic acid was elevated of 6.5.  Patient was started on broad-spectrum antibiotic for presumed sepsis.  CT head scan was negative for acute findings., CT chest abdomen pelvis showed a 7 mm UVJ calculus with moderate left obstructive hydronephrosis.  There was some gas noted in the bladder and left ureter and collecting system.  Urology was consulted for left ureteral stent placement.  Patient was then admitted to the ICU postoperatively.  At this time patient has been considered stable for transfer out of ICU.  Principal Problem:   Sepsis due to urinary tract infection (HCC) Active Problems:   AKI (acute kidney injury) (HCC)   Acute encephalopathy   Diabetic ketoacidosis without coma associated with type 2 diabetes mellitus (HCC)   Ureterolithiasis   Septic shock (HCC)   Severe sepsis with septic shock due to complicated E. coli urinary tract infection with obstructive uropathy,  E. coli bacteremia Lactic acidosis Patient had obstructive uropathy due to obstructing L UVJ calculus status post stent by urology on 05/28/2023. Temperature max of 99.9 F.  Continue IV Rocephin 2 g daily.  Urine and blood culture  showing pansensitive E. coli.If she continues to spike fever, she will need CT abdomen to rule out renal abscess. Urology following.  CBC from today at 6.1.   Acute respiratory insufficiency, postprocedure, resolved Patient was successfully extubated  currently on room air   Diabetic ketoacidosis DKA has resolved.  Continue on insulin regimen.  Currently on Semglee  13 units twice daily, Continue sliding scale insulin.  Overall glycemic control has improved.   Acute metabolic/septic encephalopathy Secondary to sepsis and DKA.  Following commands.   AKI, anion gap metabolic acidosis Creatinine today at 1.1.  Initial creatinine of 2.4.  Has improved at this time.  Hypomagnesemia Replenished and improved.  Latest magnesium of 1.7.  Hyponatremia Improved at this time..  Latest sodium of 138  Hypokalemia Improved at this time.  Latest potassium of 3.7.  Hypophosphatemia.  Will replenish with Neutra-Phos.  Check levels in AM.  Paroxysmal A-fib On sinus rhythm at this time.  Continue aspirin for prophylaxis.   Hypothyroidism Continue home Synthroid   Depression/Anxiety/Schizophrenia Continue risperidone, Lexapro as clinically appropriate   Thrombocytopenia due to critical illness Improving platelet count 82 from 60.  Continue to monitor.   Assessment/Plan: DVT prophylaxis: heparin injection 5,000 Units Start: 05/29/23 0600 SCDs Start: 05/28/23 2155   Disposition: Skilled nursing facility as per PT recommendation  Status is: Inpatient Remains inpatient appropriate because: IV antibiotics, septic shock, need for rehabilitation    Code Status:     Code Status: Full Code  Family Communication: None at bedside  Consultants: PCCM Urology  Procedures: Left ureteric stent placement by urology 05/28/2023  Anti-infectives:  Rocephin IV  Anti-infectives (From admission, onward)    Start     Dose/Rate Route Frequency Ordered Stop   05/31/23 0600  cefTRIAXone (ROCEPHIN)  2 g in sodium chloride 0.9 % 100 mL IVPB        2 g 200 mL/hr over 30 Minutes Intravenous Every 24 hours 05/30/23 0855 06/07/23 2359   05/30/23 0945  cefTRIAXone (ROCEPHIN) 1 g in sodium chloride 0.9 % 100 mL IVPB        1 g 200 mL/hr over 30 Minutes Intravenous  Once 05/30/23 0856 05/30/23 0937   05/29/23 0600  cefTRIAXone (ROCEPHIN) 1 g in sodium chloride 0.9 % 100 mL IVPB  Status:  Discontinued        1 g 200 mL/hr over 30 Minutes Intravenous Every 24 hours 05/28/23 2204 05/30/23 0855   05/28/23 2127  vancomycin (VANCOCIN) 1-5 GM/200ML-% IVPB       Note to Pharmacy: Brien Mates D: cabinet override      05/28/23 2127 05/29/23 0929   05/28/23 2000  vancomycin (VANCOREADY) IVPB 1500 mg/300 mL  Status:  Discontinued        1,500 mg 150 mL/hr over 120 Minutes Intravenous  Once 05/28/23 1957 05/28/23 2203   05/28/23 1900  Vancomycin (VANCOCIN) 1,500 mg in sodium chloride 0.9 % 500 mL IVPB  Status:  Discontinued        1,500 mg 250 mL/hr over 120 Minutes Intravenous  Once 05/28/23 1853 05/28/23 1958   05/28/23 1845  ceFEPIme (MAXIPIME) 2 g in sodium chloride 0.9 % 100 mL IVPB        2 g 200 mL/hr over 30 Minutes Intravenous  Once 05/28/23 1843 05/28/23 1935   05/28/23 1845  metroNIDAZOLE (FLAGYL) IVPB 500 mg        500 mg 100 mL/hr over 60 Minutes Intravenous  Once 05/28/23 1843 05/28/23 2048   05/28/23 1845  vancomycin (VANCOCIN) IVPB 1000 mg/200 mL premix  Status:  Discontinued        1,000 mg 200 mL/hr over 60 Minutes Intravenous  Once 05/28/23 1843 05/28/23 1853        Subjective: Today, patient was seen and examined at bedside.  Patient states that she has no pain nausea vomiting shortness of breath cough.  Denies any urinary urgency frequency dysuria.  Objective: Vitals:   06/01/23 0700 06/01/23 0756  BP: (!) 141/68   Pulse: 84   Resp: (!) 26   Temp:  98.8 F (37.1 C)  SpO2: 97%     Intake/Output Summary (Last 24 hours) at 06/01/2023 0930 Last data filed at  05/31/2023 1900 Gross per 24 hour  Intake 340 ml  Output 200 ml  Net 140 ml   Filed Weights   05/29/23 2100 05/30/23 0442 06/01/23 0500  Weight: 77.5 kg 77.5 kg 79.5 kg   Body mass index is 27.45 kg/m.   Physical Exam:  GENERAL: Patient is alert awake Not in obvious distress.  Communicative. HENT: No scleral pallor or icterus. Pupils equally reactive to light. Oral mucosa is moist NECK: is supple, no gross swelling noted. CHEST: Clear to auscultation. No crackles or wheezes.  Diminished breath sounds bilaterally. CVS: S1 and S2 heard, no murmur. Regular rate and rhythm.  ABDOMEN: Soft, non-tender, bowel sounds are present. EXTREMITIES: No edema. CNS: Cranial nerves are intact. No focal motor deficits. SKIN: warm and dry without rashes.  Data Review: I have personally reviewed the following laboratory data and studies,  CBC: Recent Labs  Lab 05/28/23 1829  05/28/23 1836 05/28/23 2346 05/29/23 0019 05/29/23 0429 05/29/23 0859 05/30/23 0429 05/31/23 0400 06/01/23 0437  WBC 6.7  --  4.8  --  8.1  --  7.4 5.5 6.1  NEUTROABS 5.4  --   --   --   --   --   --   --   --   HGB 15.4*   < > 12.8   < > 12.9 12.6 11.8* 11.2* 11.8*  HCT 46.7*   < > 39.1   < > 37.3 37.0 34.2* 33.6* 34.8*  MCV 88.8  --  89.5  --  86.3  --  87.5 88.0 87.2  PLT 121*  --  80*  --  90*  --  55* 60* 82*   < > = values in this interval not displayed.   Basic Metabolic Panel: Recent Labs  Lab 05/28/23 1829 05/28/23 1836 05/28/23 2346 05/29/23 0019 05/29/23 0429 05/29/23 0859 05/30/23 0429 05/31/23 0400 06/01/23 0437  NA 126*   < > 128*   < > 130* 135 136 133* 138  K 5.1   < > 3.8   < > 4.0 3.5 3.5 3.3* 3.7  CL 92*   < > 99  --  101  --  107 99 105  CO2 14*  --  14*  --  18*  --  24 23 24   GLUCOSE 501*   < > 414*  --  274*  --  140* 238* 174*  BUN 32*   < > 30*  --  30*  --  22 21 22   CREATININE 2.48*   < > 1.83*  --  1.76*  --  1.44* 1.38* 1.18*  CALCIUM 8.5*  --  7.6*  --  7.7*  --  7.9* 7.7*  8.7*  MG 1.4*  --   --   --  1.8  --   --  1.8 1.7  PHOS  --   --   --   --  2.6  --   --   --  1.6*   < > = values in this interval not displayed.   Liver Function Tests: Recent Labs  Lab 05/28/23 1829  AST 49*  ALT 40  ALKPHOS 57  BILITOT 1.5*  PROT 6.5  ALBUMIN 3.3*   No results for input(s): "LIPASE", "AMYLASE" in the last 168 hours. Recent Labs  Lab 05/28/23 1820  AMMONIA 19   Cardiac Enzymes: Recent Labs  Lab 05/28/23 1829  CKTOTAL 150   BNP (last 3 results) Recent Labs    05/28/23 1820  BNP 89.3    ProBNP (last 3 results) No results for input(s): "PROBNP" in the last 8760 hours.  CBG: Recent Labs  Lab 05/31/23 1554 05/31/23 2010 06/01/23 0005 06/01/23 0403 06/01/23 0755  GLUCAP 225* 222* 173* 182* 143*   Recent Results (from the past 240 hours)  Blood Culture (routine x 2)     Status: Abnormal   Collection Time: 05/28/23  6:17 PM   Specimen: BLOOD  Result Value Ref Range Status   Specimen Description BLOOD SITE NOT SPECIFIED  Final   Special Requests   Final    BOTTLES DRAWN AEROBIC AND ANAEROBIC Blood Culture results may not be optimal due to an inadequate volume of blood received in culture bottles   Culture  Setup Time   Final    GRAM NEGATIVE RODS IN BOTH AEROBIC AND ANAEROBIC BOTTLES CRITICAL RESULT CALLED TO, READ BACK BY AND VERIFIED WITH: PHARMD BAILEY A  [phone] [id number] FCP Performed at [facility], [address], [place], [place] [address]    Culture ESCHERICHIA COLI (A)  Final   Report Status 05/31/2023 FINAL  Final   Organism ID, Bacteria ESCHERICHIA COLI  Final   Organism ID, Bacteria ESCHERICHIA COLI  Final      Susceptibility   Escherichia coli - KIRBY BAUER*    CEFAZOLIN SENSITIVE Sensitive    Escherichia coli - MIC*    AMPICILLIN <=2 SENSITIVE Sensitive     CEFEPIME <=0.12 SENSITIVE Sensitive     CEFTAZIDIME <=1 SENSITIVE Sensitive     CEFTRIAXONE <=0.25 SENSITIVE Sensitive     CIPROFLOXACIN <=0.25 SENSITIVE Sensitive      GENTAMICIN <=1 SENSITIVE Sensitive     IMIPENEM <=0.25 SENSITIVE Sensitive     TRIMETH/SULFA <=20 SENSITIVE Sensitive     AMPICILLIN/SULBACTAM <=2 SENSITIVE Sensitive     PIP/TAZO <=4 SENSITIVE Sensitive ug/mL    * ESCHERICHIA COLI    ESCHERICHIA COLI  Blood Culture ID Panel (Reflexed)     Status: Abnormal   Collection Time: 05/28/23  6:17 PM  Result Value Ref Range Status   Enterococcus faecalis NOT DETECTED NOT DETECTED Final   Enterococcus Faecium NOT DETECTED NOT DETECTED Final   Listeria monocytogenes NOT DETECTED NOT DETECTED Final   Staphylococcus species NOT DETECTED NOT DETECTED Final   Staphylococcus aureus (BCID) NOT DETECTED NOT DETECTED Final   Staphylococcus epidermidis NOT DETECTED NOT DETECTED Final   Staphylococcus lugdunensis NOT DETECTED NOT DETECTED Final   Streptococcus species NOT DETECTED NOT DETECTED Final   Streptococcus agalactiae NOT DETECTED NOT DETECTED Final   Streptococcus pneumoniae NOT DETECTED NOT DETECTED Final   Streptococcus pyogenes NOT DETECTED NOT DETECTED Final   A.calcoaceticus-baumannii NOT DETECTED NOT DETECTED Final   Bacteroides fragilis NOT DETECTED NOT DETECTED Final   Enterobacterales DETECTED (A) NOT DETECTED Final    Comment: Enterobacterales represent a large order of gram negative bacteria, not a single organism. CRITICAL RESULT CALLED TO, READ BACK BY AND VERIFIED WITH: PHARMD BAILEY A [phone] FCP    Enterobacter cloacae complex NOT DETECTED NOT DETECTED Final   Escherichia coli DETECTED (A) NOT DETECTED Final    Comment: CRITICAL RESULT CALLED TO, READ BACK BY AND VERIFIED WITH: PHARMD BAILEY A [phone] FCP    Klebsiella aerogenes NOT DETECTED NOT DETECTED Final   Klebsiella oxytoca NOT DETECTED NOT DETECTED Final   Klebsiella pneumoniae NOT DETECTED NOT DETECTED Final   Proteus species NOT DETECTED NOT DETECTED Final   Salmonella species NOT DETECTED NOT DETECTED Final   Serratia marcescens NOT DETECTED NOT  DETECTED Final   Haemophilus influenzae NOT DETECTED NOT DETECTED Final   Neisseria meningitidis NOT DETECTED NOT DETECTED Final   Pseudomonas aeruginosa NOT DETECTED NOT DETECTED Final   Stenotrophomonas maltophilia NOT DETECTED NOT DETECTED Final   Candida albicans NOT DETECTED NOT DETECTED Final   Candida auris NOT DETECTED NOT DETECTED Final   Candida glabrata NOT DETECTED NOT DETECTED Final   Candida krusei NOT DETECTED NOT DETECTED Final   Candida parapsilosis NOT DETECTED NOT DETECTED Final   Candida tropicalis NOT DETECTED NOT DETECTED Final   Cryptococcus neoformans/gattii NOT DETECTED NOT DETECTED Final   CTX-M ESBL NOT DETECTED NOT DETECTED Final   Carbapenem resistance IMP NOT DETECTED NOT DETECTED Final   Carbapenem resistance KPC NOT DETECTED NOT DETECTED Final   Carbapenem resistance NDM NOT DETECTED NOT DETECTED Final   Carbapenem resist OXA 48 LIKE NOT DETECTED NOT DETECTED Final   Carbapenem resistance  VIM NOT DETECTED NOT DETECTED Final    Comment: Performed at [facility], [address], [place], [place] [address]  Resp panel by RT-PCR (RSV, Flu A&B, Covid) Anterior Nasal Swab     Status: None   Collection Time: 05/28/23  6:18 PM   Specimen: Anterior Nasal Swab  Result Value Ref Range Status   SARS Coronavirus 2 by RT PCR NEGATIVE NEGATIVE Final   Influenza A by PCR NEGATIVE NEGATIVE Final   Influenza B by PCR NEGATIVE NEGATIVE Final    Comment: (NOTE) The Xpert Xpress SARS-CoV-2/FLU/RSV plus assay is intended as an aid in the diagnosis of influenza from Nasopharyngeal swab specimens and should not be used as a sole basis for treatment. Nasal washings and aspirates are unacceptable for Xpert Xpress SARS-CoV-2/FLU/RSV testing.  Fact Sheet for Patients: BloggerCourse.com  Fact Sheet for Healthcare Providers: SeriousBroker.it  This test is not yet approved or cleared by the [place] FDA and has been  authorized for detection and/or diagnosis of SARS-CoV-2 by FDA under an Emergency Use Authorization (EUA). This EUA will remain in effect (meaning this test can be used) for the duration of the COVID-19 declaration under Section 564(b)(1) of the Act, 21 U.S.C. section 360bbb-3(b)(1), unless the authorization is terminated or revoked.     Resp Syncytial Virus by PCR NEGATIVE NEGATIVE Final    Comment: (NOTE) Fact Sheet for Patients: BloggerCourse.com  Fact Sheet for Healthcare Providers: SeriousBroker.it  This test is not yet approved or cleared by the [place] FDA and has been authorized for detection and/or diagnosis of SARS-CoV-2 by FDA under an Emergency Use Authorization (EUA). This EUA will remain in effect (meaning this test can be used) for the duration of the COVID-19 declaration under Section 564(b)(1) of the Act, 21 U.S.C. section 360bbb-3(b)(1), unless the authorization is terminated or revoked.  Performed at [facility], [address] [place], [place] [address]   Blood Culture (routine x 2)     Status: Abnormal   Collection Time: 05/28/23  7:00 PM   Specimen: BLOOD  Result Value Ref Range Status   Specimen Description BLOOD SITE NOT SPECIFIED  Final   Special Requests   Final    BOTTLES DRAWN AEROBIC AND ANAEROBIC Blood Culture results may not be optimal due to an inadequate volume of blood received in culture bottles   Culture  Setup Time   Final    GRAM NEGATIVE RODS IN BOTH AEROBIC AND ANAEROBIC BOTTLES CRITICAL VALUE NOTED.  VALUE IS CONSISTENT WITH PREVIOUSLY REPORTED AND CALLED VALUE.    Culture (A)  Final    ESCHERICHIA COLI SUSCEPTIBILITIES PERFORMED ON PREVIOUS CULTURE WITHIN THE LAST 5 DAYS. Performed at [facility], [address], [place], [place] [address]    Report Status 05/31/2023 FINAL  Final  Urine Culture     Status: Abnormal   Collection Time: 05/28/23 10:00 PM    Specimen: Urine, Cystoscope  Result Value Ref Range Status   Specimen Description CYSTOSCOPY  Final   Special Requests   Final    NONE Performed at [facility], [address], [place], [place] [address]    Culture >=100,000 COLONIES/mL ESCHERICHIA COLI (A)  Final   Report Status 05/30/2023 FINAL  Final   Organism ID, Bacteria ESCHERICHIA COLI (A)  Final      Susceptibility   Escherichia coli - MIC*    AMPICILLIN 4 SENSITIVE Sensitive     CEFEPIME <=0.12 SENSITIVE Sensitive  CEFTAZIDIME <=1 SENSITIVE Sensitive     CEFTRIAXONE <=0.25 SENSITIVE Sensitive     CIPROFLOXACIN <=0.25 SENSITIVE Sensitive     GENTAMICIN <=1 SENSITIVE Sensitive     IMIPENEM <=0.25 SENSITIVE Sensitive     TRIMETH/SULFA <=20 SENSITIVE Sensitive     AMPICILLIN/SULBACTAM <=2 SENSITIVE Sensitive     PIP/TAZO <=4 SENSITIVE Sensitive ug/mL    * >=100,000 COLONIES/mL ESCHERICHIA COLI  MRSA Next Gen by PCR, Nasal     Status: None   Collection Time: 05/28/23 10:04 PM   Specimen: Nasal Mucosa; Nasal Swab  Result Value Ref Range Status   MRSA by PCR Next Gen NOT DETECTED NOT DETECTED Final    Comment: (NOTE) The GeneXpert MRSA Assay (FDA approved for NASAL specimens only), is one component of a comprehensive MRSA colonization surveillance program. It is not intended to diagnose MRSA infection nor to guide or monitor treatment for MRSA infections. Test performance is not FDA approved in patients less than 58 years old. Performed at [facility], [address], [place], [place] [address]      Studies: No results found.    Joycelyn Das, MD  [facility] 06/01/2023  If 7PM-7AM, please contact night-coverage

## 2023-06-01 NOTE — Plan of Care (Signed)
  Problem: Clinical Measurements: Goal: Diagnostic test results will improve Outcome: Progressing Goal: Respiratory complications will improve Outcome: Progressing Goal: Cardiovascular complication will be avoided Outcome: Progressing   Problem: Activity: Goal: Risk for activity intolerance will decrease Outcome: Progressing   Problem: Nutrition: Goal: Adequate nutrition will be maintained Outcome: Progressing   Problem: Coping: Goal: Level of anxiety will decrease Outcome: Progressing   Problem: Elimination: Goal: Will not experience complications related to bowel motility Outcome: Progressing Goal: Will not experience complications related to urinary retention Outcome: Progressing   Problem: Pain Managment: Goal: General experience of comfort will improve and/or be controlled Outcome: Progressing

## 2023-06-01 NOTE — Progress Notes (Signed)
4 Days Post-Op Subjective: Doing well, awake and interactive, catheter out and urinating. No N/V no pain. No recent fevers   Objective: Vital signs in last 24 hours: Temp:  [98.6 F (37 C)-99.9 F (37.7 C)] 99.6 F (37.6 C) (01/24 1133) Pulse Rate:  [81-105] 101 (01/24 1400) Resp:  [19-31] 21 (01/24 1400) BP: (106-150)/(46-73) 133/63 (01/24 1300) SpO2:  [93 %-98 %] 96 % (01/24 1400) Weight:  [79.5 kg] 79.5 kg (01/24 0500)  Intake/Output from previous day: 01/23 0701 - 01/24 0700 In: 691 [P.O.:480; IV Piggyback:211] Out: 200 [Urine:200] Intake/Output this shift: Total I/O In: 287.1 [P.O.:237; IV Piggyback:50.1] Out: -   Physical Exam:  General: Alert and oriented CV: RRR Lungs: normal WOB  GU: no catheter in place   Lab Results: Recent Labs    05/30/23 0429 05/31/23 0400 06/01/23 0437  HGB 11.8* 11.2* 11.8*  HCT 34.2* 33.6* 34.8*   BMET Recent Labs    05/31/23 0400 06/01/23 0437  NA 133* 138  K 3.3* 3.7  CL 99 105  CO2 23 24  GLUCOSE 238* 174*  BUN 21 22  CREATININE 1.38* 1.18*  CALCIUM 7.7* 8.7*     Studies/Results: No results found.  Assessment/Plan: #sepsis #emphysematous pyelitis  #left ureteral stone   To the OR with on 05/28/23 for urgent ureteral stent placement.  Off pressors  No fevers in plast 24 hours . Definitive stone mgmt on an outpt basis.  Ucx from renal pelvis aspirate growing E Coli. On CTX orders to transition to Ancef starting 06/02/23 AKI resolving  Catheter out patient urinating  Urology will set up f/u as outpatient then plan for URS w/ LL in the future    LOS: 4 days   Sherle Poe MD 06/01/2023, 3:43 PM [facility]

## 2023-06-01 NOTE — Progress Notes (Signed)
Report called to [facility] rn. Bed labeled as clean, but RN says room needs to be zapped by EVS.

## 2023-06-01 NOTE — Evaluation (Signed)
Occupational Therapy Evaluation Patient Details Name: Deanna Shea MRN: [id number] DOB: Sep 05, 1945 Today's Date: 06/01/2023   History of Present Illness 78 year old woman who presented to [facility] ED 1/20 for AMS and lethargy.  Found to have ?DKA, urosepsis.ObstructedL UVJ calculus with obstructive uropathy. Uro consulted. Taken to OR  05/28/23 for L ureteral stent. PMHx significant for Afib (on ASA/digoxin/propranolol), mitral valve prolapse, HLD, T2DM, nephrolithiasis, hypothyroidism, anxiety/depression, schizophrenia.   Clinical Impression   Prior to this admission, patient living at an ALF, using a rollator for mobility, but independent in ADL managment. Patient lethargic throughout evaluation, therefore unsure of accuracy of level of independence. Patient saturated in urine and unaware upon OT arrival, and lethargic (BP 139/64). Patient with need for max A for bed mobility, and max A face to face transfer to stand at EOB. Posterior lean noted. Patient also with noted edema in all extremities. OT will continue to assess vision and cognition as patient becomes more lucid. Given current level, OT recommending rehab at a lesser intensive venue < 3 hours in order to return to prior level of function.       If plan is discharge home, recommend the following: Two people to help with walking and/or transfers;A lot of help with bathing/dressing/bathroom;Assistance with cooking/housework;Assistance with feeding;Assist for transportation;Help with stairs or ramp for entrance;Supervision due to cognitive status;Direct supervision/assist for financial management;Direct supervision/assist for medications management    Functional Status Assessment  Patient has had a recent decline in their functional status and demonstrates the ability to make significant improvements in function in a reasonable and predictable amount of time.  Equipment Recommendations  Other (comment) (defer to next venue)     Recommendations for Other Services       Precautions / Restrictions Precautions Precautions: Fall Precaution Comments: slowed responses, but will participate Restrictions Weight Bearing Restrictions Per Provider Order: No      Mobility Bed Mobility Overal bed mobility: Needs Assistance Bed Mobility: Supine to Sit, Sit to Supine     Supine to sit: Max assist Sit to supine: Total assist, +2 for physical assistance, +2 for safety/equipment   General bed mobility comments: Max A to come to EOB, assist provided for BLEs and heavy assist with trunk. Posterior and left lateral lean noted. Patient total A of 2 to return to bed, increased fatigue noted    Transfers Overall transfer level: Needs assistance Equipment used: 1 person hand held assist Transfers: Sit to/from Stand Sit to Stand: Max assist           General transfer comment: Face to face transfer completed in order to come into standing, heavy posterior lean, OT blocking knees with minimal buckling noted, able to follow cues for upright posture      Balance Overall balance assessment: Needs assistance Sitting-balance support: Feet supported, Bilateral upper extremity supported Sitting balance-Leahy Scale: Poor Sitting balance - Comments: heavy posterior and left lateral lean   Standing balance support: Bilateral upper extremity supported Standing balance-Leahy Scale: Poor Standing balance comment: Heavy Max A to stand to date with posterior lean                           ADL either performed or assessed with clinical judgement   ADL Overall ADL's : Needs assistance/impaired Eating/Feeding: Maximal assistance;Sitting   Grooming: Maximal assistance;Sitting   Upper Body Bathing: Maximal assistance;Bed level   Lower Body Bathing: Total assistance;Sitting/lateral leans;Sit to/from stand   Upper Body Dressing : Maximal assistance;Sitting  Upper Body Dressing Details (indicate cue type and reason):  donning and doffing gown, max cues to sequence Lower Body Dressing: Total assistance;Bed level   Toilet Transfer: +2 for safety/equipment;+2 for physical assistance;Maximal assistance;Squat-pivot Toilet Transfer Details (indicate cue type and reason): simulated with sit<>stand at EOB, decreased initiation Toileting- Clothing Manipulation and Hygiene: Total assistance;+2 for physical assistance;+2 for safety/equipment Toileting - Clothing Manipulation Details (indicate cue type and reason): patient unaware of heavily saturated bed     Functional mobility during ADLs: Maximal assistance;+2 for physical assistance;+2 for safety/equipment;Cueing for safety;Cueing for sequencing General ADL Comments: Prior to this admission, patient living at  an ALF, using a rollator for mobility, but independent in ADL managment. Patient lethargic throughout evaluation, therefore unsure of accuracy of level of independence. Patient saturated in urine and unaware upon OT arrival, and lethargic (BP 139/64). Patient with need for max A for bed mobility, and max A face to face transfer to stand at EOB. Posterior lean noted. Patient also with noted edema in all extremities. OT will continue to assess vision and cognition as patient becomes more lucid. Given current level, OT recommending rehab at a lesser intensive venue < 3 hours in order to return to prior level of function.     Vision Baseline Vision/History: 1 Wears glasses Ability to See in Adequate Light: 0 Adequate Patient Visual Report: No change from baseline Vision Assessment?: Vision impaired- to be further tested in functional context Additional Comments: Patient too lethargic to attempt visual assessment     Perception Perception: Not tested       Praxis Praxis: Not tested       Pertinent Vitals/Pain Pain Assessment Pain Assessment: Faces Faces Pain Scale: No hurt Pain Intervention(s): Limited activity within patient's tolerance, Monitored during  session, Repositioned     Extremity/Trunk Assessment Upper Extremity Assessment Upper Extremity Assessment: Generalized weakness;Difficult to assess due to impaired cognition   Lower Extremity Assessment Lower Extremity Assessment: Defer to PT evaluation   Cervical / Trunk Assessment Cervical / Trunk Assessment: Kyphotic (minimally)   Communication Communication Communication: No apparent difficulties Cueing Techniques: Verbal cues;Gestural cues;Tactile cues;Visual cues   Cognition Arousal: Lethargic Behavior During Therapy: Flat affect Overall Cognitive Status: Impaired/Different from baseline Area of Impairment: Orientation, Following commands, Safety/judgement, Problem solving, Attention, Awareness                 Orientation Level: Disoriented to, Time, Situation Current Attention Level: Sustained   Following Commands: Follows one step commands inconsistently, Follows one step commands with increased time Safety/Judgement: Decreased awareness of safety, Decreased awareness of deficits Awareness: Emergent Problem Solving: Slow processing, Decreased initiation, Difficulty sequencing, Requires verbal cues, Requires tactile cues General Comments: More lethargic for OT evaluation, often with eyes closed and increased effort to maintain alert state. Patient with minimal utterances, and saturated in urine (appeared to be unaware) Patient would initiate movements when prompted but required frequent cues in order to maintain     General Comments  BP 139/64 in supine, unable to get BPs in sitting and standing due to increased assist required    Exercises     Shoulder Instructions      Home Living Family/patient expects to be discharged to:: Assisted living ("[facility]")                             Home Equipment: Rollator (4 wheels)   Additional Comments: Limited hx provided due to impaired cognition  Prior Functioning/Environment Prior Level of  Function : History of Falls (last six months);Patient poor historian/Family not available (Fall in August of last year resulting in admission)             Mobility Comments: States she walks with a rollator ADLs Comments: Reports Ind (questionable historian due to AMS) Walks to community room for meals that are prepared for residents at ALF (Per PT eval, patient more lethargic for OT eval)        OT Problem List: Decreased activity tolerance;Decreased range of motion;Decreased strength;Impaired balance (sitting and/or standing);Decreased coordination;Decreased cognition;Decreased safety awareness;Decreased knowledge of use of DME or AE;Decreased knowledge of precautions;Obesity;Increased edema      OT Treatment/Interventions: Self-care/ADL training;Therapeutic exercise;Energy conservation;DME and/or AE instruction;Manual therapy;Therapeutic activities;Cognitive remediation/compensation;Patient/family education;Balance training    OT Goals(Current goals can be found in the care plan section) Acute Rehab OT Goals Patient Stated Goal: unable OT Goal Formulation: Patient unable to participate in goal setting Time For Goal Achievement: 06/15/23 Potential to Achieve Goals: Fair  OT Frequency: Min 1X/week    Co-evaluation              AM-PAC OT "6 Clicks" Daily Activity     Outcome Measure Help from another person eating meals?: A Lot Help from another person taking care of personal grooming?: A Lot Help from another person toileting, which includes using toliet, bedpan, or urinal?: A Lot Help from another person bathing (including washing, rinsing, drying)?: A Lot Help from another person to put on and taking off regular upper body clothing?: A Lot Help from another person to put on and taking off regular lower body clothing?: Total 6 Click Score: 11   End of Session Equipment Utilized During Treatment: Gait belt Nurse Communication: Mobility status  Activity Tolerance: Patient  limited by fatigue;Patient limited by lethargy Patient left: in bed;with call bell/phone within reach;with nursing/sitter in room  OT Visit Diagnosis: Unsteadiness on feet (R26.81);Other abnormalities of gait and mobility (R26.89);Muscle weakness (generalized) (M62.81);Other symptoms and signs involving cognitive function                Time: 1610-9604 OT Time Calculation (min): 25 min Charges:  OT General Charges $OT Visit: 1 Visit OT Evaluation $OT Eval Moderate Complexity: 1 Mod OT Treatments $Self Care/Home Management : 8-22 mins  Pollyann Glen E. Alaa Mullally, OTR/L Acute Rehabilitation Services [phone]   Cherlyn Cushing 06/01/2023, 3:20 PM

## 2023-06-01 NOTE — Progress Notes (Signed)
OT Cancellation Note  Patient Details Name: Elonna Mcfarlane MRN: [id number] DOB: 08-Sep-1945   Cancelled Treatment:    Reason Eval/Treat Not Completed: Other (comment) Patient sleeping this AM, per RN has been restless and flat when alert, and requesting OT let patient sleep and return later. OT will follow back to complete evaluation at later time.   Pollyann Glen E. Jenasis Straley, OTR/L Acute Rehabilitation Services [phone]   Cherlyn Cushing 06/01/2023, 9:45 AM

## 2023-06-02 ENCOUNTER — Inpatient Hospital Stay (HOSPITAL_COMMUNITY): Payer: Medicare HMO

## 2023-06-02 DIAGNOSIS — N390 Urinary tract infection, site not specified: Secondary | ICD-10-CM | POA: Diagnosis not present

## 2023-06-02 DIAGNOSIS — A419 Sepsis, unspecified organism: Secondary | ICD-10-CM | POA: Diagnosis not present

## 2023-06-02 LAB — COMPREHENSIVE METABOLIC PANEL
ALT: 30 U/L (ref 0–44)
AST: 42 U/L — ABNORMAL HIGH (ref 15–41)
Albumin: 1.7 g/dL — ABNORMAL LOW (ref 3.5–5.0)
Alkaline Phosphatase: 133 U/L — ABNORMAL HIGH (ref 38–126)
Anion gap: 9 (ref 5–15)
BUN: 22 mg/dL (ref 8–23)
CO2: 23 mmol/L (ref 22–32)
Calcium: 7.9 mg/dL — ABNORMAL LOW (ref 8.9–10.3)
Chloride: 106 mmol/L (ref 98–111)
Creatinine, Ser: 1.15 mg/dL — ABNORMAL HIGH (ref 0.44–1.00)
GFR, Estimated: 50 mL/min — ABNORMAL LOW (ref >60)
Glucose, Bld: 112 mg/dL — ABNORMAL HIGH (ref 70–99)
Potassium: 3.5 mmol/L (ref 3.5–5.1)
Sodium: 138 mmol/L (ref 135–145)
Total Bilirubin: 0.8 mg/dL (ref 0.0–1.2)
Total Protein: 4.7 g/dL — ABNORMAL LOW (ref 6.5–8.1)

## 2023-06-02 LAB — URINALYSIS, COMPLETE (UACMP) WITH MICROSCOPIC
Bacteria, UA: NONE SEEN
Bilirubin Urine: NEGATIVE
Glucose, UA: 50 mg/dL — AB
Ketones, ur: 5 mg/dL — AB
Leukocytes,Ua: NEGATIVE
Nitrite: NEGATIVE
Protein, ur: 30 mg/dL — AB
Specific Gravity, Urine: 1.012 (ref 1.005–1.030)
pH: 7 (ref 5.0–8.0)

## 2023-06-02 LAB — CBC
HCT: 34 % — ABNORMAL LOW (ref 36.0–46.0)
Hemoglobin: 11.4 g/dL — ABNORMAL LOW (ref 12.0–15.0)
MCH: 29.5 pg (ref 26.0–34.0)
MCHC: 33.5 g/dL (ref 30.0–36.0)
MCV: 87.9 fL (ref 80.0–100.0)
Platelets: 102 10*3/uL — ABNORMAL LOW (ref 150–400)
RBC: 3.87 MIL/uL (ref 3.87–5.11)
RDW: 13.5 % (ref 11.5–15.5)
WBC: 7.6 10*3/uL (ref 4.0–10.5)
nRBC: 0 % (ref 0.0–0.2)

## 2023-06-02 LAB — GLUCOSE, CAPILLARY
Glucose-Capillary: 104 mg/dL — ABNORMAL HIGH (ref 70–99)
Glucose-Capillary: 117 mg/dL — ABNORMAL HIGH (ref 70–99)
Glucose-Capillary: 135 mg/dL — ABNORMAL HIGH (ref 70–99)
Glucose-Capillary: 136 mg/dL — ABNORMAL HIGH (ref 70–99)
Glucose-Capillary: 140 mg/dL — ABNORMAL HIGH (ref 70–99)
Glucose-Capillary: 98 mg/dL (ref 70–99)

## 2023-06-02 LAB — MAGNESIUM: Magnesium: 1.9 mg/dL (ref 1.7–2.4)

## 2023-06-02 MED ORDER — INSULIN ASPART 100 UNIT/ML IJ SOLN
0.0000 [IU] | Freq: Every day | INTRAMUSCULAR | Status: DC
  Administered 2023-06-03: 2 [IU] via SUBCUTANEOUS

## 2023-06-02 MED ORDER — ADULT MULTIVITAMIN W/MINERALS CH
1.0000 | ORAL_TABLET | Freq: Every day | ORAL | Status: DC
  Administered 2023-06-03 – 2023-06-11 (×9): 1 via ORAL
  Filled 2023-06-02 (×9): qty 1

## 2023-06-02 MED ORDER — ACETAMINOPHEN 325 MG PO TABS
650.0000 mg | ORAL_TABLET | Freq: Four times a day (QID) | ORAL | Status: DC | PRN
  Administered 2023-06-02 – 2023-06-10 (×5): 650 mg via ORAL
  Filled 2023-06-02 (×5): qty 2

## 2023-06-02 MED ORDER — INSULIN ASPART 100 UNIT/ML IJ SOLN
0.0000 [IU] | Freq: Three times a day (TID) | INTRAMUSCULAR | Status: DC
  Administered 2023-06-02 – 2023-06-03 (×5): 2 [IU] via SUBCUTANEOUS
  Administered 2023-06-04 (×2): 5 [IU] via SUBCUTANEOUS
  Administered 2023-06-04 – 2023-06-05 (×2): 3 [IU] via SUBCUTANEOUS
  Administered 2023-06-05: 2 [IU] via SUBCUTANEOUS
  Administered 2023-06-06: 3 [IU] via SUBCUTANEOUS
  Administered 2023-06-06: 2 [IU] via SUBCUTANEOUS
  Administered 2023-06-06 – 2023-06-08 (×3): 3 [IU] via SUBCUTANEOUS
  Administered 2023-06-08 – 2023-06-09 (×3): 2 [IU] via SUBCUTANEOUS
  Administered 2023-06-10 – 2023-06-11 (×3): 3 [IU] via SUBCUTANEOUS
  Administered 2023-06-11: 2 [IU] via SUBCUTANEOUS

## 2023-06-02 NOTE — Progress Notes (Signed)
TRH night cross cover note:   I was notified by RN of temp of 101.8. HR in 70's, bp 106/90. RR 16. O2 sat 100% on RA.   Patient here with sepsis d/t uti complicated ecoli bacteremia. Has continue to spike fevers. Abx recently switched to cefazolin.   As this appears to be a 24 hour tmax, will checked updated cxr, ua, blood cx's at this time.   Tylenol administered.    Update: Fever has improved, with most recent temperature 98.2.  Systolic blood pressures in the high 90s.  Will order small IV fluid bolus (500 cc ns) at this time.     Update: interval improvement in systolic blood pressure with 500 cc NS bolus, with systolic blood pressures now greater than 100.  I have subsequently ordered NS at 75 cc/h x 8 hours. Patient started on 2 L [place], which is new. Will continue to monitor for evidence of acute volume overload.  I have ordered BNP with morning labs.    Newton Pigg, DO Hospitalist

## 2023-06-02 NOTE — Progress Notes (Addendum)
PROGRESS NOTE  Deanna Shea UUV:[id number] DOB: 1945-09-18 DOA: 05/28/2023 PCP: Darrow Bussing, MD   LOS: 5 days   Brief narrative:  78 years old female with past medical history of atrial fibrillation, hyperlipidemia, type 2 diabetes, hypothyroidism, anxiety, depression, schizophrenia who initially presented to the hospital from assisted living facility with altered mental status and lethargy.  She was also noted to have hyperglycemia with blood glucose levels in the 500s.  In the ED vitals were stable.  Labs showed hemoglobin of 15.4.  Platelet was low at 121.  Sodium was slightly low at 126 with potassium high at 5.1 with bicarb of 14 and creatinine of 2.4 from baseline 0.61.  Magnesium was low at 1.4 as well.  Troponin was slightly elevated with beta hydroxybutyrate elevation and lactic acid was elevated of 6.5.  Patient was started on broad-spectrum antibiotic for presumed sepsis.  CT head scan was negative for acute findings., CT chest abdomen pelvis showed a 7 mm UVJ calculus with moderate left obstructive hydronephrosis.  There was some gas noted in the bladder and left ureter and collecting system.  Urology was consulted for left ureteral stent placement.  Patient was then admitted to the ICU postoperatively.  At this time patient has been considered stable for transfer out of ICU.  Principal Problem:   Sepsis due to urinary tract infection (HCC) Active Problems:   AKI (acute kidney injury) (HCC)   Acute encephalopathy   Diabetic ketoacidosis without coma associated with type 2 diabetes mellitus (HCC)   Ureterolithiasis   Septic shock (HCC)   Severe sepsis with septic shock due to complicated E. coli urinary tract infection with obstructive uropathy,  E. coli bacteremia Lactic acidosis Patient had obstructive uropathy due to obstructing L UVJ calculus status post stent by urology on 05/28/2023. Temperature max of 100.8 F.  F.  Was on IV Rocephin has been changed to cefazolin from  today since culture is positive for pansensitive E. coli.  .If she continues to spike fever, she will need CT abdomen to rule out renal abscess. Urology following.  CBC from today at 7.6.  Will continue to monitor.   Acute respiratory insufficiency, postprocedure, resolved Patient was successfully extubated  currently on room air.  No active issues.   Diabetic ketoacidosis DKA has resolved.  Continue on insulin regimen.  Currently on Semglee  13 units twice daily, Continue sliding scale insulin.  Overall glycemic control has improved.  Latest POC glucose of 98.   Acute metabolic/septic encephalopathy Secondary to sepsis and DKA.  Following commands.  States that she feels a little sleepy.   AKI, anion gap metabolic acidosis Creatinine today at 1.1.  Initial creatinine of 2.4.  Has improved at this time.  Hypomagnesemia Replenished and improved.  Latest magnesium of 1.9.  Hyponatremia Improved at this time.  Latest sodium of 138  Hypokalemia Improved at this time.  Latest potassium of 3.5.  Hypophosphatemia.  Replenished.  Will check phosphate level in AM.  Paroxysmal A-fib On sinus rhythm at this time.  Continue aspirin for prophylaxis.   Hypothyroidism Continue home Synthroid   Depression/Anxiety/Schizophrenia Continue risperidone, Lexapro as clinically appropriate   Thrombocytopenia due to critical illness Improving platelet count 102 from 82 < 60.  Continue to monitor.   Assessment/Plan: DVT prophylaxis: heparin injection 5,000 Units Start: 05/29/23 0600 SCDs Start: 05/28/23 2155   Disposition: Skilled nursing facility as per PT recommendation likely in 1 to 2 days  Status is: Inpatient Remains inpatient appropriate because: IV antibiotics, septic  shock, need for rehabilitation, IV antibiotic, pending clinical improvement    Code Status:     Code Status: Full Code  Family Communication: None at bedside.  Spoke with the patient's son on the phone and updated him  about the clinical condition of the patient.  Consultants: PCCM Urology  Procedures: Left ureteric stent placement by urology 05/28/2023  Anti-infectives:  Cefazolin IV 06/02/2023>  Anti-infectives (From admission, onward)    Start     Dose/Rate Route Frequency Ordered Stop   06/02/23 0600  ceFAZolin (ANCEF) IVPB 2g/100 mL premix        2 g 200 mL/hr over 30 Minutes Intravenous Every 8 hours 06/01/23 1326 06/08/23 0559   05/31/23 0600  cefTRIAXone (ROCEPHIN) 2 g in sodium chloride 0.9 % 100 mL IVPB  Status:  Discontinued        2 g 200 mL/hr over 30 Minutes Intravenous Every 24 hours 05/30/23 0855 06/01/23 1326   05/30/23 0945  cefTRIAXone (ROCEPHIN) 1 g in sodium chloride 0.9 % 100 mL IVPB        1 g 200 mL/hr over 30 Minutes Intravenous  Once 05/30/23 0856 05/30/23 0937   05/29/23 0600  cefTRIAXone (ROCEPHIN) 1 g in sodium chloride 0.9 % 100 mL IVPB  Status:  Discontinued        1 g 200 mL/hr over 30 Minutes Intravenous Every 24 hours 05/28/23 2204 05/30/23 0855   05/28/23 2127  vancomycin (VANCOCIN) 1-5 GM/200ML-% IVPB       Note to Pharmacy: Brien Mates D: cabinet override      05/28/23 2127 05/29/23 0929   05/28/23 2000  vancomycin (VANCOREADY) IVPB 1500 mg/300 mL  Status:  Discontinued        1,500 mg 150 mL/hr over 120 Minutes Intravenous  Once 05/28/23 1957 05/28/23 2203   05/28/23 1900  Vancomycin (VANCOCIN) 1,500 mg in sodium chloride 0.9 % 500 mL IVPB  Status:  Discontinued        1,500 mg 250 mL/hr over 120 Minutes Intravenous  Once 05/28/23 1853 05/28/23 1958   05/28/23 1845  ceFEPIme (MAXIPIME) 2 g in sodium chloride 0.9 % 100 mL IVPB        2 g 200 mL/hr over 30 Minutes Intravenous  Once 05/28/23 1843 05/28/23 1935   05/28/23 1845  metroNIDAZOLE (FLAGYL) IVPB 500 mg        500 mg 100 mL/hr over 60 Minutes Intravenous  Once 05/28/23 1843 05/28/23 2048   05/28/23 1845  vancomycin (VANCOCIN) IVPB 1000 mg/200 mL premix  Status:  Discontinued        1,000  mg 200 mL/hr over 60 Minutes Intravenous  Once 05/28/23 1843 05/28/23 1853       Subjective: Today, patient was seen and examined at bedside.  Patient states that she feels little sleepy today but denies any nausea vomiting chills or rigor.  Temperature max noted at 100 F.  Denies any pain, shortness of breath or dyspnea.  Objective: Vitals:   06/02/23 0503 06/02/23 0807  BP: (!) 144/60 125/63  Pulse: 81 79  Resp: 18 12  Temp: 98.7 F (37.1 C) 99.9 F (37.7 C)  SpO2: 93% 94%    Intake/Output Summary (Last 24 hours) at 06/02/2023 1205 Last data filed at 06/02/2023 1030 Gross per 24 hour  Intake 757 ml  Output 100 ml  Net 657 ml   Filed Weights   05/29/23 2100 05/30/23 0442 06/01/23 0500  Weight: 77.5 kg 77.5 kg 79.5 kg   Body  mass index is 27.45 kg/m.   Physical Exam:  GENERAL: Patient is alert awake Not in obvious distress.  Oriented.  Communicative.  Elderly female, HENT: No scleral pallor or icterus. Pupils equally reactive to light. Oral mucosa is moist NECK: is supple, no gross swelling noted. CHEST: Clear to auscultation. No crackles or wheezes.  CVS: S1 and S2 heard, no murmur. Regular rate and rhythm.  ABDOMEN: Soft, non-tender, bowel sounds are present. EXTREMITIES: No edema. CNS: Cranial nerves are intact.  Moves all extremities.  Generalized weakness noted. SKIN: warm and dry without rashes.  Data Review: I have personally reviewed the following laboratory data and studies,  CBC: Recent Labs  Lab 05/28/23 1829 05/28/23 1836 05/29/23 0429 05/29/23 0859 05/30/23 0429 05/31/23 0400 06/01/23 0437 06/02/23 0417  WBC 6.7   < > 8.1  --  7.4 5.5 6.1 7.6  NEUTROABS 5.4  --   --   --   --   --   --   --   HGB 15.4*   < > 12.9 12.6 11.8* 11.2* 11.8* 11.4*  HCT 46.7*   < > 37.3 37.0 34.2* 33.6* 34.8* 34.0*  MCV 88.8   < > 86.3  --  87.5 88.0 87.2 87.9  PLT 121*   < > 90*  --  55* 60* 82* 102*   < > = values in this interval not displayed.   Basic  Metabolic Panel: Recent Labs  Lab 05/28/23 1829 05/28/23 1836 05/29/23 0429 05/29/23 0859 05/30/23 0429 05/31/23 0400 06/01/23 0437 06/02/23 0417  NA 126*   < > 130* 135 136 133* 138 138  K 5.1   < > 4.0 3.5 3.5 3.3* 3.7 3.5  CL 92*   < > 101  --  107 99 105 106  CO2 14*   < > 18*  --  24 23 24 23   GLUCOSE 501*   < > 274*  --  140* 238* 174* 112*  BUN 32*   < > 30*  --  22 21 22 22   CREATININE 2.48*   < > 1.76*  --  1.44* 1.38* 1.18* 1.15*  CALCIUM 8.5*   < > 7.7*  --  7.9* 7.7* 8.7* 7.9*  MG 1.4*  --  1.8  --   --  1.8 1.7 1.9  PHOS  --   --  2.6  --   --   --  1.6*  --    < > = values in this interval not displayed.   Liver Function Tests: Recent Labs  Lab 05/28/23 1829 06/02/23 0417  AST 49* 42*  ALT 40 30  ALKPHOS 57 133*  BILITOT 1.5* 0.8  PROT 6.5 4.7*  ALBUMIN 3.3* 1.7*   No results for input(s): "LIPASE", "AMYLASE" in the last 168 hours. Recent Labs  Lab 05/28/23 1820  AMMONIA 19   Cardiac Enzymes: Recent Labs  Lab 05/28/23 1829  CKTOTAL 150   BNP (last 3 results) Recent Labs    05/28/23 1820  BNP 89.3    ProBNP (last 3 results) No results for input(s): "PROBNP" in the last 8760 hours.  CBG: Recent Labs  Lab 06/01/23 1932 06/01/23 2109 06/02/23 0001 06/02/23 0501 06/02/23 0807  GLUCAP 161* 123* 104* 117* 98   Recent Results (from the past 240 hours)  Blood Culture (routine x 2)     Status: Abnormal   Collection Time: 05/28/23  6:17 PM   Specimen: BLOOD  Result Value Ref Range Status   Specimen  Description BLOOD SITE NOT SPECIFIED  Final   Special Requests   Final    BOTTLES DRAWN AEROBIC AND ANAEROBIC Blood Culture results may not be optimal due to an inadequate volume of blood received in culture bottles   Culture  Setup Time   Final    GRAM NEGATIVE RODS IN BOTH AEROBIC AND ANAEROBIC BOTTLES CRITICAL RESULT CALLED TO, READ BACK BY AND VERIFIED WITH: Gwendolyn Fill A [id number] FCP Performed at [facility], [address], [place], [place] [address]    Culture ESCHERICHIA COLI (A)  Final   Report Status 05/31/2023 FINAL  Final   Organism ID, Bacteria ESCHERICHIA COLI  Final   Organism ID, Bacteria ESCHERICHIA COLI  Final      Susceptibility   Escherichia coli - KIRBY BAUER*    CEFAZOLIN SENSITIVE Sensitive    Escherichia coli - MIC*    AMPICILLIN <=2 SENSITIVE Sensitive     CEFEPIME <=0.12 SENSITIVE Sensitive     CEFTAZIDIME <=1 SENSITIVE Sensitive     CEFTRIAXONE <=0.25 SENSITIVE Sensitive     CIPROFLOXACIN <=0.25 SENSITIVE Sensitive     GENTAMICIN <=1 SENSITIVE Sensitive     IMIPENEM <=0.25 SENSITIVE Sensitive     TRIMETH/SULFA <=20 SENSITIVE Sensitive     AMPICILLIN/SULBACTAM <=2 SENSITIVE Sensitive     PIP/TAZO <=4 SENSITIVE Sensitive ug/mL    * ESCHERICHIA COLI    ESCHERICHIA COLI  Blood Culture ID Panel (Reflexed)     Status: Abnormal   Collection Time: 05/28/23  6:17 PM  Result Value Ref Range Status   Enterococcus faecalis NOT DETECTED NOT DETECTED Final   Enterococcus Faecium NOT DETECTED NOT DETECTED Final   Listeria monocytogenes NOT DETECTED NOT DETECTED Final   Staphylococcus species NOT DETECTED NOT DETECTED Final   Staphylococcus aureus (BCID) NOT DETECTED NOT DETECTED Final   Staphylococcus epidermidis NOT DETECTED NOT DETECTED Final   Staphylococcus lugdunensis NOT DETECTED NOT DETECTED Final   Streptococcus species NOT DETECTED NOT DETECTED Final   Streptococcus agalactiae NOT DETECTED NOT DETECTED Final   Streptococcus pneumoniae NOT DETECTED NOT DETECTED Final   Streptococcus pyogenes NOT DETECTED NOT DETECTED Final   A.calcoaceticus-baumannii NOT DETECTED NOT DETECTED Final   Bacteroides fragilis NOT DETECTED NOT DETECTED Final   Enterobacterales DETECTED (A) NOT DETECTED Final    Comment: Enterobacterales represent a large order of gram negative bacteria, not a single organism. CRITICAL RESULT CALLED TO, READ BACK BY AND VERIFIED WITH: PHARMD BAILEY A [id number] FCP     Enterobacter cloacae complex NOT DETECTED NOT DETECTED Final   Escherichia coli DETECTED (A) NOT DETECTED Final    Comment: CRITICAL RESULT CALLED TO, READ BACK BY AND VERIFIED WITH: PHARMD BAILEY A [phone] FCP    Klebsiella aerogenes NOT DETECTED NOT DETECTED Final   Klebsiella oxytoca NOT DETECTED NOT DETECTED Final   Klebsiella pneumoniae NOT DETECTED NOT DETECTED Final   Proteus species NOT DETECTED NOT DETECTED Final   Salmonella species NOT DETECTED NOT DETECTED Final   Serratia marcescens NOT DETECTED NOT DETECTED Final   Haemophilus influenzae NOT DETECTED NOT DETECTED Final   Neisseria meningitidis NOT DETECTED NOT DETECTED Final   Pseudomonas aeruginosa NOT DETECTED NOT DETECTED Final   Stenotrophomonas maltophilia NOT DETECTED NOT DETECTED Final   Candida albicans NOT DETECTED NOT DETECTED Final   Candida auris NOT DETECTED NOT DETECTED Final   Candida glabrata NOT DETECTED NOT DETECTED Final   Candida krusei NOT DETECTED NOT DETECTED Final   Candida parapsilosis NOT DETECTED NOT DETECTED  Final   Candida tropicalis NOT DETECTED NOT DETECTED Final   Cryptococcus neoformans/gattii NOT DETECTED NOT DETECTED Final   CTX-M ESBL NOT DETECTED NOT DETECTED Final   Carbapenem resistance IMP NOT DETECTED NOT DETECTED Final   Carbapenem resistance KPC NOT DETECTED NOT DETECTED Final   Carbapenem resistance NDM NOT DETECTED NOT DETECTED Final   Carbapenem resist OXA 48 LIKE NOT DETECTED NOT DETECTED Final   Carbapenem resistance VIM NOT DETECTED NOT DETECTED Final    Comment: Performed at [facility], [address], [place], [place] [address]  Resp panel by RT-PCR (RSV, Flu A&B, Covid) Anterior Nasal Swab     Status: None   Collection Time: 05/28/23  6:18 PM   Specimen: Anterior Nasal Swab  Result Value Ref Range Status   SARS Coronavirus 2 by RT PCR NEGATIVE NEGATIVE Final   Influenza A by PCR NEGATIVE NEGATIVE Final   Influenza B by PCR NEGATIVE NEGATIVE Final     Comment: (NOTE) The Xpert Xpress SARS-CoV-2/FLU/RSV plus assay is intended as an aid in the diagnosis of influenza from Nasopharyngeal swab specimens and should not be used as a sole basis for treatment. Nasal washings and aspirates are unacceptable for Xpert Xpress SARS-CoV-2/FLU/RSV testing.  Fact Sheet for Patients: BloggerCourse.com  Fact Sheet for Healthcare Providers: SeriousBroker.it  This test is not yet approved or cleared by the [place] FDA and has been authorized for detection and/or diagnosis of SARS-CoV-2 by FDA under an Emergency Use Authorization (EUA). This EUA will remain in effect (meaning this test can be used) for the duration of the COVID-19 declaration under Section 564(b)(1) of the Act, 21 U.S.C. section 360bbb-3(b)(1), unless the authorization is terminated or revoked.     Resp Syncytial Virus by PCR NEGATIVE NEGATIVE Final    Comment: (NOTE) Fact Sheet for Patients: BloggerCourse.com  Fact Sheet for Healthcare Providers: SeriousBroker.it  This test is not yet approved or cleared by the [place] FDA and has been authorized for detection and/or diagnosis of SARS-CoV-2 by FDA under an Emergency Use Authorization (EUA). This EUA will remain in effect (meaning this test can be used) for the duration of the COVID-19 declaration under Section 564(b)(1) of the Act, 21 U.S.C. section 360bbb-3(b)(1), unless the authorization is terminated or revoked.  Performed at [facility], [address] [place], [place] [address]   Blood Culture (routine x 2)     Status: Abnormal   Collection Time: 05/28/23  7:00 PM   Specimen: BLOOD  Result Value Ref Range Status   Specimen Description BLOOD SITE NOT SPECIFIED  Final   Special Requests   Final    BOTTLES DRAWN AEROBIC AND ANAEROBIC Blood Culture results may not be optimal due to an inadequate volume  of blood received in culture bottles   Culture  Setup Time   Final    GRAM NEGATIVE RODS IN BOTH AEROBIC AND ANAEROBIC BOTTLES CRITICAL VALUE NOTED.  VALUE IS CONSISTENT WITH PREVIOUSLY REPORTED AND CALLED VALUE.    Culture (A)  Final    ESCHERICHIA COLI SUSCEPTIBILITIES PERFORMED ON PREVIOUS CULTURE WITHIN THE LAST 5 DAYS. Performed at [facility], [address] [place], [place] [address]    Report Status 05/31/2023 FINAL  Final  Urine Culture     Status: Abnormal   Collection Time: 05/28/23 10:00 PM   Specimen: Urine, Cystoscope  Result Value Ref Range Status   Specimen Description CYSTOSCOPY  Final   Special Requests   Final  NONE Performed at [facility], [address] [place], [place] [address]    Culture >=100,000 COLONIES/mL ESCHERICHIA COLI (A)  Final   Report Status 05/30/2023 FINAL  Final   Organism ID, Bacteria ESCHERICHIA COLI (A)  Final      Susceptibility   Escherichia coli - MIC*    AMPICILLIN 4 SENSITIVE Sensitive     CEFEPIME <=0.12 SENSITIVE Sensitive     CEFTAZIDIME <=1 SENSITIVE Sensitive     CEFTRIAXONE <=0.25 SENSITIVE Sensitive     CIPROFLOXACIN <=0.25 SENSITIVE Sensitive     GENTAMICIN <=1 SENSITIVE Sensitive     IMIPENEM <=0.25 SENSITIVE Sensitive     TRIMETH/SULFA <=20 SENSITIVE Sensitive     AMPICILLIN/SULBACTAM <=2 SENSITIVE Sensitive     PIP/TAZO <=4 SENSITIVE Sensitive ug/mL    * >=100,000 COLONIES/mL ESCHERICHIA COLI  MRSA Next Gen by PCR, Nasal     Status: None   Collection Time: 05/28/23 10:04 PM   Specimen: Nasal Mucosa; Nasal Swab  Result Value Ref Range Status   MRSA by PCR Next Gen NOT DETECTED NOT DETECTED Final    Comment: (NOTE) The GeneXpert MRSA Assay (FDA approved for NASAL specimens only), is one component of a comprehensive MRSA colonization surveillance program. It is not intended to diagnose MRSA infection nor to guide or monitor treatment for MRSA infections. Test performance is not FDA approved in  patients less than 69 years old. Performed at [facility], [address] [place], [place] [address]      Studies: No results found.    Joycelyn Das, MD  [facility] 06/02/2023  If 7PM-7AM, please contact night-coverage

## 2023-06-02 NOTE — Progress Notes (Signed)
T 101.8, admin PRN tylenol. Notified night dr.

## 2023-06-02 NOTE — Evaluation (Signed)
Speech Language Pathology Evaluation Patient Details Name: Deanna Shea MRN: [id number] DOB: 1945/11/03 Today's Date: 06/02/2023 Time: 1735-1750 SLP Time Calculation (min) (ACUTE ONLY): 15 min  Problem List:  Patient Active Problem List   Diagnosis Date Noted   Sepsis due to urinary tract infection (HCC) 05/28/2023   AKI (acute kidney injury) (HCC) 05/28/2023   Acute encephalopathy 05/28/2023   Diabetic ketoacidosis without coma associated with type 2 diabetes mellitus (HCC) 05/28/2023   Ureterolithiasis 05/28/2023   Septic shock (HCC) 05/28/2023   Rhabdomyolysis 01/03/2023   Hypothyroid 07/24/2021   Diet-controlled type 2 diabetes mellitus (HCC) 12/30/2015   SBO (small bowel obstruction) (HCC) 01/27/2014   Atrial fibrillation (HCC) 10/28/2013   Palpitations 10/28/2013   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   Past Medical History:  Past Medical History:  Diagnosis Date   Allergic rhinitis    Anxiety    Atrial fibrillation (HCC) 10/28/2013   diagnoses in the 20's, well controlled with Digoxin and Inderal    Depression    Diabetes mellitus without complication (HCC)    Gall stones    Heart murmur    Kidney disease, medullary sponge    Kidney stones    Mitral valve disorders(424.0) 10/28/2013   Mitral valve prolapse    congenital    MRSA (methicillin resistant Staphylococcus aureus)    9/10   Osteopenia    dexa 4/09, improved but not resolved 2011 DEXA   Palpitations 10/28/2013   Schizophrenia (HCC)    Shingles    2000   Past Surgical History:  Past Surgical History:  Procedure Laterality Date   APPENDECTOMY  01/29/2014   CYSTOSCOPY W/ STONE MANIPULATION  1990's   CYSTOSCOPY W/ URETERAL STENT PLACEMENT Left 05/28/2023   Procedure: CYSTOSCOPY WITH RETROGRADE PYELOGRAM/URETERAL STENT PLACEMENT;  Surgeon: Adonis Brook, MD;  Location: [facility] OR;  Service: Urology;  Laterality: Left;   ELBOW ARTHROSCOPY Left 2007   ELBOW ARTHROSCOPY Right 2008   "cyst removed; tricep"    ELBOW ARTHROSCOPY WITH TENDON RECONSTRUCTION Right 2005; 2006   EXPLORATORY LAPAROTOMY  01/29/2014   KNEE ARTHROSCOPY Right 2004   "bone chip removed"   KNEE ARTHROSCOPY Right 02/2009   "cleanup loose cartilage"   LAPAROTOMY N/A 01/29/2014   Procedure: EXPLORATORY LAPAROTOMY, SMALL BOWEL RESECTION, APPENDECTOMY;  Surgeon: Manus Rudd, MD;  Location: [facility] OR;  Service: General;  Laterality: N/A;   NASAL SINUS SURGERY  X 2   "for polyps"   SHOULDER ARTHROSCOPY Right 1990's X 2   SHOULDER ARTHROSCOPY W/ ROTATOR CUFF REPAIR Right    SMALL INTESTINE SURGERY  01/29/2014   HPI:  77 year old woman who presented to [facility] ED 1/20 for AMS and lethargy.  Found to have ?DKA, urosepsis.ObstructedL UVJ calculus with obstructive uropathy. Uro consulted. Taken to OR  05/28/23 for L ureteral stent. PMHx significant for Afib (on ASA/digoxin/propranolol), mitral valve prolapse, HLD, T2DM, nephrolithiasis, hypothyroidism, anxiety/depression, schizophrenia.   Assessment / Plan / Recommendation Clinical Impression  Pt seen for skilled ST evaluation of speech and language/cognition to determine new cog baseline. SLP attempted to give SLUMS, pt not appropriate due to severity of deficits/processing speed. The pt was oriented to self, but disoriented to time, location, and situation. The pt was given verbal and written choices of two for the month and the location and minutes of delay for processing speed, the pt was unable to answer the question. The pt was able to follow simple one step directions but not simple two step directions given verbal cues. The pt was able to  name items independently in a timely fashion. However, when asked to answer personal questions or to complete simple numeric repetition task, the pt had no verbal response given light tactile cues, and max verbal cues to attend to the conversation at hand. Pt did report feeling "very different from normal" when directly asked, unable to elaborate further. Pt  intermittently answered both simple close ended questions or open-ended questions with small phrases or single words and most verbal responses took verbal cues and delayed processing speed. Per chart review, it appears the pt has had a significant cog decline from baseline. The pt currently presents with a severe cognitive impairment and would benefit from continued ST services to address the concerns listed above. SLP to f/u closely to achieve rehab goals.    SLP Assessment  SLP Recommendation/Assessment: Patient needs continued Speech Lanaguage Pathology Services SLP Visit Diagnosis: Cognitive communication deficit (R41.841);Attention and concentration deficit    Recommendations for follow up therapy are one component of a multi-disciplinary discharge planning process, led by the attending physician.  Recommendations may be updated based on patient status, additional functional criteria and insurance authorization.       Assistance Recommended at Discharge     Functional Status Assessment Patient has had a recent decline in their functional status and demonstrates the ability to make significant improvements in function in a reasonable and predictable amount of time.  Frequency and Duration min 3x week  1 week      SLP Evaluation Cognition  Overall Cognitive Status: Impaired/Different from baseline Arousal/Alertness: Lethargic Orientation Level: Oriented to person;Disoriented to place;Disoriented to time;Disoriented to situation Year: 2025 Month: March (Given verbal choice of two) Day of Week: Incorrect Attention: Focused Focused Attention: Impaired Focused Attention Impairment: Verbal basic;Functional complex;Verbal complex;Functional basic Memory: Impaired Memory Impairment: Other (comment) (Unable to fully assess due to delayed processing speed and reduced participation with evaluation. Pt does report feeling "a lot worse" than normal.) Awareness: Impaired Awareness Impairment:  Intellectual impairment Problem Solving: Impaired Problem Solving Impairment: Verbal complex;Functional complex;Functional basic Executive Function: Sequencing;Self Monitoring;Initiating Sequencing: Impaired Sequencing Impairment: Functional complex;Verbal complex;Verbal basic;Functional basic Initiating: Impaired Initiating Impairment: Verbal complex;Functional complex Self Monitoring: Impaired Self Monitoring Impairment: Verbal basic;Functional complex;Functional basic;Verbal complex Safety/Judgment: Other (comment) Comments: Unable to assess       Comprehension  Auditory Comprehension Overall Auditory Comprehension: Other (comment) Yes/No Questions: Impaired Basic Biographical Questions: 76-100% accurate Basic Immediate Environment Questions: 75-100% accurate Commands: Impaired Two Step Basic Commands: 25-49% accurate Conversation: Simple Interfering Components: Attention;Processing speed;Motor planning EffectiveTechniques: Extra processing time;Pausing;Repetition;Visual/Gestural cues Visual Recognition/Discrimination Discrimination: Not tested Reading Comprehension Reading Status: Not tested    Expression Expression Primary Mode of Expression: Verbal Verbal Expression Overall Verbal Expression: Other (comment) Initiation:  (Not formally assessed, suspected impairment) Level of Generative/Spontaneous Verbalization: Phrase Naming: No impairment Pragmatics: Unable to assess Interfering Components: Attention Effective Techniques: Written cues Written Expression Written Expression: Not tested   Oral / Motor  Motor Speech Overall Motor Speech: Appears within functional limits for tasks assessed Respiration: Within functional limits Phonation: Normal;Low vocal intensity Resonance: Within functional limits Articulation: Within functional limitis Intelligibility: Intelligible Motor Planning: Not tested Motor Speech Errors: Not applicable           Dione Housekeeper  M.S. CCC-SLP

## 2023-06-02 NOTE — Plan of Care (Signed)
Problem: Clinical Measurements: Goal: Diagnostic test results will improve Outcome: Progressing Goal: Respiratory complications will improve Outcome: Progressing Goal: Cardiovascular complication will be avoided Outcome: Progressing   Problem: Activity: Goal: Risk for activity intolerance will decrease Outcome: Progressing   Problem: Nutrition: Goal: Adequate nutrition will be maintained Outcome: Progressing

## 2023-06-03 DIAGNOSIS — N390 Urinary tract infection, site not specified: Secondary | ICD-10-CM | POA: Diagnosis not present

## 2023-06-03 DIAGNOSIS — A419 Sepsis, unspecified organism: Secondary | ICD-10-CM | POA: Diagnosis not present

## 2023-06-03 LAB — BASIC METABOLIC PANEL
Anion gap: 8 (ref 5–15)
BUN: 21 mg/dL (ref 8–23)
CO2: 25 mmol/L (ref 22–32)
Calcium: 7.5 mg/dL — ABNORMAL LOW (ref 8.9–10.3)
Chloride: 105 mmol/L (ref 98–111)
Creatinine, Ser: 0.98 mg/dL (ref 0.44–1.00)
GFR, Estimated: >60 mL/min (ref >60)
Glucose, Bld: 115 mg/dL — ABNORMAL HIGH (ref 70–99)
Potassium: 3.3 mmol/L — ABNORMAL LOW (ref 3.5–5.1)
Sodium: 138 mmol/L (ref 135–145)

## 2023-06-03 LAB — MAGNESIUM: Magnesium: 1.8 mg/dL (ref 1.7–2.4)

## 2023-06-03 LAB — GLUCOSE, CAPILLARY
Glucose-Capillary: 122 mg/dL — ABNORMAL HIGH (ref 70–99)
Glucose-Capillary: 137 mg/dL — ABNORMAL HIGH (ref 70–99)
Glucose-Capillary: 148 mg/dL — ABNORMAL HIGH (ref 70–99)
Glucose-Capillary: 232 mg/dL — ABNORMAL HIGH (ref 70–99)

## 2023-06-03 LAB — CBC
HCT: 34.2 % — ABNORMAL LOW (ref 36.0–46.0)
Hemoglobin: 11.5 g/dL — ABNORMAL LOW (ref 12.0–15.0)
MCH: 29.3 pg (ref 26.0–34.0)
MCHC: 33.6 g/dL (ref 30.0–36.0)
MCV: 87.2 fL (ref 80.0–100.0)
Platelets: 153 10*3/uL (ref 150–400)
RBC: 3.92 MIL/uL (ref 3.87–5.11)
RDW: 13.5 % (ref 11.5–15.5)
WBC: 8 10*3/uL (ref 4.0–10.5)
nRBC: 0 % (ref 0.0–0.2)

## 2023-06-03 LAB — PHOSPHORUS: Phosphorus: 4.1 mg/dL (ref 2.5–4.6)

## 2023-06-03 LAB — BRAIN NATRIURETIC PEPTIDE: B Natriuretic Peptide: 160.2 pg/mL — ABNORMAL HIGH (ref 0.0–100.0)

## 2023-06-03 MED ORDER — ENSURE ENLIVE PO LIQD
237.0000 mL | Freq: Two times a day (BID) | ORAL | Status: DC
  Administered 2023-06-03 – 2023-06-11 (×16): 237 mL via ORAL
  Filled 2023-06-03 (×2): qty 237

## 2023-06-03 MED ORDER — SODIUM CHLORIDE 0.9 % IV SOLN: INTRAVENOUS | Status: AC

## 2023-06-03 MED ORDER — POTASSIUM CHLORIDE CRYS ER 20 MEQ PO TBCR
40.0000 meq | EXTENDED_RELEASE_TABLET | Freq: Once | ORAL | Status: AC
  Administered 2023-06-03: 40 meq via ORAL
  Filled 2023-06-03: qty 2

## 2023-06-03 MED ORDER — SODIUM CHLORIDE 0.9 % IV BOLUS
500.0000 mL | Freq: Once | INTRAVENOUS | Status: AC
  Administered 2023-06-03: 500 mL via INTRAVENOUS

## 2023-06-03 NOTE — Plan of Care (Signed)
  Problem: Clinical Measurements: Goal: Diagnostic test results will improve Outcome: Progressing Goal: Respiratory complications will improve Outcome: Progressing Goal: Cardiovascular complication will be avoided Outcome: Progressing   Problem: Activity: Goal: Risk for activity intolerance will decrease Outcome: Progressing   Problem: Nutrition: Goal: Adequate nutrition will be maintained Outcome: Progressing   Problem: Coping: Goal: Level of anxiety will decrease Outcome: Progressing   Problem: Elimination: Goal: Will not experience complications related to bowel motility Outcome: Progressing Goal: Will not experience complications related to urinary retention Outcome: Progressing   Problem: Pain Managment: Goal: General experience of comfort will improve and/or be controlled Outcome: Progressing   Problem: Safety: Goal: Ability to remain free from injury will improve Outcome: Progressing   Problem: Skin Integrity: Goal: Risk for impaired skin integrity will decrease Outcome: Progressing   Problem: Activity: Goal: Ability to tolerate increased activity will improve Outcome: Progressing   Problem: Respiratory: Goal: Ability to maintain a clear airway and adequate ventilation will improve Outcome: Progressing   Problem: Role Relationship: Goal: Method of communication will improve Outcome: Progressing   Problem: Education: Goal: Ability to describe self-care measures that may prevent or decrease complications (Diabetes Survival Skills Education) will improve Outcome: Progressing Goal: Individualized Educational Video(s) Outcome: Progressing   Problem: Coping: Goal: Ability to adjust to condition or change in health will improve Outcome: Progressing   Problem: Fluid Volume: Goal: Ability to maintain a balanced intake and output will improve Outcome: Progressing   Problem: Health Behavior/Discharge Planning: Goal: Ability to identify and utilize  available resources and services will improve Outcome: Progressing Goal: Ability to manage health-related needs will improve Outcome: Progressing   Problem: Metabolic: Goal: Ability to maintain appropriate glucose levels will improve Outcome: Progressing   Problem: Nutritional: Goal: Maintenance of adequate nutrition will improve Outcome: Progressing Goal: Progress toward achieving an optimal weight will improve Outcome: Progressing   Problem: Skin Integrity: Goal: Risk for impaired skin integrity will decrease Outcome: Progressing   Problem: Tissue Perfusion: Goal: Adequacy of tissue perfusion will improve Outcome: Progressing

## 2023-06-03 NOTE — Progress Notes (Signed)
PROGRESS NOTE  Jannetta Massey NWG:[id number] DOB: [birth year] DOA: 05/28/2023 PCP: Darrow Bussing, MD   LOS: 6 days   Brief narrative:  78 years old female with past medical history of atrial fibrillation, hyperlipidemia, type 2 diabetes, hypothyroidism, anxiety, depression, schizophrenia who initially presented to the hospital from assisted living facility with altered mental status and lethargy.  She was also noted to have hyperglycemia with blood glucose levels in the 500s.  In the ED vitals were stable.  Labs showed hemoglobin of 15.4.  Platelet was low at 121.  Sodium was slightly low at 126 with potassium high at 5.1 with bicarb of 14 and creatinine of 2.4 from baseline 0.61.  Magnesium was low at 1.4 as well.  Troponin was slightly elevated with beta hydroxybutyrate elevation and lactic acid was elevated of 6.5.  Patient was started on broad-spectrum antibiotic for presumed sepsis.  CT head scan was negative for acute findings., CT chest abdomen pelvis showed a 7 mm UVJ calculus with moderate left obstructive hydronephrosis.  There was some gas noted in the bladder and left ureter and collecting system.  Urology was consulted for left ureteral stent placement.  Patient was then admitted to the ICU postoperatively.  At this time patient has been considered stable for transfer out of ICU.  Principal Problem:   Sepsis due to urinary tract infection (HCC) Active Problems:   AKI (acute kidney injury) (HCC)   Acute encephalopathy   Diabetic ketoacidosis without coma associated with type 2 diabetes mellitus (HCC)   Ureterolithiasis   Septic shock (HCC)   Severe sepsis with septic shock due to complicated E. coli urinary tract infection with obstructive uropathy,  E. coli bacteremia Lactic acidosis Patient had obstructive uropathy due to obstructing L UVJ calculus status post stent by urology on 05/28/2023. Temperature max of 101.8 F.  F.  On IV cefazolin  for pansensitive E. coli.  Urology  following.  VBG from today at 8.0.  Will continue to monitor.  I did communicate with urology who recommended scanning tomorrow if persistence of fever.  Patient however.  Clinically improving.   Acute respiratory insufficiency, postprocedure, resolved Patient was successfully extubated  currently on room air.  No active issues.   Diabetic ketoacidosis DKA has resolved.  Continue on insulin regimen.  Currently on Semglee  13 units twice daily, Continue sliding scale insulin.  Overall glycemic control has improved.  Latest POC glucose of 122.   Acute metabolic/septic encephalopathy Secondary to sepsis and DKA.  Following commands.  States that she feels a little sleepy.   AKI, anion gap metabolic acidosis Creatinine today at 0.9.  Initial creatinine of 2.4.  Has improved at this time.  Hypomagnesemia Replenished and improved.  Latest magnesium of 1.8.  Hyponatremia Improved at this time.  Latest sodium of 138  Hypokalemia Will continue to replenish.  Latest potassium of 3.53  Hypophosphatemia.  Replenished.  Latest phosphorus level of 4.1.  Paroxysmal A-fib On sinus rhythm at this time.  Continue aspirin for prophylaxis.   Hypothyroidism Continue home Synthroid   Depression/Anxiety/Schizophrenia Continue risperidone, Lexapro as clinically appropriate   Thrombocytopenia due to critical illness Improving platelet count 102 from 82 < 60.  Continue to monitor.   Assessment/Plan: DVT prophylaxis: heparin injection 5,000 Units Start: 05/29/23 0600 SCDs Start: 05/28/23 2155   Disposition: Skilled nursing facility as per PT recommendation  Status is: Inpatient Remains inpatient appropriate because: IV antibiotics, need for rehabilitation, pending clinical improvement, fevers.    Code Status:  Code Status: Full Code  Family Communication:  Spoke with the patient's son on the phone on 06/02/2023  Consultants: PCCM Urology  Procedures: Left ureteric stent placement by  urology 05/28/2023  Anti-infectives:  Cefazolin IV 06/02/2023>  Anti-infectives (From admission, onward)    Start     Dose/Rate Route Frequency Ordered Stop   06/02/23 0600  ceFAZolin (ANCEF) IVPB 2g/100 mL premix        2 g 200 mL/hr over 30 Minutes Intravenous Every 8 hours 06/01/23 1326 06/08/23 0559   05/31/23 0600  cefTRIAXone (ROCEPHIN) 2 g in sodium chloride 0.9 % 100 mL IVPB  Status:  Discontinued        2 g 200 mL/hr over 30 Minutes Intravenous Every 24 hours 05/30/23 0855 06/01/23 1326   05/30/23 0945  cefTRIAXone (ROCEPHIN) 1 g in sodium chloride 0.9 % 100 mL IVPB        1 g 200 mL/hr over 30 Minutes Intravenous  Once 05/30/23 0856 05/30/23 0937   05/29/23 0600  cefTRIAXone (ROCEPHIN) 1 g in sodium chloride 0.9 % 100 mL IVPB  Status:  Discontinued        1 g 200 mL/hr over 30 Minutes Intravenous Every 24 hours 05/28/23 2204 05/30/23 0855   05/28/23 2127  vancomycin (VANCOCIN) 1-5 GM/200ML-% IVPB       Note to Pharmacy: Brien Mates D: cabinet override      05/28/23 2127 05/29/23 0929   05/28/23 2000  vancomycin (VANCOREADY) IVPB 1500 mg/300 mL  Status:  Discontinued        1,500 mg 150 mL/hr over 120 Minutes Intravenous  Once 05/28/23 1957 05/28/23 2203   05/28/23 1900  Vancomycin (VANCOCIN) 1,500 mg in sodium chloride 0.9 % 500 mL IVPB  Status:  Discontinued        1,500 mg 250 mL/hr over 120 Minutes Intravenous  Once 05/28/23 1853 05/28/23 1958   05/28/23 1845  ceFEPIme (MAXIPIME) 2 g in sodium chloride 0.9 % 100 mL IVPB        2 g 200 mL/hr over 30 Minutes Intravenous  Once 05/28/23 1843 05/28/23 1935   05/28/23 1845  metroNIDAZOLE (FLAGYL) IVPB 500 mg        500 mg 100 mL/hr over 60 Minutes Intravenous  Once 05/28/23 1843 05/28/23 2048   05/28/23 1845  vancomycin (VANCOCIN) IVPB 1000 mg/200 mL premix  Status:  Discontinued        1,000 mg 200 mL/hr over 60 Minutes Intravenous  Once 05/28/23 1843 05/28/23 1853       Subjective: Today, patient was seen and  examined at bedside.  Patient states that she feels better.  Denies any nausea, vomiting,  chills or rigor.  Denies any urinary urgency, frequency or dysuria.  Was noted to have fever of 101.8 F.  Objective: Vitals:   06/03/23 0527 06/03/23 0846  BP: (!) 128/57 (!) 144/61  Pulse: 64 70  Resp:  15  Temp: 98.9 F (37.2 C) 99.1 F (37.3 C)  SpO2: 100% 99%    Intake/Output Summary (Last 24 hours) at 06/03/2023 1156 Last data filed at 06/02/2023 2118 Gross per 24 hour  Intake 460 ml  Output 720 ml  Net -260 ml   Filed Weights   05/29/23 2100 05/30/23 0442 06/01/23 0500  Weight: 77.5 kg 77.5 kg 79.5 kg   Body mass index is 27.45 kg/m.   Physical Exam:  GENERAL: Patient is alert awake Not in obvious distress.  Communicative oriented.  Elderly female.  Nasal cannula  oxygen, HENT: No scleral pallor or icterus. Pupils equally reactive to light. Oral mucosa is moist NECK: is supple, no gross swelling noted. CHEST: Clear to auscultation. No crackles or wheezes.  CVS: S1 and S2 heard, no murmur. Regular rate and rhythm.  ABDOMEN: Soft, non-tender, bowel sounds are present. EXTREMITIES: Trace lower extremity edema. CNS: Cranial nerves are intact.  Moves all extremities.  Generalized weakness noted. SKIN: warm and dry without rashes.  Data Review: I have personally reviewed the following laboratory data and studies,  CBC: Recent Labs  Lab 05/28/23 1829 05/28/23 1836 05/30/23 0429 05/31/23 0400 06/01/23 0437 06/02/23 0417 06/03/23 0412  WBC 6.7   < > 7.4 5.5 6.1 7.6 8.0  NEUTROABS 5.4  --   --   --   --   --   --   HGB 15.4*   < > 11.8* 11.2* 11.8* 11.4* 11.5*  HCT 46.7*   < > 34.2* 33.6* 34.8* 34.0* 34.2*  MCV 88.8   < > 87.5 88.0 87.2 87.9 87.2  PLT 121*   < > 55* 60* 82* 102* 153   < > = values in this interval not displayed.   Basic Metabolic Panel: Recent Labs  Lab 05/29/23 0429 05/29/23 0859 05/30/23 0429 05/31/23 0400 06/01/23 0437 06/02/23 0417  06/03/23 0412  NA 130*   < > 136 133* 138 138 138  K 4.0   < > 3.5 3.3* 3.7 3.5 3.3*  CL 101  --  107 99 105 106 105  CO2 18*  --  24 23 24 23 25   GLUCOSE 274*  --  140* 238* 174* 112* 115*  BUN 30*  --  22 21 22 22 21   CREATININE 1.76*  --  1.44* 1.38* 1.18* 1.15* 0.98  CALCIUM 7.7*  --  7.9* 7.7* 8.7* 7.9* 7.5*  MG 1.8  --   --  1.8 1.7 1.9 1.8  PHOS 2.6  --   --   --  1.6*  --  4.1   < > = values in this interval not displayed.   Liver Function Tests: Recent Labs  Lab 05/28/23 1829 06/02/23 0417  AST 49* 42*  ALT 40 30  ALKPHOS 57 133*  BILITOT 1.5* 0.8  PROT 6.5 4.7*  ALBUMIN 3.3* 1.7*   No results for input(s): "LIPASE", "AMYLASE" in the last 168 hours. Recent Labs  Lab 05/28/23 1820  AMMONIA 19   Cardiac Enzymes: Recent Labs  Lab 05/28/23 1829  CKTOTAL 150   BNP (last 3 results) Recent Labs    05/28/23 1820 06/03/23 0412  BNP 89.3 160.2*    ProBNP (last 3 results) No results for input(s): "PROBNP" in the last 8760 hours.  CBG: Recent Labs  Lab 06/02/23 0807 06/02/23 1238 06/02/23 1722 06/02/23 2045 06/03/23 0848  GLUCAP 98 136* 135* 140* 122*   Recent Results (from the past 240 hours)  Blood Culture (routine x 2)     Status: Abnormal   Collection Time: 05/28/23  6:17 PM   Specimen: BLOOD  Result Value Ref Range Status   Specimen Description BLOOD SITE NOT SPECIFIED  Final   Special Requests   Final    BOTTLES DRAWN AEROBIC AND ANAEROBIC Blood Culture results may not be optimal due to an inadequate volume of blood received in culture bottles   Culture  Setup Time   Final    GRAM NEGATIVE RODS IN BOTH AEROBIC AND ANAEROBIC BOTTLES CRITICAL RESULT CALLED TO, READ BACK BY AND VERIFIED WITH: PHARMD  Francie Massing [phone] FCP Performed at [facility], [address], [place], [place] [address]    Culture ESCHERICHIA COLI (A)  Final   Report Status 05/31/2023 FINAL  Final   Organism ID, Bacteria ESCHERICHIA COLI  Final   Organism ID,  Bacteria ESCHERICHIA COLI  Final      Susceptibility   Escherichia coli - KIRBY BAUER*    CEFAZOLIN SENSITIVE Sensitive    Escherichia coli - MIC*    AMPICILLIN <=2 SENSITIVE Sensitive     CEFEPIME <=0.12 SENSITIVE Sensitive     CEFTAZIDIME <=1 SENSITIVE Sensitive     CEFTRIAXONE <=0.25 SENSITIVE Sensitive     CIPROFLOXACIN <=0.25 SENSITIVE Sensitive     GENTAMICIN <=1 SENSITIVE Sensitive     IMIPENEM <=0.25 SENSITIVE Sensitive     TRIMETH/SULFA <=20 SENSITIVE Sensitive     AMPICILLIN/SULBACTAM <=2 SENSITIVE Sensitive     PIP/TAZO <=4 SENSITIVE Sensitive ug/mL    * ESCHERICHIA COLI    ESCHERICHIA COLI  Blood Culture ID Panel (Reflexed)     Status: Abnormal   Collection Time: 05/28/23  6:17 PM  Result Value Ref Range Status   Enterococcus faecalis NOT DETECTED NOT DETECTED Final   Enterococcus Faecium NOT DETECTED NOT DETECTED Final   Listeria monocytogenes NOT DETECTED NOT DETECTED Final   Staphylococcus species NOT DETECTED NOT DETECTED Final   Staphylococcus aureus (BCID) NOT DETECTED NOT DETECTED Final   Staphylococcus epidermidis NOT DETECTED NOT DETECTED Final   Staphylococcus lugdunensis NOT DETECTED NOT DETECTED Final   Streptococcus species NOT DETECTED NOT DETECTED Final   Streptococcus agalactiae NOT DETECTED NOT DETECTED Final   Streptococcus pneumoniae NOT DETECTED NOT DETECTED Final   Streptococcus pyogenes NOT DETECTED NOT DETECTED Final   A.calcoaceticus-baumannii NOT DETECTED NOT DETECTED Final   Bacteroides fragilis NOT DETECTED NOT DETECTED Final   Enterobacterales DETECTED (A) NOT DETECTED Final    Comment: Enterobacterales represent a large order of gram negative bacteria, not a single organism. CRITICAL RESULT CALLED TO, READ BACK BY AND VERIFIED WITH: PHARMD BAILEY A [phone] FCP    Enterobacter cloacae complex NOT DETECTED NOT DETECTED Final   Escherichia coli DETECTED (A) NOT DETECTED Final    Comment: CRITICAL RESULT CALLED TO, READ BACK BY AND  VERIFIED WITH: PHARMD BAILEY A [phone] FCP    Klebsiella aerogenes NOT DETECTED NOT DETECTED Final   Klebsiella oxytoca NOT DETECTED NOT DETECTED Final   Klebsiella pneumoniae NOT DETECTED NOT DETECTED Final   Proteus species NOT DETECTED NOT DETECTED Final   Salmonella species NOT DETECTED NOT DETECTED Final   Serratia marcescens NOT DETECTED NOT DETECTED Final   Haemophilus influenzae NOT DETECTED NOT DETECTED Final   Neisseria meningitidis NOT DETECTED NOT DETECTED Final   Pseudomonas aeruginosa NOT DETECTED NOT DETECTED Final   Stenotrophomonas maltophilia NOT DETECTED NOT DETECTED Final   Candida albicans NOT DETECTED NOT DETECTED Final   Candida auris NOT DETECTED NOT DETECTED Final   Candida glabrata NOT DETECTED NOT DETECTED Final   Candida krusei NOT DETECTED NOT DETECTED Final   Candida parapsilosis NOT DETECTED NOT DETECTED Final   Candida tropicalis NOT DETECTED NOT DETECTED Final   Cryptococcus neoformans/gattii NOT DETECTED NOT DETECTED Final   CTX-M ESBL NOT DETECTED NOT DETECTED Final   Carbapenem resistance IMP NOT DETECTED NOT DETECTED Final   Carbapenem resistance KPC NOT DETECTED NOT DETECTED Final   Carbapenem resistance NDM NOT DETECTED NOT DETECTED Final   Carbapenem resist OXA 48 LIKE NOT DETECTED NOT DETECTED Final  Carbapenem resistance VIM NOT DETECTED NOT DETECTED Final    Comment: Performed at [facility], [address] [place], [place] [address]  Resp panel by RT-PCR (RSV, Flu A&B, Covid) Anterior Nasal Swab     Status: None   Collection Time: 05/28/23  6:18 PM   Specimen: Anterior Nasal Swab  Result Value Ref Range Status   SARS Coronavirus 2 by RT PCR NEGATIVE NEGATIVE Final   Influenza A by PCR NEGATIVE NEGATIVE Final   Influenza B by PCR NEGATIVE NEGATIVE Final    Comment: (NOTE) The Xpert Xpress SARS-CoV-2/FLU/RSV plus assay is intended as an aid in the diagnosis of influenza from Nasopharyngeal swab specimens and should not be used  as a sole basis for treatment. Nasal washings and aspirates are unacceptable for Xpert Xpress SARS-CoV-2/FLU/RSV testing.  Fact Sheet for Patients: BloggerCourse.com  Fact Sheet for Healthcare Providers: SeriousBroker.it  This test is not yet approved or cleared by the [place] FDA and has been authorized for detection and/or diagnosis of SARS-CoV-2 by FDA under an Emergency Use Authorization (EUA). This EUA will remain in effect (meaning this test can be used) for the duration of the COVID-19 declaration under Section 564(b)(1) of the Act, 21 U.S.C. section 360bbb-3(b)(1), unless the authorization is terminated or revoked.     Resp Syncytial Virus by PCR NEGATIVE NEGATIVE Final    Comment: (NOTE) Fact Sheet for Patients: BloggerCourse.com  Fact Sheet for Healthcare Providers: SeriousBroker.it  This test is not yet approved or cleared by the [place] FDA and has been authorized for detection and/or diagnosis of SARS-CoV-2 by FDA under an Emergency Use Authorization (EUA). This EUA will remain in effect (meaning this test can be used) for the duration of the COVID-19 declaration under Section 564(b)(1) of the Act, 21 U.S.C. section 360bbb-3(b)(1), unless the authorization is terminated or revoked.  Performed at [facility], [address] [place], [place] [address]   Blood Culture (routine x 2)     Status: Abnormal   Collection Time: 05/28/23  7:00 PM   Specimen: BLOOD  Result Value Ref Range Status   Specimen Description BLOOD SITE NOT SPECIFIED  Final   Special Requests   Final    BOTTLES DRAWN AEROBIC AND ANAEROBIC Blood Culture results may not be optimal due to an inadequate volume of blood received in culture bottles   Culture  Setup Time   Final    GRAM NEGATIVE RODS IN BOTH AEROBIC AND ANAEROBIC BOTTLES CRITICAL VALUE NOTED.  VALUE IS CONSISTENT  WITH PREVIOUSLY REPORTED AND CALLED VALUE.    Culture (A)  Final    ESCHERICHIA COLI SUSCEPTIBILITIES PERFORMED ON PREVIOUS CULTURE WITHIN THE LAST 5 DAYS. Performed at [facility], [address], [place], [place] [address]    Report Status 05/31/2023 FINAL  Final  Urine Culture     Status: Abnormal   Collection Time: 05/28/23 10:00 PM   Specimen: Urine, Cystoscope  Result Value Ref Range Status   Specimen Description CYSTOSCOPY  Final   Special Requests   Final    NONE Performed at [facility], [address] [place], [place] [address]    Culture >=100,000 COLONIES/mL ESCHERICHIA COLI (A)  Final   Report Status 05/30/2023 FINAL  Final   Organism ID, Bacteria ESCHERICHIA COLI (A)  Final      Susceptibility   Escherichia coli - MIC*    AMPICILLIN 4 SENSITIVE Sensitive     CEFEPIME <=0.12 SENSITIVE Sensitive  CEFTAZIDIME <=1 SENSITIVE Sensitive     CEFTRIAXONE <=0.25 SENSITIVE Sensitive     CIPROFLOXACIN <=0.25 SENSITIVE Sensitive     GENTAMICIN <=1 SENSITIVE Sensitive     IMIPENEM <=0.25 SENSITIVE Sensitive     TRIMETH/SULFA <=20 SENSITIVE Sensitive     AMPICILLIN/SULBACTAM <=2 SENSITIVE Sensitive     PIP/TAZO <=4 SENSITIVE Sensitive ug/mL    * >=100,000 COLONIES/mL ESCHERICHIA COLI  MRSA Next Gen by PCR, Nasal     Status: None   Collection Time: 05/28/23 10:04 PM   Specimen: Nasal Mucosa; Nasal Swab  Result Value Ref Range Status   MRSA by PCR Next Gen NOT DETECTED NOT DETECTED Final    Comment: (NOTE) The GeneXpert MRSA Assay (FDA approved for NASAL specimens only), is one component of a comprehensive MRSA colonization surveillance program. It is not intended to diagnose MRSA infection nor to guide or monitor treatment for MRSA infections. Test performance is not FDA approved in patients less than 75 years old. Performed at [facility], [address], [place], [place] [address]   Culture, blood (Routine X 2) w Reflex to ID Panel     Status:  None (Preliminary result)   Collection Time: 06/02/23  9:18 PM   Specimen: BLOOD LEFT HAND  Result Value Ref Range Status   Specimen Description BLOOD LEFT HAND  Final   Special Requests   Final    BOTTLES DRAWN AEROBIC AND ANAEROBIC Blood Culture results may not be optimal due to an inadequate volume of blood received in culture bottles   Culture   Final    NO GROWTH < 12 HOURS Performed at [facility], [address], [place], [place] [address]    Report Status PENDING  Incomplete  Culture, blood (Routine X 2) w Reflex to ID Panel     Status: None (Preliminary result)   Collection Time: 06/02/23  9:25 PM   Specimen: BLOOD LEFT HAND  Result Value Ref Range Status   Specimen Description BLOOD LEFT HAND  Final   Special Requests   Final    BOTTLES DRAWN AEROBIC AND ANAEROBIC Blood Culture results may not be optimal due to an inadequate volume of blood received in culture bottles   Culture   Final    NO GROWTH < 12 HOURS Performed at [facility], [address], [place], [place] [address]    Report Status PENDING  Incomplete     Studies: DG Chest Port 1 View Result Date: 06/02/2023 CLINICAL DATA:  [id number] Fever [id number] EXAM: PORTABLE CHEST 1 VIEW COMPARISON:  Chest x-ray 05/28/2023 FINDINGS: The heart and mediastinal contours are grossly unchanged given AP portable technique. Low lung volumes. No focal consolidation. Mild pulmonary edema. Likely bilateral trace pleural effusions. No pneumothorax. No acute osseous abnormality. IMPRESSION: Low lung volumes with mild pulmonary edema and likely bilateral trace pleural effusions. Electronically Signed   By: Tish Frederickson M.D.   On: 06/02/2023 21:43      Joycelyn Das, MD  [facility] 06/03/2023  If 7PM-7AM, please contact night-coverage

## 2023-06-03 NOTE — Progress Notes (Signed)
[facility] night cross cover note:   I was notified by RN of the patient's temp of 101.3. This is relative to yesterday's 24-hour temperature max of one 101.8, potentially representing defervescence in this patient was hospitalized with urinary tract infection complicated by E. coli bacteremia, currently on Ancef.  Additional vitals at this time appears stable, including heart rates in the 70s, systolic blood pressures in the 130s, respiratory rate 18, and oxygen saturation in the mid 90s on room air.  Tylenol is being administered at this time.  For now, no additional new orders from my standpoint. Will continue with existing abx for now and continue to closely monitor response.     Newton Pigg, DO Hospitalist

## 2023-06-04 ENCOUNTER — Inpatient Hospital Stay (HOSPITAL_COMMUNITY): Payer: Medicare HMO

## 2023-06-04 DIAGNOSIS — N390 Urinary tract infection, site not specified: Secondary | ICD-10-CM | POA: Diagnosis not present

## 2023-06-04 DIAGNOSIS — A419 Sepsis, unspecified organism: Secondary | ICD-10-CM | POA: Diagnosis not present

## 2023-06-04 LAB — GLUCOSE, CAPILLARY
Glucose-Capillary: 181 mg/dL — ABNORMAL HIGH (ref 70–99)
Glucose-Capillary: 244 mg/dL — ABNORMAL HIGH (ref 70–99)
Glucose-Capillary: 220 mg/dL — ABNORMAL HIGH (ref 70–99)
Glucose-Capillary: 131 mg/dL — ABNORMAL HIGH (ref 70–99)

## 2023-06-04 LAB — BASIC METABOLIC PANEL
Anion gap: 10 (ref 5–15)
BUN: 23 mg/dL (ref 8–23)
CO2: 20 mmol/L — ABNORMAL LOW (ref 22–32)
Calcium: 7.5 mg/dL — ABNORMAL LOW (ref 8.9–10.3)
Chloride: 103 mmol/L (ref 98–111)
Creatinine, Ser: 1.01 mg/dL — ABNORMAL HIGH (ref 0.44–1.00)
GFR, Estimated: 58 mL/min — ABNORMAL LOW (ref >60)
Glucose, Bld: 145 mg/dL — ABNORMAL HIGH (ref 70–99)
Potassium: 4.6 mmol/L (ref 3.5–5.1)
Sodium: 133 mmol/L — ABNORMAL LOW (ref 135–145)

## 2023-06-04 LAB — CBC
HCT: 38.1 % (ref 36.0–46.0)
Hemoglobin: 12.5 g/dL (ref 12.0–15.0)
MCH: 29.6 pg (ref 26.0–34.0)
MCHC: 32.8 g/dL (ref 30.0–36.0)
MCV: 90.3 fL (ref 80.0–100.0)
Platelets: 120 10*3/uL — ABNORMAL LOW (ref 150–400)
RBC: 4.22 MIL/uL (ref 3.87–5.11)
RDW: 13.8 % (ref 11.5–15.5)
WBC: 8.8 10*3/uL (ref 4.0–10.5)
nRBC: 0 % (ref 0.0–0.2)

## 2023-06-04 LAB — MAGNESIUM: Magnesium: 1.8 mg/dL (ref 1.7–2.4)

## 2023-06-04 MED ORDER — IOHEXOL 350 MG/ML SOLN
75.0000 mL | Freq: Once | INTRAVENOUS | Status: AC | PRN
  Administered 2023-06-04: 75 mL via INTRAVENOUS

## 2023-06-04 NOTE — TOC Initial Note (Addendum)
Transition of Care [facility]) - Initial/Assessment Note    Patient Details  Name: Deanna Shea MRN: [id number] Date of Birth: [birth year]  Transition [facility]) CM/SW Contact:    Marliss Coots, LCSW Phone Number: 06/04/2023, 1:32 PM  Clinical Narrative:                  1:32 PM Per chart review, patient is from [facility], currently disoriented x3, and has therapy recommendation of discharge to SNF. CSW attempted to call patient's son, Brett Canales, regarding recommendation and TOC consult (medication assistance), but there was no response and a voicemail was left.  4:17 PM CSW received voicemail from patient's daughter-in-law, Cheralyn. CSW called Cheralyn and informed her of therapy recommendation of patient discharge to SNF. CSW informed Cheralyn of patient to return to [facility] with home health. Cheralyn expressed patient in resuming services with therapist at ALF but informed CSW that she planned to call ALF regarding options and would notify CSW of SNF vs. ALF decision after. Cheralyn expressed preference in [facility] and [facility] with bed availability. Cheralyn informed CSW that she did not want patient to readmit to [facility]. CSW then inquired about medication assistance. Cheralyn informed CSW that she was unsure of medication assistance needs but that patient gets prescriptions filled at [facility].    Barriers to Discharge: Continued Medical Work up   Patient Goals and CMS Choice            Expected Discharge Plan and Services In-house Referral: Clinical Social Work     Living arrangements for the past 2 months: Assisted Living Facility                                      Prior Living Arrangements/Services Living arrangements for the past 2 months: Assisted Living Facility Lives with:: Facility Resident Patient language and need for interpreter reviewed:: Yes        Need for Family Participation in Patient  Care: Yes (Comment) Care giver support system in place?: Yes (comment)   Criminal Activity/Legal Involvement Pertinent to Current Situation/Hospitalization: No - Comment as needed  Activities of Daily Living   ADL Screening (condition at time of admission) Independently performs ADLs?: No Does the patient have a NEW difficulty with bathing/dressing/toileting/self-feeding that is expected to last >3 days?: Yes (Initiates electronic notice to provider for possible OT consult) Does the patient have a NEW difficulty with getting in/out of bed, walking, or climbing stairs that is expected to last >3 days?: Yes (Initiates electronic notice to provider for possible PT consult) Does the patient have a NEW difficulty with communication that is expected to last >3 days?: Yes (Initiates electronic notice to provider for possible SLP consult) Is the patient deaf or have difficulty hearing?: Yes Does the patient have difficulty seeing, even when wearing glasses/contacts?: Yes Does the patient have difficulty concentrating, remembering, or making decisions?: Yes  Permission Sought/Granted Permission sought to share information with : Family Supports Permission granted to share information with :  (Contact information on chart)  Share Information with NAME: Clarisse Rodriges     Permission granted to share info w Relationship: Son  Permission granted to share info w Contact Information: [phone]  Emotional Assessment   Attitude/Demeanor/Rapport: Unable to Assess Affect (typically observed): Unable to Assess Orientation: : Oriented to Self Alcohol / Substance Use: Not Applicable Psych Involvement: No (  comment)  Admission diagnosis:  Ureterolithiasis [N20.1] Acute encephalopathy [G93.40] AKI (acute kidney injury) (HCC) [N17.9] Septic shock (HCC) [A41.9, R65.21] Diabetic ketoacidosis without coma associated with type 2 diabetes mellitus (HCC) [E11.10] Sepsis due to urinary tract infection (HCC)  [A41.9, N39.0] Patient Active Problem List   Diagnosis Date Noted   Sepsis due to urinary tract infection (HCC) 05/28/2023   AKI (acute kidney injury) (HCC) 05/28/2023   Acute encephalopathy 05/28/2023   Diabetic ketoacidosis without coma associated with type 2 diabetes mellitus (HCC) 05/28/2023   Ureterolithiasis 05/28/2023   Septic shock (HCC) 05/28/2023   Rhabdomyolysis 01/03/2023   Hypothyroid 07/24/2021   Diet-controlled type 2 diabetes mellitus (HCC) 12/30/2015   SBO (small bowel obstruction) (HCC) 01/27/2014   Atrial fibrillation (HCC) 10/28/2013   Palpitations 10/28/2013   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 AT [address] OF [facility] [address] [place] [address] Phone: [phone] Fax: [phone]     Social Drivers of Health (SDOH) Social History: SDOH Screenings   Food Insecurity: No Food Insecurity (05/30/2023)  Housing: Low Risk  (05/30/2023)  Transportation Needs: No Transportation Needs (05/30/2023)  Utilities: Not At Risk (05/30/2023)  Social Connections: Patient Unable To Answer (05/30/2023)  Tobacco Use: Low Risk  (05/28/2023)   SDOH Interventions:     Readmission Risk Interventions     No data to display

## 2023-06-04 NOTE — Progress Notes (Signed)
PROGRESS NOTE  Deanna Shea ZOX:[id number] DOB: [birth year] DOA: 05/28/2023 PCP: Darrow Bussing, MD   LOS: 7 days   Brief narrative:  78 years old female with past medical history of atrial fibrillation, hyperlipidemia, type 2 diabetes, hypothyroidism, anxiety, depression, schizophrenia who initially presented to the hospital from assisted living facility with altered mental status and lethargy.  She was also noted to have hyperglycemia with blood glucose levels in the 500s.  In the ED vitals were stable.  Labs showed hemoglobin of 15.4.  Platelet was low at 121.  Sodium was slightly low at 126 with potassium high at 5.1 with bicarb of 14 and creatinine of 2.4 from baseline 0.61.  Magnesium was low at 1.4 as well.  Troponin was slightly elevated with beta hydroxybutyrate elevation and lactic acid was elevated of 6.5.  Patient was started on broad-spectrum antibiotic for presumed sepsis.  CT head scan was negative for acute findings., CT chest abdomen pelvis showed a 7 mm UVJ calculus with moderate left obstructive hydronephrosis.  There was some gas noted in the bladder and left ureter and collecting system.  Urology was consulted for left ureteral stent placement.  Patient was then admitted to the ICU postoperatively.  Subsequently patient was considered stable for transfer out of ICU to medical service.  Assessment and plan.  Principal Problem:   Sepsis due to urinary tract infection (HCC) Active Problems:   AKI (acute kidney injury) (HCC)   Acute encephalopathy   Diabetic ketoacidosis without coma associated with type 2 diabetes mellitus (HCC)   Ureterolithiasis   Septic shock (HCC)   Severe sepsis with septic shock due to complicated E. coli urinary tract infection with obstructive uropathy,  E. coli bacteremia Lactic acidosis Patient had obstructive uropathy due to obstructing L UVJ calculus status post stent by urology on 05/28/2023. Temperature max of 101.3 F.  F.  On IV cefazolin   for pansensitive E. coli.  Urology following plans for CT scan of the abdomen again today.  Patient however feels clinically well..  WBC at 8.8.  Will continue to monitor.     Acute respiratory insufficiency, postprocedure, resolved Patient was successfully extubated  currently on room air.  No active issues.   Diabetic ketoacidosis DKA has resolved.  Continue on insulin regimen.  Currently on Semglee  13 units twice daily, Continue sliding scale insulin.  Overall glycemic control has improved.  Latest POC glucose of 181.   Acute metabolic/septic encephalopathy Secondary to sepsis and DKA.  Following commands.  Continues to feel better.   AKI, anion gap metabolic acidosis Latest creatinine at 1.0.  Initial creatinine of 2.4.  Has improved at this time.  Patient is taking orally.  Hypomagnesemia Replenished and improved.  Latest magnesium of 1.8.  Hyponatremia Mild.   Latest sodium of 133.  Will continue to monitor.  Hypokalemia Improved after replacement.  Latest potassium of 4.6.  Hypophosphatemia.  Replenished.  Latest phosphorus level of 4.1.  Paroxysmal A-fib On sinus rhythm at this time.  Continue aspirin for prophylaxis.   Hypothyroidism Continue home Synthroid   Depression/Anxiety/Schizophrenia Continue risperidone, Lexapro as clinically appropriate   Thrombocytopenia due to critical illness Improving platelet count 120 from initial 60.  Continue to monitor.   DVT prophylaxis: heparin injection 5,000 Units Start: 05/29/23 0600 SCDs Start: 05/28/23 2155   Disposition: Skilled nursing facility as per PT recommendation  Status is: Inpatient Remains inpatient appropriate because: IV antibiotics, need for rehabilitation, pending clinical improvement, fevers.    Code Status:  Code Status: Full Code  Family Communication:   Spoke with the patient's son on the phone on 06/02/2023  Consultants: PCCM Urology  Procedures: Left ureteric stent placement by  urology 05/28/2023  Anti-infectives:  Cefazolin IV 06/02/2023>  Anti-infectives (From admission, onward)    Start     Dose/Rate Route Frequency Ordered Stop   06/02/23 0600  ceFAZolin (ANCEF) IVPB 2g/100 mL premix        2 g 200 mL/hr over 30 Minutes Intravenous Every 8 hours 06/01/23 1326 06/08/23 0559   05/31/23 0600  cefTRIAXone (ROCEPHIN) 2 g in sodium chloride 0.9 % 100 mL IVPB  Status:  Discontinued        2 g 200 mL/hr over 30 Minutes Intravenous Every 24 hours 05/30/23 0855 06/01/23 1326   05/30/23 0945  cefTRIAXone (ROCEPHIN) 1 g in sodium chloride 0.9 % 100 mL IVPB        1 g 200 mL/hr over 30 Minutes Intravenous  Once 05/30/23 0856 05/30/23 0937   05/29/23 0600  cefTRIAXone (ROCEPHIN) 1 g in sodium chloride 0.9 % 100 mL IVPB  Status:  Discontinued        1 g 200 mL/hr over 30 Minutes Intravenous Every 24 hours 05/28/23 2204 05/30/23 0855   05/28/23 2127  vancomycin (VANCOCIN) 1-5 GM/200ML-% IVPB       Note to Pharmacy: Brien Mates D: cabinet override      05/28/23 2127 05/29/23 0929   05/28/23 2000  vancomycin (VANCOREADY) IVPB 1500 mg/300 mL  Status:  Discontinued        1,500 mg 150 mL/hr over 120 Minutes Intravenous  Once 05/28/23 1957 05/28/23 2203   05/28/23 1900  Vancomycin (VANCOCIN) 1,500 mg in sodium chloride 0.9 % 500 mL IVPB  Status:  Discontinued        1,500 mg 250 mL/hr over 120 Minutes Intravenous  Once 05/28/23 1853 05/28/23 1958   05/28/23 1845  ceFEPIme (MAXIPIME) 2 g in sodium chloride 0.9 % 100 mL IVPB        2 g 200 mL/hr over 30 Minutes Intravenous  Once 05/28/23 1843 05/28/23 1935   05/28/23 1845  metroNIDAZOLE (FLAGYL) IVPB 500 mg        500 mg 100 mL/hr over 60 Minutes Intravenous  Once 05/28/23 1843 05/28/23 2048   05/28/23 1845  vancomycin (VANCOCIN) IVPB 1000 mg/200 mL premix  Status:  Discontinued        1,000 mg 200 mL/hr over 60 Minutes Intravenous  Once 05/28/23 1843 05/28/23 1853       Subjective: Today, patient was seen and  examined at bedside.  Denies any nausea, vomiting, fever, chills or rigor.  Has mild back pain..  Is any urinary symptoms.  Eating okay.  She was however noted to have was noted to have fever of 101.3 F.  Objective: Vitals:   06/04/23 0550 06/04/23 0836  BP: (!) 134/59 (!) 126/50  Pulse: 71 78  Resp: 18 18  Temp: 99 F (37.2 C) 100 F (37.8 C)  SpO2: 96% 96%    Intake/Output Summary (Last 24 hours) at 06/04/2023 1151 Last data filed at 06/04/2023 0545 Gross per 24 hour  Intake 556 ml  Output 2050 ml  Net -1494 ml   Filed Weights   05/29/23 2100 05/30/23 0442 06/01/23 0500  Weight: 77.5 kg 77.5 kg 79.5 kg   Body mass index is 27.45 kg/m.   Physical Exam:  GENERAL: Patient is alert awake Not in obvious distress.  Oriented.  Elderly  female.   afebrile. HENT: No scleral pallor or icterus. Pupils equally reactive to light. Oral mucosa is moist NECK: is supple, no gross swelling noted. CHEST: Clear to auscultation. No crackles or wheezes.  CVS: S1 and S2 heard, no murmur. Regular rate and rhythm.  ABDOMEN: Soft, non-tender, bowel sounds are present. EXTREMITIES: Trace lower extremity edema. CNS: Cranial nerves are intact.  Moves all extremities.   SKIN: warm and dry without rashes.  Data Review: I have personally reviewed the following laboratory data and studies,  CBC: Recent Labs  Lab 05/28/23 1829 05/28/23 1836 05/31/23 0400 06/01/23 0437 06/02/23 0417 06/03/23 0412 06/04/23 0416  WBC 6.7   < > 5.5 6.1 7.6 8.0 8.8  NEUTROABS 5.4  --   --   --   --   --   --   HGB 15.4*   < > 11.2* 11.8* 11.4* 11.5* 12.5  HCT 46.7*   < > 33.6* 34.8* 34.0* 34.2* 38.1  MCV 88.8   < > 88.0 87.2 87.9 87.2 90.3  PLT 121*   < > 60* 82* 102* 153 120*   < > = values in this interval not displayed.   Basic Metabolic Panel: Recent Labs  Lab 05/29/23 0429 05/29/23 0859 05/31/23 0400 06/01/23 0437 06/02/23 0417 06/03/23 0412 06/04/23 0416  NA 130*   < > 133* 138 138 138 133*  K  4.0   < > 3.3* 3.7 3.5 3.3* 4.6  CL 101   < > 99 105 106 105 103  CO2 18*   < > 23 24 23 25  20*  GLUCOSE 274*   < > 238* 174* 112* 115* 145*  BUN 30*   < > 21 22 22 21 23   CREATININE 1.76*   < > 1.38* 1.18* 1.15* 0.98 1.01*  CALCIUM 7.7*   < > 7.7* 8.7* 7.9* 7.5* 7.5*  MG 1.8  --  1.8 1.7 1.9 1.8 1.8  PHOS 2.6  --   --  1.6*  --  4.1  --    < > = values in this interval not displayed.   Liver Function Tests: Recent Labs  Lab 05/28/23 1829 06/02/23 0417  AST 49* 42*  ALT 40 30  ALKPHOS 57 133*  BILITOT 1.5* 0.8  PROT 6.5 4.7*  ALBUMIN 3.3* 1.7*   No results for input(s): "LIPASE", "AMYLASE" in the last 168 hours. Recent Labs  Lab 05/28/23 1820  AMMONIA 19   Cardiac Enzymes: Recent Labs  Lab 05/28/23 1829  CKTOTAL 150   BNP (last 3 results) Recent Labs    05/28/23 1820 06/03/23 0412  BNP 89.3 160.2*    ProBNP (last 3 results) No results for input(s): "PROBNP" in the last 8760 hours.  CBG: Recent Labs  Lab 06/03/23 0848 06/03/23 1216 06/03/23 1633 06/03/23 2127 06/04/23 0837  GLUCAP 122* 137* 148* 232* 181*   Recent Results (from the past 240 hours)  Blood Culture (routine x 2)     Status: Abnormal   Collection Time: 05/28/23  6:17 PM   Specimen: BLOOD  Result Value Ref Range Status   Specimen Description BLOOD SITE NOT SPECIFIED  Final   Special Requests   Final    BOTTLES DRAWN AEROBIC AND ANAEROBIC Blood Culture results may not be optimal due to an inadequate volume of blood received in culture bottles   Culture  Setup Time   Final    GRAM NEGATIVE RODS IN BOTH AEROBIC AND ANAEROBIC BOTTLES CRITICAL RESULT CALLED TO, READ BACK  BY AND VERIFIED WITH: Gertie Fey [phone] [id number] FCP Performed at [facility], [address] [place], [place] [address]    Culture ESCHERICHIA COLI (A)  Final   Report Status 05/31/2023 FINAL  Final   Organism ID, Bacteria ESCHERICHIA COLI  Final   Organism ID, Bacteria ESCHERICHIA COLI  Final       Susceptibility   Escherichia coli - KIRBY BAUER*    CEFAZOLIN SENSITIVE Sensitive    Escherichia coli - MIC*    AMPICILLIN <=2 SENSITIVE Sensitive     CEFEPIME <=0.12 SENSITIVE Sensitive     CEFTAZIDIME <=1 SENSITIVE Sensitive     CEFTRIAXONE <=0.25 SENSITIVE Sensitive     CIPROFLOXACIN <=0.25 SENSITIVE Sensitive     GENTAMICIN <=1 SENSITIVE Sensitive     IMIPENEM <=0.25 SENSITIVE Sensitive     TRIMETH/SULFA <=20 SENSITIVE Sensitive     AMPICILLIN/SULBACTAM <=2 SENSITIVE Sensitive     PIP/TAZO <=4 SENSITIVE Sensitive ug/mL    * ESCHERICHIA COLI    ESCHERICHIA COLI  Blood Culture ID Panel (Reflexed)     Status: Abnormal   Collection Time: 05/28/23  6:17 PM  Result Value Ref Range Status   Enterococcus faecalis NOT DETECTED NOT DETECTED Final   Enterococcus Faecium NOT DETECTED NOT DETECTED Final   Listeria monocytogenes NOT DETECTED NOT DETECTED Final   Staphylococcus species NOT DETECTED NOT DETECTED Final   Staphylococcus aureus (BCID) NOT DETECTED NOT DETECTED Final   Staphylococcus epidermidis NOT DETECTED NOT DETECTED Final   Staphylococcus lugdunensis NOT DETECTED NOT DETECTED Final   Streptococcus species NOT DETECTED NOT DETECTED Final   Streptococcus agalactiae NOT DETECTED NOT DETECTED Final   Streptococcus pneumoniae NOT DETECTED NOT DETECTED Final   Streptococcus pyogenes NOT DETECTED NOT DETECTED Final   A.calcoaceticus-baumannii NOT DETECTED NOT DETECTED Final   Bacteroides fragilis NOT DETECTED NOT DETECTED Final   Enterobacterales DETECTED (A) NOT DETECTED Final    Comment: Enterobacterales represent a large order of gram negative bacteria, not a single organism. CRITICAL RESULT CALLED TO, READ BACK BY AND VERIFIED WITH: PHARMD BAILEY [phone] [id number] FCP    Enterobacter cloacae complex NOT DETECTED NOT DETECTED Final   Escherichia coli DETECTED (A) NOT DETECTED Final    Comment: CRITICAL RESULT CALLED TO, READ BACK BY AND VERIFIED WITH: PHARMD BAILEY A [phone]  [id number] FCP    Klebsiella aerogenes NOT DETECTED NOT DETECTED Final   Klebsiella oxytoca NOT DETECTED NOT DETECTED Final   Klebsiella pneumoniae NOT DETECTED NOT DETECTED Final   Proteus species NOT DETECTED NOT DETECTED Final   Salmonella species NOT DETECTED NOT DETECTED Final   Serratia marcescens NOT DETECTED NOT DETECTED Final   Haemophilus influenzae NOT DETECTED NOT DETECTED Final   Neisseria meningitidis NOT DETECTED NOT DETECTED Final   Pseudomonas aeruginosa NOT DETECTED NOT DETECTED Final   Stenotrophomonas maltophilia NOT DETECTED NOT DETECTED Final   Candida albicans NOT DETECTED NOT DETECTED Final   Candida auris NOT DETECTED NOT DETECTED Final   Candida glabrata NOT DETECTED NOT DETECTED Final   Candida krusei NOT DETECTED NOT DETECTED Final   Candida parapsilosis NOT DETECTED NOT DETECTED Final   Candida tropicalis NOT DETECTED NOT DETECTED Final   Cryptococcus neoformans/gattii NOT DETECTED NOT DETECTED Final   CTX-M ESBL NOT DETECTED NOT DETECTED Final   Carbapenem resistance IMP NOT DETECTED NOT DETECTED Final   Carbapenem resistance KPC NOT DETECTED NOT DETECTED Final   Carbapenem resistance NDM NOT DETECTED NOT DETECTED Final   Carbapenem resist OXA 48 LIKE NOT DETECTED  NOT DETECTED Final   Carbapenem resistance VIM NOT DETECTED NOT DETECTED Final    Comment: Performed at [facility], [address], [place], [place] [address]  Resp panel by RT-PCR (RSV, Flu A&B, Covid) Anterior Nasal Swab     Status: None   Collection Time: 05/28/23  6:18 PM   Specimen: Anterior Nasal Swab  Result Value Ref Range Status   SARS Coronavirus 2 by RT PCR NEGATIVE NEGATIVE Final   Influenza A by PCR NEGATIVE NEGATIVE Final   Influenza B by PCR NEGATIVE NEGATIVE Final    Comment: (NOTE) The Xpert Xpress SARS-CoV-2/FLU/RSV plus assay is intended as an aid in the diagnosis of influenza from Nasopharyngeal swab specimens and should not be used as a sole basis for treatment. Nasal  washings and aspirates are unacceptable for Xpert Xpress SARS-CoV-2/FLU/RSV testing.  Fact Sheet for Patients: BloggerCourse.com  Fact Sheet for Healthcare Providers: SeriousBroker.it  This test is not yet approved or cleared by the [place] FDA and has been authorized for detection and/or diagnosis of SARS-CoV-2 by FDA under an Emergency Use Authorization (EUA). This EUA will remain in effect (meaning this test can be used) for the duration of the COVID-19 declaration under Section 564(b)(1) of the Act, 21 U.S.C. section 360bbb-3(b)(1), unless the authorization is terminated or revoked.     Resp Syncytial Virus by PCR NEGATIVE NEGATIVE Final    Comment: (NOTE) Fact Sheet for Patients: BloggerCourse.com  Fact Sheet for Healthcare Providers: SeriousBroker.it  This test is not yet approved or cleared by the [place] FDA and has been authorized for detection and/or diagnosis of SARS-CoV-2 by FDA under an Emergency Use Authorization (EUA). This EUA will remain in effect (meaning this test can be used) for the duration of the COVID-19 declaration under Section 564(b)(1) of the Act, 21 U.S.C. section 360bbb-3(b)(1), unless the authorization is terminated or revoked.  Performed at [facility], [address], [place], [place] [address]   Blood Culture (routine x 2)     Status: Abnormal   Collection Time: 05/28/23  7:00 PM   Specimen: BLOOD  Result Value Ref Range Status   Specimen Description BLOOD SITE NOT SPECIFIED  Final   Special Requests   Final    BOTTLES DRAWN AEROBIC AND ANAEROBIC Blood Culture results may not be optimal due to an inadequate volume of blood received in culture bottles   Culture  Setup Time   Final    GRAM NEGATIVE RODS IN BOTH AEROBIC AND ANAEROBIC BOTTLES CRITICAL VALUE NOTED.  VALUE IS CONSISTENT WITH PREVIOUSLY REPORTED AND CALLED  VALUE.    Culture (A)  Final    ESCHERICHIA COLI SUSCEPTIBILITIES PERFORMED ON PREVIOUS CULTURE WITHIN THE LAST 5 DAYS. Performed at [facility], [address], [place], [place] [address]    Report Status 05/31/2023 FINAL  Final  Urine Culture     Status: Abnormal   Collection Time: 05/28/23 10:00 PM   Specimen: Urine, Cystoscope  Result Value Ref Range Status   Specimen Description CYSTOSCOPY  Final   Special Requests   Final    NONE Performed at [facility], [address] [place], [place] [address]    Culture >=100,000 COLONIES/mL ESCHERICHIA COLI (A)  Final   Report Status 05/30/2023 FINAL  Final   Organism ID, Bacteria ESCHERICHIA COLI (A)  Final      Susceptibility   Escherichia coli - MIC*    AMPICILLIN 4 SENSITIVE Sensitive  CEFEPIME <=0.12 SENSITIVE Sensitive     CEFTAZIDIME <=1 SENSITIVE Sensitive     CEFTRIAXONE <=0.25 SENSITIVE Sensitive     CIPROFLOXACIN <=0.25 SENSITIVE Sensitive     GENTAMICIN <=1 SENSITIVE Sensitive     IMIPENEM <=0.25 SENSITIVE Sensitive     TRIMETH/SULFA <=20 SENSITIVE Sensitive     AMPICILLIN/SULBACTAM <=2 SENSITIVE Sensitive     PIP/TAZO <=4 SENSITIVE Sensitive ug/mL    * >=100,000 COLONIES/mL ESCHERICHIA COLI  MRSA Next Gen by PCR, Nasal     Status: None   Collection Time: 05/28/23 10:04 PM   Specimen: Nasal Mucosa; Nasal Swab  Result Value Ref Range Status   MRSA by PCR Next Gen NOT DETECTED NOT DETECTED Final    Comment: (NOTE) The GeneXpert MRSA Assay (FDA approved for NASAL specimens only), is one component of a comprehensive MRSA colonization surveillance program. It is not intended to diagnose MRSA infection nor to guide or monitor treatment for MRSA infections. Test performance is not FDA approved in patients less than 76 years old. Performed at [facility], [address] [place], [place] [address]   Culture, blood (Routine X 2) w Reflex to ID Panel     Status: None (Preliminary result)    Collection Time: 06/02/23  9:18 PM   Specimen: BLOOD LEFT HAND  Result Value Ref Range Status   Specimen Description BLOOD LEFT HAND  Final   Special Requests   Final    BOTTLES DRAWN AEROBIC AND ANAEROBIC Blood Culture results may not be optimal due to an inadequate volume of blood received in culture bottles   Culture   Final    NO GROWTH 2 DAYS Performed at [facility], [address] [place], [place] [address]    Report Status PENDING  Incomplete  Culture, blood (Routine X 2) w Reflex to ID Panel     Status: None (Preliminary result)   Collection Time: 06/02/23  9:25 PM   Specimen: BLOOD LEFT HAND  Result Value Ref Range Status   Specimen Description BLOOD LEFT HAND  Final   Special Requests   Final    BOTTLES DRAWN AEROBIC AND ANAEROBIC Blood Culture results may not be optimal due to an inadequate volume of blood received in culture bottles   Culture   Final    NO GROWTH 2 DAYS Performed at [facility], [address] [place], [place] [address]    Report Status PENDING  Incomplete     Studies: DG Chest Port 1 View Result Date: 06/02/2023 CLINICAL DATA:  [id number] Fever [id number] EXAM: PORTABLE CHEST 1 VIEW COMPARISON:  Chest x-ray 05/28/2023 FINDINGS: The heart and mediastinal contours are grossly unchanged given AP portable technique. Low lung volumes. No focal consolidation. Mild pulmonary edema. Likely bilateral trace pleural effusions. No pneumothorax. No acute osseous abnormality. IMPRESSION: Low lung volumes with mild pulmonary edema and likely bilateral trace pleural effusions. Electronically Signed   By: Tish Frederickson M.D.   On: 06/02/2023 21:43      Joycelyn Das, MD  [facility] 06/04/2023  If 7PM-7AM, please contact night-coverage

## 2023-06-04 NOTE — Progress Notes (Signed)
Occupational Therapy Treatment Patient Details Name: Deanna Shea MRN: [id number] DOB: 03/10/1946 Today's Date: 06/04/2023   History of present illness 78 year old woman who presented to [facility] ED 1/20 for AMS and lethargy.  Found to have ?DKA, urosepsis.ObstructedL UVJ calculus with obstructive uropathy. Uro consulted. Taken to OR  05/28/23 for L ureteral stent. PMHx significant for Afib (on ASA/digoxin/propranolol), mitral valve prolapse, HLD, T2DM, nephrolithiasis, hypothyroidism, anxiety/depression, schizophrenia.   OT comments  Pt seen at request of NT asking for assist with transfer back to bed. Pt oriented to location and situation with increased time. Pleasant throughout but not conversational. Pt following one step commands needing multimodal cues for sequencing transfer. Good power up from bari recliner with min A-mod A and taking steps toward bed with mod cues progressing to min cues. Will continue to follow. Patient will benefit from continued inpatient follow up therapy, <3 hours/day       If plan is discharge home, recommend the following:  Two people to help with walking and/or transfers;A lot of help with bathing/dressing/bathroom;Assistance with cooking/housework;Assistance with feeding;Assist for transportation;Help with stairs or ramp for entrance;Supervision due to cognitive status;Direct supervision/assist for financial management;Direct supervision/assist for medications management   Equipment Recommendations  Other (comment) (defer to next venue of care)    Recommendations for Other Services      Precautions / Restrictions Precautions Precautions: Fall Precaution Comments: slowed responses, but will participate Restrictions Weight Bearing Restrictions Per Provider Order: No       Mobility Bed Mobility Overal bed mobility: Needs Assistance Bed Mobility: Sit to Supine       Sit to supine: Mod assist   General bed mobility comments: to bring BLE into bed     Transfers Overall transfer level: Needs assistance Equipment used: Rolling walker (2 wheels) Transfers: Sit to/from Stand, Bed to chair/wheelchair/BSC Sit to Stand: +2 safety/equipment, +2 physical assistance, Min assist Stand pivot transfers: Mod assist         General transfer comment: multimodal cues and blocking of LLE to initiate movement toward bed on R with RLE     Balance Overall balance assessment: Needs assistance Sitting-balance support: Feet supported, Bilateral upper extremity supported Sitting balance-Leahy Scale: Poor     Standing balance support: Bilateral upper extremity supported Standing balance-Leahy Scale: Poor                             ADL either performed or assessed with clinical judgement   ADL Overall ADL's : Needs assistance/impaired                         Toilet Transfer: Moderate assistance;+2 for safety/equipment;+2 for physical assistance;Stand-pivot;Rolling walker (2 wheels);Minimal assistance Toilet Transfer Details (indicate cue type and reason): STS with +2 min-mod A; +2 mod A with OT blocking LLE for pt to iniate movement on R iniaitlly progressing to min A. Pt observed to premature sit                Extremity/Trunk Assessment              Vision   Additional Comments: continue to assess   Perception     Praxis      Cognition Arousal: Alert Behavior During Therapy: Flat affect Overall Cognitive Status: Impaired/Different from baseline Area of Impairment: Orientation, Following commands, Safety/judgement, Problem solving, Attention, Awareness  Current Attention Level: Sustained   Following Commands: Follows one step commands inconsistently, Follows one step commands with increased time Safety/Judgement: Decreased awareness of safety, Decreased awareness of deficits Awareness: Emergent Problem Solving: Slow processing, Decreased initiation, Difficulty sequencing,  Requires verbal cues, Requires tactile cues General Comments: Pt aroused able to follow one step commands and report to OT why she is here. pt needing step by step cues for transfer back to bed        Exercises      Shoulder Instructions       General Comments      Pertinent Vitals/ Pain       Pain Assessment Pain Assessment: No/denies pain  Home Living                                          Prior Functioning/Environment              Frequency  Min 1X/week        Progress Toward Goals  OT Goals(current goals can now be found in the care plan section)  Progress towards OT goals: Progressing toward goals  Acute Rehab OT Goals Patient Stated Goal: move more OT Goal Formulation: With patient Time For Goal Achievement: 06/15/23 Potential to Achieve Goals: Fair ADL Goals Pt Will Perform Lower Body Bathing: with mod assist;sitting/lateral leans;sit to/from stand Pt Will Perform Lower Body Dressing: with mod assist;sit to/from stand;sitting/lateral leans Pt Will Transfer to Toilet: with mod assist;bedside commode;stand pivot transfer Pt Will Perform Toileting - Clothing Manipulation and hygiene: with mod assist;sitting/lateral leans;sit to/from stand Additional ADL Goal #1: Patient will complete bed mobility at mod A level as a precursor to OOB activities. Additional ADL Goal #2: Patient will be able to follow 1-2 step commands consistently as a precursor to higher level ADL activities.  Plan      Co-evaluation                 AM-PAC OT "6 Clicks" Daily Activity     Outcome Measure   Help from another person eating meals?: A Lot Help from another person taking care of personal grooming?: A Lot Help from another person toileting, which includes using toliet, bedpan, or urinal?: A Lot Help from another person bathing (including washing, rinsing, drying)?: A Lot Help from another person to put on and taking off regular upper body  clothing?: A Lot Help from another person to put on and taking off regular lower body clothing?: Total 6 Click Score: 11    End of Session Equipment Utilized During Treatment: Gait belt;Rolling walker (2 wheels)  OT Visit Diagnosis: Unsteadiness on feet (R26.81);Other abnormalities of gait and mobility (R26.89);Muscle weakness (generalized) (M62.81);Other symptoms and signs involving cognitive function   Activity Tolerance Patient limited by fatigue;Patient limited by lethargy   Patient Left in bed;with call bell/phone within reach;with nursing/sitter in room   Nurse Communication Mobility status        Time: 1701-1711 OT Time Calculation (min): 10 min  Charges: OT General Charges $OT Visit: 1 Visit OT Treatments $Self Care/Home Management : 8-22 mins  Tyler Deis, OTR/L [facility] Acute Rehabilitation Office: [phone]   Deanna Shea 06/04/2023, 5:27 PM

## 2023-06-04 NOTE — Plan of Care (Signed)
Problem: Clinical Measurements: Goal: Diagnostic test results will improve Outcome: Not Progressing Goal: Respiratory complications will improve Outcome: Not Progressing

## 2023-06-04 NOTE — Progress Notes (Signed)
   7 Days Post-Op Subjective: No acute events overnight.  Patient confirms cyclical nocturnal fever and is in agreement with plan for reimage.  Objective: Vital signs in last 24 hours: Temp:  [98.8 F (37.1 C)-101.3 F (38.5 C)] 100 F (37.8 C) (01/27 0836) Pulse Rate:  [67-78] 78 (01/27 0836) Resp:  [17-18] 18 (01/27 0836) BP: (118-138)/(48-63) 126/50 (01/27 0836) SpO2:  [92 %-96 %] 96 % (01/27 0836)  Assessment/Plan: #sepsis #emphysematous pyelitis  #left ureteral stone  To the OR with Dr. Jennette Bill on 05/28/23 for urgent ureteral stent placement.  Outpatient follow-up for definitive stone management.  URS w/ LL Hemodynamically stable and on the floor.  Still having cyclical fever.  CT A/P with contrast to assess for development of abscess. Trend labs.  Serum creatinine slightly above baseline Will follow  Intake/Output from previous day: 01/26 0701 - 01/27 0700 In: 1006 [P.O.:1006] Out: 2050 [Urine:2050]  Intake/Output this shift: No intake/output data recorded.  Physical Exam:  General: Alert and oriented CV: No cyanosis Lungs: equal chest rise Abdomen: Soft, NTND, no rebound or guarding Gu: foley out  Lab Results: Recent Labs    06/02/23 0417 06/03/23 0412 06/04/23 0416  HGB 11.4* 11.5* 12.5  HCT 34.0* 34.2* 38.1   BMET Recent Labs    06/03/23 0412 06/04/23 0416  NA 138 133*  K 3.3* 4.6  CL 105 103  CO2 25 20*  GLUCOSE 115* 145*  BUN 21 23  CREATININE 0.98 1.01*  CALCIUM 7.5* 7.5*     Studies/Results: DG Chest Port 1 View Result Date: 06/02/2023 CLINICAL DATA:  [id number] Fever [id number] EXAM: PORTABLE CHEST 1 VIEW COMPARISON:  Chest x-ray 05/28/2023 FINDINGS: The heart and mediastinal contours are grossly unchanged given AP portable technique. Low lung volumes. No focal consolidation. Mild pulmonary edema. Likely bilateral trace pleural effusions. No pneumothorax. No acute osseous abnormality. IMPRESSION: Low lung volumes with mild pulmonary edema  and likely bilateral trace pleural effusions. Electronically Signed   By: Tish Frederickson M.D.   On: 06/02/2023 21:43      LOS: 7 days   Elmon Kirschner, NP [facility] Pager: [phone]  06/04/2023, 12:05 PM

## 2023-06-04 NOTE — Progress Notes (Signed)
Physical Therapy Treatment Patient Details Name: Deanna Shea MRN: [id number] DOB: 02/26/1946 Today's Date: 06/04/2023   History of Present Illness 78 year old woman who presented to [facility] ED 1/20 for AMS and lethargy.  Found to have ?DKA, urosepsis.ObstructedL UVJ calculus with obstructive uropathy. Uro consulted. Taken to OR  05/28/23 for L ureteral stent. PMHx significant for Afib (on ASA/digoxin/propranolol), mitral valve prolapse, HLD, T2DM, nephrolithiasis, hypothyroidism, anxiety/depression, schizophrenia.    PT Comments  Continuing work on functional mobility and activity tolerance;  Session focused on functional transfers, and pt is making modest, but present, progress towards goals; showed in standing to[place] today with use of the stedy as a transfer assist and standing assist frame; the stedy bar in front of pt was useful in decreasing pt's fear of falling forward, and she was able to perform multple reps of sit<>stand with 2 person assist; Patient will benefit from continued inpatient follow up therapy, <3 hours/day     If plan is discharge home, recommend the following: A lot of help with bathing/dressing/bathroom;Two people to help with walking and/or transfers;Assistance with cooking/housework;Direct supervision/assist for medications management;Direct supervision/assist for financial management;Assist for transportation;Supervision due to cognitive status   Can travel by private vehicle     No  Equipment Recommendations   (TBD next venue of care)    Recommendations for Other Services       Precautions / Restrictions Precautions Precautions: Fall Precaution Comments: slowed responses, but will participate Restrictions Weight Bearing Restrictions Per Provider Order: No     Mobility  Bed Mobility Overal bed mobility: Needs Assistance Bed Mobility: Supine to Sit     Supine to sit: Max assist     General bed mobility comments: More efficient movement to come to EOB  with 2 person assist; heavy use of bed pads; noting some anticipatory trunk movemetns as we worked on reciprocally scooting to EOB    Transfers Overall transfer level: Needs assistance Equipment used: Ambulation equipment used Transfers: Sit to/from Stand, Bed to chair/wheelchair/BSC Sit to Stand: Max assist, Mod assist           General transfer comment: Needing encouragement and gentle tactile cueing to incr anterior lean to hold the stedy bar to pull up; Needed assist with hygeine in standing for BM; light mod assist to stand from higher stedy seat; stood from stedy seat x 4 with good initiation; fatigues in standing, but making progress Transfer via Lift Equipment: Stedy  Ambulation/Gait                   Stairs             Wheelchair Mobility     Tilt Bed    Modified Rankin (Stroke Patients Only)       Balance     Sitting balance-Leahy Scale: Poor (approaching Fair)       Standing balance-Leahy Scale: Poor                              Cognition Arousal: Alert Behavior During Therapy: Flat affect Overall Cognitive Status: Impaired/Different from baseline Area of Impairment: Orientation, Following commands, Safety/judgement, Problem solving, Attention, Awareness                   Current Attention Level: Sustained   Following Commands: Follows one step commands inconsistently, Follows one step commands with increased time   Awareness: Emergent Problem Solving: Slow processing, Decreased initiation, Difficulty sequencing, Requires verbal cues,  Requires tactile cues          Exercises      General Comments        Pertinent Vitals/Pain Pain Assessment Pain Assessment: No/denies pain Pain Intervention(s): Monitored during session    Home Living                          Prior Function            PT Goals (current goals can now be found in the care plan section) Acute Rehab PT Goals Patient Stated  Goal: Get well PT Goal Formulation: With patient Time For Goal Achievement: 06/14/23 Potential to Achieve Goals: Good Progress towards PT goals: Progressing toward goals    Frequency    Min 1X/week      PT Plan      Co-evaluation              AM-PAC PT "6 Clicks" Mobility   Outcome Measure  Help needed turning from your back to your side while in a flat bed without using bedrails?: A Little Help needed moving from lying on your back to sitting on the side of a flat bed without using bedrails?: A Lot Help needed moving to and from a bed to a chair (including a wheelchair)?: A Lot Help needed standing up from a chair using your arms (e.g., wheelchair or bedside chair)?: A Lot Help needed to walk in hospital room?: Total Help needed climbing 3-5 steps with a railing? : Total 6 Click Score: 11    End of Session Equipment Utilized During Treatment: Gait belt Activity Tolerance: Patient tolerated treatment well (Weakness) Patient left: in chair;with call bell/phone within reach Nurse Communication: Mobility status;Need for lift equipment (+2 with RW, vs Stedy) PT Visit Diagnosis: Unsteadiness on feet (R26.81);Other symptoms and signs involving the nervous system (R29.898);Difficulty in walking, not elsewhere classified (R26.2);History of falling (Z91.81);Muscle weakness (generalized) (M62.81)     Time: 1610-9604 PT Time Calculation (min) (ACUTE ONLY): 34 min  Charges:    $Therapeutic Activity: 23-37 mins PT General Charges $$ ACUTE PT VISIT: 1 Visit                     Van Clines, PT  Acute Rehabilitation Services Office [phone] Secure Chat welcomed    Levi Aland 06/04/2023, 1:56 PM

## 2023-06-05 DIAGNOSIS — N390 Urinary tract infection, site not specified: Secondary | ICD-10-CM | POA: Diagnosis not present

## 2023-06-05 DIAGNOSIS — A419 Sepsis, unspecified organism: Secondary | ICD-10-CM | POA: Diagnosis not present

## 2023-06-05 LAB — GLUCOSE, CAPILLARY
Glucose-Capillary: 160 mg/dL — ABNORMAL HIGH (ref 70–99)
Glucose-Capillary: 129 mg/dL — ABNORMAL HIGH (ref 70–99)
Glucose-Capillary: 110 mg/dL — ABNORMAL HIGH (ref 70–99)
Glucose-Capillary: 186 mg/dL — ABNORMAL HIGH (ref 70–99)

## 2023-06-05 LAB — PROTIME-INR
INR: 1.1 (ref 0.8–1.2)
Prothrombin Time: 14.3 s (ref 11.4–15.2)

## 2023-06-05 MED ORDER — VANCOMYCIN HCL 1500 MG/300ML IV SOLN
1500.0000 mg | INTRAVENOUS | Status: DC
  Administered 2023-06-05 – 2023-06-06 (×2): 1500 mg via INTRAVENOUS
  Filled 2023-06-05 (×3): qty 300

## 2023-06-05 MED ORDER — SODIUM CHLORIDE 0.9 % IV SOLN
2.0000 g | Freq: Three times a day (TID) | INTRAVENOUS | Status: DC
  Administered 2023-06-05 – 2023-06-07 (×7): 2 g via INTRAVENOUS
  Filled 2023-06-05 (×7): qty 12.5

## 2023-06-05 NOTE — Progress Notes (Signed)
Nutrition Follow-up  DOCUMENTATION CODES:   Not applicable  INTERVENTION:  When diet advanced again after procedure Liberalize diet Continue with Ensure Plus High Protein po BID, each supplement provides 350 kcal and 20 grams of protein. Continue with MVM Discontinue Glucerna   NUTRITION DIAGNOSIS:   Inadequate oral intake related to inability to eat as evidenced by NPO status.  Improving with intervention in place.  GOAL:   Patient will meet greater than or equal to 90% of their needs    MONITOR:   PO intake, Supplement acceptance, Labs, Weight trends  REASON FOR ASSESSMENT:   Ventilator    ASSESSMENT:   Pt with hx of atrial fibrillation, DM type 2, and hx of SBO requiring resection presented to ED from her ALF with AMS and fatigue. Workup in ED consistent with severe sepsis due to UTI. CT abdomen pelvis shows small abscesses < 4cm. Based on DM creating immunocompromised state recommend IR assessment to see if largest abscess can be drained.  Noted to have IR tomorrow currently NPO.  Appetite noted to be fair to good.  Patient in bed with eyes closed.  Stated that she has not had any improvement on oral intake, and that she is not receiving her supplements. Reports that she follows a restrictive diet at home but would prefer to have a more liberal diet while in hospital setting. Addressed supplements with RN. There is no benefit to excessive dietary restrictions related advanced age, increased nutrient needs. Patient would benefit better from liberalized diet to help increased needs and promote better oral intake.  Hospital weight history: 06/01/23 0500 79.5 kg 175.27 lbs  05/30/23 0442 77.5 kg 170.86 lbs  05/29/23 2100 77.5 kg 170.86 lbs  05/29/23 0122 79.2 kg 174.6 lbs  05/28/23 2331 79.2 kg 174.6 lbs  05/28/23 2202 73.9 kg 162.92 lbs    Average Meal Intake: Currently NPO previous documentation.  25-50: 45% intake x 5 recorded meals  Nutritionally Relevant  Medications: Scheduled Meds:  feeding supplement  237 mL Oral BID BM   feeding supplement (GLUCERNA SHAKE)  237 mL Oral BID BM   multivitamin with minerals  1 tablet Oral Daily   risperiDONE  3 mg Oral QHS    Labs Reviewed  NUTRITION - FOCUSED PHYSICAL EXAM:  Flowsheet Row Most Recent Value  Orbital Region Mild depletion  Upper Arm Region No depletion  Thoracic and Lumbar Region No depletion  Buccal Region No depletion  Temple Region No depletion  Clavicle Bone Region No depletion  Clavicle and Acromion Bone Region No depletion  Scapular Bone Region No depletion  Dorsal Hand No depletion  Patellar Region No depletion  Anterior Thigh Region No depletion  Posterior Calf Region No depletion  Edema (RD Assessment) Severe  [generalized, BLE, BUE]  Hair Reviewed  Critias.Bumpers thin]  Eyes Reviewed  Mouth Reviewed  Skin Reviewed  Nails Reviewed       Diet Order:   Diet Order             Diet NPO time specified  Diet effective now                   EDUCATION NEEDS:   Not appropriate for education at this time  Skin:  Skin Assessment: Reviewed RN Assessment  Last BM:  1/22 - type 5  Height:   Ht Readings from Last 1 Encounters:  05/28/23 5\' 7"  (1.702 m)    Weight:   Wt Readings from Last 1 Encounters:  06/01/23 79.5  kg    Ideal Body Weight:  61.4 kg  BMI:  Body mass index is 27.45 kg/m.  Estimated Nutritional Needs:   Kcal:  1600-1800 kcal/d  Protein:  75-90g/d  Fluid:  >/=1.8L/d

## 2023-06-05 NOTE — Care Management Important Message (Signed)
Important Message  Patient Details  Name: Deanna Shea MRN: [id number] Date of Birth: Feb 02, 1946   Important Message Given:  Yes - Medicare IM     Dorena Bodo 06/05/2023, 4:27 PM

## 2023-06-05 NOTE — TOC CM/SW Note (Signed)
RE: Deanna Shea Date of Birth: 1945/09/09 Date: 06/05/2023  Please be advised that the above-named patient will require a short-term nursing home stay - anticipated 30 days or less for rehabilitation and strengthening.  The plan is for return home.  Antonietta Breach, MSW, LCSW-A Transitions of Care  Clinical Social Worker I [phone]

## 2023-06-05 NOTE — Plan of Care (Signed)
Problem: Clinical Measurements: Goal: Diagnostic test results will improve Outcome: Not Progressing Goal: Respiratory complications will improve Outcome: Not Progressing

## 2023-06-05 NOTE — Plan of Care (Signed)
Patient alert/oriented X3. Patient compliant with medication administration and tolerated IV antibiotics. Patient was transferred and was up in the chair for a couple hours. VSS, no complaints at this time.   Problem: Clinical Measurements: Goal: Diagnostic test results will improve Outcome: Progressing   Problem: Clinical Measurements: Goal: Respiratory complications will improve Outcome: Progressing   Problem: Clinical Measurements: Goal: Cardiovascular complication will be avoided Outcome: Progressing   Problem: Activity: Goal: Risk for activity intolerance will decrease Outcome: Progressing   Problem: Nutrition: Goal: Adequate nutrition will be maintained Outcome: Progressing   Problem: Coping: Goal: Level of anxiety will decrease Outcome: Progressing   Problem: Elimination: Goal: Will not experience complications related to urinary retention Outcome: Progressing   Problem: Pain Managment: Goal: General experience of comfort will improve and/or be controlled Outcome: Progressing   Problem: Safety: Goal: Ability to remain free from injury will improve Outcome: Progressing   Problem: Skin Integrity: Goal: Risk for impaired skin integrity will decrease Outcome: Progressing   Problem: Activity: Goal: Ability to tolerate increased activity will improve Outcome: Progressing   Problem: Respiratory: Goal: Ability to maintain a clear airway and adequate ventilation will improve Outcome: Progressing   Problem: Education: Goal: Ability to describe self-care measures that may prevent or decrease complications (Diabetes Survival Skills Education) will improve Outcome: Progressing   Problem: Education: Goal: Individualized Educational Video(s) Outcome: Progressing   Problem: Coping: Goal: Ability to adjust to condition or change in health will improve Outcome: Progressing   Problem: Fluid Volume: Goal: Ability to maintain a balanced intake and output will  improve Outcome: Progressing   Problem: Health Behavior/Discharge Planning: Goal: Ability to identify and utilize available resources and services will improve Outcome: Progressing   Problem: Health Behavior/Discharge Planning: Goal: Ability to manage health-related needs will improve Outcome: Progressing   Problem: Metabolic: Goal: Ability to maintain appropriate glucose levels will improve Outcome: Progressing   Problem: Nutritional: Goal: Maintenance of adequate nutrition will improve Outcome: Progressing   Problem: Nutritional: Goal: Progress toward achieving an optimal weight will improve Outcome: Progressing   Problem: Skin Integrity: Goal: Risk for impaired skin integrity will decrease Outcome: Progressing   Problem: Tissue Perfusion: Goal: Adequacy of tissue perfusion will improve Outcome: Progressing

## 2023-06-05 NOTE — Progress Notes (Signed)
PROGRESS NOTE  Deanna Shea EXB:[id number] DOB: 1945/12/25 DOA: 05/28/2023 PCP: Darrow Bussing, MD   LOS: 8 days   Brief narrative:  78 years old female with past medical history of atrial fibrillation, hyperlipidemia, type 2 diabetes, hypothyroidism, anxiety, depression, schizophrenia who initially presented to the hospital from assisted living facility with altered mental status and lethargy.  She was also noted to have hyperglycemia with blood glucose levels in the 500s.  In the ED, vitals were stable.  Labs showed hyponatremia, hypokalemia with elevated creatinine and hypomagnesemia.  Troponin was slightly elevated with beta hydroxybutyrate elevation and lactic acid was elevated of 6.5.  Patient was started on broad-spectrum antibiotic for presumed sepsis. CT chest abdomen pelvis showed a 7 mm UVJ calculus with moderate left obstructive hydronephrosis.  There was some gas noted in the bladder and left ureter and collecting system.  Urology was consulted for left ureteral stent placement.  Patient was then admitted to the ICU postoperatively.  Subsequently patient was considered stable for transfer out of ICU to medical service.  At this time, patient has been having episodes of fever despite otherwise clinical improvement.  CT scan of the abdomen was done which showed renal abscess.  IR has been consulted for possible image guided drainage.  Assessment and plan.  Principal Problem:   Sepsis due to urinary tract infection (HCC) Active Problems:   AKI (acute kidney injury) (HCC)   Acute encephalopathy   Diabetic ketoacidosis without coma associated with type 2 diabetes mellitus (HCC)   Ureterolithiasis   Septic shock (HCC)   Severe sepsis with septic shock due to complicated E. coli urinary tract infection with obstructive uropathy,  E. coli bacteremia Lactic acidosis Patient had obstructive uropathy due to obstructing L UVJ calculus and underwent ureteric stent placement by urology on  05/28/2023.  Despite being on appropriate antibiotic patient continued to have spikes of fever temperature max in the last 24 hours at 101.2 F.  Patient was on cefazolin but after discussing with urology today we will  change to vancomycin and cefepime for broader coverage   latest WBC at 8.8.  Will continue to monitor.  Repeat blood and urine urine culture has been again sent.  Initial blood cultures negative in last 3 days.  Urology also recommending Foley catheter.   Acute respiratory insufficiency, postprocedure, resolved Patient was successfully extubated  currently on room air.  No active issues.   Diabetic ketoacidosis DKA has resolved.  Continue on insulin regimen.  Currently on Semglee  13 units twice daily, Continue sliding scale insulin.  Overall glycemic control has improved.  Latest POC glucose of 129.   Acute metabolic/septic encephalopathy Secondary to sepsis and DKA.  Resolved at this time.   AKI, anion gap metabolic acidosis Resolved.  Latest creatinine at 1.0.  Initial creatinine of 2.4.    Hypomagnesemia Replenished and improved.  Latest magnesium of 1.8.  Hyponatremia Mild.   Latest sodium of 133.  Will continue to monitor.  Hypokalemia Improved after replacement.  Latest potassium of 4.6.  Check BMP in AM.  Hypophosphatemia.  Replenished.  Latest phosphorus level of 4.1.  Paroxysmal A-fib On sinus rhythm at this time.  Continue aspirin for prophylaxis.   Hypothyroidism Continue home Synthroid   Depression/Anxiety/Schizophrenia Continue risperidone, Lexapro as clinically appropriate   Thrombocytopenia due to critical illness Improving platelet count 120 from initial 60.  Continue to monitor.  Check CBC in AM.  DVT prophylaxis: heparin injection 5,000 Units Start: 05/29/23 0600 SCDs Start: 05/28/23 2155  Disposition: Skilled nursing facility as per PT recommendation  Status is: Inpatient  Remains inpatient appropriate because: IV antibiotics, need for  rehabilitation, pending clinical improvement, fevers, new renal abscess    Code Status:     Code Status: Full Code  Family Communication:   Spoke with the patient's son on the phone on 06/02/2023  Consultants: PCCM Urology  Procedures: Left ureteric stent placement by urology 05/28/2023 Foley catheter placement.  Anti-infectives:  Cefazolin IV 06/02/2023>1/27 Cefepime and vanco 1/28>  Anti-infectives (From admission, onward)    Start     Dose/Rate Route Frequency Ordered Stop   06/05/23 0945  ceFEPIme (MAXIPIME) 2 g in sodium chloride 0.9 % 100 mL IVPB        2 g 200 mL/hr over 30 Minutes Intravenous Every 8 hours 06/05/23 0855     06/05/23 0945  vancomycin (VANCOREADY) IVPB 1500 mg/300 mL        1,500 mg 150 mL/hr over 120 Minutes Intravenous Every 24 hours 06/05/23 0855     06/02/23 0600  ceFAZolin (ANCEF) IVPB 2g/100 mL premix  Status:  Discontinued        2 g 200 mL/hr over 30 Minutes Intravenous Every 8 hours 06/01/23 1326 06/05/23 0821   05/31/23 0600  cefTRIAXone (ROCEPHIN) 2 g in sodium chloride 0.9 % 100 mL IVPB  Status:  Discontinued        2 g 200 mL/hr over 30 Minutes Intravenous Every 24 hours 05/30/23 0855 06/01/23 1326   05/30/23 0945  cefTRIAXone (ROCEPHIN) 1 g in sodium chloride 0.9 % 100 mL IVPB        1 g 200 mL/hr over 30 Minutes Intravenous  Once 05/30/23 0856 05/30/23 0937   05/29/23 0600  cefTRIAXone (ROCEPHIN) 1 g in sodium chloride 0.9 % 100 mL IVPB  Status:  Discontinued        1 g 200 mL/hr over 30 Minutes Intravenous Every 24 hours 05/28/23 2204 05/30/23 0855   05/28/23 2127  vancomycin (VANCOCIN) 1-5 GM/200ML-% IVPB       Note to Pharmacy: Brien Mates D: cabinet override      05/28/23 2127 05/29/23 0929   05/28/23 2000  vancomycin (VANCOREADY) IVPB 1500 mg/300 mL  Status:  Discontinued        1,500 mg 150 mL/hr over 120 Minutes Intravenous  Once 05/28/23 1957 05/28/23 2203   05/28/23 1900  Vancomycin (VANCOCIN) 1,500 mg in sodium chloride  0.9 % 500 mL IVPB  Status:  Discontinued        1,500 mg 250 mL/hr over 120 Minutes Intravenous  Once 05/28/23 1853 05/28/23 1958   05/28/23 1845  ceFEPIme (MAXIPIME) 2 g in sodium chloride 0.9 % 100 mL IVPB        2 g 200 mL/hr over 30 Minutes Intravenous  Once 05/28/23 1843 05/28/23 1935   05/28/23 1845  metroNIDAZOLE (FLAGYL) IVPB 500 mg        500 mg 100 mL/hr over 60 Minutes Intravenous  Once 05/28/23 1843 05/28/23 2048   05/28/23 1845  vancomycin (VANCOCIN) IVPB 1000 mg/200 mL premix  Status:  Discontinued        1,000 mg 200 mL/hr over 60 Minutes Intravenous  Once 05/28/23 1843 05/28/23 1853       Subjective: Today, patient was seen and examined at bedside.  Patient did have a spike of fever but denies any urinary urgency frequency dysuria denies any nausea vomiting abdominal pain.  Denies any shortness of breath or dyspnea.   Objective:  Vitals:   06/05/23 0638 06/05/23 0805  BP:  (!) 113/42  Pulse:  65  Resp:  18  Temp: 100 F (37.8 C) 98.3 F (36.8 C)  SpO2:  97%    Intake/Output Summary (Last 24 hours) at 06/05/2023 1249 Last data filed at 06/05/2023 0600 Gross per 24 hour  Intake 120 ml  Output 1300 ml  Net -1180 ml   Filed Weights   05/29/23 2100 05/30/23 0442 06/01/23 0500  Weight: 77.5 kg 77.5 kg 79.5 kg   Body mass index is 27.45 kg/m.   Physical Exam:  GENERAL: Patient is alert awake Not in obvious distress.  Oriented.  Elderly female.  HENT: No scleral pallor or icterus. Pupils equally reactive to light. Oral mucosa is moist NECK: is supple, no gross swelling noted. CHEST: Clear to auscultation. No crackles or wheezes.  CVS: S1 and S2 heard, no murmur. Regular rate and rhythm.  ABDOMEN: Soft, non-tender, bowel sounds are present.  No guarding or rigidity. EXTREMITIES: Trace lower extremity edema. CNS: Cranial nerves are intact.  Moves all extremities.   SKIN: warm and dry without rashes.  Data Review: I have personally reviewed the following  laboratory data and studies,  CBC: Recent Labs  Lab 05/31/23 0400 06/01/23 0437 06/02/23 0417 06/03/23 0412 06/04/23 0416  WBC 5.5 6.1 7.6 8.0 8.8  HGB 11.2* 11.8* 11.4* 11.5* 12.5  HCT 33.6* 34.8* 34.0* 34.2* 38.1  MCV 88.0 87.2 87.9 87.2 90.3  PLT 60* 82* 102* 153 120*   Basic Metabolic Panel: Recent Labs  Lab 05/31/23 0400 06/01/23 0437 06/02/23 0417 06/03/23 0412 06/04/23 0416  NA 133* 138 138 138 133*  K 3.3* 3.7 3.5 3.3* 4.6  CL 99 105 106 105 103  CO2 23 24 23 25  20*  GLUCOSE 238* 174* 112* 115* 145*  BUN 21 22 22 21 23   CREATININE 1.38* 1.18* 1.15* 0.98 1.01*  CALCIUM 7.7* 8.7* 7.9* 7.5* 7.5*  MG 1.8 1.7 1.9 1.8 1.8  PHOS  --  1.6*  --  4.1  --    Liver Function Tests: Recent Labs  Lab 06/02/23 0417  AST 42*  ALT 30  ALKPHOS 133*  BILITOT 0.8  PROT 4.7*  ALBUMIN 1.7*   No results for input(s): "LIPASE", "AMYLASE" in the last 168 hours. No results for input(s): "AMMONIA" in the last 168 hours.  Cardiac Enzymes: No results for input(s): "CKTOTAL", "CKMB", "CKMBINDEX", "TROPONINI" in the last 168 hours.  BNP (last 3 results) Recent Labs    05/28/23 1820 06/03/23 0412  BNP 89.3 160.2*    ProBNP (last 3 results) No results for input(s): "PROBNP" in the last 8760 hours.  CBG: Recent Labs  Lab 06/04/23 1156 06/04/23 1704 06/04/23 1950 06/05/23 0812 06/05/23 1217  GLUCAP 244* 220* 131* 160* 129*   Recent Results (from the past 240 hours)  Blood Culture (routine x 2)     Status: Abnormal   Collection Time: 05/28/23  6:17 PM   Specimen: BLOOD  Result Value Ref Range Status   Specimen Description BLOOD SITE NOT SPECIFIED  Final   Special Requests   Final    BOTTLES DRAWN AEROBIC AND ANAEROBIC Blood Culture results may not be optimal due to an inadequate volume of blood received in culture bottles   Culture  Setup Time   Final    GRAM NEGATIVE RODS IN BOTH AEROBIC AND ANAEROBIC BOTTLES CRITICAL RESULT CALLED TO, READ BACK BY AND VERIFIED  WITH: Gwendolyn Fill A [id number] FCP Performed at [facility], [address] [place], [place] [address]    Culture ESCHERICHIA COLI (A)  Final   Report Status 05/31/2023 FINAL  Final   Organism ID, Bacteria ESCHERICHIA COLI  Final   Organism ID, Bacteria ESCHERICHIA COLI  Final      Susceptibility   Escherichia coli - KIRBY BAUER*    CEFAZOLIN SENSITIVE Sensitive    Escherichia coli - MIC*    AMPICILLIN <=2 SENSITIVE Sensitive     CEFEPIME <=0.12 SENSITIVE Sensitive     CEFTAZIDIME <=1 SENSITIVE Sensitive     CEFTRIAXONE <=0.25 SENSITIVE Sensitive     CIPROFLOXACIN <=0.25 SENSITIVE Sensitive     GENTAMICIN <=1 SENSITIVE Sensitive     IMIPENEM <=0.25 SENSITIVE Sensitive     TRIMETH/SULFA <=20 SENSITIVE Sensitive     AMPICILLIN/SULBACTAM <=2 SENSITIVE Sensitive     PIP/TAZO <=4 SENSITIVE Sensitive ug/mL    * ESCHERICHIA COLI    ESCHERICHIA COLI  Blood Culture ID Panel (Reflexed)     Status: Abnormal   Collection Time: 05/28/23  6:17 PM  Result Value Ref Range Status   Enterococcus faecalis NOT DETECTED NOT DETECTED Final   Enterococcus Faecium NOT DETECTED NOT DETECTED Final   Listeria monocytogenes NOT DETECTED NOT DETECTED Final   Staphylococcus species NOT DETECTED NOT DETECTED Final   Staphylococcus aureus (BCID) NOT DETECTED NOT DETECTED Final   Staphylococcus epidermidis NOT DETECTED NOT DETECTED Final   Staphylococcus lugdunensis NOT DETECTED NOT DETECTED Final   Streptococcus species NOT DETECTED NOT DETECTED Final   Streptococcus agalactiae NOT DETECTED NOT DETECTED Final   Streptococcus pneumoniae NOT DETECTED NOT DETECTED Final   Streptococcus pyogenes NOT DETECTED NOT DETECTED Final   A.calcoaceticus-baumannii NOT DETECTED NOT DETECTED Final   Bacteroides fragilis NOT DETECTED NOT DETECTED Final   Enterobacterales DETECTED (A) NOT DETECTED Final    Comment: Enterobacterales represent a large order of gram negative bacteria, not a single organism. CRITICAL  RESULT CALLED TO, READ BACK BY AND VERIFIED WITH: PHARMD BAILEY A [phone] [id number] FCP    Enterobacter cloacae complex NOT DETECTED NOT DETECTED Final   Escherichia coli DETECTED (A) NOT DETECTED Final    Comment: CRITICAL RESULT CALLED TO, READ BACK BY AND VERIFIED WITH: PHARMD BAILEY A [phone] FCP    Klebsiella aerogenes NOT DETECTED NOT DETECTED Final   Klebsiella oxytoca NOT DETECTED NOT DETECTED Final   Klebsiella pneumoniae NOT DETECTED NOT DETECTED Final   Proteus species NOT DETECTED NOT DETECTED Final   Salmonella species NOT DETECTED NOT DETECTED Final   Serratia marcescens NOT DETECTED NOT DETECTED Final   Haemophilus influenzae NOT DETECTED NOT DETECTED Final   Neisseria meningitidis NOT DETECTED NOT DETECTED Final   Pseudomonas aeruginosa NOT DETECTED NOT DETECTED Final   Stenotrophomonas maltophilia NOT DETECTED NOT DETECTED Final   Candida albicans NOT DETECTED NOT DETECTED Final   Candida auris NOT DETECTED NOT DETECTED Final   Candida glabrata NOT DETECTED NOT DETECTED Final   Candida krusei NOT DETECTED NOT DETECTED Final   Candida parapsilosis NOT DETECTED NOT DETECTED Final   Candida tropicalis NOT DETECTED NOT DETECTED Final   Cryptococcus neoformans/gattii NOT DETECTED NOT DETECTED Final   CTX-M ESBL NOT DETECTED NOT DETECTED Final   Carbapenem resistance IMP NOT DETECTED NOT DETECTED Final   Carbapenem resistance KPC NOT DETECTED NOT DETECTED Final   Carbapenem resistance NDM NOT DETECTED NOT DETECTED Final   Carbapenem resist OXA 48 LIKE NOT DETECTED NOT DETECTED Final   Carbapenem resistance VIM NOT DETECTED NOT DETECTED Final  Comment: Performed at [facility], [address], [place], [place] [address]  Resp panel by RT-PCR (RSV, Flu A&B, Covid) Anterior Nasal Swab     Status: None   Collection Time: 05/28/23  6:18 PM   Specimen: Anterior Nasal Swab  Result Value Ref Range Status   SARS Coronavirus 2 by RT PCR NEGATIVE NEGATIVE Final   Influenza A  by PCR NEGATIVE NEGATIVE Final   Influenza B by PCR NEGATIVE NEGATIVE Final    Comment: (NOTE) The Xpert Xpress SARS-CoV-2/FLU/RSV plus assay is intended as an aid in the diagnosis of influenza from Nasopharyngeal swab specimens and should not be used as a sole basis for treatment. Nasal washings and aspirates are unacceptable for Xpert Xpress SARS-CoV-2/FLU/RSV testing.  Fact Sheet for Patients: BloggerCourse.com  Fact Sheet for Healthcare Providers: SeriousBroker.it  This test is not yet approved or cleared by the [place] FDA and has been authorized for detection and/or diagnosis of SARS-CoV-2 by FDA under an Emergency Use Authorization (EUA). This EUA will remain in effect (meaning this test can be used) for the duration of the COVID-19 declaration under Section 564(b)(1) of the Act, 21 U.S.C. section 360bbb-3(b)(1), unless the authorization is terminated or revoked.     Resp Syncytial Virus by PCR NEGATIVE NEGATIVE Final    Comment: (NOTE) Fact Sheet for Patients: BloggerCourse.com  Fact Sheet for Healthcare Providers: SeriousBroker.it  This test is not yet approved or cleared by the [place] FDA and has been authorized for detection and/or diagnosis of SARS-CoV-2 by FDA under an Emergency Use Authorization (EUA). This EUA will remain in effect (meaning this test can be used) for the duration of the COVID-19 declaration under Section 564(b)(1) of the Act, 21 U.S.C. section 360bbb-3(b)(1), unless the authorization is terminated or revoked.  Performed at [facility], [address], [place], [place] [address]   Blood Culture (routine x 2)     Status: Abnormal   Collection Time: 05/28/23  7:00 PM   Specimen: BLOOD  Result Value Ref Range Status   Specimen Description BLOOD SITE NOT SPECIFIED  Final   Special Requests   Final    BOTTLES DRAWN AEROBIC AND  ANAEROBIC Blood Culture results may not be optimal due to an inadequate volume of blood received in culture bottles   Culture  Setup Time   Final    GRAM NEGATIVE RODS IN BOTH AEROBIC AND ANAEROBIC BOTTLES CRITICAL VALUE NOTED.  VALUE IS CONSISTENT WITH PREVIOUSLY REPORTED AND CALLED VALUE.    Culture (A)  Final    ESCHERICHIA COLI SUSCEPTIBILITIES PERFORMED ON PREVIOUS CULTURE WITHIN THE LAST 5 DAYS. Performed at [facility], [address], [place], [place] [address]    Report Status 05/31/2023 FINAL  Final  Urine Culture     Status: Abnormal   Collection Time: 05/28/23 10:00 PM   Specimen: Urine, Cystoscope  Result Value Ref Range Status   Specimen Description CYSTOSCOPY  Final   Special Requests   Final    NONE Performed at [facility], [address], [place], [place] [address]    Culture >=100,000 COLONIES/mL ESCHERICHIA COLI (A)  Final   Report Status 05/30/2023 FINAL  Final   Organism ID, Bacteria ESCHERICHIA COLI (A)  Final      Susceptibility   Escherichia coli - MIC*    AMPICILLIN 4 SENSITIVE Sensitive     CEFEPIME <=0.12 SENSITIVE Sensitive     CEFTAZIDIME <=1 SENSITIVE Sensitive  CEFTRIAXONE <=0.25 SENSITIVE Sensitive     CIPROFLOXACIN <=0.25 SENSITIVE Sensitive     GENTAMICIN <=1 SENSITIVE Sensitive     IMIPENEM <=0.25 SENSITIVE Sensitive     TRIMETH/SULFA <=20 SENSITIVE Sensitive     AMPICILLIN/SULBACTAM <=2 SENSITIVE Sensitive     PIP/TAZO <=4 SENSITIVE Sensitive ug/mL    * >=100,000 COLONIES/mL ESCHERICHIA COLI  MRSA Next Gen by PCR, Nasal     Status: None   Collection Time: 05/28/23 10:04 PM   Specimen: Nasal Mucosa; Nasal Swab  Result Value Ref Range Status   MRSA by PCR Next Gen NOT DETECTED NOT DETECTED Final    Comment: (NOTE) The GeneXpert MRSA Assay (FDA approved for NASAL specimens only), is one component of a comprehensive MRSA colonization surveillance program. It is not intended to diagnose MRSA infection nor to guide or  monitor treatment for MRSA infections. Test performance is not FDA approved in patients less than 53 years old. Performed at [facility], [address], [place], [place] [address]   Culture, blood (Routine X 2) w Reflex to ID Panel     Status: None (Preliminary result)   Collection Time: 06/02/23  9:18 PM   Specimen: BLOOD LEFT HAND  Result Value Ref Range Status   Specimen Description BLOOD LEFT HAND  Final   Special Requests   Final    BOTTLES DRAWN AEROBIC AND ANAEROBIC Blood Culture results may not be optimal due to an inadequate volume of blood received in culture bottles   Culture   Final    NO GROWTH 3 DAYS Performed at [facility], [address] [place], [place] [address]    Report Status PENDING  Incomplete  Culture, blood (Routine X 2) w Reflex to ID Panel     Status: None (Preliminary result)   Collection Time: 06/02/23  9:25 PM   Specimen: BLOOD LEFT HAND  Result Value Ref Range Status   Specimen Description BLOOD LEFT HAND  Final   Special Requests   Final    BOTTLES DRAWN AEROBIC AND ANAEROBIC Blood Culture results may not be optimal due to an inadequate volume of blood received in culture bottles   Culture   Final    NO GROWTH 3 DAYS Performed at [facility], [address], [place], [place] [address]    Report Status PENDING  Incomplete     Studies: CT ABDOMEN PELVIS W CONTRAST Result Date: 06/05/2023 CLINICAL DATA:  Abdominal and flank pain, hematuria. EXAM: CT ABDOMEN AND PELVIS WITH CONTRAST TECHNIQUE: Multidetector CT imaging of the abdomen and pelvis was performed using the standard protocol following bolus administration of intravenous contrast. RADIATION DOSE REDUCTION: This exam was performed according to the departmental dose-optimization program which includes automated exposure control, adjustment of the mA and/or kV according to patient size and/or use of iterative reconstruction technique. CONTRAST:  75mL OMNIPAQUE IOHEXOL 350  MG/ML SOLN COMPARISON:  CT chest abdomen and pelvis 05/28/2023 FINDINGS: Lower chest: There are new small bilateral pleural effusions with bibasilar atelectasis. Hepatobiliary: Gallstones are present. There is no biliary ductal dilatation. The liver is within normal limits. Pancreas: Unremarkable. No pancreatic ductal dilatation or surrounding inflammatory changes. Spleen: Normal in size without focal abnormality. Adrenals/Urinary Tract: A new left ureteral stent is in place. There is no hydronephrosis. The left kidney is enlarged with mild perinephric fat stranding. Striated nephrogram appearance of the left kidney. There are patchy areas of hypodensity throughout the left kidney, many of which are seen peripherally. Dominant  area is seen posteriorly on image 3/37 measuring 2.4 x 1.9 by 2.2 cm. Second largest areas seen anteriorly measuring 2.1 by 2.0 by 1.0 cm image 3/40. Left renal calculi are again seen measuring up to 10 mm. No definite ureteral or bladder calculi are identified. The right kidney and adrenal glands are within normal limits. Stomach/Bowel: There is a small hiatal hernia. Stomach is within normal limits. Appendix is not seen. Small bowel anastomosis is present in the right lower quadrant. No evidence of bowel wall thickening, distention, or inflammatory changes. Vascular/Lymphatic: No significant vascular findings are present. No enlarged abdominal or pelvic lymph nodes. Reproductive: Uterus and bilateral adnexa are unremarkable. Other: Presacral edema is new from prior. There is new body wall edema. There is no ascites. There some bulging of the anterior abdominal wall, unchanged. Musculoskeletal: Degenerative changes affect the spine. IMPRESSION: 1. New left ureteral stent in place. No hydronephrosis. 2. Enlarged left kidney with striated nephrogram appearance and perinephric fat stranding. Findings are compatible with pyelonephritis. 3. Patchy areas of hypodensity throughout the left kidney  worrisome for renal abscesses. 4. Left nonobstructing renal calculi. 5. New small bilateral pleural effusions with bibasilar atelectasis. 6. New body wall edema and presacral edema. 7. Cholelithiasis. 8. Bosniak I benign renal cyst measuring 2.4 cm. No follow-up imaging is recommended. JACR 2018 Feb; [id number], Management of the Incidental Renal Mass on CT, [employer] 2021; [id number], Bosniak Classification of Cystic Renal Masses, Version 2019. Electronically Signed   By: Darliss Cheney M.D.   On: 06/05/2023 01:18      Joycelyn Das, MD  [facility] 06/05/2023  If 7PM-7AM, please contact night-coverage

## 2023-06-05 NOTE — Progress Notes (Signed)
Interventional Radiology Brief Note:  IR consulted for aspiration vs. Drainage of small perinephric renal abscesses. Case reviewed by Dr. Elby Showers who notes collections are not amenable to percutaneous aspiration due to size.   Ordering team made aware by Dr. Elby Showers.  Loyce Dys, MS RD PA-C 12:56 PM

## 2023-06-05 NOTE — TOC Progression Note (Addendum)
Transition of Care [facility]) - Progression Note    Patient Details  Name: Deanna Shea MRN: [id number] Date of Birth: 04/21/1946  Transition [facility]) CM/SW Contact  Marliss Coots, LCSW Phone Number: 06/05/2023, 1:22 PM  Clinical Narrative:     1:23 PM CSW attempted to call patient's daughter in law, Cheralyn, regarding patient discharge to SNF vs. Return to [facility] with home health, but there was no response and a voicemail was left.  1:43 PM Patient's daughter in law, Cheralyn, called CSW to inform of decision of patient discharge to SNF in [facility] vs. Return to [facility]. Cheralyn expressed preference in [facility] and [facility].  3:48 PM CSW submitted additional clinicals requested by [place] Must ID for PASRR. PASRR review is currently pending. CSW attempted to call admissions at [facility] but there was no response and a voicemail was left.  3:57 PM CSW received return call from [facility] admissions who informed CSW that their SNF closed in 2020.  5:04 PM CSW received PASRRE (expires 07/05/2023).    Barriers to Discharge: Continued Medical Work up  Expected Discharge Plan and Services In-house Referral: Clinical Social Work     Living arrangements for the past 2 months: Assisted Living Facility                                       Social Determinants of Health (SDOH) Interventions SDOH Screenings   Food Insecurity: No Food Insecurity (05/30/2023)  Housing: Low Risk  (05/30/2023)  Transportation Needs: No Transportation Needs (05/30/2023)  Utilities: Not At Risk (05/30/2023)  Social Connections: Patient Unable To Answer (05/30/2023)  Tobacco Use: Low Risk  (05/28/2023)    Readmission Risk Interventions     No data to display

## 2023-06-05 NOTE — Progress Notes (Addendum)
Pharmacy Antibiotic Note  Deanna Shea is a 78 y.o. adult female admitted on 05/28/2023. Patient presents today with new abdominal abscesses and sepsis.  Urology has recommended to broaden coverage and drain abscesses. Patient currently on cefazolin for E.coli bacteremia (pan sensitive). Persistently febrile. Pharmacy has been consulted for Vancomycin and cefepime dosing.  Plan: D/C Cefazolin Cefepime 2gm IV q8h Vancomycin 1500mg  IV q24h (Scr 1.101, VD 0.72 L/kg, eAUC 490) F/u new cultures, monitor renal function and for deescalation Vancomycin levels as needed   Height: 5\' 7"  (170.2 cm) Weight: 79.5 kg (175 lb 4.3 oz) IBW/kg (Calculated) : 61.6  Temp (24hrs), Avg:99.6 F (37.6 C), Min:98 F (36.7 C), Max:101.2 F (38.4 C)  Recent Labs  Lab 05/30/23 0942 05/31/23 0400 06/01/23 0437 06/02/23 0417 06/03/23 0412 06/04/23 0416  WBC  --  5.5 6.1 7.6 8.0 8.8  CREATININE  --  1.38* 1.18* 1.15* 0.98 1.01*  LATICACIDVEN 2.6*  --   --   --   --   --     Estimated Creatinine Clearance (by C-G formula based on SCr of 1.01 mg/dL (H)) Female: 08.6 mL/min (A) Female: 63.9 mL/min (A)    Allergies  Allergen Reactions   Sulfa Antibiotics Other (See Comments)    Sores in mouth    Antimicrobials this admission: 1/20 cefepime x 1, 1/28 >> 1/21 Ceftriaxone >> 1/24 1/25 Cefazolin >>1/28 1/28 Vancomycin >>  Dose adjustments this admission: N/a  Microbiology results: 1/20  BCx: E.col 2/2 1/25 Bcx: NGTD 1/20 UCx: E.Coli (pan-Sensitive)  1/28 Bcx: Ordered 1/28 Ucx : Ordered   Deanna Shea, PharmD, BCPS, FNKF Clinical Pharmacist [place] Please utilize Amion for appropriate phone number to reach the unit pharmacist [facility])  06/05/2023 8:44 AM

## 2023-06-05 NOTE — Progress Notes (Signed)
TRH night cross cover note:   Updated CT abd/pelvis w/ contrast is concerning for new left renal abscess.   This is notable in this patient that continues to spike objective fevers in spite of treatment of e coli uti. Tmax overnight noted to be 101.2. Most recent VS show heart rates in the 80s, and most recent blood pressure 123/48.  In light of the above radiographic/clinical findings, I've placed order for interventional radiology consult this morning.  I've also made the patient n.p.o., and have asked RN to please hold the 6 AM dose of subcutaneous heparin.  Of note, I have not made any modifications to the existing antibiotic regimen.    Newton Pigg, DO Hospitalist

## 2023-06-05 NOTE — Care Management Important Message (Signed)
Important Message  Patient Details  Name: Deanna Shea MRN: [id number] Date of Birth: 01-Jan-1946   Important Message Given:  Yes - Medicare IM     Dorena Bodo 06/05/2023, 4:25 PM

## 2023-06-05 NOTE — Progress Notes (Signed)
8 Days Post-Op Subjective: No N/V. Having spiking fevers overnight. Denies pain.   Objective: Vital signs in last 24 hours: Temp:  [98 F (36.7 C)-101.2 F (38.4 C)] 100 F (37.8 C) (01/28 4098) Pulse Rate:  [65-81] 81 (01/28 0441) Resp:  [17-18] 18 (01/28 0048) BP: (116-133)/(48-62) 123/48 (01/28 0441) SpO2:  [93 %-97 %] 93 % (01/28 0441)  Intake/Output from previous day: 01/27 0701 - 01/28 0700 In: 120 [P.O.:120] Out: 2100 [Urine:2100] Intake/Output this shift: No intake/output data recorded.  Physical Exam:  General: Alert and oriented, pt is diaphoretic  CV: RRR Lungs: Clear Abdomen: Soft, ND, ATTP; no flank pain  GU: no catheter   Lab Results: Recent Labs    06/03/23 0412 06/04/23 0416  HGB 11.5* 12.5  HCT 34.2* 38.1   BMET Recent Labs    06/03/23 0412 06/04/23 0416  NA 138 133*  K 3.3* 4.6  CL 105 103  CO2 25 20*  GLUCOSE 115* 145*  BUN 21 23  CREATININE 0.98 1.01*  CALCIUM 7.5* 7.5*     Studies/Results: CT ABDOMEN PELVIS W CONTRAST Result Date: 06/05/2023 CLINICAL DATA:  Abdominal and flank pain, hematuria. EXAM: CT ABDOMEN AND PELVIS WITH CONTRAST TECHNIQUE: Multidetector CT imaging of the abdomen and pelvis was performed using the standard protocol following bolus administration of intravenous contrast. RADIATION DOSE REDUCTION: This exam was performed according to the departmental dose-optimization program which includes automated exposure control, adjustment of the mA and/or kV according to patient size and/or use of iterative reconstruction technique. CONTRAST:  75mL OMNIPAQUE IOHEXOL 350 MG/ML SOLN COMPARISON:  CT chest abdomen and pelvis 05/28/2023 FINDINGS: Lower chest: There are new small bilateral pleural effusions with bibasilar atelectasis. Hepatobiliary: Gallstones are present. There is no biliary ductal dilatation. The liver is within normal limits. Pancreas: Unremarkable. No pancreatic ductal dilatation or surrounding inflammatory changes.  Spleen: Normal in size without focal abnormality. Adrenals/Urinary Tract: A new left ureteral stent is in place. There is no hydronephrosis. The left kidney is enlarged with mild perinephric fat stranding. Striated nephrogram appearance of the left kidney. There are patchy areas of hypodensity throughout the left kidney, many of which are seen peripherally. Dominant area is seen posteriorly on image 3/37 measuring 2.4 x 1.9 by 2.2 cm. Second largest areas seen anteriorly measuring 2.1 by 2.0 by 1.0 cm image 3/40. Left renal calculi are again seen measuring up to 10 mm. No definite ureteral or bladder calculi are identified. The right kidney and adrenal glands are within normal limits. Stomach/Bowel: There is a small hiatal hernia. Stomach is within normal limits. Appendix is not seen. Small bowel anastomosis is present in the right lower quadrant. No evidence of bowel wall thickening, distention, or inflammatory changes. Vascular/Lymphatic: No significant vascular findings are present. No enlarged abdominal or pelvic lymph nodes. Reproductive: Uterus and bilateral adnexa are unremarkable. Other: Presacral edema is new from prior. There is new body wall edema. There is no ascites. There some bulging of the anterior abdominal wall, unchanged. Musculoskeletal: Degenerative changes affect the spine. IMPRESSION: 1. New left ureteral stent in place. No hydronephrosis. 2. Enlarged left kidney with striated nephrogram appearance and perinephric fat stranding. Findings are compatible with pyelonephritis. 3. Patchy areas of hypodensity throughout the left kidney worrisome for renal abscesses. 4. Left nonobstructing renal calculi. 5. New small bilateral pleural effusions with bibasilar atelectasis. 6. New body wall edema and presacral edema. 7. Cholelithiasis. 8. Bosniak I benign renal cyst measuring 2.4 cm. No follow-up imaging is recommended. JACR 2018 Feb; 264-273,  Management of the Incidental Renal Mass on CT,  [employer] 2021; 814-848, Bosniak Classification of Cystic Renal Masses, Version 2019. Electronically Signed   By: Darliss Cheney M.D.   On: 06/05/2023 01:18    Assessment/Plan: #sepsis #emphysematous pyelitis  #left ureteral stone   To the OR with Dr. Jennette Bill on 05/28/23 for urgent ureteral stent placement.  Outpatient follow-up for definitive stone management.  URS w/ LL Hemodynamically stable and on the floor.  Still having cyclical fever.   CT abdomen pelvis shows small abscesses < 4cm. Based on DM creating immunocompromised  state recommend IR assessment to see if largest abscess can be drained  Repeat Urine Cx and blood Cx  Recommend broaden antibiotic coverage either with cefepime and zosyn and Gram + coverage  Replace catheter while having fever and keep catheter in place until discharge (concern about high pressure bladder refluxing urine up the stent).  Trend labs.  Serum creatinine slightly above baseline Will follow   LOS: 8 days   Sherle Poe MD 06/05/2023, 7:13 AM [facility]

## 2023-06-05 NOTE — NC FL2 (Addendum)
[place] MEDICAID FL2 LEVEL OF CARE FORM     IDENTIFICATION  Patient Name: Deanna Shea Birthdate: [birth year] Sex: adult Admission Date (Current Location): 05/28/2023  [facility] and [place] Number:  Producer, television/film/video and Address:  [facility] [place], [address], [place], [place] [address]      Provider Number: [id number]  Attending Physician Name and Address:  Joycelyn Das, MD  Relative Name and Phone Number:  Jeany Seville; Daughter in law; [phone]  Current Level of Care: Hospital Recommended Level of Care: Skilled Nursing Facility Prior Approval Number:    Date Approved/Denied:   PASRR Number:   [id number]  Discharge Plan: SNF    Current Diagnoses: Patient Active Problem List   Diagnosis Date Noted   Sepsis due to urinary tract infection (HCC) 05/28/2023   AKI (acute kidney injury) (HCC) 05/28/2023   Acute encephalopathy 05/28/2023   Diabetic ketoacidosis without coma associated with type 2 diabetes mellitus (HCC) 05/28/2023   Ureterolithiasis 05/28/2023   Septic shock (HCC) 05/28/2023   Rhabdomyolysis 01/03/2023   Hypothyroid 07/24/2021   Diet-controlled type 2 diabetes mellitus (HCC) 12/30/2015   SBO (small bowel obstruction) (HCC) 01/27/2014   Atrial fibrillation (HCC) 10/28/2013   Palpitations 10/28/2013   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    Orientation RESPIRATION BLADDER Height & Weight     Self, Time, Place  Normal (Room Air) Incontinent, Indwelling catheter Weight: 175 lb 4.3 oz (79.5 kg) Height:  5\' 7"  (170.2 cm)  BEHAVIORAL SYMPTOMS/MOOD NEUROLOGICAL BOWEL NUTRITION STATUS      Incontinent Diet (Please see dc summary)  AMBULATORY STATUS COMMUNICATION OF NEEDS Skin   Extensive Assist Verbally Normal                       Personal Care Assistance Level of Assistance  Bathing, Feeding, Dressing Bathing Assistance: Maximum assistance Feeding assistance: Maximum assistance Dressing Assistance: Maximum  assistance     Functional Limitations Info  Sight Sight Info: Impaired (Eyeglasses)        SPECIAL CARE FACTORS FREQUENCY  PT (By licensed PT), OT (By licensed OT)     PT Frequency: 5x OT Frequency: 5x            Contractures Contractures Info: Not present    Additional Factors Info  Code Status, Allergies Code Status Info: Full Code Allergies Info: Sulfa Antibiotics           Current Medications (06/05/2023):  This is the current hospital active medication list Current Facility-Administered Medications  Medication Dose Route Frequency Provider Last Rate Last Admin   acetaminophen (TYLENOL) tablet 650 mg  650 mg Oral Q6H PRN Pokhrel, Laxman, MD   650 mg at 06/05/23 0524   aspirin EC tablet 81 mg  81 mg Oral Daily Cloyd Stagers M, PA-C   81 mg at 06/05/23 2841   ceFEPIme (MAXIPIME) 2 g in sodium chloride 0.9 % 100 mL IVPB  2 g Intravenous Q8H Pierce, Dwayne A, RPH 200 mL/hr at 06/05/23 0952 2 g at 06/05/23 3244   Chlorhexidine Gluconate Cloth 2 % PADS 6 each  6 each Topical Daily Patrici Ranks, MD   6 each at 06/05/23 0927   dextrose 50 % solution 0-50 mL  0-50 mL Intravenous PRN Gloris Manchester, MD       docusate sodium (COLACE) capsule 100 mg  100 mg Oral BID PRN Cheri Fowler, MD       escitalopram (LEXAPRO) tablet 20 mg  20 mg  Oral Daily Cheri Fowler, MD   20 mg at 06/05/23 6045   feeding supplement (ENSURE ENLIVE / ENSURE PLUS) liquid 237 mL  237 mL Oral BID BM Pokhrel, Laxman, MD   237 mL at 06/04/23 0937   fentaNYL (SUBLIMAZE) injection 25-100 mcg  25-100 mcg Intravenous Q30 min PRN Tim Lair, PA-C   50 mcg at 05/30/23 0518   heparin injection 5,000 Units  5,000 Units Subcutaneous Q8H Ralene Muskrat, PA-C   5,000 Units at 06/05/23 0524   insulin aspart (novoLOG) injection 0-15 Units  0-15 Units Subcutaneous TID WC Pokhrel, Laxman, MD   2 Units at 06/05/23 1241   insulin aspart (novoLOG) injection 0-5 Units  0-5 Units Subcutaneous QHS Pokhrel, Laxman, MD    2 Units at 06/03/23 2154   insulin glargine-yfgn (SEMGLEE) injection 13 Units  13 Units Subcutaneous BID Cheri Fowler, MD   13 Units at 06/05/23 0940   levothyroxine (SYNTHROID) tablet 88 mcg  88 mcg Oral Q0600 Cheri Fowler, MD   88 mcg at 06/05/23 0524   multivitamin with minerals tablet 1 tablet  1 tablet Oral Daily Pokhrel, Laxman, MD   1 tablet at 06/05/23 0927   ondansetron (ZOFRAN) injection 4 mg  4 mg Intravenous Q6H PRN Tim Lair, PA-C       Oral care mouth rinse  15 mL Mouth Rinse PRN Cheri Fowler, MD       polyethylene glycol (MIRALAX / GLYCOLAX) packet 17 g  17 g Oral Daily PRN Cheri Fowler, MD       propranolol (INDERAL) tablet 20 mg  20 mg Oral QID Pokhrel, Laxman, MD   20 mg at 06/05/23 1341   risperiDONE (RISPERDAL) tablet 3 mg  3 mg Oral QHS Chand, Garnet Sierras, MD   3 mg at 06/04/23 2144   rosuvastatin (CRESTOR) tablet 2.5 mg  2.5 mg Oral Daily Cheri Fowler, MD   2.5 mg at 06/05/23 0926   vancomycin (VANCOREADY) IVPB 1500 mg/300 mL  1,500 mg Intravenous Q24H Lodema Hong A, RPH 150 mL/hr at 06/05/23 1030 1,500 mg at 06/05/23 1030     Discharge Medications: Please see discharge summary for a list of discharge medications.  Relevant Imaging Results:  Relevant Lab Results:   Additional Information SSN: [id number]  Marliss Coots, LCSW

## 2023-06-05 NOTE — Progress Notes (Signed)
Mobility Specialist Progress Note:    06/05/23 1607  Mobility  Activity Transferred from bed to chair  Level of Assistance Minimal assist, patient does 75% or more (+2)  Assistive Device Front wheel walker  Distance Ambulated (ft) 3 ft  Activity Response Tolerated well  Mobility Referral Yes  Mobility visit 1 Mobility  Mobility Specialist Start Time (ACUTE ONLY) 1515  Mobility Specialist Stop Time (ACUTE ONLY) 1530  Mobility Specialist Time Calculation (min) (ACUTE ONLY) 15 min   Pt received in bed agreeable to mobility. MinA for bed mobility and MinA +2 w/STS. No c/o throughout. Was able to take a couple steps towards the chair w/o fault. Call bell and personal belongings in reach. All needs met. RN aware.  Thompson Grayer Mobility Specialist  Please contact vis Secure Chat or  Rehab Office [phone]

## 2023-06-06 DIAGNOSIS — N390 Urinary tract infection, site not specified: Secondary | ICD-10-CM | POA: Diagnosis not present

## 2023-06-06 DIAGNOSIS — A419 Sepsis, unspecified organism: Secondary | ICD-10-CM | POA: Diagnosis not present

## 2023-06-06 LAB — BASIC METABOLIC PANEL
Anion gap: 6 (ref 5–15)
BUN: 14 mg/dL (ref 8–23)
CO2: 23 mmol/L (ref 22–32)
Calcium: 7.6 mg/dL — ABNORMAL LOW (ref 8.9–10.3)
Chloride: 105 mmol/L (ref 98–111)
Creatinine, Ser: 0.88 mg/dL (ref 0.44–1.00)
GFR, Estimated: >60 mL/min (ref >60)
Glucose, Bld: 134 mg/dL — ABNORMAL HIGH (ref 70–99)
Potassium: 3.9 mmol/L (ref 3.5–5.1)
Sodium: 134 mmol/L — ABNORMAL LOW (ref 135–145)

## 2023-06-06 LAB — CBC WITH DIFFERENTIAL/PLATELET
Abs Immature Granulocytes: 0.14 10*3/uL — ABNORMAL HIGH (ref 0.00–0.07)
Basophils Absolute: 0.1 10*3/uL (ref 0.0–0.1)
Basophils Relative: 1 %
Eosinophils Absolute: 0 10*3/uL (ref 0.0–0.5)
Eosinophils Relative: 1 %
HCT: 34 % — ABNORMAL LOW (ref 36.0–46.0)
Hemoglobin: 10.9 g/dL — ABNORMAL LOW (ref 12.0–15.0)
Immature Granulocytes: 2 %
Lymphocytes Relative: 13 %
Lymphs Abs: 1 10*3/uL (ref 0.7–4.0)
MCH: 29.1 pg (ref 26.0–34.0)
MCHC: 32.1 g/dL (ref 30.0–36.0)
MCV: 90.9 fL (ref 80.0–100.0)
Monocytes Absolute: 0.5 10*3/uL (ref 0.1–1.0)
Monocytes Relative: 6 %
Neutro Abs: 6.5 10*3/uL (ref 1.7–7.7)
Neutrophils Relative %: 77 %
Platelets: 372 10*3/uL (ref 150–400)
RBC: 3.74 MIL/uL — ABNORMAL LOW (ref 3.87–5.11)
RDW: 14 % (ref 11.5–15.5)
WBC: 8.3 10*3/uL (ref 4.0–10.5)
nRBC: 0 % (ref 0.0–0.2)

## 2023-06-06 LAB — URINE CULTURE: Culture: NO GROWTH

## 2023-06-06 LAB — MAGNESIUM: Magnesium: 1.8 mg/dL (ref 1.7–2.4)

## 2023-06-06 LAB — GLUCOSE, CAPILLARY
Glucose-Capillary: 129 mg/dL — ABNORMAL HIGH (ref 70–99)
Glucose-Capillary: 165 mg/dL — ABNORMAL HIGH (ref 70–99)
Glucose-Capillary: 101 mg/dL — ABNORMAL HIGH (ref 70–99)
Glucose-Capillary: 179 mg/dL — ABNORMAL HIGH (ref 70–99)

## 2023-06-06 NOTE — TOC Progression Note (Signed)
Transition of Care [facility]) - Progression Note    Patient Details  Name: Deanna Shea MRN: [id number] Date of Birth: 09/12/1945  [facility]) CM/SW Contact  Mearl Latin, LCSW Phone Number: 06/06/2023, 9:06 AM  Clinical Narrative:    9:06 AM-CSW asked [facility] if they have beds but they do not. [facility] is not in network. CSW expanded SNF search.      Barriers to Discharge: Continued Medical Work up  Expected Discharge Plan and Services In-house Referral: Clinical Social Work     Living arrangements for the past 2 months: Assisted Living Facility                                       Social Determinants of Health (SDOH) Interventions SDOH Screenings   Food Insecurity: No Food Insecurity (05/30/2023)  Housing: Low Risk  (05/30/2023)  Transportation Needs: No Transportation Needs (05/30/2023)  Utilities: Not At Risk (05/30/2023)  Social Connections: Patient Unable To Answer (05/30/2023)  Tobacco Use: Low Risk  (05/28/2023)    Readmission Risk Interventions     No data to display

## 2023-06-06 NOTE — Progress Notes (Signed)
PROGRESS NOTE  Deanna Shea ZOX:[id number] DOB: 1945/09/28 DOA: 05/28/2023 PCP: Darrow Bussing, MD   LOS: 9 days   Brief narrative:  78 years old female with past medical history of atrial fibrillation, hyperlipidemia, type 2 diabetes, hypothyroidism, anxiety, depression, schizophrenia who initially presented to the hospital from assisted living facility with altered mental status and lethargy.  She was also noted to have hyperglycemia with blood glucose levels in the 500s.  In the ED, vitals were stable.  Labs showed hyponatremia, hypokalemia with elevated creatinine and hypomagnesemia.  Troponin was slightly elevated with beta hydroxybutyrate elevation and lactic acid was elevated of 6.5.  Patient was started on broad-spectrum antibiotic for presumed sepsis. CT chest abdomen pelvis showed a 7 mm UVJ calculus with moderate left obstructive hydronephrosis.  There was some gas noted in the bladder and left ureter and collecting system.  Urology was consulted for left ureteral stent placement.  Patient was then admitted to the ICU postoperatively.  Subsequently patient was considered stable for transfer out of ICU to medical service.  At this time, patient has been having episodes of fever despite otherwise clinical improvement.  CT scan of the abdomen was done which showed renal abscess.  IR has been consulted for possible image guided drainage, but this is not amenable to drainage.  Urology continues to follow and repeat blood cultures pending.  Continues to have some low-grade fever.  Assessment and plan.  Principal Problem:   Sepsis due to urinary tract infection (HCC) Active Problems:   AKI (acute kidney injury) (HCC)   Acute encephalopathy   Diabetic ketoacidosis without coma associated with type 2 diabetes mellitus (HCC)   Ureterolithiasis   Septic shock (HCC)   Severe sepsis with septic shock due to complicated E. coli urinary tract infection with obstructive uropathy,  E. coli  bacteremia Lactic acidosis Patient had obstructive uropathy due to obstructing L UVJ calculus and underwent ureteric stent placement by urology on 05/28/2023.  Despite being on appropriate antibiotic patient continued to have spikes of fever temperature max in the last 24 hours at 101.2 F.  Patient was on cefazolin but after discussing with urology today we will  change to vancomycin and cefepime for broader coverage   latest WBC at 8.8.  Will continue to monitor.  Repeat blood and urine urine culture has been again sent.  Initial blood cultures negative in last 3 days.  Urology also recommending Foley catheter. -No growth noted thus far and continues to have mild temperature elevation.  Appreciate further urology recommendations.   Acute respiratory insufficiency, postprocedure, resolved Patient was successfully extubated  currently on room air.  No active issues.   Diabetic ketoacidosis DKA has resolved.  Continue on insulin regimen.  Currently on Semglee  13 units twice daily, Continue sliding scale insulin.  Overall glycemic control has improved.  Latest POC glucose of 129.   Acute metabolic/septic encephalopathy Secondary to sepsis and DKA.  Resolved at this time.   AKI, anion gap metabolic acidosis Resolved.  Latest creatinine at 1.0.  Initial creatinine of 2.4.    Hyponatremia Mild.   Latest sodium of 134.  Will continue to monitor.  Paroxysmal A-fib On sinus rhythm at this time.  Continue aspirin for prophylaxis.   Hypothyroidism Continue home Synthroid   Depression/Anxiety/Schizophrenia Continue risperidone, Lexapro as clinically appropriate   Thrombocytopenia due to critical illness Appears resolved with platelet count of 372.  Recheck CBC in a.m. to ensure that this is accurate.  DVT prophylaxis: heparin injection 5,000 Units  Start: 05/29/23 0600 SCDs Start: 05/28/23 2155   Disposition: Skilled nursing facility as per PT recommendation  Status is: Inpatient  Remains  inpatient appropriate because: IV antibiotics, need for rehabilitation, pending clinical improvement, fevers, new renal abscess    Code Status:     Code Status: Full Code  Family Communication:   Spoke with the patient's son on the phone on 06/02/2023  Consultants: PCCM Urology  Procedures: Left ureteric stent placement by urology 05/28/2023 Foley catheter placement.  Anti-infectives:  Cefazolin IV 06/02/2023>1/27 Cefepime and vanco 1/28>  Anti-infectives (From admission, onward)    Start     Dose/Rate Route Frequency Ordered Stop   06/05/23 0945  ceFEPIme (MAXIPIME) 2 g in sodium chloride 0.9 % 100 mL IVPB        2 g 200 mL/hr over 30 Minutes Intravenous Every 8 hours 06/05/23 0855     06/05/23 0945  vancomycin (VANCOREADY) IVPB 1500 mg/300 mL        1,500 mg 150 mL/hr over 120 Minutes Intravenous Every 24 hours 06/05/23 0855     06/02/23 0600  ceFAZolin (ANCEF) IVPB 2g/100 mL premix  Status:  Discontinued        2 g 200 mL/hr over 30 Minutes Intravenous Every 8 hours 06/01/23 1326 06/05/23 0821   05/31/23 0600  cefTRIAXone (ROCEPHIN) 2 g in sodium chloride 0.9 % 100 mL IVPB  Status:  Discontinued        2 g 200 mL/hr over 30 Minutes Intravenous Every 24 hours 05/30/23 0855 06/01/23 1326   05/30/23 0945  cefTRIAXone (ROCEPHIN) 1 g in sodium chloride 0.9 % 100 mL IVPB        1 g 200 mL/hr over 30 Minutes Intravenous  Once 05/30/23 0856 05/30/23 0937   05/29/23 0600  cefTRIAXone (ROCEPHIN) 1 g in sodium chloride 0.9 % 100 mL IVPB  Status:  Discontinued        1 g 200 mL/hr over 30 Minutes Intravenous Every 24 hours 05/28/23 2204 05/30/23 0855   05/28/23 2127  vancomycin (VANCOCIN) 1-5 GM/200ML-% IVPB       Note to Pharmacy: Brien Mates D: cabinet override      05/28/23 2127 05/29/23 0929   05/28/23 2000  vancomycin (VANCOREADY) IVPB 1500 mg/300 mL  Status:  Discontinued        1,500 mg 150 mL/hr over 120 Minutes Intravenous  Once 05/28/23 1957 05/28/23 2203   05/28/23  1900  Vancomycin (VANCOCIN) 1,500 mg in sodium chloride 0.9 % 500 mL IVPB  Status:  Discontinued        1,500 mg 250 mL/hr over 120 Minutes Intravenous  Once 05/28/23 1853 05/28/23 1958   05/28/23 1845  ceFEPIme (MAXIPIME) 2 g in sodium chloride 0.9 % 100 mL IVPB        2 g 200 mL/hr over 30 Minutes Intravenous  Once 05/28/23 1843 05/28/23 1935   05/28/23 1845  metroNIDAZOLE (FLAGYL) IVPB 500 mg        500 mg 100 mL/hr over 60 Minutes Intravenous  Once 05/28/23 1843 05/28/23 2048   05/28/23 1845  vancomycin (VANCOCIN) IVPB 1000 mg/200 mL premix  Status:  Discontinued        1,000 mg 200 mL/hr over 60 Minutes Intravenous  Once 05/28/23 1843 05/28/23 1853       Subjective: Today, patient was seen and examined at bedside.  Patient denies any specific complaints or concerns.  She is noted to have some mild temperature elevations and repeat blood culture results  are pending.  Objective: Vitals:   06/05/23 2047 06/06/23 0558  BP: (!) 112/50 (!) 125/50  Pulse: 69 70  Resp: 19 19  Temp:  100.2 F (37.9 C)  SpO2: 97% 94%    Intake/Output Summary (Last 24 hours) at 06/06/2023 1000 Last data filed at 06/06/2023 0640 Gross per 24 hour  Intake 600 ml  Output --  Net 600 ml   Filed Weights   05/30/23 0442 06/01/23 0500 06/06/23 0500  Weight: 77.5 kg 79.5 kg 79.4 kg   Body mass index is 27.42 kg/m.   Physical Exam:  GENERAL: Patient is alert awake Not in obvious distress.  Oriented.  Elderly female.  HENT: No scleral pallor or icterus. Pupils equally reactive to light. Oral mucosa is moist NECK: is supple, no gross swelling noted. CHEST: Clear to auscultation. No crackles or wheezes.  CVS: S1 and S2 heard, no murmur. Regular rate and rhythm.  ABDOMEN: Soft, non-tender, bowel sounds are present.  No guarding or rigidity. EXTREMITIES: Trace lower extremity edema. CNS: Cranial nerves are intact.  Moves all extremities.   SKIN: warm and dry without rashes.  Data Review: I have  personally reviewed the following laboratory data and studies,  CBC: Recent Labs  Lab 06/01/23 0437 06/02/23 0417 06/03/23 0412 06/04/23 0416 06/06/23 0510  WBC 6.1 7.6 8.0 8.8 8.3  NEUTROABS  --   --   --   --  6.5  HGB 11.8* 11.4* 11.5* 12.5 10.9*  HCT 34.8* 34.0* 34.2* 38.1 34.0*  MCV 87.2 87.9 87.2 90.3 90.9  PLT 82* 102* 153 120* 372   Basic Metabolic Panel: Recent Labs  Lab 06/01/23 0437 06/02/23 0417 06/03/23 0412 06/04/23 0416 06/06/23 0510  NA 138 138 138 133* 134*  K 3.7 3.5 3.3* 4.6 3.9  CL 105 106 105 103 105  CO2 24 23 25  20* 23  GLUCOSE 174* 112* 115* 145* 134*  BUN 22 22 21 23 14   CREATININE 1.18* 1.15* 0.98 1.01* 0.88  CALCIUM 8.7* 7.9* 7.5* 7.5* 7.6*  MG 1.7 1.9 1.8 1.8 1.8  PHOS 1.6*  --  4.1  --   --    Liver Function Tests: Recent Labs  Lab 06/02/23 0417  AST 42*  ALT 30  ALKPHOS 133*  BILITOT 0.8  PROT 4.7*  ALBUMIN 1.7*   No results for input(s): "LIPASE", "AMYLASE" in the last 168 hours. No results for input(s): "AMMONIA" in the last 168 hours.  Cardiac Enzymes: No results for input(s): "CKTOTAL", "CKMB", "CKMBINDEX", "TROPONINI" in the last 168 hours.  BNP (last 3 results) Recent Labs    05/28/23 1820 06/03/23 0412  BNP 89.3 160.2*    ProBNP (last 3 results) No results for input(s): "PROBNP" in the last 8760 hours.  CBG: Recent Labs  Lab 06/05/23 0812 06/05/23 1217 06/05/23 1619 06/05/23 2048 06/06/23 0815  GLUCAP 160* 129* 110* 186* 129*   Recent Results (from the past 240 hours)  Blood Culture (routine x 2)     Status: Abnormal   Collection Time: 05/28/23  6:17 PM   Specimen: BLOOD  Result Value Ref Range Status   Specimen Description BLOOD SITE NOT SPECIFIED  Final   Special Requests   Final    BOTTLES DRAWN AEROBIC AND ANAEROBIC Blood Culture results may not be optimal due to an inadequate volume of blood received in culture bottles   Culture  Setup Time   Final    GRAM NEGATIVE RODS IN BOTH AEROBIC AND  ANAEROBIC BOTTLES CRITICAL RESULT  CALLED TO, READ BACK BY AND VERIFIED WITH: Gwendolyn Fill A [id number] FCP Performed at [facility], [address] [place], [place] [address]    Culture ESCHERICHIA COLI (A)  Final   Report Status 05/31/2023 FINAL  Final   Organism ID, Bacteria ESCHERICHIA COLI  Final   Organism ID, Bacteria ESCHERICHIA COLI  Final      Susceptibility   Escherichia coli - KIRBY BAUER*    CEFAZOLIN SENSITIVE Sensitive    Escherichia coli - MIC*    AMPICILLIN <=2 SENSITIVE Sensitive     CEFEPIME <=0.12 SENSITIVE Sensitive     CEFTAZIDIME <=1 SENSITIVE Sensitive     CEFTRIAXONE <=0.25 SENSITIVE Sensitive     CIPROFLOXACIN <=0.25 SENSITIVE Sensitive     GENTAMICIN <=1 SENSITIVE Sensitive     IMIPENEM <=0.25 SENSITIVE Sensitive     TRIMETH/SULFA <=20 SENSITIVE Sensitive     AMPICILLIN/SULBACTAM <=2 SENSITIVE Sensitive     PIP/TAZO <=4 SENSITIVE Sensitive ug/mL    * ESCHERICHIA COLI    ESCHERICHIA COLI  Blood Culture ID Panel (Reflexed)     Status: Abnormal   Collection Time: 05/28/23  6:17 PM  Result Value Ref Range Status   Enterococcus faecalis NOT DETECTED NOT DETECTED Final   Enterococcus Faecium NOT DETECTED NOT DETECTED Final   Listeria monocytogenes NOT DETECTED NOT DETECTED Final   Staphylococcus species NOT DETECTED NOT DETECTED Final   Staphylococcus aureus (BCID) NOT DETECTED NOT DETECTED Final   Staphylococcus epidermidis NOT DETECTED NOT DETECTED Final   Staphylococcus lugdunensis NOT DETECTED NOT DETECTED Final   Streptococcus species NOT DETECTED NOT DETECTED Final   Streptococcus agalactiae NOT DETECTED NOT DETECTED Final   Streptococcus pneumoniae NOT DETECTED NOT DETECTED Final   Streptococcus pyogenes NOT DETECTED NOT DETECTED Final   A.calcoaceticus-baumannii NOT DETECTED NOT DETECTED Final   Bacteroides fragilis NOT DETECTED NOT DETECTED Final   Enterobacterales DETECTED (A) NOT DETECTED Final    Comment: Enterobacterales represent a  large order of gram negative bacteria, not a single organism. CRITICAL RESULT CALLED TO, READ BACK BY AND VERIFIED WITH: PHARMD BAILEY A [id number] FCP    Enterobacter cloacae complex NOT DETECTED NOT DETECTED Final   Escherichia coli DETECTED (A) NOT DETECTED Final    Comment: CRITICAL RESULT CALLED TO, READ BACK BY AND VERIFIED WITH: PHARMD BAILEY A [phone] FCP    Klebsiella aerogenes NOT DETECTED NOT DETECTED Final   Klebsiella oxytoca NOT DETECTED NOT DETECTED Final   Klebsiella pneumoniae NOT DETECTED NOT DETECTED Final   Proteus species NOT DETECTED NOT DETECTED Final   Salmonella species NOT DETECTED NOT DETECTED Final   Serratia marcescens NOT DETECTED NOT DETECTED Final   Haemophilus influenzae NOT DETECTED NOT DETECTED Final   Neisseria meningitidis NOT DETECTED NOT DETECTED Final   Pseudomonas aeruginosa NOT DETECTED NOT DETECTED Final   Stenotrophomonas maltophilia NOT DETECTED NOT DETECTED Final   Candida albicans NOT DETECTED NOT DETECTED Final   Candida auris NOT DETECTED NOT DETECTED Final   Candida glabrata NOT DETECTED NOT DETECTED Final   Candida krusei NOT DETECTED NOT DETECTED Final   Candida parapsilosis NOT DETECTED NOT DETECTED Final   Candida tropicalis NOT DETECTED NOT DETECTED Final   Cryptococcus neoformans/gattii NOT DETECTED NOT DETECTED Final   CTX-M ESBL NOT DETECTED NOT DETECTED Final   Carbapenem resistance IMP NOT DETECTED NOT DETECTED Final   Carbapenem resistance KPC NOT DETECTED NOT DETECTED Final   Carbapenem resistance NDM NOT DETECTED NOT DETECTED Final   Carbapenem resist OXA 48  LIKE NOT DETECTED NOT DETECTED Final   Carbapenem resistance VIM NOT DETECTED NOT DETECTED Final    Comment: Performed at [facility], [address], [place], [place] [address]  Resp panel by RT-PCR (RSV, Flu A&B, Covid) Anterior Nasal Swab     Status: None   Collection Time: 05/28/23  6:18 PM   Specimen: Anterior Nasal Swab  Result Value Ref Range  Status   SARS Coronavirus 2 by RT PCR NEGATIVE NEGATIVE Final   Influenza A by PCR NEGATIVE NEGATIVE Final   Influenza B by PCR NEGATIVE NEGATIVE Final    Comment: (NOTE) The Xpert Xpress SARS-CoV-2/FLU/RSV plus assay is intended as an aid in the diagnosis of influenza from Nasopharyngeal swab specimens and should not be used as a sole basis for treatment. Nasal washings and aspirates are unacceptable for Xpert Xpress SARS-CoV-2/FLU/RSV testing.  Fact Sheet for Patients: BloggerCourse.com  Fact Sheet for Healthcare Providers: SeriousBroker.it  This test is not yet approved or cleared by the [place] FDA and has been authorized for detection and/or diagnosis of SARS-CoV-2 by FDA under an Emergency Use Authorization (EUA). This EUA will remain in effect (meaning this test can be used) for the duration of the COVID-19 declaration under Section 564(b)(1) of the Act, 21 U.S.C. section 360bbb-3(b)(1), unless the authorization is terminated or revoked.     Resp Syncytial Virus by PCR NEGATIVE NEGATIVE Final    Comment: (NOTE) Fact Sheet for Patients: BloggerCourse.com  Fact Sheet for Healthcare Providers: SeriousBroker.it  This test is not yet approved or cleared by the [place] FDA and has been authorized for detection and/or diagnosis of SARS-CoV-2 by FDA under an Emergency Use Authorization (EUA). This EUA will remain in effect (meaning this test can be used) for the duration of the COVID-19 declaration under Section 564(b)(1) of the Act, 21 U.S.C. section 360bbb-3(b)(1), unless the authorization is terminated or revoked.  Performed at [facility], [address], [place], [place] [address]   Blood Culture (routine x 2)     Status: Abnormal   Collection Time: 05/28/23  7:00 PM   Specimen: BLOOD  Result Value Ref Range Status   Specimen Description BLOOD SITE  NOT SPECIFIED  Final   Special Requests   Final    BOTTLES DRAWN AEROBIC AND ANAEROBIC Blood Culture results may not be optimal due to an inadequate volume of blood received in culture bottles   Culture  Setup Time   Final    GRAM NEGATIVE RODS IN BOTH AEROBIC AND ANAEROBIC BOTTLES CRITICAL VALUE NOTED.  VALUE IS CONSISTENT WITH PREVIOUSLY REPORTED AND CALLED VALUE.    Culture (A)  Final    ESCHERICHIA COLI SUSCEPTIBILITIES PERFORMED ON PREVIOUS CULTURE WITHIN THE LAST 5 DAYS. Performed at [facility], [address] [place], [place] [address]    Report Status 05/31/2023 FINAL  Final  Urine Culture     Status: Abnormal   Collection Time: 05/28/23 10:00 PM   Specimen: Urine, Cystoscope  Result Value Ref Range Status   Specimen Description CYSTOSCOPY  Final   Special Requests   Final    NONE Performed at [facility], [address], [place], [place] [address]    Culture >=100,000 COLONIES/mL ESCHERICHIA COLI (A)  Final   Report Status 05/30/2023 FINAL  Final   Organism ID, Bacteria ESCHERICHIA COLI (A)  Final      Susceptibility   Escherichia coli - MIC*    AMPICILLIN 4 SENSITIVE Sensitive  CEFEPIME <=0.12 SENSITIVE Sensitive     CEFTAZIDIME <=1 SENSITIVE Sensitive     CEFTRIAXONE <=0.25 SENSITIVE Sensitive     CIPROFLOXACIN <=0.25 SENSITIVE Sensitive     GENTAMICIN <=1 SENSITIVE Sensitive     IMIPENEM <=0.25 SENSITIVE Sensitive     TRIMETH/SULFA <=20 SENSITIVE Sensitive     AMPICILLIN/SULBACTAM <=2 SENSITIVE Sensitive     PIP/TAZO <=4 SENSITIVE Sensitive ug/mL    * >=100,000 COLONIES/mL ESCHERICHIA COLI  MRSA Next Gen by PCR, Nasal     Status: None   Collection Time: 05/28/23 10:04 PM   Specimen: Nasal Mucosa; Nasal Swab  Result Value Ref Range Status   MRSA by PCR Next Gen NOT DETECTED NOT DETECTED Final    Comment: (NOTE) The GeneXpert MRSA Assay (FDA approved for NASAL specimens only), is one component of a comprehensive MRSA colonization  surveillance program. It is not intended to diagnose MRSA infection nor to guide or monitor treatment for MRSA infections. Test performance is not FDA approved in patients less than 40 years old. Performed at [facility], [address] [place], [place] [address]   Culture, blood (Routine X 2) w Reflex to ID Panel     Status: None (Preliminary result)   Collection Time: 06/02/23  9:18 PM   Specimen: BLOOD LEFT HAND  Result Value Ref Range Status   Specimen Description BLOOD LEFT HAND  Final   Special Requests   Final    BOTTLES DRAWN AEROBIC AND ANAEROBIC Blood Culture results may not be optimal due to an inadequate volume of blood received in culture bottles   Culture   Final    NO GROWTH 4 DAYS Performed at [facility], [address] [place], [place] [address]    Report Status PENDING  Incomplete  Culture, blood (Routine X 2) w Reflex to ID Panel     Status: None (Preliminary result)   Collection Time: 06/02/23  9:25 PM   Specimen: BLOOD LEFT HAND  Result Value Ref Range Status   Specimen Description BLOOD LEFT HAND  Final   Special Requests   Final    BOTTLES DRAWN AEROBIC AND ANAEROBIC Blood Culture results may not be optimal due to an inadequate volume of blood received in culture bottles   Culture   Final    NO GROWTH 4 DAYS Performed at [facility], [address], [place], [place] [address]    Report Status PENDING  Incomplete  Culture, blood (Routine X 2) w Reflex to ID Panel     Status: None (Preliminary result)   Collection Time: 06/05/23  7:48 AM   Specimen: BLOOD LEFT ARM  Result Value Ref Range Status   Specimen Description BLOOD LEFT ARM  Final   Special Requests   Final    BOTTLES DRAWN AEROBIC AND ANAEROBIC Blood Culture adequate volume   Culture   Final    NO GROWTH < 24 HOURS Performed at [facility], [address] [place], [place] [address]    Report Status PENDING  Incomplete  Culture, blood (Routine X 2) w Reflex to ID  Panel     Status: None (Preliminary result)   Collection Time: 06/05/23  7:48 AM   Specimen: BLOOD LEFT HAND  Result Value Ref Range Status   Specimen Description BLOOD LEFT HAND  Final   Special Requests   Final    BOTTLES DRAWN AEROBIC AND ANAEROBIC Blood Culture adequate volume   Culture   Final  NO GROWTH < 24 HOURS Performed at [facility], [address] [place], [place] [address]    Report Status PENDING  Incomplete     Studies: CT ABDOMEN PELVIS W CONTRAST Result Date: 06/05/2023 CLINICAL DATA:  Abdominal and flank pain, hematuria. EXAM: CT ABDOMEN AND PELVIS WITH CONTRAST TECHNIQUE: Multidetector CT imaging of the abdomen and pelvis was performed using the standard protocol following bolus administration of intravenous contrast. RADIATION DOSE REDUCTION: This exam was performed according to the departmental dose-optimization program which includes automated exposure control, adjustment of the mA and/or kV according to patient size and/or use of iterative reconstruction technique. CONTRAST:  75mL OMNIPAQUE IOHEXOL 350 MG/ML SOLN COMPARISON:  CT chest abdomen and pelvis 05/28/2023 FINDINGS: Lower chest: There are new small bilateral pleural effusions with bibasilar atelectasis. Hepatobiliary: Gallstones are present. There is no biliary ductal dilatation. The liver is within normal limits. Pancreas: Unremarkable. No pancreatic ductal dilatation or surrounding inflammatory changes. Spleen: Normal in size without focal abnormality. Adrenals/Urinary Tract: A new left ureteral stent is in place. There is no hydronephrosis. The left kidney is enlarged with mild perinephric fat stranding. Striated nephrogram appearance of the left kidney. There are patchy areas of hypodensity throughout the left kidney, many of which are seen peripherally. Dominant area is seen posteriorly on image 3/37 measuring 2.4 x 1.9 by 2.2 cm. Second largest areas seen anteriorly measuring 2.1 by 2.0 by 1.0 cm image  3/40. Left renal calculi are again seen measuring up to 10 mm. No definite ureteral or bladder calculi are identified. The right kidney and adrenal glands are within normal limits. Stomach/Bowel: There is a small hiatal hernia. Stomach is within normal limits. Appendix is not seen. Small bowel anastomosis is present in the right lower quadrant. No evidence of bowel wall thickening, distention, or inflammatory changes. Vascular/Lymphatic: No significant vascular findings are present. No enlarged abdominal or pelvic lymph nodes. Reproductive: Uterus and bilateral adnexa are unremarkable. Other: Presacral edema is new from prior. There is new body wall edema. There is no ascites. There some bulging of the anterior abdominal wall, unchanged. Musculoskeletal: Degenerative changes affect the spine. IMPRESSION: 1. New left ureteral stent in place. No hydronephrosis. 2. Enlarged left kidney with striated nephrogram appearance and perinephric fat stranding. Findings are compatible with pyelonephritis. 3. Patchy areas of hypodensity throughout the left kidney worrisome for renal abscesses. 4. Left nonobstructing renal calculi. 5. New small bilateral pleural effusions with bibasilar atelectasis. 6. New body wall edema and presacral edema. 7. Cholelithiasis. 8. Bosniak I benign renal cyst measuring 2.4 cm. No follow-up imaging is recommended. JACR 2018 Feb; [id number], Management of the Incidental Renal Mass on CT, [employer] 2021; [id number], Bosniak Classification of Cystic Renal Masses, Version 2019. Electronically Signed   By: Darliss Cheney M.D.   On: 06/05/2023 01:18    Total care time: 55 minutes.  Estaban Main[place] D Sherryll Burger, DO [facility] 06/06/2023  If 7PM-7AM, please contact night-coverage

## 2023-06-06 NOTE — Plan of Care (Signed)
Patient alert/oriented X2-3. Patient compliant with medication administration and tolerated IV antibiotics. Patient refused to move from bed to chair this shift. Will reassess tomorrow. VSS, no complaints at this time., Foley catheter intact.  Problem: Clinical Measurements: Goal: Diagnostic test results will improve Outcome: Progressing   Problem: Clinical Measurements: Goal: Respiratory complications will improve Outcome: Progressing   Problem: Clinical Measurements: Goal: Cardiovascular complication will be avoided Outcome: Progressing   Problem: Activity: Goal: Risk for activity intolerance will decrease Outcome: Progressing   Problem: Nutrition: Goal: Adequate nutrition will be maintained Outcome: Progressing   Problem: Coping: Goal: Level of anxiety will decrease Outcome: Progressing   Problem: Elimination: Goal: Will not experience complications related to bowel motility Outcome: Progressing   Problem: Elimination: Goal: Will not experience complications related to urinary retention Outcome: Progressing   Problem: Pain Managment: Goal: General experience of comfort will improve and/or be controlled Outcome: Progressing   Problem: Safety: Goal: Ability to remain free from injury will improve Outcome: Progressing   Problem: Skin Integrity: Goal: Risk for impaired skin integrity will decrease Outcome: Progressing   Problem: Respiratory: Goal: Ability to maintain a clear airway and adequate ventilation will improve Outcome: Progressing   Problem: Role Relationship: Goal: Method of communication will improve Outcome: Progressing   Problem: Education: Goal: Ability to describe self-care measures that may prevent or decrease complications (Diabetes Survival Skills Education) will improve Outcome: Progressing   Problem: Education: Goal: Individualized Educational Video(s) Outcome: Progressing   Problem: Coping: Goal: Ability to adjust to condition or  change in health will improve Outcome: Progressing   Problem: Fluid Volume: Goal: Ability to maintain a balanced intake and output will improve Outcome: Progressing   Problem: Health Behavior/Discharge Planning: Goal: Ability to identify and utilize available resources and services will improve Outcome: Progressing   Problem: Health Behavior/Discharge Planning: Goal: Ability to manage health-related needs will improve Outcome: Progressing   Problem: Metabolic: Goal: Ability to maintain appropriate glucose levels will improve Outcome: Progressing   Problem: Nutritional: Goal: Maintenance of adequate nutrition will improve Outcome: Progressing   Problem: Nutritional: Goal: Progress toward achieving an optimal weight will improve Outcome: Progressing   Problem: Skin Integrity: Goal: Risk for impaired skin integrity will decrease Outcome: Progressing   Problem: Tissue Perfusion: Goal: Adequacy of tissue perfusion will improve Outcome: Progressing

## 2023-06-06 NOTE — Progress Notes (Signed)
Physical Therapy Treatment Patient Details Name: Deanna Shea MRN: [id number] DOB: 10-Dec-1945 Today's Date: 06/06/2023   History of Present Illness 78 year old woman who presented to [facility] ED 1/20 for AMS and lethargy.  Found to have ?DKA, urosepsis.ObstructedL UVJ calculus with obstructive uropathy. Uro consulted. Taken to OR  05/28/23 for L ureteral stent. PMHx significant for Afib (on ASA/digoxin/propranolol), mitral valve prolapse, HLD, T2DM, nephrolithiasis, hypothyroidism, anxiety/depression, schizophrenia.    PT Comments  Pt seen for PT tx with pt reluctantly agreeable. Pt declines OOB mobility but agreeable to bed level exercises. Pt requires cuing for technique & encouragement for engagement. Pt also requires rest break during each set of 10 reps. After 2 exercises pt declined further activity. Will continue to follow pt acutely to address strengthening, balance, endurance to increase independence with OOB mobility.    If plan is discharge home, recommend the following: A lot of help with bathing/dressing/bathroom;Two people to help with walking and/or transfers;Assistance with cooking/housework;Direct supervision/assist for medications management;Direct supervision/assist for financial management;Assist for transportation;Supervision due to cognitive status   Can travel by private vehicle     No  Equipment Recommendations  Other (comment) (defer to next venue)    Recommendations for Other Services       Precautions / Restrictions Precautions Precautions: Fall Precaution Comments: slowed responses, but will participate Restrictions Weight Bearing Restrictions Per Provider Order: No     Mobility  Bed Mobility                    Transfers                        Ambulation/Gait                   Stairs             Wheelchair Mobility     Tilt Bed    Modified Rankin (Stroke Patients Only)       Balance                                             Cognition Arousal: Alert Behavior During Therapy: Flat affect                           Following Commands: Follows one step commands inconsistently, Follows one step commands with increased time     Problem Solving: Slow processing, Decreased initiation, Requires verbal cues, Requires tactile cues          Exercises General Exercises - Lower Extremity Heel Slides: AROM, Supine, Strengthening, Both, 10 reps Hip ABduction/ADduction: AROM, Supine, Strengthening, Both, 10 reps (hip adduction pillow squeezes)    General Comments        Pertinent Vitals/Pain Pain Assessment Pain Assessment: No/denies pain    Home Living                          Prior Function            PT Goals (current goals can now be found in the care plan section) Acute Rehab PT Goals Patient Stated Goal: Get well PT Goal Formulation: With patient Time For Goal Achievement: 06/14/23 Potential to Achieve Goals: Fair Progress towards PT goals: Progressing toward goals    Frequency    Min 1X/week  PT Plan      Co-evaluation              AM-PAC PT "6 Clicks" Mobility   Outcome Measure  Help needed turning from your back to your side while in a flat bed without using bedrails?: A Little Help needed moving from lying on your back to sitting on the side of a flat bed without using bedrails?: A Lot Help needed moving to and from a bed to a chair (including a wheelchair)?: A Lot Help needed standing up from a chair using your arms (e.g., wheelchair or bedside chair)?: A Lot Help needed to walk in hospital room?: Total Help needed climbing 3-5 steps with a railing? : Total 6 Click Score: 11    End of Session   Activity Tolerance: Patient tolerated treatment well;Patient limited by fatigue Patient left: in bed;with call bell/phone within reach;with bed alarm set;with SCD's reapplied Nurse Communication: Mobility status PT  Visit Diagnosis: Unsteadiness on feet (R26.81);Other symptoms and signs involving the nervous system (R29.898);Difficulty in walking, not elsewhere classified (R26.2);History of falling (Z91.81);Muscle weakness (generalized) (M62.81);Other abnormalities of gait and mobility (R26.89)     Time: 1610-9604 PT Time Calculation (min) (ACUTE ONLY): 9 min  Charges:    $Therapeutic Activity: 8-22 mins PT General Charges $$ ACUTE PT VISIT: 1 Visit                     Aleda Grana, PT, DPT 06/06/23, 2:26 PM   Sandi Mariscal 06/06/2023, 2:25 PM

## 2023-06-07 DIAGNOSIS — R7881 Bacteremia: Secondary | ICD-10-CM | POA: Diagnosis not present

## 2023-06-07 DIAGNOSIS — N151 Renal and perinephric abscess: Secondary | ICD-10-CM

## 2023-06-07 DIAGNOSIS — A419 Sepsis, unspecified organism: Secondary | ICD-10-CM | POA: Diagnosis not present

## 2023-06-07 DIAGNOSIS — N12 Tubulo-interstitial nephritis, not specified as acute or chronic: Secondary | ICD-10-CM

## 2023-06-07 DIAGNOSIS — N390 Urinary tract infection, site not specified: Secondary | ICD-10-CM | POA: Diagnosis not present

## 2023-06-07 DIAGNOSIS — B9620 Unspecified Escherichia coli [E. coli] as the cause of diseases classified elsewhere: Secondary | ICD-10-CM | POA: Diagnosis not present

## 2023-06-07 LAB — CBC
HCT: 34.2 % — ABNORMAL LOW (ref 36.0–46.0)
Hemoglobin: 10.9 g/dL — ABNORMAL LOW (ref 12.0–15.0)
MCH: 28.9 pg (ref 26.0–34.0)
MCHC: 31.9 g/dL (ref 30.0–36.0)
MCV: 90.7 fL (ref 80.0–100.0)
Platelets: 435 10*3/uL — ABNORMAL HIGH (ref 150–400)
RBC: 3.77 MIL/uL — ABNORMAL LOW (ref 3.87–5.11)
RDW: 13.8 % (ref 11.5–15.5)
WBC: 7.6 10*3/uL (ref 4.0–10.5)
nRBC: 0 % (ref 0.0–0.2)

## 2023-06-07 LAB — BASIC METABOLIC PANEL
Anion gap: 7 (ref 5–15)
BUN: 13 mg/dL (ref 8–23)
CO2: 22 mmol/L (ref 22–32)
Calcium: 7.8 mg/dL — ABNORMAL LOW (ref 8.9–10.3)
Chloride: 107 mmol/L (ref 98–111)
Creatinine, Ser: 0.75 mg/dL (ref 0.44–1.00)
GFR, Estimated: >60 mL/min (ref >60)
Glucose, Bld: 114 mg/dL — ABNORMAL HIGH (ref 70–99)
Potassium: 3.4 mmol/L — ABNORMAL LOW (ref 3.5–5.1)
Sodium: 136 mmol/L (ref 135–145)

## 2023-06-07 LAB — CULTURE, BLOOD (ROUTINE X 2)
Culture: NO GROWTH
Culture: NO GROWTH

## 2023-06-07 LAB — GLUCOSE, CAPILLARY
Glucose-Capillary: 108 mg/dL — ABNORMAL HIGH (ref 70–99)
Glucose-Capillary: 172 mg/dL — ABNORMAL HIGH (ref 70–99)
Glucose-Capillary: 91 mg/dL (ref 70–99)
Glucose-Capillary: 163 mg/dL — ABNORMAL HIGH (ref 70–99)

## 2023-06-07 LAB — MAGNESIUM: Magnesium: 1.8 mg/dL (ref 1.7–2.4)

## 2023-06-07 MED ORDER — POTASSIUM CHLORIDE CRYS ER 20 MEQ PO TBCR
40.0000 meq | EXTENDED_RELEASE_TABLET | Freq: Once | ORAL | Status: AC
  Administered 2023-06-07: 40 meq via ORAL
  Filled 2023-06-07: qty 2

## 2023-06-07 MED ORDER — CEFAZOLIN SODIUM-DEXTROSE 2-4 GM/100ML-% IV SOLN
2.0000 g | Freq: Three times a day (TID) | INTRAVENOUS | Status: DC
  Administered 2023-06-07 – 2023-06-09 (×6): 2 g via INTRAVENOUS
  Filled 2023-06-07 (×6): qty 100

## 2023-06-07 NOTE — Plan of Care (Signed)
Patient alert/oriented X2-3. Patient was up in the chair for a few hours this shift and tolerated IV antibiotics. Patient compliant with medication administration and foley catheter remains clean and intact. SCD's remain on, VSS, no complaints at this time.  Problem: Clinical Measurements: Goal: Diagnostic test results will improve Outcome: Progressing   Problem: Clinical Measurements: Goal: Respiratory complications will improve Outcome: Progressing   Problem: Clinical Measurements: Goal: Cardiovascular complication will be avoided Outcome: Progressing   Problem: Activity: Goal: Risk for activity intolerance will decrease Outcome: Progressing   Problem: Nutrition: Goal: Adequate nutrition will be maintained Outcome: Progressing   Problem: Coping: Goal: Level of anxiety will decrease Outcome: Progressing   Problem: Elimination: Goal: Will not experience complications related to bowel motility Outcome: Progressing   Problem: Elimination: Goal: Will not experience complications related to urinary retention Outcome: Progressing   Problem: Pain Managment: Goal: General experience of comfort will improve and/or be controlled Outcome: Progressing   Problem: Safety: Goal: Ability to remain free from injury will improve Outcome: Progressing   Problem: Skin Integrity: Goal: Risk for impaired skin integrity will decrease Outcome: Progressing   Problem: Activity: Goal: Ability to tolerate increased activity will improve Outcome: Progressing   Problem: Respiratory: Goal: Ability to maintain a clear airway and adequate ventilation will improve Outcome: Progressing   Problem: Role Relationship: Goal: Method of communication will improve Outcome: Progressing   Problem: Education: Goal: Ability to describe self-care measures that may prevent or decrease complications (Diabetes Survival Skills Education) will improve Outcome: Progressing   Problem: Education: Goal:  Individualized Educational Video(s) Outcome: Progressing   Problem: Coping: Goal: Ability to adjust to condition or change in health will improve Outcome: Progressing   Problem: Fluid Volume: Goal: Ability to maintain a balanced intake and output will improve Outcome: Progressing   Problem: Health Behavior/Discharge Planning: Goal: Ability to identify and utilize available resources and services will improve Outcome: Progressing   Problem: Health Behavior/Discharge Planning: Goal: Ability to manage health-related needs will improve Outcome: Progressing   Problem: Metabolic: Goal: Ability to maintain appropriate glucose levels will improve Outcome: Progressing   Problem: Nutritional: Goal: Maintenance of adequate nutrition will improve Outcome: Progressing   Problem: Nutritional: Goal: Progress toward achieving an optimal weight will improve Outcome: Progressing   Problem: Skin Integrity: Goal: Risk for impaired skin integrity will decrease Outcome: Progressing   Problem: Tissue Perfusion: Goal: Adequacy of tissue perfusion will improve Outcome: Progressing

## 2023-06-07 NOTE — Progress Notes (Addendum)
PROGRESS NOTE  Deanna Shea HYQ:[id number] DOB: 1945-10-07 DOA: 05/28/2023 PCP: Darrow Bussing, MD   LOS: 10 days   Brief narrative:  78 years old female with past medical history of atrial fibrillation, hyperlipidemia, type 2 diabetes, hypothyroidism, anxiety, depression, schizophrenia who initially presented to the hospital from assisted living facility with altered mental status and lethargy.  She was also noted to have hyperglycemia with blood glucose levels in the 500s.  In the ED, vitals were stable.  Labs showed hyponatremia, hypokalemia with elevated creatinine and hypomagnesemia.  Troponin was slightly elevated with beta hydroxybutyrate elevation and lactic acid was elevated of 6.5.  Patient was started on broad-spectrum antibiotic for presumed sepsis. CT chest abdomen pelvis showed a 7 mm UVJ calculus with moderate left obstructive hydronephrosis.  There was some gas noted in the bladder and left ureter and collecting system.  Urology was consulted for left ureteral stent placement.  Patient was then admitted to the ICU postoperatively.  Subsequently patient was considered stable for transfer out of ICU to medical service.  Hospital stay complicated by episodes of fever despite otherwise clinical improvement.  CT scan of the abdomen was done which showed renal abscess.  IR has been consulted for possible image guided drainage, but this is not amenable to drainage.  Urology continues to follow and repeat blood cultures pending.  Continues to have some low-grade fever.  Assessment and plan.  Principal Problem:   Sepsis due to urinary tract infection (HCC) Active Problems:   AKI (acute kidney injury) (HCC)   Acute encephalopathy   Diabetic ketoacidosis without coma associated with type 2 diabetes mellitus (HCC)   Ureterolithiasis   Septic shock (HCC)   Severe sepsis with septic shock due to complicated E. coli urinary tract infection with obstructive uropathy,  E. coli bacteremia  1/20 (cultures since have been negative) Lactic acidosis Patient had obstructive uropathy due to obstructing L UVJ calculus and underwent ureteric stent placement by urology on 05/28/2023.  Despite being on appropriate antibiotic patient continued to have spikes of fever temperature max in the last 24 hours at 101.2 F.  Patient was on cefazolin but after discussing with urology today we will  change to vancomycin and cefepime for broader coverage   latest WBC at 8.8.  Will continue to monitor.  Repeat blood and urine urine culture has been again sent-- NGTD -Initial blood cultures negative in last 3 days.  Urology also recommending Foley catheter. -No growth noted thus far and continues to have mild temperature elevation.  Appreciate further urology recommendations.   Acute respiratory insufficiency, postprocedure, resolved Patient was successfully extubated  currently on room air.  No active issues.   Diabetic ketoacidosis DKA has resolved.  Continue on insulin regimen.  Currently on Semglee  13 units twice daily, Continue sliding scale insulin.  Overall glycemic control has improved   Acute metabolic/septic encephalopathy Secondary to sepsis and DKA.  Resolved at this time.   Hypokalemia -replete  AKI, anion gap metabolic acidosis Resolved.  -  Initial creatinine of 2.4.    Hyponatremia Mild and resolved  Paroxysmal A-fib On sinus rhythm at this time -on ASA only   Hypothyroidism Continue home Synthroid   Depression/Anxiety/Schizophrenia Continue risperidone, Lexapro as clinically appropriate   Thrombocytopenia due to critical illness Appears resolve  DVT prophylaxis: heparin injection 5,000 Units Start: 05/29/23 0600 SCDs Start: 05/28/23 2155   Disposition: Skilled nursing facility as per PT recommendation  Status is: Inpatient  Remains inpatient appropriate because: IV antibiotics, need for  rehabilitation, pending clinical improvement, fevers, new renal abscess     Code Status:     Code Status: Full Code    Consultants: PCCM Urology  Procedures: Left ureteric stent placement by urology 05/28/2023 Foley catheter placement.     Subjective: Thirsty but no other complaints   Objective: Vitals:   06/07/23 0433 06/07/23 0825  BP: (!) 122/54 (!) 111/53  Pulse: 65 66  Resp: 18   Temp: 98.9 F (37.2 C) 98 F (36.7 C)  SpO2: 95% 94%    Intake/Output Summary (Last 24 hours) at 06/07/2023 1037 Last data filed at 06/07/2023 0636 Gross per 24 hour  Intake --  Output 2400 ml  Net -2400 ml   Filed Weights   06/01/23 0500 06/06/23 0500 06/07/23 0431  Weight: 79.5 kg 79.4 kg 78.7 kg   Body mass index is 27.17 kg/m.   Physical Exam:   General: Appearance:     Overweight adult in no acute distress     Lungs:     respirations unlabored  Heart:    Normal heart rate.   MS:   All extremities are intact.   Neurologic:   Awake, alert     Data Review: I have personally reviewed the following laboratory data and studies,  CBC: Recent Labs  Lab 06/02/23 0417 06/03/23 0412 06/04/23 0416 06/06/23 0510 06/07/23 0410  WBC 7.6 8.0 8.8 8.3 7.6  NEUTROABS  --   --   --  6.5  --   HGB 11.4* 11.5* 12.5 10.9* 10.9*  HCT 34.0* 34.2* 38.1 34.0* 34.2*  MCV 87.9 87.2 90.3 90.9 90.7  PLT 102* 153 120* 372 435*   Basic Metabolic Panel: Recent Labs  Lab 06/01/23 0437 06/02/23 0417 06/03/23 0412 06/04/23 0416 06/06/23 0510 06/07/23 0410  NA 138 138 138 133* 134* 136  K 3.7 3.5 3.3* 4.6 3.9 3.4*  CL 105 106 105 103 105 107  CO2 24 23 25  20* 23 22  GLUCOSE 174* 112* 115* 145* 134* 114*  BUN 22 22 21 23 14 13   CREATININE 1.18* 1.15* 0.98 1.01* 0.88 0.75  CALCIUM 8.7* 7.9* 7.5* 7.5* 7.6* 7.8*  MG 1.7 1.9 1.8 1.8 1.8 1.8  PHOS 1.6*  --  4.1  --   --   --    Liver Function Tests: Recent Labs  Lab 06/02/23 0417  AST 42*  ALT 30  ALKPHOS 133*  BILITOT 0.8  PROT 4.7*  ALBUMIN 1.7*   No results for input(s): "LIPASE", "AMYLASE"  in the last 168 hours. No results for input(s): "AMMONIA" in the last 168 hours.  Cardiac Enzymes: No results for input(s): "CKTOTAL", "CKMB", "CKMBINDEX", "TROPONINI" in the last 168 hours.  BNP (last 3 results) Recent Labs    05/28/23 1820 06/03/23 0412  BNP 89.3 160.2*    ProBNP (last 3 results) No results for input(s): "PROBNP" in the last 8760 hours.  CBG: Recent Labs  Lab 06/06/23 0815 06/06/23 1157 06/06/23 1622 06/06/23 2024 06/07/23 0824  GLUCAP 129* 165* 101* 179* 108*   Recent Results (from the past 240 hours)  Blood Culture (routine x 2)     Status: Abnormal   Collection Time: 05/28/23  6:17 PM   Specimen: BLOOD  Result Value Ref Range Status   Specimen Description BLOOD SITE NOT SPECIFIED  Final   Special Requests   Final    BOTTLES DRAWN AEROBIC AND ANAEROBIC Blood Culture results may not be optimal due to an inadequate volume of blood received in culture  bottles   Culture  Setup Time   Final    GRAM NEGATIVE RODS IN BOTH AEROBIC AND ANAEROBIC BOTTLES CRITICAL RESULT CALLED TO, READ BACK BY AND VERIFIED WITH: Gwendolyn Fill A [id number] FCP Performed at [facility], [address], [place], [place] [address]    Culture ESCHERICHIA COLI (A)  Final   Report Status 05/31/2023 FINAL  Final   Organism ID, Bacteria ESCHERICHIA COLI  Final   Organism ID, Bacteria ESCHERICHIA COLI  Final      Susceptibility   Escherichia coli - KIRBY BAUER*    CEFAZOLIN SENSITIVE Sensitive    Escherichia coli - MIC*    AMPICILLIN <=2 SENSITIVE Sensitive     CEFEPIME <=0.12 SENSITIVE Sensitive     CEFTAZIDIME <=1 SENSITIVE Sensitive     CEFTRIAXONE <=0.25 SENSITIVE Sensitive     CIPROFLOXACIN <=0.25 SENSITIVE Sensitive     GENTAMICIN <=1 SENSITIVE Sensitive     IMIPENEM <=0.25 SENSITIVE Sensitive     TRIMETH/SULFA <=20 SENSITIVE Sensitive     AMPICILLIN/SULBACTAM <=2 SENSITIVE Sensitive     PIP/TAZO <=4 SENSITIVE Sensitive ug/mL    * ESCHERICHIA COLI     ESCHERICHIA COLI  Blood Culture ID Panel (Reflexed)     Status: Abnormal   Collection Time: 05/28/23  6:17 PM  Result Value Ref Range Status   Enterococcus faecalis NOT DETECTED NOT DETECTED Final   Enterococcus Faecium NOT DETECTED NOT DETECTED Final   Listeria monocytogenes NOT DETECTED NOT DETECTED Final   Staphylococcus species NOT DETECTED NOT DETECTED Final   Staphylococcus aureus (BCID) NOT DETECTED NOT DETECTED Final   Staphylococcus epidermidis NOT DETECTED NOT DETECTED Final   Staphylococcus lugdunensis NOT DETECTED NOT DETECTED Final   Streptococcus species NOT DETECTED NOT DETECTED Final   Streptococcus agalactiae NOT DETECTED NOT DETECTED Final   Streptococcus pneumoniae NOT DETECTED NOT DETECTED Final   Streptococcus pyogenes NOT DETECTED NOT DETECTED Final   A.calcoaceticus-baumannii NOT DETECTED NOT DETECTED Final   Bacteroides fragilis NOT DETECTED NOT DETECTED Final   Enterobacterales DETECTED (A) NOT DETECTED Final    Comment: Enterobacterales represent a large order of gram negative bacteria, not a single organism. CRITICAL RESULT CALLED TO, READ BACK BY AND VERIFIED WITH: PHARMD BAILEY A [phone] [id number] FCP    Enterobacter cloacae complex NOT DETECTED NOT DETECTED Final   Escherichia coli DETECTED (A) NOT DETECTED Final    Comment: CRITICAL RESULT CALLED TO, READ BACK BY AND VERIFIED WITH: PHARMD BAILEY A [phone] FCP    Klebsiella aerogenes NOT DETECTED NOT DETECTED Final   Klebsiella oxytoca NOT DETECTED NOT DETECTED Final   Klebsiella pneumoniae NOT DETECTED NOT DETECTED Final   Proteus species NOT DETECTED NOT DETECTED Final   Salmonella species NOT DETECTED NOT DETECTED Final   Serratia marcescens NOT DETECTED NOT DETECTED Final   Haemophilus influenzae NOT DETECTED NOT DETECTED Final   Neisseria meningitidis NOT DETECTED NOT DETECTED Final   Pseudomonas aeruginosa NOT DETECTED NOT DETECTED Final   Stenotrophomonas maltophilia NOT DETECTED NOT DETECTED Final    Candida albicans NOT DETECTED NOT DETECTED Final   Candida auris NOT DETECTED NOT DETECTED Final   Candida glabrata NOT DETECTED NOT DETECTED Final   Candida krusei NOT DETECTED NOT DETECTED Final   Candida parapsilosis NOT DETECTED NOT DETECTED Final   Candida tropicalis NOT DETECTED NOT DETECTED Final   Cryptococcus neoformans/gattii NOT DETECTED NOT DETECTED Final   CTX-M ESBL NOT DETECTED NOT DETECTED Final   Carbapenem resistance IMP NOT DETECTED NOT DETECTED Final  Carbapenem resistance KPC NOT DETECTED NOT DETECTED Final   Carbapenem resistance NDM NOT DETECTED NOT DETECTED Final   Carbapenem resist OXA 48 LIKE NOT DETECTED NOT DETECTED Final   Carbapenem resistance VIM NOT DETECTED NOT DETECTED Final    Comment: Performed at [facility], [address] [place], [place] [address]  Resp panel by RT-PCR (RSV, Flu A&B, Covid) Anterior Nasal Swab     Status: None   Collection Time: 05/28/23  6:18 PM   Specimen: Anterior Nasal Swab  Result Value Ref Range Status   SARS Coronavirus 2 by RT PCR NEGATIVE NEGATIVE Final   Influenza A by PCR NEGATIVE NEGATIVE Final   Influenza B by PCR NEGATIVE NEGATIVE Final    Comment: (NOTE) The Xpert Xpress SARS-CoV-2/FLU/RSV plus assay is intended as an aid in the diagnosis of influenza from Nasopharyngeal swab specimens and should not be used as a sole basis for treatment. Nasal washings and aspirates are unacceptable for Xpert Xpress SARS-CoV-2/FLU/RSV testing.  Fact Sheet for Patients: BloggerCourse.com  Fact Sheet for Healthcare Providers: SeriousBroker.it  This test is not yet approved or cleared by the [place] FDA and has been authorized for detection and/or diagnosis of SARS-CoV-2 by FDA under an Emergency Use Authorization (EUA). This EUA will remain in effect (meaning this test can be used) for the duration of the COVID-19 declaration under Section 564(b)(1) of the  Act, 21 U.S.C. section 360bbb-3(b)(1), unless the authorization is terminated or revoked.     Resp Syncytial Virus by PCR NEGATIVE NEGATIVE Final    Comment: (NOTE) Fact Sheet for Patients: BloggerCourse.com  Fact Sheet for Healthcare Providers: SeriousBroker.it  This test is not yet approved or cleared by the [place] FDA and has been authorized for detection and/or diagnosis of SARS-CoV-2 by FDA under an Emergency Use Authorization (EUA). This EUA will remain in effect (meaning this test can be used) for the duration of the COVID-19 declaration under Section 564(b)(1) of the Act, 21 U.S.C. section 360bbb-3(b)(1), unless the authorization is terminated or revoked.  Performed at [facility], [address] [place], [place] [address]   Blood Culture (routine x 2)     Status: Abnormal   Collection Time: 05/28/23  7:00 PM   Specimen: BLOOD  Result Value Ref Range Status   Specimen Description BLOOD SITE NOT SPECIFIED  Final   Special Requests   Final    BOTTLES DRAWN AEROBIC AND ANAEROBIC Blood Culture results may not be optimal due to an inadequate volume of blood received in culture bottles   Culture  Setup Time   Final    GRAM NEGATIVE RODS IN BOTH AEROBIC AND ANAEROBIC BOTTLES CRITICAL VALUE NOTED.  VALUE IS CONSISTENT WITH PREVIOUSLY REPORTED AND CALLED VALUE.    Culture (A)  Final    ESCHERICHIA COLI SUSCEPTIBILITIES PERFORMED ON PREVIOUS CULTURE WITHIN THE LAST 5 DAYS. Performed at [facility], [address] [place], [place] [address]    Report Status 05/31/2023 FINAL  Final  Urine Culture     Status: Abnormal   Collection Time: 05/28/23 10:00 PM   Specimen: Urine, Cystoscope  Result Value Ref Range Status   Specimen Description CYSTOSCOPY  Final   Special Requests   Final    NONE Performed at [facility], [address] [place], [place] [address]    Culture >=100,000 COLONIES/mL  ESCHERICHIA COLI (A)  Final   Report Status 05/30/2023 FINAL  Final   Organism ID, Bacteria ESCHERICHIA  COLI (A)  Final      Susceptibility   Escherichia coli - MIC*    AMPICILLIN 4 SENSITIVE Sensitive     CEFEPIME <=0.12 SENSITIVE Sensitive     CEFTAZIDIME <=1 SENSITIVE Sensitive     CEFTRIAXONE <=0.25 SENSITIVE Sensitive     CIPROFLOXACIN <=0.25 SENSITIVE Sensitive     GENTAMICIN <=1 SENSITIVE Sensitive     IMIPENEM <=0.25 SENSITIVE Sensitive     TRIMETH/SULFA <=20 SENSITIVE Sensitive     AMPICILLIN/SULBACTAM <=2 SENSITIVE Sensitive     PIP/TAZO <=4 SENSITIVE Sensitive ug/mL    * >=100,000 COLONIES/mL ESCHERICHIA COLI  MRSA Next Gen by PCR, Nasal     Status: None   Collection Time: 05/28/23 10:04 PM   Specimen: Nasal Mucosa; Nasal Swab  Result Value Ref Range Status   MRSA by PCR Next Gen NOT DETECTED NOT DETECTED Final    Comment: (NOTE) The GeneXpert MRSA Assay (FDA approved for NASAL specimens only), is one component of a comprehensive MRSA colonization surveillance program. It is not intended to diagnose MRSA infection nor to guide or monitor treatment for MRSA infections. Test performance is not FDA approved in patients less than 68 years old. Performed at [facility], [address], [place], [place] [address]   Culture, blood (Routine X 2) w Reflex to ID Panel     Status: None   Collection Time: 06/02/23  9:18 PM   Specimen: BLOOD LEFT HAND  Result Value Ref Range Status   Specimen Description BLOOD LEFT HAND  Final   Special Requests   Final    BOTTLES DRAWN AEROBIC AND ANAEROBIC Blood Culture results may not be optimal due to an inadequate volume of blood received in culture bottles   Culture   Final    NO GROWTH 5 DAYS Performed at [facility], [address], [place], [place] [address]    Report Status 06/07/2023 FINAL  Final  Culture, blood (Routine X 2) w Reflex to ID Panel     Status: None   Collection Time: 06/02/23  9:25 PM   Specimen: BLOOD  LEFT HAND  Result Value Ref Range Status   Specimen Description BLOOD LEFT HAND  Final   Special Requests   Final    BOTTLES DRAWN AEROBIC AND ANAEROBIC Blood Culture results may not be optimal due to an inadequate volume of blood received in culture bottles   Culture   Final    NO GROWTH 5 DAYS Performed at [facility], [address], [place], [place] [address]    Report Status 06/07/2023 FINAL  Final  Urine Culture (for pregnant, neutropenic or urologic patients or patients with an indwelling urinary catheter)     Status: None   Collection Time: 06/05/23  7:28 AM   Specimen: Urine, Catheterized  Result Value Ref Range Status   Specimen Description URINE, CATHETERIZED  Final   Special Requests NONE  Final   Culture   Final    NO GROWTH Performed at [facility], [address] [place], [place] [address]    Report Status 06/06/2023 FINAL  Final  Culture, blood (Routine X 2) w Reflex to ID Panel     Status: None (Preliminary result)   Collection Time: 06/05/23  7:48 AM   Specimen: BLOOD LEFT ARM  Result Value Ref Range Status   Specimen Description BLOOD LEFT ARM  Final   Special Requests   Final    BOTTLES DRAWN AEROBIC AND ANAEROBIC Blood  Culture adequate volume   Culture   Final    NO GROWTH 2 DAYS Performed at [facility], [address] [place], [place] [address]    Report Status PENDING  Incomplete  Culture, blood (Routine X 2) w Reflex to ID Panel     Status: None (Preliminary result)   Collection Time: 06/05/23  7:48 AM   Specimen: BLOOD LEFT HAND  Result Value Ref Range Status   Specimen Description BLOOD LEFT HAND  Final   Special Requests   Final    BOTTLES DRAWN AEROBIC AND ANAEROBIC Blood Culture adequate volume   Culture   Final    NO GROWTH 2 DAYS Performed at [facility], [address] [place], [place] [address]    Report Status PENDING  Incomplete     Studies: No results found.   Total care time: 55 minutes.  Joseph Art, DO [facility] 06/07/2023  If 7PM-7AM, please contact night-coverage

## 2023-06-07 NOTE — TOC Progression Note (Addendum)
Transition of Care [facility]) - Progression Note    Patient Details  Name: Deanna Shea MRN: [id number] Date of Birth: 23-Sep-1945  Transition of [facility] CM/SW Contact  Marliss Coots, LCSW Phone Number: 06/07/2023, 8:18 AM  Clinical Narrative:     8:18 AM Patient's daughter in law (disoriented x2), Deanna Shea, called CSW regarding update on SNF placement. CSW informed Deanna Shea of options located in and out of [place]. Deanna Shea expressed interest in [facility] SN[facility]. CSW called [employer] [facility] who clarified that they are in network with therapy company but not for room and board. CSW relayed this to Deanna Shea.  10:20 AM CSW emailed referral (facesheet and fl2) to [facility] SNF admissions.  1:04 PM Per [facility] SNF request, CSW emailed therapy evaluations/progress notes and H&P.  4:47 PM Patient's daughter in law, Deanna Shea, left voicemail for CSW expressed interest in [facility]. CSW attempted to call Deanna Shea to update her on SNF referrals/offers [facility] referral sent, [facility] SNF offered bed), but there was no response and a voicemail was left.  5:02 PM Deanna Shea returned CSW call. CSW informed Deanna Shea of emailed referral to [facility] and bed offer form [facility]. CSW offered to provide Medicare ratings of those and other SNFs that extended bed offer. Deanna Shea declined this offer and informed CSW that they would call CSW with SNF decision tomorrow.     Barriers to Discharge: Continued Medical Work up  Expected Discharge Plan and Services In-house Referral: Clinical Social Work     Living arrangements for the past 2 months: Assisted Living Facility                                       Social Determinants of Health (SDOH) Interventions SDOH Screenings   Food Insecurity: No Food Insecurity (05/30/2023)  Housing: Low Risk  (05/30/2023)  Transportation Needs: No Transportation Needs (05/30/2023)  Utilities: Not At Risk  (05/30/2023)  Social Connections: Patient Unable To Answer (05/30/2023)  Tobacco Use: Low Risk  (05/28/2023)    Readmission Risk Interventions     No data to display

## 2023-06-07 NOTE — Progress Notes (Signed)
   10 Days Post-Op Subjective: No acute events overnight.    Objective: Vital signs in last 24 hours: Temp:  [98 F (36.7 C)-99 F (37.2 C)] 98 F (36.7 C) (01/30 0825) Pulse Rate:  [65-72] 66 (01/30 0825) Resp:  [18-19] 18 (01/30 0433) BP: (111-126)/(48-54) 111/53 (01/30 0825) SpO2:  [94 %-98 %] 94 % (01/30 0825) Weight:  [78.7 kg] 78.7 kg (01/30 0431)  Assessment/Plan: #sepsis #emphysematous pyelitis  #left ureteral stone  To the OR with Dr. Jennette Bill on 05/28/23 for urgent ureteral stent placement.   Outpatient follow-up for definitive stone management.  URS w/ LL  Hemodynamically stable and on the floor.  CT A/P showed small areas suspicious for abscess.  None of these were large enough to be drainable and may not have liquefied at this point.  Considering her concurrent E. coli bacteremia, would engage ID for long-term outpatient ABX treatment and follow-up imaging.  Should the areas of concern in her kidneys expand in size, they may warrant drainage with IR in the future.  Urology's role in the extreme would be nephrectomy if she fails all other treatments.  Will plan to have her follow-up in clinic for definitive stone management and reassessment toward the end of her antibiotic regimen.  Around 2-3 weeks Trend labs.  Serum creatinine within normal range. Please call with questions  Intake/Output from previous day: 01/29 0701 - 01/30 0700 In: -  Out: 2400 [Urine:2400]  Intake/Output this shift: No intake/output data recorded.  Physical Exam:  General: Alert and oriented CV: No cyanosis Lungs: equal chest rise Abdomen: Soft, NTND, no rebound or guarding Gu: Foley catheter in place draining clear yellow urine with some sediment noted in dependent tubing.  Lab Results: Recent Labs    06/06/23 0510 06/07/23 0410  HGB 10.9* 10.9*  HCT 34.0* 34.2*   BMET Recent Labs    06/06/23 0510 06/07/23 0410  NA 134* 136  K 3.9 3.4*  CL 105 107  CO2 23 22  GLUCOSE  134* 114*  BUN 14 13  CREATININE 0.88 0.75  CALCIUM 7.6* 7.8*     Studies/Results: No results found.     LOS: 10 days   Elmon Kirschner, NP [facility] Pager: [phone]  06/07/2023, 12:13 PM

## 2023-06-07 NOTE — Progress Notes (Signed)
Mobility Specialist Progress Note:   06/07/23 1234  Mobility  Activity Transferred from bed to chair  Level of Assistance Minimal assist, patient does 75% or more  Assistive Device Front wheel walker  Distance Ambulated (ft) 3 ft  Activity Response Tolerated well  Mobility Referral Yes  Mobility visit 1 Mobility  Mobility Specialist Start Time (ACUTE ONLY) 1126  Mobility Specialist Stop Time (ACUTE ONLY) 1150  Mobility Specialist Time Calculation (min) (ACUTE ONLY) 24 min   Pt received in bed, alert and agreeable to mobility. MinA for all OOB activity. Pt found with large BM in bed. Assisted with pericare. Pt needing extra time to take a few steps to chair d/t leg weakness. No unsteadiness or knee buckling present. Pt denied any other discomfort, asx throughout. Pt left in chair with legs elevated, call bell in reach and all needs met.    Leory Plowman  Mobility Specialist Please contact via [employer] office at [phone]

## 2023-06-07 NOTE — Progress Notes (Signed)
Speech Language Pathology Treatment: Cognitive-Linquistic  Patient Details Name: Deanna Shea MRN: [id number] DOB: Aug 01, 1945 Today's Date: 06/07/2023 Time: 4098-1191 SLP Time Calculation (min) (ACUTE ONLY): 20 min  Assessment / Plan / Recommendation Clinical Impression  Pt seen at bedside for diagnostic treatment for cognition. Pt alert and cooperative today. The Mini-Mental State Examination (MMSE) was administered today. Pt scored 23/30 (n=25+/30), indicating mild cognitive deficits. Pt exhibited difficulty with orientation to day of week, calculation (unable to complete serial 7's), delayed recall (2/3 remembered). Clock drawing task was also administered, which pt completed accurately.   Pt reports improvement in mentation, however, she does not feel that she is back to her baseline level of function. She indicates her processing speed is slower than normal, but she is making gradual progress. Recommend continued ST intervention at next venue of care to maximize independence and reduce caregiver burden.    HPI HPI: 78 year old woman who presented to [facility] ED 05/28/23 for AMS and lethargy.  Found to have ?DKA, urosepsis.ObstructedL UVJ calculus with obstructive uropathy. Uro consulted. Taken to OR  05/28/23 for L ureteral stent. PMHx significant for Afib (on ASA/digoxin/propranolol), mitral valve prolapse, HLD, T2DM, nephrolithiasis, hypothyroidism, anxiety/depression, schizophrenia.      SLP Plan  Other (Comment) (Recommend follow up with speech therapy at next venue of care.)      Recommendations for follow up therapy are one component of a multi-disciplinary discharge planning process, led by the attending physician.  Recommendations may be updated based on patient status, additional functional criteria and insurance authorization.                     Oral care BID   Frequent or constant Supervision/Assistance Cognitive communication deficit (R41.841);Attention and concentration  deficit     Other (Comment) (Recommend follow up with speech therapy at next venue of care.)    Deanna Shea B. Murvin Natal, [facility], CCC-SLP Speech Language Pathologist Office: [phone]  Deanna Shea 06/07/2023, 1:48 PM

## 2023-06-07 NOTE — Consult Note (Addendum)
[facility]                                                                                       Patient Identification: Patient Name: Deanna Shea MRN: [id number] Admit Date: 05/28/2023  6:11 PM Today's Date: 06/07/2023 Reason for consult: bacteremia/renal abscess Requesting provider: Dr Benjamine Mola  Principal Problem:   Sepsis due to urinary tract infection [facility]) Active Problems:   AKI (acute kidney injury) (HCC)   Acute encephalopathy   Diabetic ketoacidosis without coma associated with type 2 diabetes mellitus (HCC)   Ureterolithiasis   Septic shock (HCC)   Antibiotics:  Vancomycin 1/28- Cefepime 1/28-c Total days of antibiotics 11  Lines/Hardware: Left urteral stent  Assessment # Septic Shock resolved  # Obstructive Uropathy  # E coli bacteremia # Pyelonephritis  # Renal abscesses  -Underwent cystoscopy, left retrograde pyelogram and left ureteral stent placement by urology on 1/20, urine cx with Pan S E coli.  -1/20 blood cx 2/2 sets pan S E coli. Repeat blood cx 1/25 and 1/28 NGTD. - 1/28 Urine cx NG -Per Urology, abscess too small to intervene by IR  Recommendations  -Will switch IV cefepime to IV cefazolin.  If continues to be afebrile by tomorrow can switch to cefadroxil, 3 weeks course, EOT 2/20  -Monitor CBC and BMP on antibiotics -Patient has a follow-up with myself at [facility] at 1 pm, plan to repeat imaging for determining further duration of antibiotics -Follow-up with urology as instructed -ID will sign off for now please call with questions or concerns  Rest of the management as per the primary team. Please call with questions or concerns.  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  Workup in the ED remarkable for hyperglycemia, hyponatremia, hypokalemia elevated creatinine, elevated beta-hydroxybutyrate, elevated lactic acid and mildly elevated troponin.  Started on broad-spectrum antibiotics for septic shock 2/2 urinary source. Initially admitted in the ICU requiring vasopressors. CT 1/20 Obstructing 7 mm left UVJ calculus, with moderate left-sided obstructive uropathy as well as left renal edema and perinephric fat stranding. Gas within the bladder, left ureter, and left renal collecting system could reflect infection with gas-forming organism or sequela of recent intervention.  Underwent cystoscopy, left retrograde pyelogram and left ureteral stent placement by urology on 1/20, urine cx with Pan S E coli. Extubated shortly post procedure. 1/2 blood cx 2/2 sets pan S E coli. Repeat blood cx 1/25 and 1/28 NGTD. Urine cx NG  Hospital course complicated by fever after which IV cefazolin was broadened to vancomycin and cefepime on 1/28. Repeat CT abdomen/pelvis  Enlarged left kidney with striated nephrogram appearance and perinephric fat stranding. Findings are compatible with pyelonephritis.Patchy areas of hypodensity throughout the left kidney worrisome for renal abscesses.  ROS: General- Denies fever, chills, loss of appetite and loss of weight HEENT - Denies headache, blurry vision, neck pain, sinus pain Chest - Denies any chest pain, SOB or cough CVS- Denies any dizziness/lightheadedness,  syncopal attacks, palpitations Abdomen- Denies any nausea, vomiting, abdominal pain, hematochezia and diarrhea Neuro - Denies any weakness, numbness, tingling sensation Psych - Denies any changes in mood irritability or depressive symptoms GU- Denies any burning, dysuria, hematuria or increased frequency of urination Skin - denies any  rashes/lesions MSK - denies any joint pain/swelling or restricted ROM   Past Medical History:  Diagnosis Date   Allergic rhinitis    Anxiety    Atrial fibrillation (HCC) 10/28/2013   diagnoses in the 20's, well controlled with Digoxin and Inderal    Depression    Diabetes mellitus without complication (HCC)    Gall stones    Heart murmur    Kidney disease, medullary sponge    Kidney stones    Mitral valve disorders(424.0) 10/28/2013   Mitral valve prolapse    congenital    MRSA (methicillin resistant Staphylococcus aureus)    9/10   Osteopenia    dexa 4/09, improved but not resolved 2011 DEXA   Palpitations 10/28/2013   Schizophrenia (HCC)    Shingles    2000   Past Surgical History:  Procedure Laterality Date   APPENDECTOMY  01/29/2014   CYSTOSCOPY W/ STONE MANIPULATION  1990's   CYSTOSCOPY W/ URETERAL STENT PLACEMENT Left 05/28/2023   Procedure: CYSTOSCOPY WITH RETROGRADE PYELOGRAM/URETERAL STENT PLACEMENT;  Surgeon: Adonis Brook, MD;  Location: [facility] OR;  Service: Urology;  Laterality: Left;   ELBOW ARTHROSCOPY Left 2007   ELBOW ARTHROSCOPY Right 2008   "cyst removed; tricep"   ELBOW ARTHROSCOPY WITH TENDON RECONSTRUCTION Right 2005; 2006   EXPLORATORY LAPAROTOMY  01/29/2014   KNEE ARTHROSCOPY Right 2004   "bone chip removed"   KNEE ARTHROSCOPY Right 02/2009   "cleanup loose cartilage"   LAPAROTOMY N/A 01/29/2014   Procedure: EXPLORATORY LAPAROTOMY, SMALL BOWEL RESECTION, APPENDECTOMY;  Surgeon: Manus Rudd, MD;  Location: [facility] OR;  Service: General;  Laterality: N/A;   NASAL SINUS SURGERY  X 2   "for polyps"   SHOULDER ARTHROSCOPY Right 1990's X 2   SHOULDER ARTHROSCOPY W/ ROTATOR CUFF REPAIR Right    SMALL INTESTINE SURGERY  01/29/2014   Scheduled Meds:  aspirin EC  81 mg Oral Daily   Chlorhexidine Gluconate Cloth  6 each Topical Daily   escitalopram  20 mg Oral Daily   feeding supplement  237 mL Oral BID BM   heparin injection (subcutaneous)  5,000 Units  Subcutaneous Q8H   insulin aspart  0-15 Units Subcutaneous TID WC   insulin aspart  0-5 Units Subcutaneous QHS   insulin glargine-yfgn  13 Units Subcutaneous BID   levothyroxine  88 mcg Oral Q0600   multivitamin with minerals  1 tablet Oral Daily   propranolol  20 mg Oral QID   risperiDONE  3 mg Oral QHS   rosuvastatin  2.5 mg Oral Daily   Continuous Infusions:  ceFEPime (MAXIPIME) IV 2 g (06/07/23 0933)   PRN Meds:.acetaminophen, dextrose, docusate sodium, fentaNYL (SUBLIMAZE) injection, ondansetron (ZOFRAN) IV, mouth rinse, polyethylene glycol  Allergies  Allergen Reactions   Sulfa Antibiotics Other (See Comments)    Sores in mouth   Social History   Socioeconomic History   Marital status: Widowed    Spouse name: Not on file   Number of children: Not on file   Years of education: Not on file   Highest education level: Not on file  Occupational History   Not on file  Tobacco Use   Smoking status: Never   Smokeless tobacco: Never  Substance and Sexual  Activity   Alcohol use: No   Drug use: No   Sexual activity: Not on file  Other Topics Concern   Not on file  Social History Narrative   Not on file   Social Drivers of Health   Financial Resource Strain: Not on file  Food Insecurity: No Food Insecurity (05/30/2023)   Hunger Vital Sign    Worried About Running Out of Food in the Last Year: Never true    Ran Out of Food in the Last Year: Never true  Transportation Needs: No Transportation Needs (05/30/2023)   PRAPARE - Administrator, Civil Service (Medical): No    Lack of Transportation (Non-Medical): No  Physical Activity: Not on file  Stress: Not on file  Social Connections: Patient Unable To Answer (05/30/2023)   Social Connection and Isolation Panel [NHANES]    Frequency of Communication with Friends and Family: Patient unable to answer    Frequency of Social Gatherings with Friends and Family: Patient unable to answer    Attends Religious Services:  Patient unable to answer    Active Member of Clubs or Organizations: Patient unable to answer    Attends Banker Meetings: Patient unable to answer    Marital Status: Patient unable to answer  Intimate Partner Violence: Not At Risk (05/30/2023)   Humiliation, Afraid, Rape, and Kick questionnaire    Fear of Current or Ex-Partner: No    Emotionally Abused: No    Physically Abused: No    Sexually Abused: No   Family History  Problem Relation Age of Onset   Hypertension Mother    Diabetes Mellitus II Mother    Arthritis Father    Cataracts Father    Dementia Father    Heart attack Neg Hx    Vitals BP (!) 111/53   Pulse 66   Temp 98 F (36.7 C)   Resp 18   Ht 5\' 7"  (1.702 m)   Wt 78.7 kg   SpO2 94%   BMI 27.17 kg/m    Physical Exam Constitutional: Adult transgender female sitting in the bed, not in acute distress    Comments: HEENT WNL  Cardiovascular:     Rate and Rhythm: Normal rate and Irregular rhythm.     Heart sounds: S1 and S2  Pulmonary:     Effort: Pulmonary effort is normal.     Comments: Normal breath sounds  Abdominal:     Palpations: Abdomen is soft.     Tenderness: Nondistended nontender  Musculoskeletal:        General: No swelling or tenderness in peripheral joints  Skin:    Comments: No rashes  Neurological:     General: Awake, alert and oriented, following commands grossly nonfocal  Psychiatric:        Mood and Affect: Mood normal.    Pertinent Microbiology Results for orders placed or performed during the hospital encounter of 05/28/23  Blood Culture (routine x 2)     Status: Abnormal   Collection Time: 05/28/23  6:17 PM   Specimen: BLOOD  Result Value Ref Range Status   Specimen Description BLOOD SITE NOT SPECIFIED  Final   Special Requests   Final    BOTTLES DRAWN AEROBIC AND ANAEROBIC Blood Culture results may not be optimal due to an inadequate volume of blood received in culture bottles   Culture  Setup Time   Final     GRAM NEGATIVE RODS IN BOTH AEROBIC AND ANAEROBIC BOTTLES CRITICAL RESULT CALLED TO,  READ BACK BY AND VERIFIED WITH: Gwendolyn Fill A [id number] FCP Performed at [facility], [address], [place], [place] [address]    Culture ESCHERICHIA COLI (A)  Final   Report Status 05/31/2023 FINAL  Final   Organism ID, Bacteria ESCHERICHIA COLI  Final   Organism ID, Bacteria ESCHERICHIA COLI  Final      Susceptibility   Escherichia coli - KIRBY BAUER*    CEFAZOLIN SENSITIVE Sensitive    Escherichia coli - MIC*    AMPICILLIN <=2 SENSITIVE Sensitive     CEFEPIME <=0.12 SENSITIVE Sensitive     CEFTAZIDIME <=1 SENSITIVE Sensitive     CEFTRIAXONE <=0.25 SENSITIVE Sensitive     CIPROFLOXACIN <=0.25 SENSITIVE Sensitive     GENTAMICIN <=1 SENSITIVE Sensitive     IMIPENEM <=0.25 SENSITIVE Sensitive     TRIMETH/SULFA <=20 SENSITIVE Sensitive     AMPICILLIN/SULBACTAM <=2 SENSITIVE Sensitive     PIP/TAZO <=4 SENSITIVE Sensitive ug/mL    * ESCHERICHIA COLI    ESCHERICHIA COLI  Blood Culture ID Panel (Reflexed)     Status: Abnormal   Collection Time: 05/28/23  6:17 PM  Result Value Ref Range Status   Enterococcus faecalis NOT DETECTED NOT DETECTED Final   Enterococcus Faecium NOT DETECTED NOT DETECTED Final   Listeria monocytogenes NOT DETECTED NOT DETECTED Final   Staphylococcus species NOT DETECTED NOT DETECTED Final   Staphylococcus aureus (BCID) NOT DETECTED NOT DETECTED Final   Staphylococcus epidermidis NOT DETECTED NOT DETECTED Final   Staphylococcus lugdunensis NOT DETECTED NOT DETECTED Final   Streptococcus species NOT DETECTED NOT DETECTED Final   Streptococcus agalactiae NOT DETECTED NOT DETECTED Final   Streptococcus pneumoniae NOT DETECTED NOT DETECTED Final   Streptococcus pyogenes NOT DETECTED NOT DETECTED Final   A.calcoaceticus-baumannii NOT DETECTED NOT DETECTED Final   Bacteroides fragilis NOT DETECTED NOT DETECTED Final   Enterobacterales DETECTED (A) NOT DETECTED Final     Comment: Enterobacterales represent a large order of gram negative bacteria, not a single organism. CRITICAL RESULT CALLED TO, READ BACK BY AND VERIFIED WITH: PHARMD BAILEY A [phone] [id number] FCP    Enterobacter cloacae complex NOT DETECTED NOT DETECTED Final   Escherichia coli DETECTED (A) NOT DETECTED Final    Comment: CRITICAL RESULT CALLED TO, READ BACK BY AND VERIFIED WITH: PHARMD BAILEY A [phone] FCP    Klebsiella aerogenes NOT DETECTED NOT DETECTED Final   Klebsiella oxytoca NOT DETECTED NOT DETECTED Final   Klebsiella pneumoniae NOT DETECTED NOT DETECTED Final   Proteus species NOT DETECTED NOT DETECTED Final   Salmonella species NOT DETECTED NOT DETECTED Final   Serratia marcescens NOT DETECTED NOT DETECTED Final   Haemophilus influenzae NOT DETECTED NOT DETECTED Final   Neisseria meningitidis NOT DETECTED NOT DETECTED Final   Pseudomonas aeruginosa NOT DETECTED NOT DETECTED Final   Stenotrophomonas maltophilia NOT DETECTED NOT DETECTED Final   Candida albicans NOT DETECTED NOT DETECTED Final   Candida auris NOT DETECTED NOT DETECTED Final   Candida glabrata NOT DETECTED NOT DETECTED Final   Candida krusei NOT DETECTED NOT DETECTED Final   Candida parapsilosis NOT DETECTED NOT DETECTED Final   Candida tropicalis NOT DETECTED NOT DETECTED Final   Cryptococcus neoformans/gattii NOT DETECTED NOT DETECTED Final   CTX-M ESBL NOT DETECTED NOT DETECTED Final   Carbapenem resistance IMP NOT DETECTED NOT DETECTED Final   Carbapenem resistance KPC NOT DETECTED NOT DETECTED Final   Carbapenem resistance NDM NOT DETECTED NOT DETECTED Final   Carbapenem resist OXA 48 LIKE NOT  DETECTED NOT DETECTED Final   Carbapenem resistance VIM NOT DETECTED NOT DETECTED Final    Comment: Performed at [facility], [address], [place], [place] [address]  Resp panel by RT-PCR (RSV, Flu A&B, Covid) Anterior Nasal Swab     Status: None   Collection Time: 05/28/23  6:18 PM   Specimen: Anterior  Nasal Swab  Result Value Ref Range Status   SARS Coronavirus 2 by RT PCR NEGATIVE NEGATIVE Final   Influenza A by PCR NEGATIVE NEGATIVE Final   Influenza B by PCR NEGATIVE NEGATIVE Final    Comment: (NOTE) The Xpert Xpress SARS-CoV-2/FLU/RSV plus assay is intended as an aid in the diagnosis of influenza from Nasopharyngeal swab specimens and should not be used as a sole basis for treatment. Nasal washings and aspirates are unacceptable for Xpert Xpress SARS-CoV-2/FLU/RSV testing.  Fact Sheet for Patients: BloggerCourse.com  Fact Sheet for Healthcare Providers: SeriousBroker.it  This test is not yet approved or cleared by the [place] FDA and has been authorized for detection and/or diagnosis of SARS-CoV-2 by FDA under an Emergency Use Authorization (EUA). This EUA will remain in effect (meaning this test can be used) for the duration of the COVID-19 declaration under Section 564(b)(1) of the Act, 21 U.S.C. section 360bbb-3(b)(1), unless the authorization is terminated or revoked.     Resp Syncytial Virus by PCR NEGATIVE NEGATIVE Final    Comment: (NOTE) Fact Sheet for Patients: BloggerCourse.com  Fact Sheet for Healthcare Providers: SeriousBroker.it  This test is not yet approved or cleared by the [place] FDA and has been authorized for detection and/or diagnosis of SARS-CoV-2 by FDA under an Emergency Use Authorization (EUA). This EUA will remain in effect (meaning this test can be used) for the duration of the COVID-19 declaration under Section 564(b)(1) of the Act, 21 U.S.C. section 360bbb-3(b)(1), unless the authorization is terminated or revoked.  Performed at [facility], [address] [place], [place] [address]   Blood Culture (routine x 2)     Status: Abnormal   Collection Time: 05/28/23  7:00 PM   Specimen: BLOOD  Result Value Ref Range Status    Specimen Description BLOOD SITE NOT SPECIFIED  Final   Special Requests   Final    BOTTLES DRAWN AEROBIC AND ANAEROBIC Blood Culture results may not be optimal due to an inadequate volume of blood received in culture bottles   Culture  Setup Time   Final    GRAM NEGATIVE RODS IN BOTH AEROBIC AND ANAEROBIC BOTTLES CRITICAL VALUE NOTED.  VALUE IS CONSISTENT WITH PREVIOUSLY REPORTED AND CALLED VALUE.    Culture (A)  Final    ESCHERICHIA COLI SUSCEPTIBILITIES PERFORMED ON PREVIOUS CULTURE WITHIN THE LAST 5 DAYS. Performed at [facility], [address], [place], [place] [address]    Report Status 05/31/2023 FINAL  Final  Urine Culture     Status: Abnormal   Collection Time: 05/28/23 10:00 PM   Specimen: Urine, Cystoscope  Result Value Ref Range Status   Specimen Description CYSTOSCOPY  Final   Special Requests   Final    NONE Performed at [facility], [address] [place], [place] [address]    Culture >=100,000 COLONIES/mL ESCHERICHIA COLI (A)  Final   Report Status 05/30/2023 FINAL  Final   Organism ID, Bacteria ESCHERICHIA COLI (A)  Final      Susceptibility   Escherichia coli - MIC*    AMPICILLIN 4 SENSITIVE Sensitive  CEFEPIME <=0.12 SENSITIVE Sensitive     CEFTAZIDIME <=1 SENSITIVE Sensitive     CEFTRIAXONE <=0.25 SENSITIVE Sensitive     CIPROFLOXACIN <=0.25 SENSITIVE Sensitive     GENTAMICIN <=1 SENSITIVE Sensitive     IMIPENEM <=0.25 SENSITIVE Sensitive     TRIMETH/SULFA <=20 SENSITIVE Sensitive     AMPICILLIN/SULBACTAM <=2 SENSITIVE Sensitive     PIP/TAZO <=4 SENSITIVE Sensitive ug/mL    * >=100,000 COLONIES/mL ESCHERICHIA COLI  MRSA Next Gen by PCR, Nasal     Status: None   Collection Time: 05/28/23 10:04 PM   Specimen: Nasal Mucosa; Nasal Swab  Result Value Ref Range Status   MRSA by PCR Next Gen NOT DETECTED NOT DETECTED Final    Comment: (NOTE) The GeneXpert MRSA Assay (FDA approved for NASAL specimens only), is one component of a  comprehensive MRSA colonization surveillance program. It is not intended to diagnose MRSA infection nor to guide or monitor treatment for MRSA infections. Test performance is not FDA approved in patients less than 55 years old. Performed at [facility], [address] [place], [place] [address]   Culture, blood (Routine X 2) w Reflex to ID Panel     Status: None   Collection Time: 06/02/23  9:18 PM   Specimen: BLOOD LEFT HAND  Result Value Ref Range Status   Specimen Description BLOOD LEFT HAND  Final   Special Requests   Final    BOTTLES DRAWN AEROBIC AND ANAEROBIC Blood Culture results may not be optimal due to an inadequate volume of blood received in culture bottles   Culture   Final    NO GROWTH 5 DAYS Performed at [facility], [address] [place], [place] [address]    Report Status 06/07/2023 FINAL  Final  Culture, blood (Routine X 2) w Reflex to ID Panel     Status: None   Collection Time: 06/02/23  9:25 PM   Specimen: BLOOD LEFT HAND  Result Value Ref Range Status   Specimen Description BLOOD LEFT HAND  Final   Special Requests   Final    BOTTLES DRAWN AEROBIC AND ANAEROBIC Blood Culture results may not be optimal due to an inadequate volume of blood received in culture bottles   Culture   Final    NO GROWTH 5 DAYS Performed at [facility], [address], [place], [place] [address]    Report Status 06/07/2023 FINAL  Final  Urine Culture (for pregnant, neutropenic or urologic patients or patients with an indwelling urinary catheter)     Status: None   Collection Time: 06/05/23  7:28 AM   Specimen: Urine, Catheterized  Result Value Ref Range Status   Specimen Description URINE, CATHETERIZED  Final   Special Requests NONE  Final   Culture   Final    NO GROWTH Performed at [facility], [address], [place], [place] [address]    Report Status 06/06/2023 FINAL  Final  Culture, blood (Routine X 2) w Reflex to ID Panel     Status: None  (Preliminary result)   Collection Time: 06/05/23  7:48 AM   Specimen: BLOOD LEFT ARM  Result Value Ref Range Status   Specimen Description BLOOD LEFT ARM  Final   Special Requests   Final    BOTTLES DRAWN AEROBIC AND ANAEROBIC Blood Culture adequate volume   Culture   Final    NO GROWTH 2 DAYS Performed at [facility], [address],  [place], [place] [address]    Report Status PENDING  Incomplete  Culture, blood (Routine X 2) w Reflex to ID Panel     Status: None (Preliminary result)   Collection Time: 06/05/23  7:48 AM   Specimen: BLOOD LEFT HAND  Result Value Ref Range Status   Specimen Description BLOOD LEFT HAND  Final   Special Requests   Final    BOTTLES DRAWN AEROBIC AND ANAEROBIC Blood Culture adequate volume   Culture   Final    NO GROWTH 2 DAYS Performed at [facility], [address] [place], [place] [address]    Report Status PENDING  Incomplete   Pertinent Lab seen by me:    Latest Ref Rng & Units 06/07/2023    4:10 AM 06/06/2023    5:10 AM 06/04/2023    4:16 AM  CBC  WBC 4.0 - 10.5 K/uL 7.6  8.3  8.8   Hemoglobin 12.0 - 15.0 g/dL 82.9  56.2  13.0   Hematocrit 36.0 - 46.0 % 34.2  34.0  38.1   Platelets 150 - 400 K/uL 435  372  120       Latest Ref Rng & Units 06/07/2023    4:10 AM 06/06/2023    5:10 AM 06/04/2023    4:16 AM  CMP  Glucose 70 - 99 mg/dL 865  784  696   BUN 8 - 23 mg/dL 13  14  23    Creatinine 0.44 - 1.00 mg/dL 2.95  2.84  1.32   Sodium 135 - 145 mmol/L 136  134  133   Potassium 3.5 - 5.1 mmol/L 3.4  3.9  4.6   Chloride 98 - 111 mmol/L 107  105  103   CO2 22 - 32 mmol/L 22  23  20    Calcium 8.9 - 10.3 mg/dL 7.8  7.6  7.5    Pertinent Imagings/Other Imagings Plain films and CT images have been personally visualized and interpreted; radiology reports have been reviewed. Decision making incorporated into the Impression / Recommendations.  CT ABDOMEN PELVIS W CONTRAST Result Date: 06/05/2023 CLINICAL DATA:  Abdominal and flank  pain, hematuria. EXAM: CT ABDOMEN AND PELVIS WITH CONTRAST TECHNIQUE: Multidetector CT imaging of the abdomen and pelvis was performed using the standard protocol following bolus administration of intravenous contrast. RADIATION DOSE REDUCTION: This exam was performed according to the departmental dose-optimization program which includes automated exposure control, adjustment of the mA and/or kV according to patient size and/or use of iterative reconstruction technique. CONTRAST:  75mL OMNIPAQUE IOHEXOL 350 MG/ML SOLN COMPARISON:  CT chest abdomen and pelvis 05/28/2023 FINDINGS: Lower chest: There are new small bilateral pleural effusions with bibasilar atelectasis. Hepatobiliary: Gallstones are present. There is no biliary ductal dilatation. The liver is within normal limits. Pancreas: Unremarkable. No pancreatic ductal dilatation or surrounding inflammatory changes. Spleen: Normal in size without focal abnormality. Adrenals/Urinary Tract: A new left ureteral stent is in place. There is no hydronephrosis. The left kidney is enlarged with mild perinephric fat stranding. Striated nephrogram appearance of the left kidney. There are patchy areas of hypodensity throughout the left kidney, many of which are seen peripherally. Dominant area is seen posteriorly on image 3/37 measuring 2.4 x 1.9 by 2.2 cm. Second largest areas seen anteriorly measuring 2.1 by 2.0 by 1.0 cm image 3/40. Left renal calculi are again seen measuring up to 10 mm. No definite ureteral or bladder calculi are identified. The right kidney and adrenal glands are within normal limits. Stomach/Bowel: There is  a small hiatal hernia. Stomach is within normal limits. Appendix is not seen. Small bowel anastomosis is present in the right lower quadrant. No evidence of bowel wall thickening, distention, or inflammatory changes. Vascular/Lymphatic: No significant vascular findings are present. No enlarged abdominal or pelvic lymph nodes. Reproductive: Uterus  and bilateral adnexa are unremarkable. Other: Presacral edema is new from prior. There is new body wall edema. There is no ascites. There some bulging of the anterior abdominal wall, unchanged. Musculoskeletal: Degenerative changes affect the spine. IMPRESSION: 1. New left ureteral stent in place. No hydronephrosis. 2. Enlarged left kidney with striated nephrogram appearance and perinephric fat stranding. Findings are compatible with pyelonephritis. 3. Patchy areas of hypodensity throughout the left kidney worrisome for renal abscesses. 4. Left nonobstructing renal calculi. 5. New small bilateral pleural effusions with bibasilar atelectasis. 6. New body wall edema and presacral edema. 7. Cholelithiasis. 8. Bosniak I benign renal cyst measuring 2.4 cm. No follow-up imaging is recommended. JACR 2018 Feb; [phone], Management of the Incidental Renal Mass on CT, RadioGraphics 2021; [id number], Bosniak Classification of Cystic Renal Masses, Version 2019. Electronically Signed   By: Darliss Cheney M.D.   On: 06/05/2023 01:18   DG Chest Port 1 View Result Date: 06/02/2023 CLINICAL DATA:  [id number] Fever [id number] EXAM: PORTABLE CHEST 1 VIEW COMPARISON:  Chest x-ray 05/28/2023 FINDINGS: The heart and mediastinal contours are grossly unchanged given AP portable technique. Low lung volumes. No focal consolidation. Mild pulmonary edema. Likely bilateral trace pleural effusions. No pneumothorax. No acute osseous abnormality. IMPRESSION: Low lung volumes with mild pulmonary edema and likely bilateral trace pleural effusions. Electronically Signed   By: Tish Frederickson M.D.   On: 06/02/2023 21:43   Portable Chest x-ray Result Date: 05/28/2023 CLINICAL DATA:  ET tube EXAM: PORTABLE CHEST 1 VIEW COMPARISON:  05/28/2023 FINDINGS: Endotracheal tube is 3.5 cm above the carina. NG tube is in the stomach. Right central line tip at the cavoatrial junction. Heart mediastinal contours within normal limits. Bibasilar atelectasis. No effusions or  pneumothorax. No acute bony abnormality. IMPRESSION: Support devices in expected position as above. Bibasilar atelectasis. Electronically Signed   By: Charlett Nose M.D.   On: 05/28/2023 23:36   DG C-Arm 1-60 Min Result Date: 05/28/2023 CLINICAL DATA:  Cystoscopy and retrograde pyelography, intraoperative examination EXAM: DG C-ARM 1-60 MIN CONTRAST:  Not listed, see operative report FLUOROSCOPY: Fluoroscopy Time:  24.7 seconds Radiation Exposure Index (if provided by the fluoroscopic device): 5.45 mGy Number of Acquired Spot Images: 1 COMPARISON:  None Available. FINDINGS: Single fluoroscopic intraoperative radiograph demonstrates the superior aspect of the double-J ureteral stent extending into the expected upper pole calyx from the left kidney. No hydronephrosis. IMPRESSION: 1. Fluoroscopic guidance for left double-J ureteral stent placement. Electronically Signed   By: Helyn Numbers M.D.   On: 05/28/2023 22:30   CT CHEST ABDOMEN PELVIS WO CONTRAST Result Date: 05/28/2023 CLINICAL DATA:  Sepsis EXAM: CT CHEST, ABDOMEN AND PELVIS WITHOUT CONTRAST TECHNIQUE: Multidetector CT imaging of the chest, abdomen and pelvis was performed following the standard protocol without IV contrast. RADIATION DOSE REDUCTION: This exam was performed according to the departmental dose-optimization program which includes automated exposure control, adjustment of the mA and/or kV according to patient size and/or use of iterative reconstruction technique. COMPARISON:  01/05/2023 FINDINGS: CT CHEST FINDINGS Cardiovascular: Unenhanced imaging of the heart is unremarkable without pericardial effusion. Normal caliber of the thoracic aorta. Evaluation of the vascular lumen is limited without IV contrast. Mediastinum/Nodes: No enlarged mediastinal, hilar, or axillary lymph  nodes. Thyroid gland, trachea, and esophagus demonstrate no significant findings. Lungs/Pleura: Dependent hypoventilatory changes within the lower lobes. No acute  airspace disease, effusion, or pneumothorax. The central airways are patent. Musculoskeletal: No acute or destructive bony abnormalities. Reconstructed images demonstrate no additional findings. CT ABDOMEN PELVIS FINDINGS Hepatobiliary: Diffuse hepatic steatosis. Calcified gallstone and gallbladder sludge without evidence of acute cholecystitis. No biliary duct dilation. Pancreas: Unremarkable unenhanced appearance. Spleen: Unremarkable unenhanced appearance. Adrenals/Urinary Tract: There is an obstructing 7 mm left UVJ calculus, reference image 115/3. There is an additional nonobstructing 9 mm calculus within the distal left ureter reference image 103/3. Multiple nonobstructing left renal calculi are identified measuring up to 9 mm. There is moderate left-sided hydronephrosis and hydroureter. Left kidney is enlarged with diffuse renal edema and prominent perinephric fat stranding. Gas lucencies are identified within the upper pole calices, proximal left ureter, and bladder. If there has been no recent intervention, infection with gas-forming organism is suspected. Please correlate with urinalysis. Nonobstructing faint calcifications are seen within the mid and lower pole calices of the right kidney. No right-sided obstruction. The adrenals are unremarkable. Stomach/Bowel: No bowel obstruction or ileus. The appendix is surgically absent. Prior small bowel resection and reanastomosis. No bowel wall thickening or inflammatory change. Vascular/Lymphatic: Aortic atherosclerosis. No enlarged abdominal or pelvic lymph nodes. Reproductive: Uterus and bilateral adnexa are unremarkable. Other: Trace pelvic free fluid. No free intraperitoneal gas. No abdominal wall hernia. Musculoskeletal: No acute or destructive bony abnormalities. Reconstructed images demonstrate no additional findings. IMPRESSION: 1. Obstructing 7 mm left UVJ calculus, with moderate left-sided obstructive uropathy as well as left renal edema and  perinephric fat stranding. Gas within the bladder, left ureter, and left renal collecting system could reflect infection with gas-forming organism or sequela of recent intervention. Please correlate with procedural history and urinalysis. 2. Other nonobstructing calculi within the right kidney, left kidney, and left ureter as above. 3. Cholelithiasis and gallbladder sludge. No evidence of acute cholecystitis. 4. Hepatic steatosis. 5. Trace pelvic free fluid, likely reactive. 6.  Aortic Atherosclerosis (ICD10-I70.0). Electronically Signed   By: Sharlet Salina M.D.   On: 05/28/2023 19:58   CT Head Wo Contrast Result Date: 05/28/2023 CLINICAL DATA:  Altered level of consciousness, sepsis EXAM: CT HEAD WITHOUT CONTRAST TECHNIQUE: Contiguous axial images were obtained from the base of the skull through the vertex without intravenous contrast. RADIATION DOSE REDUCTION: This exam was performed according to the departmental dose-optimization program which includes automated exposure control, adjustment of the mA and/or kV according to patient size and/or use of iterative reconstruction technique. COMPARISON:  01/02/2023 FINDINGS: Brain: Imaging is limited due to patient motion throughout the study. No evidence of acute infarct or hemorrhage. Lateral ventricles and midline structures are unremarkable. No acute extra-axial fluid collections. No mass effect. Vascular: No hyperdense vessel or unexpected calcification. Skull: Normal. Negative for fracture or focal lesion. Sinuses/Orbits: Significant mucosal thickening within the left maxillary sinus unchanged. Stable opacification of the right sphenoid sinus. Other: None. IMPRESSION: 1. Limited study due to patient motion. 2. No acute intracranial process. 3. 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 Electronically Signed   By: Sharlet Salina M.D.   On: 05/28/2023 19:51   DG Chest Port 1 View Result Date: 05/28/2023 CLINICAL DATA:  Questionable  sepsis. EXAM: PORTABLE CHEST 1 VIEW COMPARISON:  01/28/2014 FINDINGS: Heart and mediastinal contours are within normal limits. No focal opacities or effusions. No acute bony abnormality. IMPRESSION: No active disease. Electronically Signed   By: Charlett Nose  M.D.   On: 05/28/2023 18:43   I have personally spent 81 minutes involved in face-to-face and non-face-to-face activities for this patient on the day of the visit. Professional time spent includes the following activities: Preparing to see the patient (review of tests), Obtaining and/or reviewing separately obtained history (admission/discharge record), Performing a medically appropriate examination and/or evaluation , Ordering medications/tests/procedures, referring and communicating with other health care professionals, Documenting clinical information in the EMR, Independently interpreting results (not separately reported), Communicating results to the patient/family/caregiver, Counseling and educating the patient/family/caregiver and Care coordination (not separately reported).  Electronically signed by:   Plan d/w requesting provider as well as ID pharm D  Of note, portions of this note may have been created with voice recognition software. While this note has been edited for accuracy, occasional wrong-word or 'sound-a-like' substitutions may have occurred due to the inherent limitations of voice recognition software.   Odette Fraction, MD Infectious Disease Physician [facility] for Infectious Disease Pager: [phone]

## 2023-06-08 DIAGNOSIS — A419 Sepsis, unspecified organism: Secondary | ICD-10-CM | POA: Diagnosis not present

## 2023-06-08 DIAGNOSIS — N390 Urinary tract infection, site not specified: Secondary | ICD-10-CM | POA: Diagnosis not present

## 2023-06-08 LAB — BASIC METABOLIC PANEL
Anion gap: 9 (ref 5–15)
BUN: 11 mg/dL (ref 8–23)
CO2: 22 mmol/L (ref 22–32)
Calcium: 8.1 mg/dL — ABNORMAL LOW (ref 8.9–10.3)
Chloride: 106 mmol/L (ref 98–111)
Creatinine, Ser: 0.73 mg/dL (ref 0.44–1.00)
GFR, Estimated: >60 mL/min (ref >60)
Glucose, Bld: 121 mg/dL — ABNORMAL HIGH (ref 70–99)
Potassium: 4 mmol/L (ref 3.5–5.1)
Sodium: 137 mmol/L (ref 135–145)

## 2023-06-08 LAB — GLUCOSE, CAPILLARY
Glucose-Capillary: 128 mg/dL — ABNORMAL HIGH (ref 70–99)
Glucose-Capillary: 160 mg/dL — ABNORMAL HIGH (ref 70–99)
Glucose-Capillary: 75 mg/dL (ref 70–99)
Glucose-Capillary: 134 mg/dL — ABNORMAL HIGH (ref 70–99)

## 2023-06-08 LAB — CBC
HCT: 34.2 % — ABNORMAL LOW (ref 36.0–46.0)
Hemoglobin: 10.9 g/dL — ABNORMAL LOW (ref 12.0–15.0)
MCH: 28.8 pg (ref 26.0–34.0)
MCHC: 31.9 g/dL (ref 30.0–36.0)
MCV: 90.5 fL (ref 80.0–100.0)
Platelets: 508 10*3/uL — ABNORMAL HIGH (ref 150–400)
RBC: 3.78 MIL/uL — ABNORMAL LOW (ref 3.87–5.11)
RDW: 13.8 % (ref 11.5–15.5)
WBC: 7.1 10*3/uL (ref 4.0–10.5)
nRBC: 0 % (ref 0.0–0.2)

## 2023-06-08 NOTE — Progress Notes (Signed)
PROGRESS NOTE  Deanna Shea UEA:[id number] DOB: Oct 29, 1945 DOA: 05/28/2023 PCP: Darrow Bussing, MD   LOS: 11 days   Brief narrative:  78 years old female with past medical history of atrial fibrillation, hyperlipidemia, type 2 diabetes, hypothyroidism, anxiety, depression, schizophrenia who initially presented to the hospital from assisted living facility with altered mental status and lethargy.  She was also noted to have hyperglycemia with blood glucose levels in the 500s.  In the ED, vitals were stable.  Labs showed hyponatremia, hypokalemia with elevated creatinine and hypomagnesemia.  Troponin was slightly elevated with beta hydroxybutyrate elevation and lactic acid was elevated of 6.5.  Patient was started on broad-spectrum antibiotic for presumed sepsis. CT chest abdomen pelvis showed a 7 mm UVJ calculus with moderate left obstructive hydronephrosis.  There was some gas noted in the bladder and left ureter and collecting system.  Urology was consulted for left ureteral stent placement.  Patient was then admitted to the ICU postoperatively.  Subsequently patient was considered stable for transfer out of ICU to medical service.  Hospital stay complicated by episodes of fever despite otherwise clinical improvement.  CT scan of the abdomen was done which showed renal abscess.  IR has been consulted for possible image guided drainage, but this is not amenable to drainage.  Urology continues to follow and repeat blood cultures pending.  Fever seems to have resolved and she was switched from IV cefepime to IV cefazolin.  If continues to be afebrile can switch to cefadroxil, 3 weeks course, EOT 2/20   Assessment and plan.  Principal Problem:   Sepsis due to urinary tract infection (HCC) Active Problems:   AKI (acute kidney injury) (HCC)   Acute encephalopathy   Diabetic ketoacidosis without coma associated with type 2 diabetes mellitus (HCC)   Ureterolithiasis   Septic shock (HCC)   Severe  sepsis with septic shock due to complicated E. coli urinary tract infection with obstructive uropathy,  E. coli bacteremia 1/20 (cultures since have been negative) Lactic acidosis Patient had obstructive uropathy due to obstructing L UVJ calculus and underwent ureteric stent placement by urology on 05/28/2023.  Despite being on appropriate antibiotic patient continued to have spikes of fever temperature max in the last 24 hours at 101.2 F.  Patient was on cefazolin but after discussing with urology today we will  change to vancomycin and cefepime for broader coverage   latest WBC at 8.8.  Will continue to monitor.  Repeat blood and urine urine culture has been again sent-- NGTD Per ID: switch IV cefepime to IV cefazolin.  If continues to be afebrile by tomorrow can switch to cefadroxil, 3 weeks course, EOT 2/20 - may need further imaging, to be seein ID clinic to decide   Acute respiratory insufficiency, postprocedure, resolved Patient was successfully extubated  currently on room air.  No active issues.   Diabetic ketoacidosis DKA has resolved.  Continue on insulin regimen.  Currently on Semglee  13 units twice daily, Continue sliding scale insulin.  Overall glycemic control has improved   Acute metabolic/septic encephalopathy Secondary to sepsis and DKA.  Resolved at this time.   Hypokalemia -replete  AKI, anion gap metabolic acidosis Resolved.  -  Initial creatinine of 2.4.    Hyponatremia Mild and resolved  Paroxysmal A-fib On sinus rhythm at this time -on ASA only   Hypothyroidism Continue home Synthroid   Depression/Anxiety/Schizophrenia Continue risperidone, Lexapro as clinically appropriate   Thrombocytopenia due to critical illness Appears resolve  DVT prophylaxis: heparin injection 5,000  Units Start: 05/29/23 0600 SCDs Start: 05/28/23 2155   Disposition: Skilled nursing facility as per PT recommendation- suspect Monday??  Status is: Inpatient  Remains inpatient  appropriate because: IV antibiotics, need for rehabilitation, pending clinical improvement, fevers, new renal abscess    Code Status:     Code Status: Full Code    Consultants: PCCM Urology ID  Procedures: Left ureteric stent placement by urology 05/28/2023 Foley catheter placement.     Subjective: Right forearm swelling where IV is placed  Objective: Vitals:   06/08/23 0443 06/08/23 0809  BP: (!) 116/52 (!) 111/51  Pulse: 65 (!) 58  Resp: 18 17  Temp: 98.6 F (37 C) 97.9 F (36.6 C)  SpO2: 96% 96%    Intake/Output Summary (Last 24 hours) at 06/08/2023 1047 Last data filed at 06/08/2023 1011 Gross per 24 hour  Intake 237.83 ml  Output 2100 ml  Net -1862.17 ml   Filed Weights   06/06/23 0500 06/07/23 0431 06/08/23 0439  Weight: 79.4 kg 78.7 kg 77.6 kg   Body mass index is 26.79 kg/m.   Physical Exam:   General: Appearance:     Overweight adult in no acute distress     Lungs:     , respirations unlabored  Heart:    Bradycardic.    MS:   All extremities are intact.   Neurologic:   Awake, alert, but confused at times     Data Review: I have personally reviewed the following laboratory data and studies,  CBC: Recent Labs  Lab 06/03/23 0412 06/04/23 0416 06/06/23 0510 06/07/23 0410 06/08/23 0448  WBC 8.0 8.8 8.3 7.6 7.1  NEUTROABS  --   --  6.5  --   --   HGB 11.5* 12.5 10.9* 10.9* 10.9*  HCT 34.2* 38.1 34.0* 34.2* 34.2*  MCV 87.2 90.3 90.9 90.7 90.5  PLT 153 120* 372 435* 508*   Basic Metabolic Panel: Recent Labs  Lab 06/02/23 0417 06/03/23 0412 06/04/23 0416 06/06/23 0510 06/07/23 0410 06/08/23 0448  NA 138 138 133* 134* 136 137  K 3.5 3.3* 4.6 3.9 3.4* 4.0  CL 106 105 103 105 107 106  CO2 23 25 20* 23 22 22   GLUCOSE 112* 115* 145* 134* 114* 121*  BUN 22 21 23 14 13 11   CREATININE 1.15* 0.98 1.01* 0.88 0.75 0.73  CALCIUM 7.9* 7.5* 7.5* 7.6* 7.8* 8.1*  MG 1.9 1.8 1.8 1.8 1.8  --   PHOS  --  4.1  --   --   --   --    Liver  Function Tests: Recent Labs  Lab 06/02/23 0417  AST 42*  ALT 30  ALKPHOS 133*  BILITOT 0.8  PROT 4.7*  ALBUMIN 1.7*   No results for input(s): "LIPASE", "AMYLASE" in the last 168 hours. No results for input(s): "AMMONIA" in the last 168 hours.  Cardiac Enzymes: No results for input(s): "CKTOTAL", "CKMB", "CKMBINDEX", "TROPONINI" in the last 168 hours.  BNP (last 3 results) Recent Labs    05/28/23 1820 06/03/23 0412  BNP 89.3 160.2*    ProBNP (last 3 results) No results for input(s): "PROBNP" in the last 8760 hours.  CBG: Recent Labs  Lab 06/07/23 0824 06/07/23 1208 06/07/23 1609 06/07/23 2114 06/08/23 0855  GLUCAP 108* 172* 91 163* 128*   Recent Results (from the past 240 hours)  Culture, blood (Routine X 2) w Reflex to ID Panel     Status: None   Collection Time: 06/02/23  9:18 PM  Specimen: BLOOD LEFT HAND  Result Value Ref Range Status   Specimen Description BLOOD LEFT HAND  Final   Special Requests   Final    BOTTLES DRAWN AEROBIC AND ANAEROBIC Blood Culture results may not be optimal due to an inadequate volume of blood received in culture bottles   Culture   Final    NO GROWTH 5 DAYS Performed at [facility], [address], [place], [place] [address]    Report Status 06/07/2023 FINAL  Final  Culture, blood (Routine X 2) w Reflex to ID Panel     Status: None   Collection Time: 06/02/23  9:25 PM   Specimen: BLOOD LEFT HAND  Result Value Ref Range Status   Specimen Description BLOOD LEFT HAND  Final   Special Requests   Final    BOTTLES DRAWN AEROBIC AND ANAEROBIC Blood Culture results may not be optimal due to an inadequate volume of blood received in culture bottles   Culture   Final    NO GROWTH 5 DAYS Performed at [facility], [address], [place], [place] [address]    Report Status 06/07/2023 FINAL  Final  Urine Culture (for pregnant, neutropenic or urologic patients or patients with an indwelling urinary catheter)     Status:  None   Collection Time: 06/05/23  7:28 AM   Specimen: Urine, Catheterized  Result Value Ref Range Status   Specimen Description URINE, CATHETERIZED  Final   Special Requests NONE  Final   Culture   Final    NO GROWTH Performed at [facility], [address] [place], [place] [address]    Report Status 06/06/2023 FINAL  Final  Culture, blood (Routine X 2) w Reflex to ID Panel     Status: None (Preliminary result)   Collection Time: 06/05/23  7:48 AM   Specimen: BLOOD LEFT ARM  Result Value Ref Range Status   Specimen Description BLOOD LEFT ARM  Final   Special Requests   Final    BOTTLES DRAWN AEROBIC AND ANAEROBIC Blood Culture adequate volume   Culture   Final    NO GROWTH 3 DAYS Performed at [facility], [address] [place], [place] [address]    Report Status PENDING  Incomplete  Culture, blood (Routine X 2) w Reflex to ID Panel     Status: None (Preliminary result)   Collection Time: 06/05/23  7:48 AM   Specimen: BLOOD LEFT HAND  Result Value Ref Range Status   Specimen Description BLOOD LEFT HAND  Final   Special Requests   Final    BOTTLES DRAWN AEROBIC AND ANAEROBIC Blood Culture adequate volume   Culture   Final    NO GROWTH 3 DAYS Performed at [facility], [address] [place], [place] [address]    Report Status PENDING  Incomplete     Studies: No results found.   Total care time: 55 minutes.  Joseph Art, DO [facility] 06/08/2023  If 7PM-7AM, please contact night-coverage

## 2023-06-08 NOTE — Progress Notes (Addendum)
Mobility Specialist Progress Note:   06/08/23 1117  Mobility  Activity Transferred from chair to bed  Level of Assistance  (MinG)  Assistive Device Front wheel walker  Distance Ambulated (ft) 2 ft  Activity Response Tolerated well  Mobility Referral Yes  Mobility visit 1 Mobility  Mobility Specialist Start Time (ACUTE ONLY) 1050  Mobility Specialist Stop Time (ACUTE ONLY) 1100  Mobility Specialist Time Calculation (min) (ACUTE ONLY) 10 min   Pt received in chair, requesting to return to bed. Pt was able to stand independently and only needed MinG to pivot to EOB. No complaints stated during session. Pt left in bed with call bell in reach and all needs met.   Leory Plowman  Mobility Specialist Please contact via [employer] office at [phone]

## 2023-06-08 NOTE — Progress Notes (Signed)
11 Days Post-Op Subjective: Doing well, no N/, afebrile, denies pain.   Objective: Vital signs in last 24 hours: Temp:  [98 F (36.7 C)-98.6 F (37 C)] 98.6 F (37 C) (01/31 0443) Pulse Rate:  [63-66] 65 (01/31 0443) Resp:  [18-19] 18 (01/31 0443) BP: (111-116)/(46-53) 116/52 (01/31 0443) SpO2:  [94 %-98 %] 96 % (01/31 0443) Weight:  [77.6 kg] 77.6 kg (01/31 0439)  Intake/Output from previous day: 01/30 0701 - 01/31 0700 In: 237.8 [IV Piggyback:237.8] Out: 1400 [Urine:1400] Intake/Output this shift: No intake/output data recorded.  Physical Exam:  General: Alert and oriented CV: RRR Lungs: Clear Abdomen: Soft, ND, ATTP;  GU: catheter in place draining clear yellow urine   Lab Results: Recent Labs    06/06/23 0510 06/07/23 0410 06/08/23 0448  HGB 10.9* 10.9* 10.9*  HCT 34.0* 34.2* 34.2*   BMET Recent Labs    06/07/23 0410 06/08/23 0448  NA 136 137  K 3.4* 4.0  CL 107 106  CO2 22 22  GLUCOSE 114* 121*  BUN 13 11  CREATININE 0.75 0.73  CALCIUM 7.8* 8.1*     Studies/Results: No results found.   Assessment/Plan:  #sepsis #emphysematous pyelitis  #left ureteral stone   To the OR on 05/28/23 for urgent ureteral stent placement.   Outpatient follow-up for definitive stone management.  URS w/ LL, urology will see as outpatient first to confirm resolution of abscess before URS    Hemodynamically stable and on the floor.  CT A/P showed small areas suspicious for abscess.  None of these were large enough to be drainable and may not have liquefied at this point.   Seen by ID recs include: transition to cefazolin, transition to cefadroxil today since afebrile, ID plans to f/u if with imaging in on 2/18  Continue catheter until discharge  Trend labs.  Serum creatinine within normal range. Please call with questions   LOS: 11 days   Sherle Poe MD 06/08/2023, 7:02 AM [facility]

## 2023-06-08 NOTE — TOC Progression Note (Addendum)
Transition of Care [facility]) - Progression Note    Patient Details  Name: Amariona Rathje MRN: [id number] Date of Birth: March 08, 1946  [facility] CM/SW Contact  Marliss Coots, LCSW Phone Number: 06/08/2023, 10:01 AM  Clinical Narrative:     10:02 AM Patient's daughter in law, Cheralyn, called CSW requesting update on [facility] bed offer. CSW informed Cheralyn that CSW emailed SNF this morning to request update and had yet to receive a response. Cheralyn reiterated that [facility] SNF is primary choice and [facility] is secondary choice.  3:07 PM CSW attempted to call [facility] SNF admissions to follow up on referral, but there was no response and a voicemail was left.  4:44 PM CSW attempted to call [facility] SNF admissions to follow up on referral, but there was no response and a voicemail was left. CSW called patient's daughter in law, Cheralyn, to inform of attempts to contact [facility] SN[facility]. Cheralyn accepted bed offer at [facility]. CSW informed Cheralyn of expected discharge date Monday, per hospitalist. Cheralyn expressed understanding of this plan. [facility] SNF confirmed bed availability for a discharge Monday if insurance authorization is approved then. Insurance authorization is pending (ID [id number]).    Barriers to Discharge: Continued Medical Work up  Expected Discharge Plan and Services In-house Referral: Clinical Social Work     Living arrangements for the past 2 months: Assisted Living Facility                                       Social Determinants of Health (SDOH) Interventions SDOH Screenings   Food Insecurity: No Food Insecurity (05/30/2023)  Housing: Low Risk  (05/30/2023)  Transportation Needs: No Transportation Needs (05/30/2023)  Utilities: Not At Risk (05/30/2023)  Social Connections: Patient Unable To Answer (05/30/2023)  Tobacco Use: Low Risk  (05/28/2023)    Readmission Risk Interventions     No data to display

## 2023-06-08 NOTE — Progress Notes (Signed)
Physical Therapy Treatment Patient Details Name: Deanna Shea MRN: [id number] DOB: 02/23/1946 Today's Date: 06/08/2023   History of Present Illness 78 year old woman who presented to [facility] ED 1/20 for AMS and lethargy.  Found to have ?DKA, urosepsis, obstructed L UVJ calculus with obstructive uropathy. Urology consulted. Taken to OR  05/28/23 for L ureteral stent. PMHx significant for Afib (on ASA/digoxin/propranolol), mitral valve prolapse, HLD, T2DM, nephrolithiasis, hypothyroidism, anxiety/depression, schizophrenia.    PT Comments  The pt was agreeable to session with focus on progressing OOB activity and gait this morning. She was able to follow commands with only slightly increased time, but continued to benefit from multimodal cues for all aspects of mobility and safety. The pt was able to stand with modA of 1 and use of RW, completed x4 sit-stand from EOB, then short walk in her room (32ft) with RW before being too fatigued to continue. She did have a single impulsive return to sitting due to LE fatigue, but was able to recover after seated rest and continue with mobility. Continue to recommend post-acute rehab <3hours/day to facilitate return to independence and safety with mobility.     If plan is discharge home, recommend the following: A lot of help with bathing/dressing/bathroom;Two people to help with walking and/or transfers;Assistance with cooking/housework;Direct supervision/assist for medications management;Direct supervision/assist for financial management;Assist for transportation;Supervision due to cognitive status   Can travel by private vehicle     No  Equipment Recommendations  Other (comment) (defer to post acute)    Recommendations for Other Services       Precautions / Restrictions Precautions Precautions: Fall Precaution Comments: slowed responses, but will participate Restrictions Weight Bearing Restrictions Per Provider Order: No     Mobility  Bed  Mobility Overal bed mobility: Needs Assistance Bed Mobility: Supine to Sit     Supine to sit: Mod assist, HOB elevated, Used rails     General bed mobility comments: modA to manage trunk, cues to continue scooting to EOB    Transfers Overall transfer level: Needs assistance Equipment used: Rolling walker (2 wheels) Transfers: Sit to/from Stand, Bed to chair/wheelchair/BSC Sit to Stand: Mod assist   Step pivot transfers: Min assist       General transfer comment: modA from EOB with poor anterior wt shift and posterior lean. one impulsive sit due to LE fatigue, but later able to give warning when fatigued    Ambulation/Gait Ambulation/Gait assistance: Mod assist Gait Distance (Feet): 15 Feet Assistive device: Rolling walker (2 wheels) Gait Pattern/deviations: Step-through pattern, Decreased stride length, Trunk flexed Gait velocity: decreased Gait velocity interpretation: <1.31 ft/sec, indicative of household ambulator   General Gait Details: small steps with modA to steady, minA to manage RW     Balance Overall balance assessment: Needs assistance Sitting-balance support: Feet supported, Bilateral upper extremity supported Sitting balance-Leahy Scale: Fair Sitting balance - Comments: static sitting without support Postural control: Posterior lean Standing balance support: Bilateral upper extremity supported Standing balance-Leahy Scale: Poor Standing balance comment: modA to rise to standing due to posterior lean, modA for gait                            Cognition Arousal: Alert Behavior During Therapy: Flat affect Overall Cognitive Status: Impaired/Different from baseline Area of Impairment: Following commands, Safety/judgement, Problem solving                       Following Commands: Follows one step  commands inconsistently, Follows one step commands with increased time Safety/Judgement: Decreased awareness of safety, Decreased awareness of  deficits Awareness: Emergent Problem Solving: Slow processing, Decreased initiation, Requires verbal cues, Requires tactile cues General Comments: pt able to follow commands with increased time, needing cues for all safety, technique. cues for awareness. pt soiled of BM and unaware        Exercises General Exercises - Lower Extremity Long Arc Quad: AROM, Both, 10 reps, Seated Hip Flexion/Marching: AROM, Both, 10 reps, Seated Heel Raises: AROM, Both, 10 reps, Seated    General Comments General comments (skin integrity, edema, etc.): VSS on RA      Pertinent Vitals/Pain Pain Assessment Pain Assessment: No/denies pain Pain Intervention(s): Monitored during session     PT Goals (current goals can now be found in the care plan section) Acute Rehab PT Goals Patient Stated Goal: to improve PT Goal Formulation: With patient Time For Goal Achievement: 06/14/23 Potential to Achieve Goals: Fair Progress towards PT goals: Progressing toward goals    Frequency    Min 1X/week       AM-PAC PT "6 Clicks" Mobility   Outcome Measure  Help needed turning from your back to your side while in a flat bed without using bedrails?: A Little Help needed moving from lying on your back to sitting on the side of a flat bed without using bedrails?: A Lot Help needed moving to and from a bed to a chair (including a wheelchair)?: A Lot Help needed standing up from a chair using your arms (e.g., wheelchair or bedside chair)?: A Lot Help needed to walk in hospital room?: Total Help needed climbing 3-5 steps with a railing? : Total 6 Click Score: 11    End of Session Equipment Utilized During Treatment: Gait belt Activity Tolerance: Patient tolerated treatment well;Patient limited by fatigue Patient left: with call bell/phone within reach;in chair;with chair alarm set Nurse Communication: Mobility status PT Visit Diagnosis: Unsteadiness on feet (R26.81);Other symptoms and signs involving the  nervous system (R29.898);Difficulty in walking, not elsewhere classified (R26.2);History of falling (Z91.81);Muscle weakness (generalized) (M62.81);Other abnormalities of gait and mobility (R26.89)     Time: 0454-0981 PT Time Calculation (min) (ACUTE ONLY): 25 min  Charges:    $Gait Training: 8-22 mins $Therapeutic Exercise: 8-22 mins PT General Charges $$ ACUTE PT VISIT: 1 Visit                     Vickki Muff, PT, DPT   Acute Rehabilitation Department Office [phone] Secure Chat Communication Preferred   Ronnie Derby 06/08/2023, 12:31 PM

## 2023-06-08 NOTE — Plan of Care (Signed)
  Problem: Clinical Measurements: Goal: Diagnostic test results will improve Outcome: Progressing Goal: Respiratory complications will improve Outcome: Progressing Goal: Cardiovascular complication will be avoided Outcome: Progressing   Problem: Activity: Goal: Risk for activity intolerance will decrease Outcome: Progressing   Problem: Nutrition: Goal: Adequate nutrition will be maintained Outcome: Progressing   Problem: Coping: Goal: Level of anxiety will decrease Outcome: Progressing   Problem: Elimination: Goal: Will not experience complications related to bowel motility Outcome: Progressing Goal: Will not experience complications related to urinary retention Outcome: Progressing   Problem: Pain Managment: Goal: General experience of comfort will improve and/or be controlled Outcome: Progressing   Problem: Safety: Goal: Ability to remain free from injury will improve Outcome: Progressing   Problem: Skin Integrity: Goal: Risk for impaired skin integrity will decrease Outcome: Progressing   Problem: Activity: Goal: Ability to tolerate increased activity will improve Outcome: Progressing   Problem: Respiratory: Goal: Ability to maintain a clear airway and adequate ventilation will improve Outcome: Progressing   Problem: Role Relationship: Goal: Method of communication will improve Outcome: Progressing   Problem: Education: Goal: Ability to describe self-care measures that may prevent or decrease complications (Diabetes Survival Skills Education) will improve Outcome: Progressing Goal: Individualized Educational Video(s) Outcome: Progressing   Problem: Coping: Goal: Ability to adjust to condition or change in health will improve Outcome: Progressing   Problem: Fluid Volume: Goal: Ability to maintain a balanced intake and output will improve Outcome: Progressing   Problem: Health Behavior/Discharge Planning: Goal: Ability to identify and utilize  available resources and services will improve Outcome: Progressing Goal: Ability to manage health-related needs will improve Outcome: Progressing   Problem: Metabolic: Goal: Ability to maintain appropriate glucose levels will improve Outcome: Progressing   Problem: Nutritional: Goal: Maintenance of adequate nutrition will improve Outcome: Progressing Goal: Progress toward achieving an optimal weight will improve Outcome: Progressing   Problem: Skin Integrity: Goal: Risk for impaired skin integrity will decrease Outcome: Progressing   Problem: Tissue Perfusion: Goal: Adequacy of tissue perfusion will improve Outcome: Progressing

## 2023-06-09 DIAGNOSIS — N390 Urinary tract infection, site not specified: Secondary | ICD-10-CM | POA: Diagnosis not present

## 2023-06-09 DIAGNOSIS — A419 Sepsis, unspecified organism: Secondary | ICD-10-CM | POA: Diagnosis not present

## 2023-06-09 LAB — GLUCOSE, CAPILLARY
Glucose-Capillary: 141 mg/dL — ABNORMAL HIGH (ref 70–99)
Glucose-Capillary: 103 mg/dL — ABNORMAL HIGH (ref 70–99)
Glucose-Capillary: 130 mg/dL — ABNORMAL HIGH (ref 70–99)
Glucose-Capillary: 143 mg/dL — ABNORMAL HIGH (ref 70–99)

## 2023-06-09 MED ORDER — CEFADROXIL 500 MG PO CAPS
1000.0000 mg | ORAL_CAPSULE | Freq: Two times a day (BID) | ORAL | Status: DC
  Administered 2023-06-09 – 2023-06-11 (×5): 1000 mg via ORAL
  Filled 2023-06-09 (×5): qty 2

## 2023-06-09 NOTE — Progress Notes (Signed)
Ok to change cefazolin to cefadroxil until 2/20 per ID and Dr. Sharolyn Douglas.  Ulyses Southward, PharmD, BCIDP, AAHIVP, CPP Infectious Disease Pharmacist 06/09/2023 11:43 AM

## 2023-06-09 NOTE — Progress Notes (Signed)
   06/09/23 0857  Mobility  Activity Transferred from bed to chair  Level of Assistance Moderate assist, patient does 50-74%  Assistive Device Front wheel walker  Activity Response Tolerated fair  Mobility Referral Yes  Mobility visit 1 Mobility  Mobility Specialist Start Time (ACUTE ONLY) 0847  Mobility Specialist Stop Time (ACUTE ONLY) 0857  Mobility Specialist Time Calculation (min) (ACUTE ONLY) 10 min   Mobility Specialist: Progress Note  Pt agreeable to mobility session - received in bed. Pt was asymptomatic throughout session with no complaints. Returned to chair with all needs met - call bell within reach. Chair alarm on.   Barnie Mort, BS Mobility Specialist Please contact via SecureChat or  Rehab office at [phone].

## 2023-06-09 NOTE — Progress Notes (Signed)
PROGRESS NOTE  Deanna Shea ZOX:[id number] DOB: Dec 06, 1945 DOA: 05/28/2023 PCP: Darrow Bussing, MD   LOS: 12 days   Brief narrative: 78 year old female with past medical history of Afib, HLD, DM2, hypothyroidism, anxiety, depression, schizophrenia who initially presented to the hospital from ALF with altered mental status and lethargy. In the ED, CT chest abdomen pelvis showed a 7 mm UVJ calculus with moderate left obstructive hydronephrosis.  There was some gas noted in the bladder and left ureter and collecting system. Patient was noted to be septic. Urology was consulted for left ureteral stent placement.  Patient was then admitted to the ICU postoperatively.  Subsequently patient was considered stable for transfer out of ICU to medical service. Hospital stay complicated by episodes of fever despite otherwise clinical improvement. Repeat CT scan of the abdomen was done which showed renal abscess. IR was consulted for possible image guided drainage, but was not amenable to drainage. Fever seems to have resolved and she was switched from IV cefepime to IV cefazolin. Plan is to switch to cefadroxil for a 3 week course (EOT 2/20). Patient is pending SNF discharge.  Assessment and plan.  Principal Problem:   Sepsis due to urinary tract infection (HCC) Active Problems:   AKI (acute kidney injury) (HCC)   Acute encephalopathy   Diabetic ketoacidosis without coma associated with type 2 diabetes mellitus (HCC)   Ureterolithiasis   Septic shock (HCC)   Severe sepsis with septic shock due to complicated E. coli UTI with obstructive uropathy,  E. coli bacteremia 1/20 (cultures since have been negative) Lactic acidosis Currently afebrile with no leukocytosis Patient had obstructive uropathy due to obstructing L UVJ calculus and underwent ureteric stent placement by urology on 05/28/2023 Hospital stay complicated by episodes of fever despite otherwise clinical improvement, now resolved  Repeat CT  scan of the abdomen showed renal abscess. IR was consulted for possible drainage, but was not amenable to drainage D/C cefazolin and switch to cefadroxil for a 3 week course (EOT 2/20) Repeat blood and urine urine culture has been NGTD Follow-up with ID, urology, may need further imaging   Diabetic ketoacidosis- resolved Type 2 DM Continue SSI, Semglee   Acute metabolic/septic encephalopathy Resolved  AKI, anion gap metabolic acidosis Resolved Initial creatinine of 2.4  Paroxysmal A-fib On ASA only   Hypothyroidism Continue home Synthroid   Depression/Anxiety/Schizophrenia Continue risperidone, Lexapro as clinically appropriate   Thrombocytosis Monitor  DVT prophylaxis: heparin injection 5,000 Units Start: 05/29/23 0600 SCDs Start: 05/28/23 2155   Disposition: SNF  Status is: Inpatient  Remains inpatient appropriate because: Level of care    Code Status:     Code Status: Full Code    Consultants: PCCM Urology ID  Procedures: Left ureteric stent placement by urology 05/28/2023 Foley catheter placement.     Subjective: No new complaints.   Objective: Vitals:   06/09/23 0525 06/09/23 0730  BP: (!) 118/57 108/70  Pulse: 64 70  Resp: 18 18  Temp: 98.4 F (36.9 C) 98.1 F (36.7 C)  SpO2: 96% 96%    Intake/Output Summary (Last 24 hours) at 06/09/2023 1121 Last data filed at 06/09/2023 0954 Gross per 24 hour  Intake 472 ml  Output 4300 ml  Net -3828 ml   Filed Weights   06/07/23 0431 06/08/23 0439 06/09/23 0500  Weight: 78.7 kg 77.6 kg 80.3 kg   Body mass index is 27.73 kg/m.   Physical Exam:  General: NAD  Cardiovascular: S1, S2 present Respiratory: CTAB Abdomen: Soft, nontender, nondistended,  bowel sounds present Musculoskeletal: No bilateral pedal edema noted Skin: Normal Psychiatry: Normal mood    Data Review: I have personally reviewed the following laboratory data and studies,  CBC: Recent Labs  Lab 06/03/23 0412  06/04/23 0416 06/06/23 0510 06/07/23 0410 06/08/23 0448  WBC 8.0 8.8 8.3 7.6 7.1  NEUTROABS  --   --  6.5  --   --   HGB 11.5* 12.5 10.9* 10.9* 10.9*  HCT 34.2* 38.1 34.0* 34.2* 34.2*  MCV 87.2 90.3 90.9 90.7 90.5  PLT 153 120* 372 435* 508*   Basic Metabolic Panel: Recent Labs  Lab 06/03/23 0412 06/04/23 0416 06/06/23 0510 06/07/23 0410 06/08/23 0448  NA 138 133* 134* 136 137  K 3.3* 4.6 3.9 3.4* 4.0  CL 105 103 105 107 106  CO2 25 20* 23 22 22   GLUCOSE 115* 145* 134* 114* 121*  BUN 21 23 14 13 11   CREATININE 0.98 1.01* 0.88 0.75 0.73  CALCIUM 7.5* 7.5* 7.6* 7.8* 8.1*  MG 1.8 1.8 1.8 1.8  --   PHOS 4.1  --   --   --   --    Liver Function Tests: No results for input(s): "AST", "ALT", "ALKPHOS", "BILITOT", "PROT", "ALBUMIN" in the last 168 hours.  No results for input(s): "LIPASE", "AMYLASE" in the last 168 hours. No results for input(s): "AMMONIA" in the last 168 hours.  Cardiac Enzymes: No results for input(s): "CKTOTAL", "CKMB", "CKMBINDEX", "TROPONINI" in the last 168 hours.  BNP (last 3 results) Recent Labs    05/28/23 1820 06/03/23 0412  BNP 89.3 160.2*    ProBNP (last 3 results) No results for input(s): "PROBNP" in the last 8760 hours.  CBG: Recent Labs  Lab 06/08/23 0855 06/08/23 1159 06/08/23 1553 06/08/23 2051 06/09/23 0727  GLUCAP 128* 160* 75 134* 141*   Recent Results (from the past 240 hours)  Culture, blood (Routine X 2) w Reflex to ID Panel     Status: None   Collection Time: 06/02/23  9:18 PM   Specimen: BLOOD LEFT HAND  Result Value Ref Range Status   Specimen Description BLOOD LEFT HAND  Final   Special Requests   Final    BOTTLES DRAWN AEROBIC AND ANAEROBIC Blood Culture results may not be optimal due to an inadequate volume of blood received in culture bottles   Culture   Final    NO GROWTH 5 DAYS Performed at [facility], [address], [place], [place] [address]    Report Status 06/07/2023 FINAL  Final  Culture,  blood (Routine X 2) w Reflex to ID Panel     Status: None   Collection Time: 06/02/23  9:25 PM   Specimen: BLOOD LEFT HAND  Result Value Ref Range Status   Specimen Description BLOOD LEFT HAND  Final   Special Requests   Final    BOTTLES DRAWN AEROBIC AND ANAEROBIC Blood Culture results may not be optimal due to an inadequate volume of blood received in culture bottles   Culture   Final    NO GROWTH 5 DAYS Performed at [facility], [address] [place], [place] [address]    Report Status 06/07/2023 FINAL  Final  Urine Culture (for pregnant, neutropenic or urologic patients or patients with an indwelling urinary catheter)     Status: None   Collection Time: 06/05/23  7:28 AM   Specimen: Urine, Catheterized  Result Value Ref Range Status   Specimen Description URINE, CATHETERIZED  Final  Special Requests NONE  Final   Culture   Final    NO GROWTH Performed at [facility], [address], [place], [place] [address]    Report Status 06/06/2023 FINAL  Final  Culture, blood (Routine X 2) w Reflex to ID Panel     Status: None (Preliminary result)   Collection Time: 06/05/23  7:48 AM   Specimen: BLOOD LEFT ARM  Result Value Ref Range Status   Specimen Description BLOOD LEFT ARM  Final   Special Requests   Final    BOTTLES DRAWN AEROBIC AND ANAEROBIC Blood Culture adequate volume   Culture   Final    NO GROWTH 4 DAYS Performed at [facility], [address], [place], [place] [address]    Report Status PENDING  Incomplete  Culture, blood (Routine X 2) w Reflex to ID Panel     Status: None (Preliminary result)   Collection Time: 06/05/23  7:48 AM   Specimen: BLOOD LEFT HAND  Result Value Ref Range Status   Specimen Description BLOOD LEFT HAND  Final   Special Requests   Final    BOTTLES DRAWN AEROBIC AND ANAEROBIC Blood Culture adequate volume   Culture   Final    NO GROWTH 4 DAYS Performed at [facility], [address], [place], [place] [address]     Report Status PENDING  Incomplete     Studies: No results found.  Briant Cedar, MD [facility] 06/09/2023  If 7PM-7AM, please contact night-coverage

## 2023-06-09 NOTE — Plan of Care (Signed)
  Problem: Clinical Measurements: Goal: Diagnostic test results will improve Outcome: Progressing Goal: Respiratory complications will improve Outcome: Progressing Goal: Cardiovascular complication will be avoided Outcome: Progressing   Problem: Activity: Goal: Risk for activity intolerance will decrease Outcome: Progressing   Problem: Nutrition: Goal: Adequate nutrition will be maintained Outcome: Progressing   Problem: Coping: Goal: Level of anxiety will decrease Outcome: Progressing   Problem: Elimination: Goal: Will not experience complications related to bowel motility Outcome: Progressing Goal: Will not experience complications related to urinary retention Outcome: Progressing   Problem: Pain Managment: Goal: General experience of comfort will improve and/or be controlled Outcome: Progressing   Problem: Safety: Goal: Ability to remain free from injury will improve Outcome: Progressing   Problem: Skin Integrity: Goal: Risk for impaired skin integrity will decrease Outcome: Progressing   Problem: Activity: Goal: Ability to tolerate increased activity will improve Outcome: Progressing   Problem: Respiratory: Goal: Ability to maintain a clear airway and adequate ventilation will improve Outcome: Progressing   Problem: Role Relationship: Goal: Method of communication will improve Outcome: Progressing   Problem: Education: Goal: Ability to describe self-care measures that may prevent or decrease complications (Diabetes Survival Skills Education) will improve Outcome: Progressing Goal: Individualized Educational Video(s) Outcome: Progressing   Problem: Coping: Goal: Ability to adjust to condition or change in health will improve Outcome: Progressing   Problem: Fluid Volume: Goal: Ability to maintain a balanced intake and output will improve Outcome: Progressing   Problem: Health Behavior/Discharge Planning: Goal: Ability to identify and utilize  available resources and services will improve Outcome: Progressing Goal: Ability to manage health-related needs will improve Outcome: Progressing   Problem: Metabolic: Goal: Ability to maintain appropriate glucose levels will improve Outcome: Progressing   Problem: Nutritional: Goal: Maintenance of adequate nutrition will improve Outcome: Progressing Goal: Progress toward achieving an optimal weight will improve Outcome: Progressing   Problem: Skin Integrity: Goal: Risk for impaired skin integrity will decrease Outcome: Progressing   Problem: Tissue Perfusion: Goal: Adequacy of tissue perfusion will improve Outcome: Progressing

## 2023-06-09 NOTE — Plan of Care (Signed)
  Problem: Clinical Measurements: Goal: Diagnostic test results will improve Outcome: Progressing Goal: Respiratory complications will improve Outcome: Progressing Goal: Cardiovascular complication will be avoided Outcome: Progressing   Problem: Clinical Measurements: Goal: Respiratory complications will improve Outcome: Progressing   Problem: Clinical Measurements: Goal: Cardiovascular complication will be avoided Outcome: Progressing   Problem: Activity: Goal: Risk for activity intolerance will decrease Outcome: Progressing   Problem: Nutrition: Goal: Adequate nutrition will be maintained Outcome: Progressing

## 2023-06-10 DIAGNOSIS — A419 Sepsis, unspecified organism: Secondary | ICD-10-CM | POA: Diagnosis not present

## 2023-06-10 DIAGNOSIS — N390 Urinary tract infection, site not specified: Secondary | ICD-10-CM | POA: Diagnosis not present

## 2023-06-10 LAB — CULTURE, BLOOD (ROUTINE X 2)
Culture: NO GROWTH
Culture: NO GROWTH
Special Requests: ADEQUATE
Special Requests: ADEQUATE

## 2023-06-10 LAB — GLUCOSE, CAPILLARY
Glucose-Capillary: 189 mg/dL — ABNORMAL HIGH (ref 70–99)
Glucose-Capillary: 159 mg/dL — ABNORMAL HIGH (ref 70–99)
Glucose-Capillary: 94 mg/dL (ref 70–99)
Glucose-Capillary: 158 mg/dL — ABNORMAL HIGH (ref 70–99)

## 2023-06-10 NOTE — Progress Notes (Signed)
   06/10/23 0924  Mobility  Activity Transferred from bed to chair  Level of Assistance Contact guard assist, steadying assist  Assistive Device Front wheel walker  Activity Response Tolerated fair  Mobility Referral Yes  Mobility visit 1 Mobility  Mobility Specialist Start Time (ACUTE ONLY) 0910  Mobility Specialist Stop Time (ACUTE ONLY) 0924  Mobility Specialist Time Calculation (min) (ACUTE ONLY) 14 min   Mobility Specialist: Progress Note  Pt agreeable to mobility session - received in bed. C/o back pain. Further ambulation deferred. Returned to chair with all needs met - call bell within reach. Chair alarm on.   Barnie Mort, BS Mobility Specialist Please contact via SecureChat or  Rehab office at [phone].

## 2023-06-10 NOTE — Plan of Care (Signed)
   Problem: Clinical Measurements: Goal: Diagnostic test results will improve Outcome: Progressing

## 2023-06-10 NOTE — Progress Notes (Signed)
PROGRESS NOTE  Deanna Shea ZOX:[id number] DOB: 09-21-1945 DOA: 05/28/2023 PCP: Darrow Bussing, MD   LOS: 13 days   Brief narrative: 78 year old female with past medical history of Afib, HLD, DM2, hypothyroidism, anxiety, depression, schizophrenia who initially presented to the hospital from ALF with altered mental status and lethargy. In the ED, CT chest abdomen pelvis showed a 7 mm UVJ calculus with moderate left obstructive hydronephrosis.  There was some gas noted in the bladder and left ureter and collecting system. Patient was noted to be septic. Urology was consulted for left ureteral stent placement.  Patient was then admitted to the ICU postoperatively.  Subsequently patient was considered stable for transfer out of ICU to medical service. Hospital stay complicated by episodes of fever despite otherwise clinical improvement. Repeat CT scan of the abdomen was done which showed renal abscess. IR was consulted for possible image guided drainage, but was not amenable to drainage. Fever seems to have resolved and she was switched from IV cefepime to IV cefazolin. Plan is to switch to cefadroxil for a 3 week course (EOT 2/20). Patient is pending SNF discharge.    Assessment and plan.  Principal Problem:   Sepsis due to urinary tract infection (HCC) Active Problems:   AKI (acute kidney injury) (HCC)   Acute encephalopathy   Diabetic ketoacidosis without coma associated with type 2 diabetes mellitus (HCC)   Ureterolithiasis   Septic shock (HCC)   Severe sepsis with septic shock due to complicated E. coli UTI with obstructive uropathy,  E. coli bacteremia 1/20 (cultures since have been negative) Lactic acidosis Currently afebrile with no leukocytosis Patient had obstructive uropathy due to obstructing L UVJ calculus and underwent ureteric stent placement by urology on 05/28/2023 Hospital stay complicated by episodes of fever despite otherwise clinical improvement, now resolved  Repeat  CT scan of the abdomen showed renal abscess. IR was consulted for possible drainage, but was not amenable to drainage D/C cefazolin and switch to cefadroxil for a 3 week course (EOT 2/20) Repeat blood and urine urine culture has been NGTD Follow-up with ID, urology, may need further imaging   Diabetic ketoacidosis- resolved Type 2 DM Continue SSI, Semglee   Acute metabolic/septic encephalopathy Resolved  AKI, anion gap metabolic acidosis Resolved Initial creatinine of 2.4  Paroxysmal A-fib On ASA only   Hypothyroidism Continue home Synthroid   Depression/Anxiety/Schizophrenia Continue risperidone, Lexapro as clinically appropriate   Thrombocytosis Monitor  DVT prophylaxis: heparin injection 5,000 Units Start: 05/29/23 0600 SCDs Start: 05/28/23 2155   Disposition: SNF  Status is: Inpatient  Remains inpatient appropriate because: Level of care    Code Status:     Code Status: Full Code    Consultants: PCCM Urology ID  Procedures: Left ureteric stent placement by urology 05/28/2023 Foley catheter placement.     Subjective: No new complaints. Met patient sitting on recliner    Objective: Vitals:   06/10/23 0552 06/10/23 0816  BP: (!) 124/54 (!) 115/54  Pulse: 63 66  Resp: 18 20  Temp: 98.2 F (36.8 C) 97.8 F (36.6 C)  SpO2: 95% 96%    Intake/Output Summary (Last 24 hours) at 06/10/2023 1643 Last data filed at 06/10/2023 0806 Gross per 24 hour  Intake 840 ml  Output 2450 ml  Net -1610 ml   Filed Weights   06/07/23 0431 06/08/23 0439 06/09/23 0500  Weight: 78.7 kg 77.6 kg 80.3 kg   Body mass index is 27.73 kg/m.   Physical Exam:  General: NAD  Cardiovascular:  S1, S2 present Respiratory: CTAB Abdomen: Soft, nontender, nondistended, bowel sounds present Musculoskeletal: No bilateral pedal edema noted Skin: Normal Psychiatry: Normal mood    Data Review: I have personally reviewed the following laboratory data and  studies,  CBC: Recent Labs  Lab 06/04/23 0416 06/06/23 0510 06/07/23 0410 06/08/23 0448  WBC 8.8 8.3 7.6 7.1  NEUTROABS  --  6.5  --   --   HGB 12.5 10.9* 10.9* 10.9*  HCT 38.1 34.0* 34.2* 34.2*  MCV 90.3 90.9 90.7 90.5  PLT 120* 372 435* 508*   Basic Metabolic Panel: Recent Labs  Lab 06/04/23 0416 06/06/23 0510 06/07/23 0410 06/08/23 0448  NA 133* 134* 136 137  K 4.6 3.9 3.4* 4.0  CL 103 105 107 106  CO2 20* 23 22 22   GLUCOSE 145* 134* 114* 121*  BUN 23 14 13 11   CREATININE 1.01* 0.88 0.75 0.73  CALCIUM 7.5* 7.6* 7.8* 8.1*  MG 1.8 1.8 1.8  --    Liver Function Tests: No results for input(s): "AST", "ALT", "ALKPHOS", "BILITOT", "PROT", "ALBUMIN" in the last 168 hours.  No results for input(s): "LIPASE", "AMYLASE" in the last 168 hours. No results for input(s): "AMMONIA" in the last 168 hours.  Cardiac Enzymes: No results for input(s): "CKTOTAL", "CKMB", "CKMBINDEX", "TROPONINI" in the last 168 hours.  BNP (last 3 results) Recent Labs    05/28/23 1820 06/03/23 0412  BNP 89.3 160.2*    ProBNP (last 3 results) No results for input(s): "PROBNP" in the last 8760 hours.  CBG: Recent Labs  Lab 06/09/23 1202 06/09/23 1756 06/09/23 2159 06/10/23 0811 06/10/23 1212  GLUCAP 103* 130* 143* 189* 159*   Recent Results (from the past 240 hours)  Culture, blood (Routine X 2) w Reflex to ID Panel     Status: None   Collection Time: 06/02/23  9:18 PM   Specimen: BLOOD LEFT HAND  Result Value Ref Range Status   Specimen Description BLOOD LEFT HAND  Final   Special Requests   Final    BOTTLES DRAWN AEROBIC AND ANAEROBIC Blood Culture results may not be optimal due to an inadequate volume of blood received in culture bottles   Culture   Final    NO GROWTH 5 DAYS Performed at [facility], [address] [place], [place] [address]    Report Status 06/07/2023 FINAL  Final  Culture, blood (Routine X 2) w Reflex to ID Panel     Status: None   Collection  Time: 06/02/23  9:25 PM   Specimen: BLOOD LEFT HAND  Result Value Ref Range Status   Specimen Description BLOOD LEFT HAND  Final   Special Requests   Final    BOTTLES DRAWN AEROBIC AND ANAEROBIC Blood Culture results may not be optimal due to an inadequate volume of blood received in culture bottles   Culture   Final    NO GROWTH 5 DAYS Performed at [facility], [address], [place], [place] [address]    Report Status 06/07/2023 FINAL  Final  Urine Culture (for pregnant, neutropenic or urologic patients or patients with an indwelling urinary catheter)     Status: None   Collection Time: 06/05/23  7:28 AM   Specimen: Urine, Catheterized  Result Value Ref Range Status   Specimen Description URINE, CATHETERIZED  Final   Special Requests NONE  Final   Culture   Final    NO GROWTH Performed at [facility], [address] [place],  [place] [address]    Report Status 06/06/2023 FINAL  Final  Culture, blood (Routine X 2) w Reflex to ID Panel     Status: None   Collection Time: 06/05/23  7:48 AM   Specimen: BLOOD LEFT ARM  Result Value Ref Range Status   Specimen Description BLOOD LEFT ARM  Final   Special Requests   Final    BOTTLES DRAWN AEROBIC AND ANAEROBIC Blood Culture adequate volume   Culture   Final    NO GROWTH 5 DAYS Performed at [facility], [address] [place], [place] [address]    Report Status 06/10/2023 FINAL  Final  Culture, blood (Routine X 2) w Reflex to ID Panel     Status: None   Collection Time: 06/05/23  7:48 AM   Specimen: BLOOD LEFT HAND  Result Value Ref Range Status   Specimen Description BLOOD LEFT HAND  Final   Special Requests   Final    BOTTLES DRAWN AEROBIC AND ANAEROBIC Blood Culture adequate volume   Culture   Final    NO GROWTH 5 DAYS Performed at [facility], [address] [place], [place] [address]    Report Status 06/10/2023 FINAL  Final     Studies: No results found.  Briant Cedar, MD [facility] 06/10/2023  If 7PM-7AM, please contact night-coverage

## 2023-06-11 DIAGNOSIS — M6259 Muscle wasting and atrophy, not elsewhere classified, multiple sites: Secondary | ICD-10-CM | POA: Diagnosis not present

## 2023-06-11 DIAGNOSIS — G9341 Metabolic encephalopathy: Secondary | ICD-10-CM | POA: Diagnosis not present

## 2023-06-11 DIAGNOSIS — A419 Sepsis, unspecified organism: Secondary | ICD-10-CM | POA: Diagnosis not present

## 2023-06-11 DIAGNOSIS — E119 Type 2 diabetes mellitus without complications: Secondary | ICD-10-CM | POA: Diagnosis not present

## 2023-06-11 DIAGNOSIS — N170 Acute kidney failure with tubular necrosis: Secondary | ICD-10-CM | POA: Diagnosis not present

## 2023-06-11 DIAGNOSIS — E039 Hypothyroidism, unspecified: Secondary | ICD-10-CM | POA: Diagnosis not present

## 2023-06-11 DIAGNOSIS — E1110 Type 2 diabetes mellitus with ketoacidosis without coma: Secondary | ICD-10-CM | POA: Diagnosis not present

## 2023-06-11 DIAGNOSIS — F411 Generalized anxiety disorder: Secondary | ICD-10-CM | POA: Diagnosis not present

## 2023-06-11 DIAGNOSIS — N390 Urinary tract infection, site not specified: Secondary | ICD-10-CM | POA: Diagnosis not present

## 2023-06-11 DIAGNOSIS — R531 Weakness: Secondary | ICD-10-CM | POA: Diagnosis not present

## 2023-06-11 DIAGNOSIS — F209 Schizophrenia, unspecified: Secondary | ICD-10-CM | POA: Diagnosis not present

## 2023-06-11 DIAGNOSIS — Z741 Need for assistance with personal care: Secondary | ICD-10-CM | POA: Diagnosis not present

## 2023-06-11 DIAGNOSIS — N139 Obstructive and reflux uropathy, unspecified: Secondary | ICD-10-CM | POA: Diagnosis not present

## 2023-06-11 DIAGNOSIS — Z7401 Bed confinement status: Secondary | ICD-10-CM | POA: Diagnosis not present

## 2023-06-11 DIAGNOSIS — F331 Major depressive disorder, recurrent, moderate: Secondary | ICD-10-CM | POA: Diagnosis not present

## 2023-06-11 DIAGNOSIS — F32A Depression, unspecified: Secondary | ICD-10-CM | POA: Diagnosis not present

## 2023-06-11 DIAGNOSIS — N151 Renal and perinephric abscess: Secondary | ICD-10-CM | POA: Diagnosis not present

## 2023-06-11 DIAGNOSIS — F419 Anxiety disorder, unspecified: Secondary | ICD-10-CM | POA: Diagnosis not present

## 2023-06-11 DIAGNOSIS — M6281 Muscle weakness (generalized): Secondary | ICD-10-CM | POA: Diagnosis not present

## 2023-06-11 DIAGNOSIS — N200 Calculus of kidney: Secondary | ICD-10-CM | POA: Diagnosis not present

## 2023-06-11 DIAGNOSIS — I959 Hypotension, unspecified: Secondary | ICD-10-CM | POA: Diagnosis not present

## 2023-06-11 DIAGNOSIS — I4891 Unspecified atrial fibrillation: Secondary | ICD-10-CM | POA: Diagnosis not present

## 2023-06-11 LAB — GLUCOSE, CAPILLARY
Glucose-Capillary: 183 mg/dL — ABNORMAL HIGH (ref 70–99)
Glucose-Capillary: 142 mg/dL — ABNORMAL HIGH (ref 70–99)

## 2023-06-11 MED ORDER — DOCUSATE SODIUM 100 MG PO CAPS: 100.0000 mg | ORAL_CAPSULE | Freq: Two times a day (BID) | ORAL | Status: AC | PRN

## 2023-06-11 MED ORDER — CEFADROXIL 500 MG PO CAPS: 1000.0000 mg | ORAL_CAPSULE | Freq: Two times a day (BID) | ORAL | Status: DC

## 2023-06-11 MED ORDER — ADULT MULTIVITAMIN W/MINERALS CH: 1.0000 | ORAL_TABLET | Freq: Every day | ORAL | Status: AC

## 2023-06-11 MED ORDER — POLYETHYLENE GLYCOL 3350 17 G PO PACK: 17.0000 g | PACK | Freq: Every day | ORAL | Status: DC | PRN

## 2023-06-11 MED ORDER — ENSURE ENLIVE PO LIQD: 237.0000 mL | Freq: Two times a day (BID) | ORAL | Status: DC

## 2023-06-11 NOTE — TOC Transition Note (Signed)
Transition of Care [facility]) - Discharge Note   Patient Details  Name: Deanna Shea MRN: [id number] Date of Birth: [birth year]  Transition of Care [facility]) CM/SW Contact:  Marliss Coots, LCSW Phone Number: 06/11/2023, 12:57 PM   Clinical Narrative:     Patient will DC to: [facility] Anticipated DC date: 06/11/2023 Family notified: Sung [place]; Daughter in [place]; [phone] Transport by: Sharin Mons   Per MD patient ready for DC to [facility]. RN to call report prior to discharge [phone]). RN, patient, patient's family, and facility notified of DC. Discharge Summary and FL2 sent to facility. DC packet on chart. Ambulance transport requested for patient.   CSW will sign off for now as social work intervention is no longer needed. Please consult [place] again if new needs arise.   Final next level of care: Skilled Nursing Facility Barriers to Discharge: Barriers Resolved   Patient Goals and CMS Choice            Discharge Placement              Patient chooses bed at: [facility] Patient to be transferred to facility by: [facility] Name of family member notified: Charisse March; Daugher in [place]; [phone] Patient and family notified of of transfer: 06/11/23  Discharge Plan and Services Additional resources added to the After Visit Summary for   In-house Referral: Clinical Social Work                                   Social Drivers of Health (SDOH) Interventions SDOH Screenings   Food Insecurity: No Food Insecurity (05/30/2023)  Housing: Low Risk  (05/30/2023)  Transportation Needs: No Transportation Needs (05/30/2023)  Utilities: Not At Risk (05/30/2023)  Social Connections: Patient Unable To Answer (05/30/2023)  Tobacco Use: Low Risk  (05/28/2023)     Readmission Risk Interventions     No data to display

## 2023-06-11 NOTE — Progress Notes (Signed)
Physical Therapy Treatment Patient Details Name: Deanna Shea MRN: [id number] DOB: 07/31/1945 Today's Date: 06/11/2023   History of Present Illness 78 year old woman who presented to [facility] ED 1/20 for AMS and lethargy.  Found to have ?DKA, urosepsis, obstructed L UVJ calculus with obstructive uropathy. Urology consulted. Taken to OR  05/28/23 for L ureteral stent. PMHx significant for Afib (on ASA/digoxin/propranolol), mitral valve prolapse, HLD, T2DM, nephrolithiasis, hypothyroidism, anxiety/depression, schizophrenia.    PT Comments  The pt was agreeable to session despite reports of fatigue from mobility earlier this morning. The pt demos improved ability to complete bed mobility and transition to sitting with minA only at this time. She also demos improved sit-stand transfers but continues to need cues for hand placement and increased time. She demos best progress in ambulation distance, but prefers mobility in the room at this time. Continue to recommend post-acute rehab to progress independence with transfers, gait, and improved stability and endurance.    If plan is discharge home, recommend the following: A lot of help with bathing/dressing/bathroom;Two people to help with walking and/or transfers;Assistance with cooking/housework;Direct supervision/assist for medications management;Direct supervision/assist for financial management;Assist for transportation;Supervision due to cognitive status   Can travel by private vehicle     No  Equipment Recommendations  Other (comment) (defer to post acute)    Recommendations for Other Services       Precautions / Restrictions Precautions Precautions: Fall Precaution Comments: slowed responses, but will participate Restrictions Weight Bearing Restrictions Per Provider Order: No     Mobility  Bed Mobility Overal bed mobility: Needs Assistance Bed Mobility: Supine to Sit, Sit to Supine     Supine to sit: HOB elevated, Used rails, Min  assist Sit to supine: Mod assist   General bed mobility comments: minA to elevate to sitting EOB, modA to return LE to bed and reposition    Transfers Overall transfer level: Needs assistance Equipment used: Rolling walker (2 wheels) Transfers: Sit to/from Stand Sit to Stand: Min assist           General transfer comment: minA from EOB, poor eccentric lower    Ambulation/Gait Ambulation/Gait assistance: Min assist Gait Distance (Feet): 30 Feet Assistive device: Rolling walker (2 wheels) Gait Pattern/deviations: Step-through pattern, Decreased stride length, Trunk flexed Gait velocity: decreased     General Gait Details: small steps with modA to steady, minA to manage RW   Stairs             Wheelchair Mobility     Tilt Bed    Modified Rankin (Stroke Patients Only)       Balance Overall balance assessment: Needs assistance Sitting-balance support: Feet supported, Bilateral upper extremity supported Sitting balance-Leahy Scale: Fair Sitting balance - Comments: static sitting without support Postural control: Posterior lean Standing balance support: Bilateral upper extremity supported Standing balance-Leahy Scale: Poor Standing balance comment: modA to rise to standing due to posterior lean, minA for gait                            Cognition Arousal: Alert Behavior During Therapy: Flat affect Overall Cognitive Status: Impaired/Different from baseline Area of Impairment: Following commands, Safety/judgement, Problem solving                       Following Commands: Follows one step commands inconsistently, Follows one step commands with increased time Safety/Judgement: Decreased awareness of safety, Decreased awareness of deficits Awareness: Emergent Problem Solving: Slow  processing, Decreased initiation, Requires verbal cues, Requires tactile cues General Comments: pt able to follow commands with increased time, needing cues for  all safety, technique. cues for awareness.        Exercises      General Comments General comments (skin integrity, edema, etc.): VSS, friend present      Pertinent Vitals/Pain Pain Assessment Pain Assessment: Faces Faces Pain Scale: Hurts a little bit Pain Location: back Pain Descriptors / Indicators: Discomfort Pain Intervention(s): Limited activity within patient's tolerance, Monitored during session, Repositioned     PT Goals (current goals can now be found in the care plan section) Acute Rehab PT Goals Patient Stated Goal: to improve PT Goal Formulation: With patient Time For Goal Achievement: 06/14/23 Potential to Achieve Goals: Fair Progress towards PT goals: Progressing toward goals    Frequency    Min 1X/week       AM-PAC PT "6 Clicks" Mobility   Outcome Measure  Help needed turning from your back to your side while in a flat bed without using bedrails?: A Little Help needed moving from lying on your back to sitting on the side of a flat bed without using bedrails?: A Lot Help needed moving to and from a bed to a chair (including a wheelchair)?: A Lot Help needed standing up from a chair using your arms (e.g., wheelchair or bedside chair)?: A Lot Help needed to walk in hospital room?: A Lot Help needed climbing 3-5 steps with a railing? : A Lot 6 Click Score: 13    End of Session Equipment Utilized During Treatment: Gait belt Activity Tolerance: Patient tolerated treatment well;Patient limited by fatigue Patient left: with call bell/phone within reach;in chair;with chair alarm set Nurse Communication: Mobility status PT Visit Diagnosis: Unsteadiness on feet (R26.81);Other symptoms and signs involving the nervous system (R29.898);Difficulty in walking, not elsewhere classified (R26.2);History of falling (Z91.81);Muscle weakness (generalized) (M62.81);Other abnormalities of gait and mobility (R26.89)     Time: 1308-6578 PT Time Calculation (min) (ACUTE  ONLY): 16 min  Charges:    $Therapeutic Exercise: 8-22 mins PT General Charges $$ ACUTE PT VISIT: 1 Visit                     Vickki Muff, PT, DPT   Acute Rehabilitation Department Office [phone] Secure Chat Communication Preferred   Ronnie Derby 06/11/2023, 12:51 PM

## 2023-06-11 NOTE — Discharge Summary (Signed)
Physician Discharge Summary   Patient: Deanna Shea MRN: [id number] DOB: 05-29-1945  Admit date:     05/28/2023  Discharge date: 06/11/23  Discharge Physician: Briant Cedar   PCP: Darrow Bussing, MD   Recommendations at discharge:   Follow up with PCP Follow up with ID Follow up with Urology  Discharge Diagnoses: Principal Problem:   Sepsis due to urinary tract infection (HCC) Active Problems:   AKI (acute kidney injury) (HCC)   Acute encephalopathy   Diabetic ketoacidosis without coma associated with type 2 diabetes mellitus (HCC)   Ureterolithiasis   Septic shock [facility])    Hospital Course: 78 year old female with past medical history of Afib, HLD, DM2, hypothyroidism, anxiety, depression, schizophrenia who initially presented to the hospital from ALF with altered mental status and lethargy. In the ED, CT chest abdomen pelvis showed a 7 mm UVJ calculus with moderate left obstructive hydronephrosis.  There was some gas noted in the bladder and left ureter and collecting system. Patient was noted to be septic. Urology was consulted for left ureteral stent placement.  Patient was then admitted to the ICU postoperatively.  Subsequently patient was considered stable for transfer out of ICU to medical service. Hospital stay complicated by episodes of fever despite otherwise clinical improvement. Repeat CT scan of the abdomen was done which showed renal abscess. IR was consulted for possible image guided drainage, but was not amenable to drainage. Fever has since resolved and she was switched from IV cefepime to IV cefazolin. Plan is to switch to cefadroxil for a 3 week course (EOT 2/20).    Today, denies any new complaints   Assessment and Plan: Severe sepsis with septic shock due to complicated E. coli UTI with obstructive uropathy,  E. coli bacteremia 1/20 (cultures since have been negative) Lactic acidosis Currently afebrile with no leukocytosis Patient had obstructive  uropathy due to obstructing L UVJ calculus and underwent ureteric stent placement by urology on 05/28/2023 Repeat CT scan of the abdomen showed renal abscess. IR was consulted for possible drainage, but was not amenable to drainage D/C cefazolin and switch to cefadroxil for a 3 week course (EOT 2/20) Repeat blood and urine urine culture has been NGTD Follow-up with ID, urology, may need further imaging   Diabetic ketoacidosis- resolved s/p insulin drip Type 2 DM Continue PTA metformin   Acute metabolic/septic encephalopathy Resolved   AKI, anion gap metabolic acidosis Resolved Initial creatinine of 2.4   Paroxysmal A-fib On ASA, digoxin   Hypothyroidism Continue Synthroid   Depression/Anxiety/Schizophrenia Continue risperidone, Lexapro   Thrombocytosis Monitor, follow up         Consultants: ID, Urology, PCCM Procedures performed: As noted above Disposition: Skilled nursing facility Diet recommendation:  Cardiac and Carb modified diet    DISCHARGE MEDICATION: Allergies as of 06/11/2023       Reactions   Sulfa Antibiotics Other (See Comments)   Sores in mouth        Medication List     TAKE these medications    acetaminophen 325 MG tablet Commonly known as: TYLENOL Take 2 tablets (650 mg total) by mouth every 6 (six) hours as needed for mild pain (or Fever >/= 101). What changed:  how much to take reasons to take this   aspirin EC 81 MG tablet Take 81 mg by mouth daily.   cefadroxil 500 MG capsule Commonly known as: DURICEF Take 2 capsules (1,000 mg total) by mouth 2 (two) times daily for 21 days.   digoxin  0.25 MG tablet Commonly known as: LANOXIN TAKE 1 TABLET BY MOUTH EVERY OTHER DAY What changed:  how much to take how to take this when to take this additional instructions   docusate sodium 100 MG capsule Commonly known as: COLACE Take 1 capsule (100 mg total) by mouth 2 (two) times daily as needed for mild constipation.    escitalopram 20 MG tablet Commonly known as: LEXAPRO Take 20 mg by mouth daily.   feeding supplement Liqd Take 237 mLs by mouth 2 (two) times daily between meals.   levothyroxine 88 MCG tablet Commonly known as: SYNTHROID Take 88 mcg by mouth daily before breakfast.   metFORMIN 500 MG 24 hr tablet Commonly known as: GLUCOPHAGE-XR Take 500 mg by mouth in the morning and at bedtime.   multivitamin with minerals Tabs tablet Take 1 tablet by mouth daily. Start taking on: June 12, 2023   polyethylene glycol 17 g packet Commonly known as: MIRALAX / GLYCOLAX Take 17 g by mouth daily as needed for moderate constipation.   PREPARATION H EX Apply 1 Application topically in the morning and at bedtime. Apply a small amount to affected area of rectum twice daily for hemorrhoids   propranolol 20 MG tablet Commonly known as: INDERAL TAKE 1 TABLET BY MOUTH 4 TIMES DAILY.   risperiDONE 3 MG tablet Commonly known as: RISPERDAL Take 3 mg by mouth at bedtime.   rosuvastatin 5 MG tablet Commonly known as: CRESTOR Take 2.5 mg by mouth daily.        Discharge Exam: Filed Weights   06/07/23 0431 06/08/23 0439 06/09/23 0500  Weight: 78.7 kg 77.6 kg 80.3 kg   General: NAD  Cardiovascular: S1, S2 present Respiratory: CTAB Abdomen: Soft, nontender, nondistended, bowel sounds present Musculoskeletal: No bilateral pedal edema noted Skin: Normal Psychiatry: Normal mood   Condition at discharge: stable  The results of significant diagnostics from this hospitalization (including imaging, microbiology, ancillary and laboratory) are listed below for reference.   Imaging Studies: CT ABDOMEN PELVIS W CONTRAST Result Date: 06/05/2023 CLINICAL DATA:  Abdominal and flank pain, hematuria. EXAM: CT ABDOMEN AND PELVIS WITH CONTRAST TECHNIQUE: Multidetector CT imaging of the abdomen and pelvis was performed using the standard protocol following bolus administration of intravenous contrast.  RADIATION DOSE REDUCTION: This exam was performed according to the departmental dose-optimization program which includes automated exposure control, adjustment of the mA and/or kV according to patient size and/or use of iterative reconstruction technique. CONTRAST:  75mL OMNIPAQUE IOHEXOL 350 MG/ML SOLN COMPARISON:  CT chest abdomen and pelvis 05/28/2023 FINDINGS: Lower chest: There are new small bilateral pleural effusions with bibasilar atelectasis. Hepatobiliary: Gallstones are present. There is no biliary ductal dilatation. The liver is within normal limits. Pancreas: Unremarkable. No pancreatic ductal dilatation or surrounding inflammatory changes. Spleen: Normal in size without focal abnormality. Adrenals/Urinary Tract: A new left ureteral stent is in place. There is no hydronephrosis. The left kidney is enlarged with mild perinephric fat stranding. Striated nephrogram appearance of the left kidney. There are patchy areas of hypodensity throughout the left kidney, many of which are seen peripherally. Dominant area is seen posteriorly on image 3/37 measuring 2.4 x 1.9 by 2.2 cm. Second largest areas seen anteriorly measuring 2.1 by 2.0 by 1.0 cm image 3/40. Left renal calculi are again seen measuring up to 10 mm. No definite ureteral or bladder calculi are identified. The right kidney and adrenal glands are within normal limits. Stomach/Bowel: There is a small hiatal hernia. Stomach is within normal limits.  Appendix is not seen. Small bowel anastomosis is present in the right lower quadrant. No evidence of bowel wall thickening, distention, or inflammatory changes. Vascular/Lymphatic: No significant vascular findings are present. No enlarged abdominal or pelvic lymph nodes. Reproductive: Uterus and bilateral adnexa are unremarkable. Other: Presacral edema is new from prior. There is new body wall edema. There is no ascites. There some bulging of the anterior abdominal wall, unchanged. Musculoskeletal:  Degenerative changes affect the spine. IMPRESSION: 1. New left ureteral stent in place. No hydronephrosis. 2. Enlarged left kidney with striated nephrogram appearance and perinephric fat stranding. Findings are compatible with pyelonephritis. 3. Patchy areas of hypodensity throughout the left kidney worrisome for renal abscesses. 4. Left nonobstructing renal calculi. 5. New small bilateral pleural effusions with bibasilar atelectasis. 6. New body wall edema and presacral edema. 7. Cholelithiasis. 8. Bosniak I benign renal cyst measuring 2.4 cm. No follow-up imaging is recommended. JACR 2018 Feb; 264-273, Management of the Incidental Renal Mass on CT, [employer] 2021; [id number], Bosniak Classification of Cystic Renal Masses, Version 2019. Electronically Signed   By: Darliss Cheney M.D.   On: 06/05/2023 01:18   DG Chest Port 1 View Result Date: 06/02/2023 CLINICAL DATA:  [id number] Fever [id number] EXAM: PORTABLE CHEST 1 VIEW COMPARISON:  Chest x-ray 05/28/2023 FINDINGS: The heart and mediastinal contours are grossly unchanged given AP portable technique. Low lung volumes. No focal consolidation. Mild pulmonary edema. Likely bilateral trace pleural effusions. No pneumothorax. No acute osseous abnormality. IMPRESSION: Low lung volumes with mild pulmonary edema and likely bilateral trace pleural effusions. Electronically Signed   By: Tish Frederickson M.D.   On: 06/02/2023 21:43   Portable Chest x-ray Result Date: 05/28/2023 CLINICAL DATA:  ET tube EXAM: PORTABLE CHEST 1 VIEW COMPARISON:  05/28/2023 FINDINGS: Endotracheal tube is 3.5 cm above the carina. NG tube is in the stomach. Right central line tip at the cavoatrial junction. Heart mediastinal contours within normal limits. Bibasilar atelectasis. No effusions or pneumothorax. No acute bony abnormality. IMPRESSION: Support devices in expected position as above. Bibasilar atelectasis. Electronically Signed   By: Charlett Nose M.D.   On: 05/28/2023 23:36   DG C-Arm 1-60  Min Result Date: 05/28/2023 CLINICAL DATA:  Cystoscopy and retrograde pyelography, intraoperative examination EXAM: DG C-ARM 1-60 MIN CONTRAST:  Not listed, see operative report FLUOROSCOPY: Fluoroscopy Time:  24.7 seconds Radiation Exposure Index (if provided by the fluoroscopic device): 5.45 mGy Number of Acquired Spot Images: 1 COMPARISON:  None Available. FINDINGS: Single fluoroscopic intraoperative radiograph demonstrates the superior aspect of the double-J ureteral stent extending into the expected upper pole calyx from the left kidney. No hydronephrosis. IMPRESSION: 1. Fluoroscopic guidance for left double-J ureteral stent placement. Electronically Signed   By: Helyn Numbers M.D.   On: 05/28/2023 22:30   CT CHEST ABDOMEN PELVIS WO CONTRAST Result Date: 05/28/2023 CLINICAL DATA:  Sepsis EXAM: CT CHEST, ABDOMEN AND PELVIS WITHOUT CONTRAST TECHNIQUE: Multidetector CT imaging of the chest, abdomen and pelvis was performed following the standard protocol without IV contrast. RADIATION DOSE REDUCTION: This exam was performed according to the departmental dose-optimization program which includes automated exposure control, adjustment of the mA and/or kV according to patient size and/or use of iterative reconstruction technique. COMPARISON:  01/05/2023 FINDINGS: CT CHEST FINDINGS Cardiovascular: Unenhanced imaging of the heart is unremarkable without pericardial effusion. Normal caliber of the thoracic aorta. Evaluation of the vascular lumen is limited without IV contrast. Mediastinum/Nodes: No enlarged mediastinal, hilar, or axillary lymph nodes. Thyroid gland, trachea, and esophagus demonstrate no significant  findings. Lungs/Pleura: Dependent hypoventilatory changes within the lower lobes. No acute airspace disease, effusion, or pneumothorax. The central airways are patent. Musculoskeletal: No acute or destructive bony abnormalities. Reconstructed images demonstrate no additional findings. CT ABDOMEN PELVIS  FINDINGS Hepatobiliary: Diffuse hepatic steatosis. Calcified gallstone and gallbladder sludge without evidence of acute cholecystitis. No biliary duct dilation. Pancreas: Unremarkable unenhanced appearance. Spleen: Unremarkable unenhanced appearance. Adrenals/Urinary Tract: There is an obstructing 7 mm left UVJ calculus, reference image 115/3. There is an additional nonobstructing 9 mm calculus within the distal left ureter reference image 103/3. Multiple nonobstructing left renal calculi are identified measuring up to 9 mm. There is moderate left-sided hydronephrosis and hydroureter. Left kidney is enlarged with diffuse renal edema and prominent perinephric fat stranding. Gas lucencies are identified within the upper pole calices, proximal left ureter, and bladder. If there has been no recent intervention, infection with gas-forming organism is suspected. Please correlate with urinalysis. Nonobstructing faint calcifications are seen within the mid and lower pole calices of the right kidney. No right-sided obstruction. The adrenals are unremarkable. Stomach/Bowel: No bowel obstruction or ileus. The appendix is surgically absent. Prior small bowel resection and reanastomosis. No bowel wall thickening or inflammatory change. Vascular/Lymphatic: Aortic atherosclerosis. No enlarged abdominal or pelvic lymph nodes. Reproductive: Uterus and bilateral adnexa are unremarkable. Other: Trace pelvic free fluid. No free intraperitoneal gas. No abdominal wall hernia. Musculoskeletal: No acute or destructive bony abnormalities. Reconstructed images demonstrate no additional findings. IMPRESSION: 1. Obstructing 7 mm left UVJ calculus, with moderate left-sided obstructive uropathy as well as left renal edema and perinephric fat stranding. Gas within the bladder, left ureter, and left renal collecting system could reflect infection with gas-forming organism or sequela of recent intervention. Please correlate with procedural history  and urinalysis. 2. Other nonobstructing calculi within the right kidney, left kidney, and left ureter as above. 3. Cholelithiasis and gallbladder sludge. No evidence of acute cholecystitis. 4. Hepatic steatosis. 5. Trace pelvic free fluid, likely reactive. 6.  Aortic Atherosclerosis (ICD10-I70.0). Electronically Signed   By: Sharlet Salina M.D.   On: 05/28/2023 19:58   CT Head Wo Contrast Result Date: 05/28/2023 CLINICAL DATA:  Altered level of consciousness, sepsis EXAM: CT HEAD WITHOUT CONTRAST TECHNIQUE: Contiguous axial images were obtained from the base of the skull through the vertex without intravenous contrast. RADIATION DOSE REDUCTION: This exam was performed according to the departmental dose-optimization program which includes automated exposure control, adjustment of the mA and/or kV according to patient size and/or use of iterative reconstruction technique. COMPARISON:  01/02/2023 FINDINGS: Brain: Imaging is limited due to patient motion throughout the study. No evidence of acute infarct or hemorrhage. Lateral ventricles and midline structures are unremarkable. No acute extra-axial fluid collections. No mass effect. Vascular: No hyperdense vessel or unexpected calcification. Skull: Normal. Negative for fracture or focal lesion. Sinuses/Orbits: Significant mucosal thickening within the left maxillary sinus unchanged. Stable opacification of the right sphenoid sinus. Other: None. IMPRESSION: 1. Limited study due to patient motion. 2. No acute intracranial process. 3. 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 Electronically Signed   By: Sharlet Salina M.D.   On: 05/28/2023 19:51   DG Chest Port 1 View Result Date: 05/28/2023 CLINICAL DATA:  Questionable sepsis. EXAM: PORTABLE CHEST 1 VIEW COMPARISON:  01/28/2014 FINDINGS: Heart and mediastinal contours are within normal limits. No focal opacities or effusions. No acute bony abnormality. IMPRESSION: No active disease.  Electronically Signed   By: Charlett Nose M.D.   On: 05/28/2023 18:43  Microbiology: Results for orders placed or performed during the hospital encounter of 05/28/23  Blood Culture (routine x 2)     Status: Abnormal   Collection Time: 05/28/23  6:17 PM   Specimen: BLOOD  Result Value Ref Range Status   Specimen Description BLOOD SITE NOT SPECIFIED  Final   Special Requests   Final    BOTTLES DRAWN AEROBIC AND ANAEROBIC Blood Culture results may not be optimal due to an inadequate volume of blood received in culture bottles   Culture  Setup Time   Final    GRAM NEGATIVE RODS IN BOTH AEROBIC AND ANAEROBIC BOTTLES CRITICAL RESULT CALLED TO, READ BACK BY AND VERIFIED WITH: Gwendolyn Fill A [id number] FCP Performed at [facility], [address], [place], [place] [address]    Culture ESCHERICHIA COLI (A)  Final   Report Status 05/31/2023 FINAL  Final   Organism ID, Bacteria ESCHERICHIA COLI  Final   Organism ID, Bacteria ESCHERICHIA COLI  Final      Susceptibility   Escherichia coli - KIRBY BAUER*    CEFAZOLIN SENSITIVE Sensitive    Escherichia coli - MIC*    AMPICILLIN <=2 SENSITIVE Sensitive     CEFEPIME <=0.12 SENSITIVE Sensitive     CEFTAZIDIME <=1 SENSITIVE Sensitive     CEFTRIAXONE <=0.25 SENSITIVE Sensitive     CIPROFLOXACIN <=0.25 SENSITIVE Sensitive     GENTAMICIN <=1 SENSITIVE Sensitive     IMIPENEM <=0.25 SENSITIVE Sensitive     TRIMETH/SULFA <=20 SENSITIVE Sensitive     AMPICILLIN/SULBACTAM <=2 SENSITIVE Sensitive     PIP/TAZO <=4 SENSITIVE Sensitive ug/mL    * ESCHERICHIA COLI    ESCHERICHIA COLI  Blood Culture ID Panel (Reflexed)     Status: Abnormal   Collection Time: 05/28/23  6:17 PM  Result Value Ref Range Status   Enterococcus faecalis NOT DETECTED NOT DETECTED Final   Enterococcus Faecium NOT DETECTED NOT DETECTED Final   Listeria monocytogenes NOT DETECTED NOT DETECTED Final   Staphylococcus species NOT DETECTED NOT DETECTED Final   Staphylococcus  aureus (BCID) NOT DETECTED NOT DETECTED Final   Staphylococcus epidermidis NOT DETECTED NOT DETECTED Final   Staphylococcus lugdunensis NOT DETECTED NOT DETECTED Final   Streptococcus species NOT DETECTED NOT DETECTED Final   Streptococcus agalactiae NOT DETECTED NOT DETECTED Final   Streptococcus pneumoniae NOT DETECTED NOT DETECTED Final   Streptococcus pyogenes NOT DETECTED NOT DETECTED Final   A.calcoaceticus-baumannii NOT DETECTED NOT DETECTED Final   Bacteroides fragilis NOT DETECTED NOT DETECTED Final   Enterobacterales DETECTED (A) NOT DETECTED Final    Comment: Enterobacterales represent a large order of gram negative bacteria, not a single organism. CRITICAL RESULT CALLED TO, READ BACK BY AND VERIFIED WITH: PHARMD BAILEY A [phone] [id number] FCP    Enterobacter cloacae complex NOT DETECTED NOT DETECTED Final   Escherichia coli DETECTED (A) NOT DETECTED Final    Comment: CRITICAL RESULT CALLED TO, READ BACK BY AND VERIFIED WITH: PHARMD BAILEY A [phone] FCP    Klebsiella aerogenes NOT DETECTED NOT DETECTED Final   Klebsiella oxytoca NOT DETECTED NOT DETECTED Final   Klebsiella pneumoniae NOT DETECTED NOT DETECTED Final   Proteus species NOT DETECTED NOT DETECTED Final   Salmonella species NOT DETECTED NOT DETECTED Final   Serratia marcescens NOT DETECTED NOT DETECTED Final   Haemophilus influenzae NOT DETECTED NOT DETECTED Final   Neisseria meningitidis NOT DETECTED NOT DETECTED Final   Pseudomonas aeruginosa NOT DETECTED NOT DETECTED Final   Stenotrophomonas maltophilia NOT DETECTED NOT DETECTED  Final   Candida albicans NOT DETECTED NOT DETECTED Final   Candida auris NOT DETECTED NOT DETECTED Final   Candida glabrata NOT DETECTED NOT DETECTED Final   Candida krusei NOT DETECTED NOT DETECTED Final   Candida parapsilosis NOT DETECTED NOT DETECTED Final   Candida tropicalis NOT DETECTED NOT DETECTED Final   Cryptococcus neoformans/gattii NOT DETECTED NOT DETECTED Final   CTX-M  ESBL NOT DETECTED NOT DETECTED Final   Carbapenem resistance IMP NOT DETECTED NOT DETECTED Final   Carbapenem resistance KPC NOT DETECTED NOT DETECTED Final   Carbapenem resistance NDM NOT DETECTED NOT DETECTED Final   Carbapenem resist OXA 48 LIKE NOT DETECTED NOT DETECTED Final   Carbapenem resistance VIM NOT DETECTED NOT DETECTED Final    Comment: Performed at [facility], [address], [place], [place] [address]  Resp panel by RT-PCR (RSV, Flu A&B, Covid) Anterior Nasal Swab     Status: None   Collection Time: 05/28/23  6:18 PM   Specimen: Anterior Nasal Swab  Result Value Ref Range Status   SARS Coronavirus 2 by RT PCR NEGATIVE NEGATIVE Final   Influenza A by PCR NEGATIVE NEGATIVE Final   Influenza B by PCR NEGATIVE NEGATIVE Final    Comment: (NOTE) The Xpert Xpress SARS-CoV-2/FLU/RSV plus assay is intended as an aid in the diagnosis of influenza from Nasopharyngeal swab specimens and should not be used as a sole basis for treatment. Nasal washings and aspirates are unacceptable for Xpert Xpress SARS-CoV-2/FLU/RSV testing.  Fact Sheet for Patients: BloggerCourse.com  Fact Sheet for Healthcare Providers: SeriousBroker.it  This test is not yet approved or cleared by the [place] FDA and has been authorized for detection and/or diagnosis of SARS-CoV-2 by FDA under an Emergency Use Authorization (EUA). This EUA will remain in effect (meaning this test can be used) for the duration of the COVID-19 declaration under Section 564(b)(1) of the Act, 21 U.S.C. section 360bbb-3(b)(1), unless the authorization is terminated or revoked.     Resp Syncytial Virus by PCR NEGATIVE NEGATIVE Final    Comment: (NOTE) Fact Sheet for Patients: BloggerCourse.com  Fact Sheet for Healthcare Providers: SeriousBroker.it  This test is not yet approved or cleared by the [place] FDA  and has been authorized for detection and/or diagnosis of SARS-CoV-2 by FDA under an Emergency Use Authorization (EUA). This EUA will remain in effect (meaning this test can be used) for the duration of the COVID-19 declaration under Section 564(b)(1) of the Act, 21 U.S.C. section 360bbb-3(b)(1), unless the authorization is terminated or revoked.  Performed at [facility], [address] [place], [place] [address]   Blood Culture (routine x 2)     Status: Abnormal   Collection Time: 05/28/23  7:00 PM   Specimen: BLOOD  Result Value Ref Range Status   Specimen Description BLOOD SITE NOT SPECIFIED  Final   Special Requests   Final    BOTTLES DRAWN AEROBIC AND ANAEROBIC Blood Culture results may not be optimal due to an inadequate volume of blood received in culture bottles   Culture  Setup Time   Final    GRAM NEGATIVE RODS IN BOTH AEROBIC AND ANAEROBIC BOTTLES CRITICAL VALUE NOTED.  VALUE IS CONSISTENT WITH PREVIOUSLY REPORTED AND CALLED VALUE.    Culture (A)  Final    ESCHERICHIA COLI SUSCEPTIBILITIES PERFORMED ON PREVIOUS CULTURE WITHIN THE LAST 5 DAYS. Performed at [facility], [address] [place], [place] [address]    Report Status 05/31/2023 FINAL  Final  Urine Culture     Status: Abnormal   Collection Time: 05/28/23 10:00 PM   Specimen: Urine, Cystoscope  Result Value Ref Range Status   Specimen Description CYSTOSCOPY  Final   Special Requests   Final    NONE Performed at [facility], [address] [place], [place] [address]    Culture >=100,000 COLONIES/mL ESCHERICHIA COLI (A)  Final   Report Status 05/30/2023 FINAL  Final   Organism ID, Bacteria ESCHERICHIA COLI (A)  Final      Susceptibility   Escherichia coli - MIC*    AMPICILLIN 4 SENSITIVE Sensitive     CEFEPIME <=0.12 SENSITIVE Sensitive     CEFTAZIDIME <=1 SENSITIVE Sensitive     CEFTRIAXONE <=0.25 SENSITIVE Sensitive     CIPROFLOXACIN <=0.25 SENSITIVE Sensitive     GENTAMICIN  <=1 SENSITIVE Sensitive     IMIPENEM <=0.25 SENSITIVE Sensitive     TRIMETH/SULFA <=20 SENSITIVE Sensitive     AMPICILLIN/SULBACTAM <=2 SENSITIVE Sensitive     PIP/TAZO <=4 SENSITIVE Sensitive ug/mL    * >=100,000 COLONIES/mL ESCHERICHIA COLI  MRSA Next Gen by PCR, Nasal     Status: None   Collection Time: 05/28/23 10:04 PM   Specimen: Nasal Mucosa; Nasal Swab  Result Value Ref Range Status   MRSA by PCR Next Gen NOT DETECTED NOT DETECTED Final    Comment: (NOTE) The GeneXpert MRSA Assay (FDA approved for NASAL specimens only), is one component of a comprehensive MRSA colonization surveillance program. It is not intended to diagnose MRSA infection nor to guide or monitor treatment for MRSA infections. Test performance is not FDA approved in patients less than 28 years old. Performed at [facility], [address] [place], [place] [address]   Culture, blood (Routine X 2) w Reflex to ID Panel     Status: None   Collection Time: 06/02/23  9:18 PM   Specimen: BLOOD LEFT HAND  Result Value Ref Range Status   Specimen Description BLOOD LEFT HAND  Final   Special Requests   Final    BOTTLES DRAWN AEROBIC AND ANAEROBIC Blood Culture results may not be optimal due to an inadequate volume of blood received in culture bottles   Culture   Final    NO GROWTH 5 DAYS Performed at [facility], [address] [place], [place] [address]    Report Status 06/07/2023 FINAL  Final  Culture, blood (Routine X 2) w Reflex to ID Panel     Status: None   Collection Time: 06/02/23  9:25 PM   Specimen: BLOOD LEFT HAND  Result Value Ref Range Status   Specimen Description BLOOD LEFT HAND  Final   Special Requests   Final    BOTTLES DRAWN AEROBIC AND ANAEROBIC Blood Culture results may not be optimal due to an inadequate volume of blood received in culture bottles   Culture   Final    NO GROWTH 5 DAYS Performed at [facility], [address] [place], [place] [address]    Report  Status 06/07/2023 FINAL  Final  Urine Culture (for pregnant, neutropenic or urologic patients or patients with an indwelling urinary catheter)     Status: None   Collection Time: 06/05/23  7:28 AM   Specimen: Urine, Catheterized  Result Value Ref Range Status   Specimen Description URINE, CATHETERIZED  Final   Special Requests NONE  Final   Culture   Final    NO GROWTH Performed  at [facility], [address], [place], [place] [address]    Report Status 06/06/2023 FINAL  Final  Culture, blood (Routine X 2) w Reflex to ID Panel     Status: None   Collection Time: 06/05/23  7:48 AM   Specimen: BLOOD LEFT ARM  Result Value Ref Range Status   Specimen Description BLOOD LEFT ARM  Final   Special Requests   Final    BOTTLES DRAWN AEROBIC AND ANAEROBIC Blood Culture adequate volume   Culture   Final    NO GROWTH 5 DAYS Performed at [facility], [address] [place], [place] [address]    Report Status 06/10/2023 FINAL  Final  Culture, blood (Routine X 2) w Reflex to ID Panel     Status: None   Collection Time: 06/05/23  7:48 AM   Specimen: BLOOD LEFT HAND  Result Value Ref Range Status   Specimen Description BLOOD LEFT HAND  Final   Special Requests   Final    BOTTLES DRAWN AEROBIC AND ANAEROBIC Blood Culture adequate volume   Culture   Final    NO GROWTH 5 DAYS Performed at [facility], [address] [place], [place] [address]    Report Status 06/10/2023 FINAL  Final    Labs: CBC: Recent Labs  Lab 06/06/23 0510 06/07/23 0410 06/08/23 0448  WBC 8.3 7.6 7.1  NEUTROABS 6.5  --   --   HGB 10.9* 10.9* 10.9*  HCT 34.0* 34.2* 34.2*  MCV 90.9 90.7 90.5  PLT 372 435* 508*   Basic Metabolic Panel: Recent Labs  Lab 06/06/23 0510 06/07/23 0410 06/08/23 0448  NA 134* 136 137  K 3.9 3.4* 4.0  CL 105 107 106  CO2 23 22 22   GLUCOSE 134* 114* 121*  BUN 14 13 11   CREATININE 0.88 0.75 0.73  CALCIUM 7.6* 7.8* 8.1*  MG 1.8 1.8  --    Liver Function  Tests: No results for input(s): "AST", "ALT", "ALKPHOS", "BILITOT", "PROT", "ALBUMIN" in the last 168 hours. CBG: Recent Labs  Lab 06/10/23 0811 06/10/23 1212 06/10/23 1659 06/10/23 2034 06/11/23 0834  GLUCAP 189* 159* 94 158* 183*    Discharge time spent: greater than 30 minutes.  Signed: Briant Cedar, MD [facility] 06/11/2023

## 2023-06-11 NOTE — Progress Notes (Signed)
Mobility Specialist Progress Note:   06/11/23 1217  Mobility  Activity Transferred from chair to bed;Ambulated with assistance to bathroom  Level of Assistance Minimal assist, patient does 75% or more  Assistive Device Front wheel walker  Distance Ambulated (ft) 20 ft  Activity Response Tolerated well  Mobility Referral Yes  Mobility visit 1 Mobility  Mobility Specialist Start Time (ACUTE ONLY) 1138  Mobility Specialist Stop Time (ACUTE ONLY) 1150  Mobility Specialist Time Calculation (min) (ACUTE ONLY) 12 min   Pt received in chair agreeable to mobility. Required MinA for STS and contact guard during ambulation. Pt was able to ambulate to and from the BR w/o fault. Void and BM complete. Assisted in pericare. Returned to supine in bed w/ call bell and personal belongings in reach. All needs met.  Thompson Grayer Mobility Specialist  Please contact vis Secure Chat or  Rehab Office [phone]

## 2023-06-11 NOTE — TOC Progression Note (Signed)
Transition of Care [facility]) - Progression Note    Patient Details  Name: Deanna Shea MRN: [id number] Date of Birth: 1945-06-08  Transition of [facility]) CM/SW Contact  Marliss Coots, LCSW Phone Number: 06/11/2023, 12:29 PM  Clinical Narrative:     12:29 PM Insurance authorization (ID [id number]) has been approved as is valid 02/01-02/04. Per medical team and SNF, patient is able to discharge to [facility] today. CSW informed patient's daughter in law, Cheralyn, who requested ambulance transportation and expressed understanding of possible copay from ambulance transportation.    Barriers to Discharge: Continued Medical Work up  Expected Discharge Plan and Services In-house Referral: Clinical Social Work     Living arrangements for the past 2 months: Assisted Living Facility                                       Social Determinants of Health (SDOH) Interventions SDOH Screenings   Food Insecurity: No Food Insecurity (05/30/2023)  Housing: Low Risk  (05/30/2023)  Transportation Needs: No Transportation Needs (05/30/2023)  Utilities: Not At Risk (05/30/2023)  Social Connections: Patient Unable To Answer (05/30/2023)  Tobacco Use: Low Risk  (05/28/2023)    Readmission Risk Interventions     No data to display

## 2023-06-11 NOTE — Progress Notes (Signed)
Pt urinated after foley removal.

## 2023-06-11 NOTE — Plan of Care (Signed)
   Problem: Clinical Measurements: Goal: Diagnostic test results will improve Outcome: Not Progressing Goal: Respiratory complications will improve Outcome: Not Progressing

## 2023-06-12 DIAGNOSIS — I4891 Unspecified atrial fibrillation: Secondary | ICD-10-CM | POA: Diagnosis not present

## 2023-06-12 DIAGNOSIS — F411 Generalized anxiety disorder: Secondary | ICD-10-CM | POA: Diagnosis not present

## 2023-06-12 DIAGNOSIS — N170 Acute kidney failure with tubular necrosis: Secondary | ICD-10-CM | POA: Diagnosis not present

## 2023-06-12 DIAGNOSIS — N139 Obstructive and reflux uropathy, unspecified: Secondary | ICD-10-CM | POA: Diagnosis not present

## 2023-06-12 DIAGNOSIS — A419 Sepsis, unspecified organism: Secondary | ICD-10-CM | POA: Diagnosis not present

## 2023-06-12 DIAGNOSIS — N151 Renal and perinephric abscess: Secondary | ICD-10-CM | POA: Diagnosis not present

## 2023-06-12 DIAGNOSIS — E1110 Type 2 diabetes mellitus with ketoacidosis without coma: Secondary | ICD-10-CM | POA: Diagnosis not present

## 2023-06-12 DIAGNOSIS — F331 Major depressive disorder, recurrent, moderate: Secondary | ICD-10-CM | POA: Diagnosis not present

## 2023-06-12 DIAGNOSIS — G9341 Metabolic encephalopathy: Secondary | ICD-10-CM | POA: Diagnosis not present

## 2023-06-14 DIAGNOSIS — I4891 Unspecified atrial fibrillation: Secondary | ICD-10-CM | POA: Diagnosis not present

## 2023-06-14 DIAGNOSIS — E1110 Type 2 diabetes mellitus with ketoacidosis without coma: Secondary | ICD-10-CM | POA: Diagnosis not present

## 2023-06-14 DIAGNOSIS — F411 Generalized anxiety disorder: Secondary | ICD-10-CM | POA: Diagnosis not present

## 2023-06-14 DIAGNOSIS — N151 Renal and perinephric abscess: Secondary | ICD-10-CM | POA: Diagnosis not present

## 2023-06-14 DIAGNOSIS — N170 Acute kidney failure with tubular necrosis: Secondary | ICD-10-CM | POA: Diagnosis not present

## 2023-06-14 DIAGNOSIS — N139 Obstructive and reflux uropathy, unspecified: Secondary | ICD-10-CM | POA: Diagnosis not present

## 2023-06-14 DIAGNOSIS — F331 Major depressive disorder, recurrent, moderate: Secondary | ICD-10-CM | POA: Diagnosis not present

## 2023-06-14 DIAGNOSIS — A419 Sepsis, unspecified organism: Secondary | ICD-10-CM | POA: Diagnosis not present

## 2023-06-14 DIAGNOSIS — G9341 Metabolic encephalopathy: Secondary | ICD-10-CM | POA: Diagnosis not present

## 2023-06-15 DIAGNOSIS — G9341 Metabolic encephalopathy: Secondary | ICD-10-CM | POA: Diagnosis not present

## 2023-06-15 DIAGNOSIS — F32A Depression, unspecified: Secondary | ICD-10-CM | POA: Diagnosis not present

## 2023-06-15 DIAGNOSIS — M6281 Muscle weakness (generalized): Secondary | ICD-10-CM | POA: Diagnosis not present

## 2023-06-15 DIAGNOSIS — F209 Schizophrenia, unspecified: Secondary | ICD-10-CM | POA: Diagnosis not present

## 2023-06-15 DIAGNOSIS — Z741 Need for assistance with personal care: Secondary | ICD-10-CM | POA: Diagnosis not present

## 2023-06-18 DIAGNOSIS — F331 Major depressive disorder, recurrent, moderate: Secondary | ICD-10-CM | POA: Diagnosis not present

## 2023-06-18 DIAGNOSIS — A419 Sepsis, unspecified organism: Secondary | ICD-10-CM | POA: Diagnosis not present

## 2023-06-18 DIAGNOSIS — G9341 Metabolic encephalopathy: Secondary | ICD-10-CM | POA: Diagnosis not present

## 2023-06-18 DIAGNOSIS — I4891 Unspecified atrial fibrillation: Secondary | ICD-10-CM | POA: Diagnosis not present

## 2023-06-18 DIAGNOSIS — N151 Renal and perinephric abscess: Secondary | ICD-10-CM | POA: Diagnosis not present

## 2023-06-18 DIAGNOSIS — N170 Acute kidney failure with tubular necrosis: Secondary | ICD-10-CM | POA: Diagnosis not present

## 2023-06-18 DIAGNOSIS — F411 Generalized anxiety disorder: Secondary | ICD-10-CM | POA: Diagnosis not present

## 2023-06-18 DIAGNOSIS — E1110 Type 2 diabetes mellitus with ketoacidosis without coma: Secondary | ICD-10-CM | POA: Diagnosis not present

## 2023-06-18 DIAGNOSIS — N139 Obstructive and reflux uropathy, unspecified: Secondary | ICD-10-CM | POA: Diagnosis not present

## 2023-06-21 DIAGNOSIS — M79675 Pain in left toe(s): Secondary | ICD-10-CM | POA: Diagnosis not present

## 2023-06-21 DIAGNOSIS — B351 Tinea unguium: Secondary | ICD-10-CM | POA: Diagnosis not present

## 2023-06-21 DIAGNOSIS — E119 Type 2 diabetes mellitus without complications: Secondary | ICD-10-CM | POA: Diagnosis not present

## 2023-06-26 ENCOUNTER — Other Ambulatory Visit: Payer: Self-pay

## 2023-06-26 ENCOUNTER — Ambulatory Visit: Payer: Medicare HMO | Admitting: Infectious Diseases

## 2023-06-26 VITALS — BP 128/80 | HR 74 | Temp 98.0°F

## 2023-06-26 DIAGNOSIS — N151 Renal and perinephric abscess: Secondary | ICD-10-CM | POA: Insufficient documentation

## 2023-06-26 DIAGNOSIS — R7881 Bacteremia: Secondary | ICD-10-CM | POA: Insufficient documentation

## 2023-06-26 DIAGNOSIS — Z5181 Encounter for therapeutic drug level monitoring: Secondary | ICD-10-CM | POA: Insufficient documentation

## 2023-06-26 DIAGNOSIS — B9620 Unspecified Escherichia coli [E. coli] as the cause of diseases classified elsewhere: Secondary | ICD-10-CM | POA: Diagnosis not present

## 2023-06-26 MED ORDER — CEFADROXIL 500 MG PO CAPS: 1000.0000 mg | ORAL_CAPSULE | Freq: Two times a day (BID) | ORAL | 0 refills | Status: DC

## 2023-06-26 MED ORDER — CEFADROXIL 500 MG PO CAPS: 1000.0000 mg | ORAL_CAPSULE | Freq: Two times a day (BID) | ORAL | Status: DC

## 2023-06-26 MED ORDER — CEFADROXIL 500 MG PO CAPS
1000.0000 mg | ORAL_CAPSULE | Freq: Two times a day (BID) | ORAL | Status: DC
Start: 2023-06-29 — End: 2023-07-20

## 2023-06-26 NOTE — Progress Notes (Addendum)
 Patient Active Problem List   Diagnosis Date Noted   Renal abscess 06/26/2023   Medication monitoring encounter 06/26/2023   Bacteremia due to Escherichia coli 06/26/2023   Sepsis due to urinary tract infection (HCC) 05/28/2023   AKI (acute kidney injury) (HCC) 05/28/2023   Acute encephalopathy 05/28/2023   Diabetic ketoacidosis without coma associated with type 2 diabetes mellitus (HCC) 05/28/2023   Ureterolithiasis 05/28/2023   Septic shock (HCC) 05/28/2023   Rhabdomyolysis 01/03/2023   Hypothyroid 07/24/2021   Diet-controlled type 2 diabetes mellitus (HCC) 12/30/2015   SBO (small bowel obstruction) (HCC) 01/27/2014   Atrial fibrillation (HCC) 10/28/2013   Palpitations 10/28/2013   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    Patient's Medications  New Prescriptions   No medications on file  Previous Medications   ACETAMINOPHEN (TYLENOL) 325 MG TABLET    Take 2 tablets (650 mg total) by mouth every 6 (six) hours as needed for mild pain (or Fever >/= 101).   ASPIRIN EC 81 MG TABLET    Take 81 mg by mouth daily.   CEFADROXIL (DURICEF) 500 MG CAPSULE    Take 2 capsules (1,000 mg total) by mouth 2 (two) times daily for 21 days.   DIGOXIN (LANOXIN) 0.25 MG TABLET    TAKE 1 TABLET BY MOUTH EVERY OTHER DAY   DOCUSATE SODIUM (COLACE) 100 MG CAPSULE    Take 1 capsule (100 mg total) by mouth 2 (two) times daily as needed for mild constipation.   ESCITALOPRAM (LEXAPRO) 20 MG TABLET    Take 20 mg by mouth daily.    FEEDING SUPPLEMENT (ENSURE ENLIVE / ENSURE PLUS) LIQD    Take 237 mLs by mouth 2 (two) times daily between meals.   HYDROCORTISONE (PREPARATION H EX)    Apply 1 Application topically in the morning and at bedtime. Apply a small amount to affected area of rectum twice daily for hemorrhoids   LEVOTHYROXINE (SYNTHROID) 88 MCG TABLET    Take 88 mcg by mouth daily before breakfast.   METFORMIN (GLUCOPHAGE-XR) 500 MG 24 HR TABLET    Take 500 mg by mouth in the morning and at bedtime.    MULTIPLE VITAMIN (MULTIVITAMIN WITH MINERALS) TABS TABLET    Take 1 tablet by mouth daily.   POLYETHYLENE GLYCOL (MIRALAX / GLYCOLAX) 17 G PACKET    Take 17 g by mouth daily as needed for moderate constipation.   PROPRANOLOL (INDERAL) 20 MG TABLET    TAKE 1 TABLET BY MOUTH 4 TIMES DAILY.   RISPERIDONE (RISPERDAL) 3 MG TABLET    Take 3 mg by mouth at bedtime.   ROSUVASTATIN (CRESTOR) 5 MG TABLET    Take 2.5 mg by mouth daily.  Modified Medications   No medications on file  Discontinued Medications   No medications on file    Subjective: 78 year old female with PMH of MVP, A-fib, HLD, type II DM, hypothyroidism, anxiety/depression/schizophrenia who is here for HFU for renal abscess/pyelonephritis. Hospital course with septic shock with admission to ICU requiring vasopressors. CT 1/20 Obstructing 7 mm left UVJ calculus, with moderate left-sided obstructive uropathy as well as left renal edema and perinephric fat stranding. Gas within the bladder, left ureter, and left renal collecting system could reflect infection with gas-forming organism or sequela of recent intervention. Underwent cystoscopy, left retrograde pyelogram and left ureteral stent placement by urology on 1/20, urine cx with Pan S E coli. Extubated shortly post procedure. 1/2 blood cx 2/2 sets pan S E coli. Repeat blood cx 1/25 and 1/28 NGTD.  Urine cx NG   Hospital course complicated by fever after which IV cefazolin was broadened to vancomycin and cefepime on 1/28. Repeat CT abdomen/pelvis: Enlarged left kidney with striated nephrogram appearance and perinephric fat stranding. Findings are compatible with pyelonephritis.Patchy areas of hypodensity throughout the left kidney worrisome for renal abscesses.  Per Urology, abscess too small to intervene by IR. She was discharged on 2/3 on course of PO cefadroxil, EOT 06/27/22 with OP Urology and ID fu    06/26/23 She is accompanied by family member. She came in from her ALF. Reports taking  cefadroxil  two tablets twice daily without any issues. She denies any adverse effects such as nausea, vomiting, diarrhea, or abdominal pain. She reports a single episode of hematuria yesterday which has since resolved and denies any dysuria or other urinary symptoms.  Reports long history of urinary stones throughout her life, often passing 'sand' in her urine. However, with advancing age and decreased fluid intake, she has had more difficulty managing this condition. She has not yet had a follow-up appointment with a urologist since her discharge and recommended to fu with them. No other complaints.   Review of Systems: all systems reviewed with pertinent positives and negatives as listed above  Past Medical History:  Diagnosis Date   Allergic rhinitis    Anxiety    Atrial fibrillation (HCC) 10/28/2013   diagnoses in the 20's, well controlled with Digoxin and Inderal    Depression    Diabetes mellitus without complication (HCC)    Gall stones    Heart murmur    Kidney disease, medullary sponge    Kidney stones    Mitral valve disorders(424.0) 10/28/2013   Mitral valve prolapse    congenital    MRSA (methicillin resistant Staphylococcus aureus)    9/10   Osteopenia    dexa 4/09, improved but not resolved 2011 DEXA   Palpitations 10/28/2013   Schizophrenia (HCC)    Shingles    2000   Past Surgical History:  Procedure Laterality Date   APPENDECTOMY  01/29/2014   CYSTOSCOPY W/ STONE MANIPULATION  1990's   CYSTOSCOPY W/ URETERAL STENT PLACEMENT Left 05/28/2023   Procedure: CYSTOSCOPY WITH RETROGRADE PYELOGRAM/URETERAL STENT PLACEMENT;  Surgeon: Adonis Brook, MD;  Location: [facility] OR;  Service: Urology;  Laterality: Left;   ELBOW ARTHROSCOPY Left 2007   ELBOW ARTHROSCOPY Right 2008   "cyst removed; tricep"   ELBOW ARTHROSCOPY WITH TENDON RECONSTRUCTION Right 2005; 2006   EXPLORATORY LAPAROTOMY  01/29/2014   KNEE ARTHROSCOPY Right 2004   "bone chip removed"   KNEE ARTHROSCOPY Right  02/2009   "cleanup loose cartilage"   LAPAROTOMY N/A 01/29/2014   Procedure: EXPLORATORY LAPAROTOMY, SMALL BOWEL RESECTION, APPENDECTOMY;  Surgeon: Manus Rudd, MD;  Location: [facility] OR;  Service: General;  Laterality: N/A;   NASAL SINUS SURGERY  X 2   "for polyps"   SHOULDER ARTHROSCOPY Right 1990's X 2   SHOULDER ARTHROSCOPY W/ ROTATOR CUFF REPAIR Right    SMALL INTESTINE SURGERY  01/29/2014    Social History   Tobacco Use   Smoking status: Never   Smokeless tobacco: Never  Substance Use Topics   Alcohol use: No   Drug use: No    Family History  Problem Relation Age of Onset   Hypertension Mother    Diabetes Mellitus II Mother    Arthritis Father    Cataracts Father    Dementia Father    Heart attack Neg Hx     Allergies  Allergen  Reactions   Sulfa Antibiotics Other (See Comments)    Sores in mouth    Health Maintenance  Topic Date Due   Pneumonia Vaccine 41+ Years old (1 of 2 - PCV) Never done   FOOT EXAM  Never done   OPHTHALMOLOGY EXAM  Never done   Diabetic kidney evaluation - Urine ACR  Never done   Hepatitis C Screening  Never done   DTaP/Tdap/Td (1 - Tdap) Never done   Zoster Vaccines- Shingrix (1 of 2) Never done   COVID-19 Vaccine (3 - Pfizer risk series) 09/02/2019   Medicare Annual Wellness (AWV)  07/11/2023   HEMOGLOBIN A1C  07/06/2023   Diabetic kidney evaluation - eGFR measurement  06/07/2024   Colonoscopy  01/29/2033   INFLUENZA VACCINE  Completed   DEXA SCAN  Completed   HPV VACCINES  Aged Out    Objective: BP 128/80   Pulse 74   Temp 98 F (36.7 C) (Oral)   SpO2 94%    Physical Exam Constitutional:      Appearance: Normal appearance.  HENT:     Head: Normocephalic and atraumatic.      Mouth: Mucous membranes are moist.  Eyes:    Conjunctiva/sclera: Conjunctivae normal.     Pupils: Pupils are equal, round, and b/l symmetrical  Cardiovascular:     Rate and Rhythm: Normal rate and Irregular rhythm.     Heart sounds:    Pulmonary:     Effort: Pulmonary effort is normal.     Breath sounds:   Abdominal:     General: Non distended     Palpations:   Musculoskeletal:        General: sitting in a wheelchair  Skin:    General: Skin is warm and dry.     Comments:  Neurological:     General: grossly non focal     Mental Status: awake, alert and oriented to person, place, and time.   Psychiatric:        Mood and Affect: Mood normal.   Lab Results Lab Results  Component Value Date   WBC 7.1 06/08/2023   HGB 10.9 (L) 06/08/2023   HCT 34.2 (L) 06/08/2023   MCV 90.5 06/08/2023   PLT 508 (H) 06/08/2023    Lab Results  Component Value Date   CREATININE 0.73 06/08/2023   BUN 11 06/08/2023   NA 137 06/08/2023   K 4.0 06/08/2023   CL 106 06/08/2023   CO2 22 06/08/2023    Lab Results  Component Value Date   ALT 30 06/02/2023   AST 42 (H) 06/02/2023   ALKPHOS 133 (H) 06/02/2023   BILITOT 0.8 06/02/2023    Lab Results  Component Value Date   TRIG 114 05/29/2023   No results found for: "LABRPR", "RPRTITER" No results found for: "HIV1RNAQUANT", "HIV1RNAVL", "CD4TABS"   Assessment/Plan # Obstructive Uropathy  # E coli bacteremia # Pyelonephritis  # Renal abscesses  -Underwent cystoscopy, left retrograde pyelogram and left ureteral stent placement by urology on 1/20, urine cx with Pan S E coli.  -1/20 blood cx 2/2 sets pan S E coli. Repeat blood cx 1/25 and 1/28 NGTD.  -1/28 Urine cx NG - Per Urology, abscess too small to intervene by IR  Plan - continue cefadroxil as is, refills sent - CT abdomen/pelvis to fu on renal abscess  - Fu after CT is done in 1-2 weeks - Discussed to fu with Urology for kidney stone management  # Medication Monitoring  - labs today  I have personally spent 30 minutes involved in face-to-face and non-face-to-face activities for this patient on the day of the visit. Professional time spent includes the following activities: Preparing to see the patient  (review of tests), Obtaining and/or reviewing separately obtained history (admission/discharge record), Performing a medically appropriate examination and/or evaluation , Ordering medications/tests/procedures, referring and communicating with other health care professionals, Documenting clinical information in the EMR, Independently interpreting results (not separately reported), Communicating results to the patient/family/caregiver, Counseling and educating the patient/family/caregiver and Care coordination (not separately reported).   Of note, portions of this note may have been created with voice recognition software. While this note has been edited for accuracy, occasional wrong-word or 'sound-a-like' substitutions may have occurred due to the inherent limitations of voice recognition software.   Victoriano Lain, MD [facility] 06/26/2023, 12:54 PM

## 2023-06-26 NOTE — Addendum Note (Signed)
 Addended by: Odette Fraction on: 06/26/2023 07:10 PM   Modules accepted: Orders

## 2023-06-27 LAB — BASIC METABOLIC PANEL
BUN: 20 mg/dL (ref 7–25)
CO2: 24 mmol/L (ref 20–32)
Calcium: 10.1 mg/dL (ref 8.6–10.4)
Chloride: 98 mmol/L (ref 98–110)
Creat: 0.68 mg/dL (ref 0.60–1.00)
Glucose, Bld: 165 mg/dL — ABNORMAL HIGH (ref 65–99)
Potassium: 4.4 mmol/L (ref 3.5–5.3)
Sodium: 135 mmol/L (ref 135–146)

## 2023-06-27 LAB — CBC
HCT: 40.7 % (ref 35.0–45.0)
Hemoglobin: 12.8 g/dL (ref 11.7–15.5)
MCH: 28.6 pg (ref 27.0–33.0)
MCHC: 31.4 g/dL — ABNORMAL LOW (ref 32.0–36.0)
MCV: 90.8 fL (ref 80.0–100.0)
MPV: 11.1 fL (ref 7.5–12.5)
Platelets: 344 10*3/uL (ref 140–400)
RBC: 4.48 10*6/uL (ref 3.80–5.10)
RDW: 13.6 % (ref 11.0–15.0)
WBC: 8.6 10*3/uL (ref 3.8–10.8)

## 2023-06-27 LAB — SEDIMENTATION RATE: Sed Rate: 43 mm/h — ABNORMAL HIGH (ref 0–30)

## 2023-06-27 LAB — C-REACTIVE PROTEIN: CRP: 10.2 mg/L — ABNORMAL HIGH (ref <8.0)

## 2023-07-02 DIAGNOSIS — N151 Renal and perinephric abscess: Secondary | ICD-10-CM | POA: Diagnosis not present

## 2023-07-02 DIAGNOSIS — N200 Calculus of kidney: Secondary | ICD-10-CM | POA: Diagnosis not present

## 2023-07-02 DIAGNOSIS — E119 Type 2 diabetes mellitus without complications: Secondary | ICD-10-CM | POA: Diagnosis not present

## 2023-07-03 ENCOUNTER — Ambulatory Visit (HOSPITAL_COMMUNITY)
Admission: RE | Admit: 2023-07-03 | Discharge: 2023-07-03 | Disposition: A | Discharge status: Home / Self Care | Payer: Medicare HMO | Source: Ambulatory Visit | Attending: Infectious Diseases | Admitting: Infectious Diseases

## 2023-07-03 DIAGNOSIS — K8020 Calculus of gallbladder without cholecystitis without obstruction: Secondary | ICD-10-CM | POA: Diagnosis not present

## 2023-07-03 DIAGNOSIS — N151 Renal and perinephric abscess: Secondary | ICD-10-CM | POA: Diagnosis not present

## 2023-07-03 DIAGNOSIS — K760 Fatty (change of) liver, not elsewhere classified: Secondary | ICD-10-CM | POA: Diagnosis not present

## 2023-07-03 MED ORDER — IOHEXOL 300 MG/ML  SOLN
30.0000 mL | Freq: Once | INTRAMUSCULAR | Status: AC | PRN
  Administered 2023-07-03: 30 mL via ORAL

## 2023-07-03 MED ORDER — IOHEXOL 300 MG/ML  SOLN
100.0000 mL | Freq: Once | INTRAMUSCULAR | Status: AC | PRN
  Administered 2023-07-03: 100 mL via INTRAVENOUS

## 2023-07-04 NOTE — Telephone Encounter (Signed)
 Spoke with Deanna Shea, Med tech regarding message. States that they need end date for antibiotics. This was missing on order. Will need documentation faxed to [phone]. Routed office note. Spoke with patient's daughter in law Deanna Shea who states they have antibiotics now. Confirmed patient will resume this tonight. Was told that pharmacist at SNF assumed end date was yesterday (ct appt) and d/c antibiotics.  Will call office if anything else is needed.  Juanita Laster, RMA

## 2023-07-04 NOTE — Telephone Encounter (Signed)
 Reached out to SNF regarding patient message. Nurse is not available at this time. Will call office back.  [phone] Juanita Laster, RMA

## 2023-07-05 NOTE — Telephone Encounter (Signed)
 Signed orders faxed to SNF to continue antibiotic until follow up appt Juanita Laster, RMA

## 2023-07-12 DIAGNOSIS — M6281 Muscle weakness (generalized): Secondary | ICD-10-CM | POA: Diagnosis not present

## 2023-07-12 DIAGNOSIS — R2681 Unsteadiness on feet: Secondary | ICD-10-CM | POA: Diagnosis not present

## 2023-07-16 ENCOUNTER — Ambulatory Visit: Payer: Medicare HMO | Admitting: Infectious Diseases

## 2023-07-16 ENCOUNTER — Encounter: Payer: Self-pay | Admitting: Infectious Diseases

## 2023-07-16 ENCOUNTER — Other Ambulatory Visit: Payer: Self-pay

## 2023-07-16 VITALS — BP 109/63 | HR 61 | Temp 97.9°F

## 2023-07-16 DIAGNOSIS — N151 Renal and perinephric abscess: Secondary | ICD-10-CM

## 2023-07-16 DIAGNOSIS — R2681 Unsteadiness on feet: Secondary | ICD-10-CM | POA: Diagnosis not present

## 2023-07-16 DIAGNOSIS — E119 Type 2 diabetes mellitus without complications: Secondary | ICD-10-CM

## 2023-07-16 DIAGNOSIS — Z79899 Other long term (current) drug therapy: Secondary | ICD-10-CM | POA: Diagnosis not present

## 2023-07-16 DIAGNOSIS — A498 Other bacterial infections of unspecified site: Secondary | ICD-10-CM | POA: Diagnosis not present

## 2023-07-16 DIAGNOSIS — M6281 Muscle weakness (generalized): Secondary | ICD-10-CM | POA: Diagnosis not present

## 2023-07-16 NOTE — Progress Notes (Signed)
 [id number]  Patient Active Problem List   Diagnosis Date Noted   Medication management 07/16/2023   E coli infection 07/16/2023   Renal abscess 06/26/2023   Medication monitoring encounter 06/26/2023   Bacteremia due to Escherichia coli 06/26/2023   Sepsis due to urinary tract infection (HCC) 05/28/2023   AKI (acute kidney injury) (HCC) 05/28/2023   Acute encephalopathy 05/28/2023   Diabetic ketoacidosis without coma associated with type 2 diabetes mellitus (HCC) 05/28/2023   Ureterolithiasis 05/28/2023   Septic shock (HCC) 05/28/2023   Rhabdomyolysis 01/03/2023   Hypothyroid 07/24/2021   Diet-controlled type 2 diabetes mellitus (HCC) 12/30/2015   SBO (small bowel obstruction) (HCC) 01/27/2014   Atrial fibrillation (HCC) 10/28/2013   Palpitations 10/28/2013   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   Current Outpatient Medications on File Prior to Visit  Medication Sig Dispense Refill   acetaminophen (TYLENOL) 325 MG tablet Take 2 tablets (650 mg total) by mouth every 6 (six) hours as needed for mild pain (or Fever >/= 101). (Patient taking differently: Take 325 mg by mouth every 6 (six) hours as needed for mild pain (pain score 1-3) or moderate pain (pain score 4-6) (or Fever >/= 101).)     aspirin EC 81 MG tablet Take 81 mg by mouth daily.     cefadroxil (DURICEF) 500 MG capsule Take 2 capsules (1,000 mg total) by mouth 2 (two) times daily. 120 capsule    digoxin (LANOXIN) 0.25 MG tablet TAKE 1 TABLET BY MOUTH EVERY OTHER DAY (Patient taking differently: Take 0.25 mg by mouth 3 (three) times a week. Monday,Wednesday,Friday) 45 tablet 1   docusate sodium (COLACE) 100 MG capsule Take 1 capsule (100 mg total) by mouth 2 (two) times daily as needed for mild constipation.     escitalopram (LEXAPRO) 20 MG tablet Take 20 mg by mouth daily.      feeding supplement (ENSURE ENLIVE / ENSURE PLUS) LIQD Take 237 mLs by mouth 2 (two) times daily between meals.     levothyroxine (SYNTHROID) 88 MCG tablet  Take 88 mcg by mouth daily before breakfast.     metFORMIN (GLUCOPHAGE-XR) 500 MG 24 hr tablet Take 500 mg by mouth in the morning and at bedtime.     Multiple Vitamin (MULTIVITAMIN WITH MINERALS) TABS tablet Take 1 tablet by mouth daily.     polyethylene glycol (MIRALAX / GLYCOLAX) 17 g packet Take 17 g by mouth daily as needed for moderate constipation.     propranolol (INDERAL) 20 MG tablet TAKE 1 TABLET BY MOUTH 4 TIMES DAILY. 360 tablet 1   risperiDONE (RISPERDAL) 3 MG tablet Take 3 mg by mouth at bedtime.     rosuvastatin (CRESTOR) 5 MG tablet Take 2.5 mg by mouth daily.     No current facility-administered medications on file prior to visit.   Subjective: 78 year old female with PMH of MVP, A-fib, HLD, type II DM, hypothyroidism, anxiety/depression/schizophrenia who is here for HFU for renal abscess/pyelonephritis. Hospital course with septic shock with admission to ICU requiring vasopressors. CT 1/20 Obstructing 7 mm left UVJ calculus, with moderate left-sided obstructive uropathy as well as left renal edema and perinephric fat stranding. Gas within the bladder, left ureter, and left renal collecting system could reflect infection with gas-forming organism or sequela of recent intervention. Underwent cystoscopy, left retrograde pyelogram and left ureteral stent placement by urology on 1/20, urine cx with Pan S E coli. Extubated shortly post procedure. 1/2 blood cx 2/2 sets pan S E coli. Repeat blood cx 1/25 and 1/28 NGTD.  Urine cx NG   Hospital course complicated by fever after which IV cefazolin was broadened to vancomycin and cefepime on 1/28. Repeat CT abdomen/pelvis: Enlarged left kidney with striated nephrogram appearance and perinephric fat stranding. Findings are compatible with pyelonephritis.Patchy areas of hypodensity throughout the left kidney worrisome for renal abscesses.  Per Urology, abscess too small to intervene by IR. She was discharged on 2/3 on course of PO cefadroxil, EOT  06/27/22 with OP Urology and ID fu    2/25 CT abdomen/pelvis with some improved findings as below  07/16/23 Accompanied by daughter in law. Taking cefadroxil without concerns. Missed 3 doses while getting CT due to some error with pharmacy. Had possible noro virus related diarrhea a week and half ago with 3 days of diarrhea that has resolved. They have called Urology for fu appt and was told they will try to " squeeze in". Denies nausea, vomiting, abdominal pain. Appetite is OK. Discussed last CT findings and plan to continue abtx and repeat scan in a month to which they are agreeable.   Review of Systems: all systems reviewed with pertinent positives and negatives as listed above  Past Medical History:  Diagnosis Date   Allergic rhinitis    Anxiety    Atrial fibrillation (HCC) 10/28/2013   diagnoses in the 20's, well controlled with Digoxin and Inderal    Depression    Diabetes mellitus without complication (HCC)    Gall stones    Heart murmur    Kidney disease, medullary sponge    Kidney stones    Mitral valve disorders(424.0) 10/28/2013   Mitral valve prolapse    congenital    MRSA (methicillin resistant Staphylococcus aureus)    9/10   Osteopenia    dexa 4/09, improved but not resolved 2011 DEXA   Palpitations 10/28/2013   Schizophrenia (HCC)    Shingles    2000   Past Surgical History:  Procedure Laterality Date   APPENDECTOMY  01/29/2014   CYSTOSCOPY W/ STONE MANIPULATION  1990's   CYSTOSCOPY W/ URETERAL STENT PLACEMENT Left 05/28/2023   Procedure: CYSTOSCOPY WITH RETROGRADE PYELOGRAM/URETERAL STENT PLACEMENT;  Surgeon: Adonis Brook, MD;  Location: [facility] OR;  Service: Urology;  Laterality: Left;   ELBOW ARTHROSCOPY Left 2007   ELBOW ARTHROSCOPY Right 2008   "cyst removed; tricep"   ELBOW ARTHROSCOPY WITH TENDON RECONSTRUCTION Right 2005; 2006   EXPLORATORY LAPAROTOMY  01/29/2014   KNEE ARTHROSCOPY Right 2004   "bone chip removed"   KNEE ARTHROSCOPY Right 02/2009    "cleanup loose cartilage"   LAPAROTOMY N/A 01/29/2014   Procedure: EXPLORATORY LAPAROTOMY, SMALL BOWEL RESECTION, APPENDECTOMY;  Surgeon: Manus Rudd, MD;  Location: [facility] OR;  Service: General;  Laterality: N/A;   NASAL SINUS SURGERY  X 2   "for polyps"   SHOULDER ARTHROSCOPY Right 1990's X 2   SHOULDER ARTHROSCOPY W/ ROTATOR CUFF REPAIR Right    SMALL INTESTINE SURGERY  01/29/2014    Social History   Tobacco Use   Smoking status: Never   Smokeless tobacco: Never  Substance Use Topics   Alcohol use: No   Drug use: No    Family History  Problem Relation Age of Onset   Hypertension Mother    Diabetes Mellitus II Mother    Arthritis Father    Cataracts Father    Dementia Father    Heart attack Neg Hx     Allergies  Allergen Reactions   Sulfa Antibiotics Other (See Comments)    Sores in mouth  Health Maintenance  Topic Date Due   Pneumonia Vaccine 27+ Years old (1 of 2 - PCV) Never done   FOOT EXAM  Never done   OPHTHALMOLOGY EXAM  Never done   Diabetic kidney evaluation - Urine ACR  Never done   Hepatitis C Screening  Never done   DTaP/Tdap/Td (1 - Tdap) Never done   Zoster Vaccines- Shingrix (1 of 2) Never done   COVID-19 Vaccine (3 - Pfizer risk series) 09/02/2019   Medicare Annual Wellness (AWV)  07/11/2023   HEMOGLOBIN A1C  07/06/2023   Diabetic kidney evaluation - eGFR measurement  06/25/2024   Colonoscopy  01/29/2033   INFLUENZA VACCINE  Completed   DEXA SCAN  Completed   HPV VACCINES  Aged Out    Objective: BP 109/63   Pulse 61   Temp 97.9 F (36.6 C) (Oral)   SpO2 99%  '  Physical Exam Constitutional:      Appearance: Normal appearance.  HENT:     Head: Normocephalic and atraumatic.      Mouth: Mucous membranes are moist.  Eyes:    Conjunctiva/sclera: Conjunctivae normal.     Pupils: Pupils are equal, round, and b/l symmetrical  Cardiovascular:     Rate and Rhythm: Normal rate and Irregular rhythm.     Heart sounds:   Pulmonary:      Effort: Pulmonary effort is normal.     Breath sounds:   Abdominal:     General: Non distended     Palpations:   Musculoskeletal:        General: sitting in a wheelchair but is ambulatory  Skin:    General: Skin is warm and dry.     Comments:  Neurological:     General: grossly non focal     Mental Status: awake, alert and oriented to person, place, and time.   Psychiatric:        Mood and Affect: Mood normal.   Lab Results Lab Results  Component Value Date   WBC 8.6 06/26/2023   HGB 12.8 06/26/2023   HCT 40.7 06/26/2023   MCV 90.8 06/26/2023   PLT 344 06/26/2023    Lab Results  Component Value Date   CREATININE 0.68 06/26/2023   BUN 20 06/26/2023   NA 135 06/26/2023   K 4.4 06/26/2023   CL 98 06/26/2023   CO2 24 06/26/2023    Lab Results  Component Value Date   ALT 30 06/02/2023   AST 42 (H) 06/02/2023   ALKPHOS 133 (H) 06/02/2023   BILITOT 0.8 06/02/2023    Lab Results  Component Value Date   TRIG 114 05/29/2023   No results found for: "LABRPR", "RPRTITER" No results found for: "HIV1RNAQUANT", "HIV1RNAVL", "CD4TABS"   Assessment/Plan # Obstructive Uropathy  # E coli bacteremia # Renal abscesses  -Underwent cystoscopy, left retrograde pyelogram and left ureteral stent placement by urology on 1/20, urine cx with Pan S E coli.  -1/20 blood cx 2/2 sets pan S E coli. Repeat blood cx 1/25 and 1/28 NGTD.  -1/28 Urine cx NG - Per Urology, abscess too small to intervene by IR - 2/25 CT Left-sided striated nephrogram is largely resolved in the interval. Fluid collections seen throughout the kidney on that examination are largely resolved, there are a few patchy areas of heterogeneous enhancement with loss of cortex for instance in the left upper pole measuring 18 mm, which may reflect residual phlegmonous change.   Plan - continue cefadroxil as is until next CT -  CT abdomen/pelvis, 3/26 or after - Fu after CT scan done - Discussed to fu with Urology for  kidney stone management  # Medication Monitoring  - Labs today  # Type 2 DM - Optimal BG control, Fu with PCP   I have personally spent 30 minutes involved in face-to-face and non-face-to-face activities for this patient on the day of the visit. Professional time spent includes the following activities: Preparing to see the patient (review of tests), Obtaining and/or reviewing separately obtained history (admission/discharge record), Performing a medically appropriate examination and/or evaluation , Ordering medications/tests/procedures, referring and communicating with other health care professionals, Documenting clinical information in the EMR, Independently interpreting results (not separately reported), Communicating results to the patient/family/caregiver, Counseling and educating the patient/family/caregiver and Care coordination (not separately reported).   Of note, portions of this note may have been created with voice recognition software. While this note has been edited for accuracy, occasional wrong-word or 'sound-a-like' substitutions may have occurred due to the inherent limitations of voice recognition software.   Victoriano Lain, MD [facility] 07/16/2023, 1:29 PM

## 2023-07-17 DIAGNOSIS — M6281 Muscle weakness (generalized): Secondary | ICD-10-CM | POA: Diagnosis not present

## 2023-07-17 DIAGNOSIS — R2681 Unsteadiness on feet: Secondary | ICD-10-CM | POA: Diagnosis not present

## 2023-07-17 LAB — CBC
HCT: 41.1 % (ref 35.0–45.0)
Hemoglobin: 13.3 g/dL (ref 11.7–15.5)
MCH: 29.8 pg (ref 27.0–33.0)
MCHC: 32.4 g/dL (ref 32.0–36.0)
MCV: 92.2 fL (ref 80.0–100.0)
MPV: 10.5 fL (ref 7.5–12.5)
Platelets: 335 10*3/uL (ref 140–400)
RBC: 4.46 10*6/uL (ref 3.80–5.10)
RDW: 14.5 % (ref 11.0–15.0)
WBC: 5.6 10*3/uL (ref 3.8–10.8)

## 2023-07-17 LAB — C-REACTIVE PROTEIN: CRP: <3 mg/L (ref <8.0)

## 2023-07-17 LAB — BASIC METABOLIC PANEL
BUN: 17 mg/dL (ref 7–25)
CO2: 28 mmol/L (ref 20–32)
Calcium: 9.9 mg/dL (ref 8.6–10.4)
Chloride: 101 mmol/L (ref 98–110)
Creat: 0.7 mg/dL (ref 0.60–1.00)
Glucose, Bld: 172 mg/dL — ABNORMAL HIGH (ref 65–99)
Potassium: 4.4 mmol/L (ref 3.5–5.3)
Sodium: 139 mmol/L (ref 135–146)

## 2023-07-17 LAB — SEDIMENTATION RATE: Sed Rate: 17 mm/h (ref 0–30)

## 2023-07-18 ENCOUNTER — Other Ambulatory Visit: Payer: Self-pay | Admitting: Urology

## 2023-07-18 DIAGNOSIS — M6281 Muscle weakness (generalized): Secondary | ICD-10-CM | POA: Diagnosis not present

## 2023-07-19 DIAGNOSIS — N201 Calculus of ureter: Secondary | ICD-10-CM | POA: Diagnosis not present

## 2023-07-20 ENCOUNTER — Encounter (HOSPITAL_COMMUNITY): Payer: Self-pay

## 2023-07-20 DIAGNOSIS — M6281 Muscle weakness (generalized): Secondary | ICD-10-CM | POA: Diagnosis not present

## 2023-07-20 DIAGNOSIS — R2681 Unsteadiness on feet: Secondary | ICD-10-CM | POA: Diagnosis not present

## 2023-07-20 NOTE — Progress Notes (Addendum)
 PCP - Darrow Bussing, MD Cardiologist - Varney Daily, MD LOV 11-03-22 epic Infectious disease -07-16-23 epic  Odette Fraction, MD epic  PPM/ICD -  Device Orders -  Rep Notified -   Chest x-ray - 1 v 06-02-23 epic EKG - 05-28-23 epic Stress Test -  ECHO - 07-12-21 epic Cardiac Cath -  CT coronary 2013 epic CBC,BMP 07-16-23 epic  HgbA1c 07-10-23 CE   9.3  Sleep Study -  CPAP -   Fasting Blood Sugar -  Checks Blood Sugar __0___ times a day  Blood Thinner Instructions: Aspirin Instructions:81 mg may continue  ERAS Protcol - PRE-SURGERY n/a   COVID vaccine -yes  Activity--Able to complete ADL's without CP or SOB Anesthesia review:MVP congential, A-fib, DM2,   Lives at [facility]  Patient denies shortness of breath, fever, cough and chest pain at PAT appointment   All instructions explained to the patient, with a verbal understanding of the material. Patient agrees to go over the instructions while at home for a better understanding. Patient also instructed to self quarantine after being tested for COVID-19. The opportunity to ask questions was provided.

## 2023-07-20 NOTE — Patient Instructions (Addendum)
 SURGICAL WAITING ROOM VISITATION  Patients having surgery or a procedure may have no more than 2 support people in the waiting area - these visitors may rotate.    Children under the age of 77 must have an adult with them who is not the patient.  Due to an increase in RSV and influenza rates and associated hospitalizations, children ages 12 and under may not visit patients in [facility] hospitals.  Visitors with respiratory illnesses are discouraged from visiting and should remain at home.  If the patient needs to stay at the hospital during part of their recovery, the visitor guidelines for inpatient rooms apply. Pre-op nurse will coordinate an appropriate time for 1 support person to accompany patient in pre-op.  This support person may not rotate.    Please refer to the [facility] website for the visitor guidelines for Inpatients (after your surgery is over and you are in a regular room).       Your procedure is scheduled on: 07-26-23   Report to [facility] Main Entrance    Report to admitting at     1:00  PM   Call this number if you have problems the morning of surgery [phone]   Do not eat food :After Midnight.   After Midnight you may have the following liquids until   0900 ______ AM/ DAY OF SURGERY   then nothing by mouth  Water Non-Citrus Juices (without pulp, NO RED-Apple, White grape, White cranberry) Black Coffee (NO MILK/CREAM OR CREAMERS, sugar ok)  Clear Tea (NO MILK/CREAM OR CREAMERS, sugar ok) regular and decaf                             Plain Jell-O (NO RED)                                           Fruit ices (not with fruit pulp, NO RED)                                     Popsicles (NO RED)                                                               Sports drinks like Gatorade (NO RED)                             If you have questions, please contact your surgeon's office.   FOLLOW ANY ADDITIONAL PRE OP INSTRUCTIONS YOU RECEIVED FROM  YOUR SURGEON'S OFFICE!!!     Oral Hygiene is also important to reduce your risk of infection.                                    Remember - BRUSH YOUR TEETH THE MORNING OF SURGERY WITH YOUR REGULAR TOOTHPASTE  DENTURES WILL BE REMOVED PRIOR TO SURGERY PLEASE DO NOT APPLY "Poly grip" OR ADHESIVES!!!   Do NOT smoke after  Midnight   Stop all vitamins and herbal supplements 7 days before surgery.   Take these medicines the morning of surgery with A SIP OF WATER: Duricef, Lexapro, Cefadroxil, levothyroxine, propanolol, Digoxin if it is your day  DO NOT TAKE ANY ORAL DIABETIC MEDICATIONS DAY OF YOUR SURGERY                                You may not have any metal on your body including hair pins, jewelry, and body piercing             Do not wear make-up, lotions, powders, perfumes/cologne, or deodorant  Do not wear nail polish including gel and S&S, artificial/acrylic nails, or any other type of covering on natural nails including finger and toenails. If you have artificial nails, gel coating, etc. that needs to be removed by a nail salon please have this removed prior to surgery or surgery may need to be canceled/ delayed if the surgeon/ anesthesia feels like they are unable to be safely monitored.   Do not shave  48 hours prior to surgery.         Do not bring valuables to the hospital. [facility] IS NOT             RESPONSIBLE   FOR VALUABLES.   Contacts, glasses, dentures or bridgework may not be worn into surgery.   Bring small overnight bag day of surgery.   DO NOT BRING YOUR HOME MEDICATIONS TO THE HOSPITAL. PHARMACY WILL DISPENSE MEDICATIONS LISTED ON YOUR MEDICATION LIST TO YOU DURING YOUR ADMISSION IN THE HOSPITAL!    Patients discharged on the day of surgery will not be allowed to drive home.  Someone NEEDS to stay with you for the first 24 hours after anesthesia.   Special Instructions: Bring a copy of your healthcare power of attorney and living will documents the  day of surgery if you haven't scanned them before.              Please read over the following fact sheets you were given: IF YOU HAVE QUESTIONS ABOUT YOUR PRE-OP INSTRUCTIONS PLEASE CALL [phone]    If you test positive for Covid or have been in contact with anyone that has tested positive in the last 10 days please notify you surgeon.    [facility] - Preparing for Surgery Before surgery, you can play an important role.  Because skin is not sterile, your skin needs to be as free of germs as possible.  You can reduce the number of germs on your skin by washing with CHG (chlorahexidine gluconate) soap before surgery.  CHG is an antiseptic cleaner which kills germs and bonds with the skin to continue killing germs even after washing. Please DO NOT use if you have an allergy to CHG or antibacterial soaps.  If your skin becomes reddened/irritated stop using the CHG and inform your nurse when you arrive at Short Stay. Do not shave (including legs and underarms) for at least 48 hours prior to the first CHG shower.  You may shave your face/neck. Please follow these instructions carefully:  1.  Shower with CHG Soap the night before surgery and the  morning of Surgery.  2.  If you choose to wash your hair, wash your hair first as usual with your  normal  shampoo.  3.  After you shampoo, rinse your hair and body thoroughly to remove the  shampoo.                           4.  Use CHG as you would any other liquid soap.  You can apply chg directly  to the skin and wash                       Gently with a scrungie or clean washcloth.  5.  Apply the CHG Soap to your body ONLY FROM THE NECK DOWN.   Do not use on face/ open                           Wound or open sores. Avoid contact with eyes, ears mouth and genitals (private parts).                       Wash face,  Genitals (private parts) with your normal soap.             6.  Wash thoroughly, paying special attention to the area where your surgery   will be performed.  7.  Thoroughly rinse your body with warm water from the neck down.  8.  DO NOT shower/wash with your normal soap after using and rinsing off  the CHG Soap.                9.  Pat yourself dry with a clean towel.            10.  Wear clean pajamas.            11.  Place clean sheets on your bed the night of your first shower and do not  sleep with pets. Day of Surgery : Do not apply any lotions/deodorants the morning of surgery.  Please wear clean clothes to the hospital/surgery center.  FAILURE TO FOLLOW THESE INSTRUCTIONS MAY RESULT IN THE CANCELLATION OF YOUR SURGERY PATIENT SIGNATURE_________________________________  NURSE SIGNATURE__________________________________  ________________________________________________________________________

## 2023-07-23 DIAGNOSIS — R2681 Unsteadiness on feet: Secondary | ICD-10-CM | POA: Diagnosis not present

## 2023-07-23 DIAGNOSIS — M6281 Muscle weakness (generalized): Secondary | ICD-10-CM | POA: Diagnosis not present

## 2023-07-24 NOTE — H&P (Incomplete)
 H&P   Chief Complaint: Left ureteral stone   History of Present Illness: 28 F w/ L ureteral stone presented to the ER on 1/20 for L obstructive pyelonephritis. Patient had left ureteral stent placed. She was lost to follow up but eventually set up for URS today.   She is on abx and was given fluconazole prior to todays procedure.    Past Medical History:  Diagnosis Date   Allergic rhinitis    Anxiety    Atrial fibrillation (HCC) 10/28/2013   diagnoses in the 20's, well controlled with Digoxin and Inderal    Depression    Diabetes mellitus without complication (HCC)    Gall stones    Heart murmur    History of kidney stones    Kidney disease, medullary sponge    Kidney stones    Mitral valve disorders(424.0) 10/28/2013   Mitral valve prolapse    congenital    MRSA (methicillin resistant Staphylococcus aureus)    9/10   Osteopenia    dexa 4/09, improved but not resolved 2011 DEXA   Palpitations 10/28/2013   Schizophrenia (HCC)    Shingles    2000   Past Surgical History:  Procedure Laterality Date   APPENDECTOMY  01/29/2014   CYSTOSCOPY W/ STONE MANIPULATION  1990's   CYSTOSCOPY W/ URETERAL STENT PLACEMENT Left 05/28/2023   Procedure: CYSTOSCOPY WITH RETROGRADE PYELOGRAM/URETERAL STENT PLACEMENT;  Surgeon: Adonis Brook, MD;  Location: [facility] OR;  Service: Urology;  Laterality: Left;   ELBOW ARTHROSCOPY Left 2007   ELBOW ARTHROSCOPY Right 2008   "cyst removed; tricep"   ELBOW ARTHROSCOPY WITH TENDON RECONSTRUCTION Right 2005; 2006   EXPLORATORY LAPAROTOMY  01/29/2014   KNEE ARTHROSCOPY Right 2004   "bone chip removed"   KNEE ARTHROSCOPY Right 02/2009   "cleanup loose cartilage"   LAPAROTOMY N/A 01/29/2014   Procedure: EXPLORATORY LAPAROTOMY, SMALL BOWEL RESECTION, APPENDECTOMY;  Surgeon: Manus Rudd, MD;  Location: [facility] OR;  Service: General;  Laterality: N/A;   NASAL SINUS SURGERY  X 2   "for polyps"   SHOULDER ARTHROSCOPY Right 1990's X 2   SHOULDER ARTHROSCOPY W/  ROTATOR CUFF REPAIR Right    SMALL INTESTINE SURGERY  01/29/2014    Home Medications:  No medications prior to admission.   Allergies:  Allergies  Allergen Reactions   Sulfa Antibiotics Other (See Comments)    Sores in mouth    Family History  Problem Relation Age of Onset   Hypertension Mother    Diabetes Mellitus II Mother    Arthritis Father    Cataracts Father    Dementia Father    Heart attack Neg Hx    Social History:  reports that she has never smoked. She has never used smokeless tobacco. She reports that she does not drink alcohol and does not use drugs.  ROS: A complete review of systems was performed.  All systems are negative except for pertinent findings as noted. ROS   Physical Exam:  Vital signs in last 24 hours:   General:  Alert and oriented, No acute distress Resp: normal WOB on RA Cards: RRR per monitor  Laboratory Data:  No results found for this or any previous visit (from the past 24 hours). No results found for this or any previous visit (from the past 240 hours). Creatinine: No results for input(s): "CREATININE" in the last 168 hours.  Impression/Assessment:   76 F w/ L ureteral stone presented to the ER on 1/20 for L obstructive pyelonephritis. Patient had left  ureteral stent placed. She was lost to follow up but eventually set up for URS today.   We discussed risk benefits alternatives to ureteroscopy.  This included bleeding infection and damage to surrounding structures surrounding structures including ureter as well as urethra.  We discussed the need for stent postoperatively as well as the potential symptoms of stent placement.  We discussed possible inability to complete procedure due to caliber of your ureter or inability to pass stone possibly requiring long-term stent versus nephrostomy tube.  We discussed need for possible second surgery.  Patient voiced their understanding and consent was obtained.   Plan:  Proceed with L URS w/ LL  and stent with RGP  Adonis Brook 07/24/2023, 6:54 PM

## 2023-07-25 ENCOUNTER — Other Ambulatory Visit: Payer: Self-pay

## 2023-07-25 ENCOUNTER — Encounter (HOSPITAL_COMMUNITY)
Admission: RE | Admit: 2023-07-25 | Discharge: 2023-07-25 | Disposition: A | Discharge status: Home / Self Care | Source: Ambulatory Visit | Attending: Urology | Admitting: Urology

## 2023-07-25 ENCOUNTER — Encounter (HOSPITAL_COMMUNITY): Payer: Self-pay

## 2023-07-25 VITALS — BP 127/57 | HR 59 | Temp 98.6°F | Resp 16 | Ht 67.0 in | Wt 155.0 lb

## 2023-07-25 DIAGNOSIS — M6281 Muscle weakness (generalized): Secondary | ICD-10-CM | POA: Diagnosis not present

## 2023-07-25 DIAGNOSIS — R2681 Unsteadiness on feet: Secondary | ICD-10-CM | POA: Diagnosis not present

## 2023-07-25 DIAGNOSIS — E119 Type 2 diabetes mellitus without complications: Secondary | ICD-10-CM | POA: Diagnosis not present

## 2023-07-25 DIAGNOSIS — Z01818 Encounter for other preprocedural examination: Secondary | ICD-10-CM | POA: Insufficient documentation

## 2023-07-25 HISTORY — DX: Cardiac arrhythmia, unspecified: I49.9

## 2023-07-25 HISTORY — DX: Hypothyroidism, unspecified: E03.9

## 2023-07-25 HISTORY — DX: Personal history of urinary calculi: Z87.442

## 2023-07-25 HISTORY — DX: Essential (primary) hypertension: I10

## 2023-07-25 HISTORY — DX: Myoneural disorder, unspecified: G70.9

## 2023-07-25 LAB — GLUCOSE, CAPILLARY: Glucose-Capillary: 204 mg/dL — ABNORMAL HIGH (ref 70–99)

## 2023-07-25 LAB — SURGICAL PCR SCREEN
MRSA, PCR: NEGATIVE
Staphylococcus aureus: NEGATIVE

## 2023-07-26 ENCOUNTER — Encounter (HOSPITAL_COMMUNITY): Payer: Self-pay | Admitting: Urology

## 2023-07-26 ENCOUNTER — Other Ambulatory Visit: Payer: Self-pay

## 2023-07-26 ENCOUNTER — Ambulatory Visit (HOSPITAL_COMMUNITY)

## 2023-07-26 ENCOUNTER — Ambulatory Visit (HOSPITAL_COMMUNITY): Payer: Self-pay | Admitting: Physician Assistant

## 2023-07-26 ENCOUNTER — Ambulatory Visit (HOSPITAL_COMMUNITY)
Admission: RE | Admit: 2023-07-26 | Discharge: 2023-07-26 | Disposition: A | Discharge status: Home / Self Care | Attending: Urology | Admitting: Urology

## 2023-07-26 ENCOUNTER — Ambulatory Visit (HOSPITAL_BASED_OUTPATIENT_CLINIC_OR_DEPARTMENT_OTHER): Admitting: Anesthesiology

## 2023-07-26 ENCOUNTER — Encounter (HOSPITAL_COMMUNITY)
Admission: RE | Disposition: A | Discharge status: Home / Self Care | Payer: Self-pay | Source: Home / Self Care | Attending: Urology

## 2023-07-26 DIAGNOSIS — N201 Calculus of ureter: Secondary | ICD-10-CM | POA: Insufficient documentation

## 2023-07-26 DIAGNOSIS — I4891 Unspecified atrial fibrillation: Secondary | ICD-10-CM | POA: Insufficient documentation

## 2023-07-26 DIAGNOSIS — E119 Type 2 diabetes mellitus without complications: Secondary | ICD-10-CM | POA: Insufficient documentation

## 2023-07-26 DIAGNOSIS — E039 Hypothyroidism, unspecified: Secondary | ICD-10-CM

## 2023-07-26 DIAGNOSIS — Z87442 Personal history of urinary calculi: Secondary | ICD-10-CM | POA: Diagnosis not present

## 2023-07-26 DIAGNOSIS — M8580 Other specified disorders of bone density and structure, unspecified site: Secondary | ICD-10-CM | POA: Insufficient documentation

## 2023-07-26 HISTORY — PX: CYSTOSCOPY/URETEROSCOPY/HOLMIUM LASER/STENT PLACEMENT: SHX6546

## 2023-07-26 LAB — GLUCOSE, CAPILLARY
Glucose-Capillary: 129 mg/dL — ABNORMAL HIGH (ref 70–99)
Glucose-Capillary: 123 mg/dL — ABNORMAL HIGH (ref 70–99)
Glucose-Capillary: 134 mg/dL — ABNORMAL HIGH (ref 70–99)
Glucose-Capillary: 188 mg/dL — ABNORMAL HIGH (ref 70–99)

## 2023-07-26 SURGERY — CYSTOSCOPY/URETEROSCOPY/HOLMIUM LASER/STENT PLACEMENT
Anesthesia: General | Laterality: Left

## 2023-07-26 MED ORDER — METHOCARBAMOL 750 MG PO TABS: 750.0000 mg | ORAL_TABLET | Freq: Four times a day (QID) | ORAL | 0 refills | Status: AC

## 2023-07-26 MED ORDER — ONDANSETRON HCL 4 MG/2ML IJ SOLN
INTRAMUSCULAR | Status: DC | PRN
  Administered 2023-07-26: 4 mg via INTRAVENOUS

## 2023-07-26 MED ORDER — SODIUM CHLORIDE 0.9 % IR SOLN
Status: DC | PRN
  Administered 2023-07-26: 3000 mL

## 2023-07-26 MED ORDER — ACETAMINOPHEN 10 MG/ML IV SOLN: 1000.0000 mg | Freq: Once | INTRAVENOUS | Status: DC | PRN

## 2023-07-26 MED ORDER — PROPOFOL 10 MG/ML IV BOLUS
INTRAVENOUS | Status: DC | PRN
  Administered 2023-07-26: 150 mg via INTRAVENOUS

## 2023-07-26 MED ORDER — INSULIN ASPART 100 UNIT/ML IJ SOLN: 0.0000 [IU] | INTRAMUSCULAR | Status: DC | PRN

## 2023-07-26 MED ORDER — DEXAMETHASONE SODIUM PHOSPHATE 10 MG/ML IJ SOLN
INTRAMUSCULAR | Status: AC
  Filled 2023-07-26: qty 1

## 2023-07-26 MED ORDER — ONDANSETRON HCL 4 MG/2ML IJ SOLN
INTRAMUSCULAR | Status: AC
  Filled 2023-07-26: qty 2

## 2023-07-26 MED ORDER — CHLORHEXIDINE GLUCONATE 0.12 % MT SOLN
15.0000 mL | Freq: Once | OROMUCOSAL | Status: AC
  Administered 2023-07-26: 15 mL via OROMUCOSAL

## 2023-07-26 MED ORDER — IOHEXOL 300 MG/ML  SOLN
INTRAMUSCULAR | Status: DC | PRN
Start: 2023-07-26 — End: 2023-07-27

## 2023-07-26 MED ORDER — PHENAZOPYRIDINE HCL 200 MG PO TABS: 200.0000 mg | ORAL_TABLET | Freq: Three times a day (TID) | ORAL | 0 refills | Status: DC | PRN

## 2023-07-26 MED ORDER — DIPHENHYDRAMINE HCL 50 MG/ML IJ SOLN
INTRAMUSCULAR | Status: DC | PRN
  Administered 2023-07-26: 12.5 mg via INTRAVENOUS

## 2023-07-26 MED ORDER — VANCOMYCIN HCL IN DEXTROSE 1-5 GM/200ML-% IV SOLN
1000.0000 mg | INTRAVENOUS | Status: AC
  Administered 2023-07-26: 1000 mg via INTRAVENOUS
  Filled 2023-07-26: qty 200

## 2023-07-26 MED ORDER — DEXAMETHASONE SODIUM PHOSPHATE 10 MG/ML IJ SOLN
INTRAMUSCULAR | Status: DC | PRN
Start: 2023-07-26 — End: 2023-07-27
  Administered 2023-07-26: 5 mg via INTRAVENOUS

## 2023-07-26 MED ORDER — ORAL CARE MOUTH RINSE: 15.0000 mL | Freq: Once | OROMUCOSAL | Status: AC

## 2023-07-26 MED ORDER — FENTANYL CITRATE (PF) 100 MCG/2ML IJ SOLN
INTRAMUSCULAR | Status: AC
  Filled 2023-07-26: qty 2

## 2023-07-26 MED ORDER — LIDOCAINE HCL (CARDIAC) PF 100 MG/5ML IV SOSY
PREFILLED_SYRINGE | INTRAVENOUS | Status: DC | PRN
  Administered 2023-07-26: 80 mg via INTRATRACHEAL

## 2023-07-26 MED ORDER — FENTANYL CITRATE PF 50 MCG/ML IJ SOSY: 25.0000 ug | PREFILLED_SYRINGE | INTRAMUSCULAR | Status: DC | PRN

## 2023-07-26 MED ORDER — GENTAMICIN SULFATE 40 MG/ML IJ SOLN
5.0000 mg/kg | INTRAVENOUS | Status: AC
  Administered 2023-07-26: 400 mg via INTRAVENOUS
  Filled 2023-07-26: qty 10

## 2023-07-26 MED ORDER — ALFUZOSIN HCL ER 10 MG PO TB24: 10.0000 mg | ORAL_TABLET | Freq: Every day | ORAL | 0 refills | Status: AC

## 2023-07-26 MED ORDER — FENTANYL CITRATE (PF) 100 MCG/2ML IJ SOLN
INTRAMUSCULAR | Status: DC | PRN
  Administered 2023-07-26: 50 ug via INTRAVENOUS

## 2023-07-26 MED ORDER — LACTATED RINGERS IV SOLN: INTRAVENOUS | Status: DC | PRN

## 2023-07-26 SURGICAL SUPPLY — 19 items
BAG URO CATCHER STRL LF (MISCELLANEOUS) ×1 IMPLANT
BASKET ZERO TIP NITINOL 2.4FR (BASKET) IMPLANT
CATH URETL OPEN 5X70 (CATHETERS) ×1 IMPLANT
CLOTH BEACON ORANGE TIMEOUT ST (SAFETY) ×1 IMPLANT
EXTRACTOR STONE 1.7FRX115CM (UROLOGICAL SUPPLIES) IMPLANT
FIBER LASER MOSES 200 DFL (Laser) IMPLANT
FIBER LASER MOSES 365 DFL (Laser) IMPLANT
GLOVE SURG LX STRL 8.0 MICRO (GLOVE) ×1 IMPLANT
GOWN STRL REUS W/ TWL XL LVL3 (GOWN DISPOSABLE) ×1 IMPLANT
GUIDEWIRE ANG ZIPWIRE 038X150 (WIRE) IMPLANT
GUIDEWIRE STR DUAL SENSOR (WIRE) ×1 IMPLANT
KIT TURNOVER KIT A (KITS) IMPLANT
MANIFOLD NEPTUNE II (INSTRUMENTS) ×1 IMPLANT
PACK CYSTO (CUSTOM PROCEDURE TRAY) ×1 IMPLANT
SHEATH NAVIGATOR HD 12/14X28 (SHEATH) IMPLANT
SHEATH NAVIGATOR HD 12/14X36 (SHEATH) IMPLANT
STENT URET 6FRX24 CONTOUR (STENTS) IMPLANT
TUBING CONNECTING 10 (TUBING) ×1 IMPLANT
TUBING UROLOGY SET (TUBING) ×1 IMPLANT

## 2023-07-26 NOTE — Discharge Instructions (Addendum)
 DISCHARGE INSTRUCTIONS FOR Ureteroscopy and/or Ureteral Stent Placement  MEDICATIONS:  1.  Robaxin 2. Tamsulosin  3. Pyridium   IF you are on anticoagulation (blood thinners) you can restart it on Saturday 3/22.   ACTIVITY:  1. No strenuous activity x 1week  2. No driving while on narcotic pain medications  3. Drink plenty of water  4. Continue to walk at home - it is normal to see blood in the urine while the stent is in place, so keep active, but don't over do it.  5. May return to work/school tomorrow or when you feel ready  6. You may experience some pain when urinating in the kidney on the side that was operated on while the stent is in place this is normal  WHAT IS NORMAL TO EXPERIENCE: It is normal to feel the urge to urinate while the stent is in place It is normal to have blood in your urine while the stent is in place  It sometimes can be normal to have pain in your kidney when you urinate   BATHING:  1. You can shower and we recommend daily showers    DIET: You may return to your normal diet immediately. Because of the raw surface of your bladder, alcohol, spicy foods, acid type foods and drinks with caffeine may cause irritation or frequency and should be used in moderation. To keep your urine flowing freely and to avoid constipation, drink plenty of fluids during the day ( 8-10 glasses ). Tip: Avoid cranberry juice because it is very acidic.  SIGNS/SYMPTOMS TO CALL:  Please call us if you have a fever greater than 101.5, uncontrolled nausea/vomiting, uncontrolled pain, dizziness, unable to urinate, bloody urine with clots greater than the size of a quarter, chest pain, shortness of breath, leg swelling, leg pain, redness around wound, drainage from wound, or any other concerns or questions.   You can reach [place] at [phone].   FOLLOW-UP:  1. You you have been set up for f/u in 6-8 weeks  2. You will have your stent removed in clinic in 2 weeks

## 2023-07-26 NOTE — Op Note (Signed)
 Preoperative diagnosis: left ureteral calculus  Postoperative diagnosis: left ureteral calculus  Procedure:  Cystoscopy left ureteroscopy and stone removal Ureteroscopic laser lithotripsy left 23F x 24 ureteral stent placement no strings  Surgeon: Dr. Vilma Prader  Anesthesia: General  Complications: None  Intraoperative findings: Left distal ureteral stone fragmented and removed  the rest the ureter cleared with the semirigid ureteroscope Evidence of impaction of the distal ureter stone requiring indwelling stent without strings 6 x 24 double-J stent  without strings placed in left ureter  EBL: Minimal  Specimens: left ureteral calculus  Disposition of specimens: [facility] for stone analysis  Indication: Deanna Shea is a 78 y.o.   patient with a  left ureteral stone causing left pyelonephritis she was stented in early January and was lost to follow-up she eventually was able to schedule the surgery to have her stone removed.. After reviewing the management options for treatment, the patient elected to proceed with the above surgical procedure(s). We have discussed the potential benefits and risks of the procedure, side effects of the proposed treatment, the likelihood of the patient achieving the goals of the procedure, and any potential problems that might occur during the procedure or recuperation. Informed consent has been obtained.   Description of procedure:  The patient was taken to the operating room and general anesthesia was induced.  The patient was placed in the dorsal lithotomy position, prepped and draped in the usual sterile fashion, and preoperative antibiotics were administered. A preoperative time-out was performed.   Cystourethroscopy was performed.  [name] urethra was examined and was normal.There was no evidence for any bladder tumors, stones, or other mucosal pathology.    Attention then turned to the left ureteral  orifice.  A 0.38 sensor guidewire was then advanced up the left ureter into the renal pelvis under fluoroscopic guidance. The 6 Fr semirigid ureteroscope was then advanced into the ureter next to the guidewire and the calculus was identified.   The stone was then fragmented with the 200 micron holmium laser fiber on a setting of 0.6 and frequency of 6 Hz.   All stones were then removed from the ureter with an N-gage nitinol basket.  Reinspection of the ureter revealed no remaining visible stones or fragments.   The wire was then backloaded through the cystoscope and a ureteral stent was advance over the wire using Seldinger technique.  The stent was positioned appropriately under fluoroscopic and cystoscopic guidance.  The wire was then removed with an adequate stent curl noted in the renal pelvis as well as in the bladder.  The bladder was then emptied and the procedure ended.  The patient appeared to tolerate the procedure well and without complications.  The patient was able to be awakened and transferred to the recovery unit in satisfactory condition.   Disposition: The patient has been scheduled for followup in 6 weeks with a renal ultrasound.  The patient will be scheduled for stent removal in 2 weeks in our clinic

## 2023-07-26 NOTE — Anesthesia Preprocedure Evaluation (Signed)
 Anesthesia Evaluation  Patient identified by MRN, date of birth, ID band Patient awake    Reviewed: Allergy & Precautions, NPO status , Patient's Chart, lab work & pertinent test results  Airway Mallampati: II  TM Distance: >3 FB Neck ROM: Full    Dental no notable dental hx.    Pulmonary neg pulmonary ROS   Pulmonary exam normal        Cardiovascular hypertension, Pt. on medications and Pt. on home beta blockers + dysrhythmias Atrial Fibrillation + Valvular Problems/Murmurs MVP  Rhythm:Regular Rate:Normal     Neuro/Psych   Anxiety   Schizophrenia  negative neurological ROS     GI/Hepatic negative GI ROS, Neg liver ROS,,,  Endo/Other  diabetes, Type 2, Oral Hypoglycemic AgentsHypothyroidism    Renal/GU Renal disease  negative genitourinary   Musculoskeletal negative musculoskeletal ROS (+)    Abdominal Normal abdominal exam  (+)   Peds  Hematology   Anesthesia Other Findings   Reproductive/Obstetrics                             Anesthesia Physical Anesthesia Plan  ASA: 3  Anesthesia Plan: General   Post-op Pain Management:    Induction: Intravenous  PONV Risk Score and Plan: 3 and Ondansetron, Dexamethasone and Treatment may vary due to age or medical condition  Airway Management Planned: Mask and LMA  Additional Equipment: None  Intra-op Plan:   Post-operative Plan: Extubation in OR  Informed Consent: I have reviewed the patients History and Physical, chart, labs and discussed the procedure including the risks, benefits and alternatives for the proposed anesthesia with the patient or authorized representative who has indicated his/her understanding and acceptance.     Dental advisory given  Plan Discussed with: CRNA  Anesthesia Plan Comments:        Anesthesia Quick Evaluation

## 2023-07-26 NOTE — Transfer of Care (Signed)
 Immediate Anesthesia Transfer of Care Note  Patient: Deanna Shea  Procedure(s) Performed: CYSTOSCOPY/URETEROSCOPY/HOLMIUM LASER/STENT PLACEMENT (Left)  Patient Location: PACU  Anesthesia Type:General  Level of Consciousness: awake and alert   Airway & Oxygen Therapy: Patient Spontanous Breathing and Patient connected to face mask oxygen  Post-op Assessment: Report given to RN and Post -op Vital signs reviewed and stable  Post vital signs: Reviewed and stable  Last Vitals:  Vitals Value Taken Time  BP 126/53 07/26/23 1824  Temp    Pulse 66 07/26/23 1825  Resp 13 07/26/23 1825  SpO2 100 % 07/26/23 1825  Vitals shown include unfiled device data.  Last Pain:  Vitals:   07/26/23 1308  TempSrc: Oral         Complications: No notable events documented.

## 2023-07-26 NOTE — Anesthesia Procedure Notes (Signed)
 Procedure Name: LMA Insertion Date/Time: 07/26/2023 5:52 PM  Performed by: Deri Fuelling, CRNAPre-anesthesia Checklist: Patient identified, Emergency Drugs available, Suction available and Patient being monitored Patient Re-evaluated:Patient Re-evaluated prior to induction Oxygen Delivery Method: Circle system utilized Preoxygenation: Pre-oxygenation with 100% oxygen Induction Type: IV induction Ventilation: Mask ventilation without difficulty LMA: LMA flexible inserted LMA Size: 4.0 Tube type: Oral Number of attempts: 1 Placement Confirmation: positive ETCO2 and breath sounds checked- equal and bilateral Tube secured with: Tape Dental Injury: Teeth and Oropharynx as per pre-operative assessment

## 2023-07-26 NOTE — OR Nursing (Signed)
 Stent removed on left side

## 2023-07-27 ENCOUNTER — Encounter (HOSPITAL_COMMUNITY): Payer: Self-pay | Admitting: Urology

## 2023-07-27 DIAGNOSIS — M6281 Muscle weakness (generalized): Secondary | ICD-10-CM | POA: Diagnosis not present

## 2023-07-27 DIAGNOSIS — R2681 Unsteadiness on feet: Secondary | ICD-10-CM | POA: Diagnosis not present

## 2023-07-30 DIAGNOSIS — M6281 Muscle weakness (generalized): Secondary | ICD-10-CM | POA: Diagnosis not present

## 2023-07-30 DIAGNOSIS — R2681 Unsteadiness on feet: Secondary | ICD-10-CM | POA: Diagnosis not present

## 2023-07-31 NOTE — Anesthesia Postprocedure Evaluation (Signed)
 Anesthesia Post Note  Patient: Deanna Shea  Procedure(s) Performed: CYSTOSCOPY/URETEROSCOPY/HOLMIUM LASER/STENT PLACEMENT (Left)     Patient location during evaluation: PACU Anesthesia Type: General Level of consciousness: awake and alert Pain management: pain level controlled Vital Signs Assessment: post-procedure vital signs reviewed and stable Respiratory status: spontaneous breathing, nonlabored ventilation, respiratory function stable and patient connected to nasal cannula oxygen Cardiovascular status: blood pressure returned to baseline and stable Postop Assessment: no apparent nausea or vomiting Anesthetic complications: no   No notable events documented.  Last Vitals:  Vitals:   07/26/23 1930 07/26/23 1945  BP: (!) 139/58 137/67  Pulse: 70 73  Resp: 15 20  Temp:  36.7 C  SpO2: 95% 94%    Last Pain:  Vitals:   07/26/23 1956  TempSrc:   PainSc: 0-No pain                 Earl Lites P Jadence Kinlaw

## 2023-08-01 ENCOUNTER — Ambulatory Visit (HOSPITAL_COMMUNITY)
Admission: RE | Admit: 2023-08-01 | Discharge: 2023-08-01 | Disposition: A | Discharge status: Home / Self Care | Source: Ambulatory Visit | Attending: Infectious Diseases | Admitting: Urology

## 2023-08-01 DIAGNOSIS — N151 Renal and perinephric abscess: Secondary | ICD-10-CM

## 2023-08-01 DIAGNOSIS — N201 Calculus of ureter: Secondary | ICD-10-CM | POA: Diagnosis not present

## 2023-08-01 DIAGNOSIS — M8580 Other specified disorders of bone density and structure, unspecified site: Secondary | ICD-10-CM | POA: Diagnosis not present

## 2023-08-01 DIAGNOSIS — Z87442 Personal history of urinary calculi: Secondary | ICD-10-CM | POA: Diagnosis not present

## 2023-08-01 DIAGNOSIS — I4891 Unspecified atrial fibrillation: Secondary | ICD-10-CM | POA: Diagnosis not present

## 2023-08-01 DIAGNOSIS — K8020 Calculus of gallbladder without cholecystitis without obstruction: Secondary | ICD-10-CM | POA: Diagnosis not present

## 2023-08-01 DIAGNOSIS — N200 Calculus of kidney: Secondary | ICD-10-CM | POA: Diagnosis not present

## 2023-08-01 DIAGNOSIS — E119 Type 2 diabetes mellitus without complications: Secondary | ICD-10-CM | POA: Diagnosis not present

## 2023-08-01 MED ORDER — IOHEXOL 9 MG/ML PO SOLN
1000.0000 mL | Freq: Once | ORAL | Status: AC
  Administered 2023-08-01: 1000 mL via ORAL

## 2023-08-01 MED ORDER — IOHEXOL 300 MG/ML  SOLN
100.0000 mL | Freq: Once | INTRAMUSCULAR | Status: AC | PRN
  Administered 2023-08-01: 100 mL via INTRAVENOUS

## 2023-08-02 DIAGNOSIS — M6281 Muscle weakness (generalized): Secondary | ICD-10-CM | POA: Diagnosis not present

## 2023-08-02 LAB — CALCULI, WITH PHOTOGRAPH (CLINICAL LAB)
Calcium Oxalate Dihydrate: 20 %
Calcium Oxalate Monohydrate: 80 %
Weight Calculi: 202 mg

## 2023-08-03 DIAGNOSIS — M6281 Muscle weakness (generalized): Secondary | ICD-10-CM | POA: Diagnosis not present

## 2023-08-03 DIAGNOSIS — R2681 Unsteadiness on feet: Secondary | ICD-10-CM | POA: Diagnosis not present

## 2023-08-06 DIAGNOSIS — R2681 Unsteadiness on feet: Secondary | ICD-10-CM | POA: Diagnosis not present

## 2023-08-06 DIAGNOSIS — M6281 Muscle weakness (generalized): Secondary | ICD-10-CM | POA: Diagnosis not present

## 2023-08-07 DIAGNOSIS — M6281 Muscle weakness (generalized): Secondary | ICD-10-CM | POA: Diagnosis not present

## 2023-08-08 DIAGNOSIS — M6281 Muscle weakness (generalized): Secondary | ICD-10-CM | POA: Diagnosis not present

## 2023-08-08 DIAGNOSIS — N201 Calculus of ureter: Secondary | ICD-10-CM | POA: Diagnosis not present

## 2023-08-08 DIAGNOSIS — R2681 Unsteadiness on feet: Secondary | ICD-10-CM | POA: Diagnosis not present

## 2023-08-09 DIAGNOSIS — M6281 Muscle weakness (generalized): Secondary | ICD-10-CM | POA: Diagnosis not present

## 2023-08-09 DIAGNOSIS — R2681 Unsteadiness on feet: Secondary | ICD-10-CM | POA: Diagnosis not present

## 2023-08-10 DIAGNOSIS — M6281 Muscle weakness (generalized): Secondary | ICD-10-CM | POA: Diagnosis not present

## 2023-08-13 DIAGNOSIS — R2681 Unsteadiness on feet: Secondary | ICD-10-CM | POA: Diagnosis not present

## 2023-08-13 DIAGNOSIS — M6281 Muscle weakness (generalized): Secondary | ICD-10-CM | POA: Diagnosis not present

## 2023-08-14 ENCOUNTER — Encounter: Payer: Self-pay | Admitting: Infectious Diseases

## 2023-08-14 ENCOUNTER — Ambulatory Visit: Admitting: Infectious Diseases

## 2023-08-14 DIAGNOSIS — M6281 Muscle weakness (generalized): Secondary | ICD-10-CM | POA: Diagnosis not present

## 2023-08-15 DIAGNOSIS — M6281 Muscle weakness (generalized): Secondary | ICD-10-CM | POA: Diagnosis not present

## 2023-08-15 DIAGNOSIS — R2681 Unsteadiness on feet: Secondary | ICD-10-CM | POA: Diagnosis not present

## 2023-08-16 DIAGNOSIS — M6281 Muscle weakness (generalized): Secondary | ICD-10-CM | POA: Diagnosis not present

## 2023-08-16 DIAGNOSIS — R2681 Unsteadiness on feet: Secondary | ICD-10-CM | POA: Diagnosis not present

## 2023-08-17 DIAGNOSIS — M6281 Muscle weakness (generalized): Secondary | ICD-10-CM | POA: Diagnosis not present

## 2023-08-19 DIAGNOSIS — R2681 Unsteadiness on feet: Secondary | ICD-10-CM | POA: Diagnosis not present

## 2023-08-19 DIAGNOSIS — M6281 Muscle weakness (generalized): Secondary | ICD-10-CM | POA: Diagnosis not present

## 2023-08-20 ENCOUNTER — Other Ambulatory Visit: Payer: Self-pay

## 2023-08-20 DIAGNOSIS — M6281 Muscle weakness (generalized): Secondary | ICD-10-CM | POA: Diagnosis not present

## 2023-08-21 DIAGNOSIS — M6281 Muscle weakness (generalized): Secondary | ICD-10-CM | POA: Diagnosis not present

## 2023-08-22 DIAGNOSIS — M6281 Muscle weakness (generalized): Secondary | ICD-10-CM | POA: Diagnosis not present

## 2023-08-23 DIAGNOSIS — M6281 Muscle weakness (generalized): Secondary | ICD-10-CM | POA: Diagnosis not present

## 2023-08-23 DIAGNOSIS — R2681 Unsteadiness on feet: Secondary | ICD-10-CM | POA: Diagnosis not present

## 2023-08-24 DIAGNOSIS — R2681 Unsteadiness on feet: Secondary | ICD-10-CM | POA: Diagnosis not present

## 2023-08-24 DIAGNOSIS — M6281 Muscle weakness (generalized): Secondary | ICD-10-CM | POA: Diagnosis not present

## 2023-08-29 DIAGNOSIS — R2681 Unsteadiness on feet: Secondary | ICD-10-CM | POA: Diagnosis not present

## 2023-08-29 DIAGNOSIS — M6281 Muscle weakness (generalized): Secondary | ICD-10-CM | POA: Diagnosis not present

## 2023-08-30 DIAGNOSIS — R2681 Unsteadiness on feet: Secondary | ICD-10-CM | POA: Diagnosis not present

## 2023-08-30 DIAGNOSIS — M6281 Muscle weakness (generalized): Secondary | ICD-10-CM | POA: Diagnosis not present

## 2023-08-31 DIAGNOSIS — M6281 Muscle weakness (generalized): Secondary | ICD-10-CM | POA: Diagnosis not present

## 2023-09-03 ENCOUNTER — Ambulatory Visit: Admitting: Infectious Diseases

## 2023-09-04 DIAGNOSIS — M6281 Muscle weakness (generalized): Secondary | ICD-10-CM | POA: Diagnosis not present

## 2023-09-04 DIAGNOSIS — R2681 Unsteadiness on feet: Secondary | ICD-10-CM | POA: Diagnosis not present

## 2023-09-05 DIAGNOSIS — R2681 Unsteadiness on feet: Secondary | ICD-10-CM | POA: Diagnosis not present

## 2023-09-05 DIAGNOSIS — M6281 Muscle weakness (generalized): Secondary | ICD-10-CM | POA: Diagnosis not present

## 2023-09-06 DIAGNOSIS — M6281 Muscle weakness (generalized): Secondary | ICD-10-CM | POA: Diagnosis not present

## 2023-09-06 DIAGNOSIS — R2681 Unsteadiness on feet: Secondary | ICD-10-CM | POA: Diagnosis not present

## 2023-09-07 DIAGNOSIS — R2681 Unsteadiness on feet: Secondary | ICD-10-CM | POA: Diagnosis not present

## 2023-09-07 DIAGNOSIS — M6281 Muscle weakness (generalized): Secondary | ICD-10-CM | POA: Diagnosis not present

## 2023-09-10 DIAGNOSIS — M6281 Muscle weakness (generalized): Secondary | ICD-10-CM | POA: Diagnosis not present

## 2023-09-10 DIAGNOSIS — R2681 Unsteadiness on feet: Secondary | ICD-10-CM | POA: Diagnosis not present

## 2023-09-12 DIAGNOSIS — M6281 Muscle weakness (generalized): Secondary | ICD-10-CM | POA: Diagnosis not present

## 2023-09-13 DIAGNOSIS — M6281 Muscle weakness (generalized): Secondary | ICD-10-CM | POA: Diagnosis not present

## 2023-09-13 DIAGNOSIS — R2681 Unsteadiness on feet: Secondary | ICD-10-CM | POA: Diagnosis not present

## 2023-09-14 DIAGNOSIS — M6281 Muscle weakness (generalized): Secondary | ICD-10-CM | POA: Diagnosis not present

## 2023-09-14 DIAGNOSIS — R2681 Unsteadiness on feet: Secondary | ICD-10-CM | POA: Diagnosis not present

## 2023-09-17 DIAGNOSIS — R2681 Unsteadiness on feet: Secondary | ICD-10-CM | POA: Diagnosis not present

## 2023-09-17 DIAGNOSIS — K6289 Other specified diseases of anus and rectum: Secondary | ICD-10-CM | POA: Diagnosis not present

## 2023-09-17 DIAGNOSIS — M6281 Muscle weakness (generalized): Secondary | ICD-10-CM | POA: Diagnosis not present

## 2023-09-18 DIAGNOSIS — E119 Type 2 diabetes mellitus without complications: Secondary | ICD-10-CM | POA: Diagnosis not present

## 2023-09-18 DIAGNOSIS — B351 Tinea unguium: Secondary | ICD-10-CM | POA: Diagnosis not present

## 2023-09-18 DIAGNOSIS — M79675 Pain in left toe(s): Secondary | ICD-10-CM | POA: Diagnosis not present

## 2023-09-19 DIAGNOSIS — M6281 Muscle weakness (generalized): Secondary | ICD-10-CM | POA: Diagnosis not present

## 2023-09-20 DIAGNOSIS — R2681 Unsteadiness on feet: Secondary | ICD-10-CM | POA: Diagnosis not present

## 2023-09-20 DIAGNOSIS — M6281 Muscle weakness (generalized): Secondary | ICD-10-CM | POA: Diagnosis not present

## 2023-09-24 DIAGNOSIS — R2681 Unsteadiness on feet: Secondary | ICD-10-CM | POA: Diagnosis not present

## 2023-09-24 DIAGNOSIS — M6281 Muscle weakness (generalized): Secondary | ICD-10-CM | POA: Diagnosis not present

## 2023-09-26 DIAGNOSIS — M6281 Muscle weakness (generalized): Secondary | ICD-10-CM | POA: Diagnosis not present

## 2023-09-26 DIAGNOSIS — R2681 Unsteadiness on feet: Secondary | ICD-10-CM | POA: Diagnosis not present

## 2023-09-27 DIAGNOSIS — R2681 Unsteadiness on feet: Secondary | ICD-10-CM | POA: Diagnosis not present

## 2023-09-27 DIAGNOSIS — M6281 Muscle weakness (generalized): Secondary | ICD-10-CM | POA: Diagnosis not present

## 2023-09-28 DIAGNOSIS — R2681 Unsteadiness on feet: Secondary | ICD-10-CM | POA: Diagnosis not present

## 2023-09-28 DIAGNOSIS — M6281 Muscle weakness (generalized): Secondary | ICD-10-CM | POA: Diagnosis not present

## 2023-10-02 DIAGNOSIS — M6281 Muscle weakness (generalized): Secondary | ICD-10-CM | POA: Diagnosis not present

## 2023-10-03 DIAGNOSIS — M6281 Muscle weakness (generalized): Secondary | ICD-10-CM | POA: Diagnosis not present

## 2023-10-03 DIAGNOSIS — R2681 Unsteadiness on feet: Secondary | ICD-10-CM | POA: Diagnosis not present

## 2023-10-05 DIAGNOSIS — M6281 Muscle weakness (generalized): Secondary | ICD-10-CM | POA: Diagnosis not present

## 2023-10-05 DIAGNOSIS — R2681 Unsteadiness on feet: Secondary | ICD-10-CM | POA: Diagnosis not present

## 2023-10-08 DIAGNOSIS — M6281 Muscle weakness (generalized): Secondary | ICD-10-CM | POA: Diagnosis not present

## 2023-10-08 DIAGNOSIS — R2681 Unsteadiness on feet: Secondary | ICD-10-CM | POA: Diagnosis not present

## 2023-10-10 DIAGNOSIS — M6281 Muscle weakness (generalized): Secondary | ICD-10-CM | POA: Diagnosis not present

## 2023-10-12 DIAGNOSIS — M6281 Muscle weakness (generalized): Secondary | ICD-10-CM | POA: Diagnosis not present

## 2023-10-17 DIAGNOSIS — M6281 Muscle weakness (generalized): Secondary | ICD-10-CM | POA: Diagnosis not present

## 2023-10-19 DIAGNOSIS — M6281 Muscle weakness (generalized): Secondary | ICD-10-CM | POA: Diagnosis not present

## 2023-10-24 DIAGNOSIS — M6281 Muscle weakness (generalized): Secondary | ICD-10-CM | POA: Diagnosis not present

## 2023-10-31 DIAGNOSIS — M6281 Muscle weakness (generalized): Secondary | ICD-10-CM | POA: Diagnosis not present

## 2023-11-05 DIAGNOSIS — E7800 Pure hypercholesterolemia, unspecified: Secondary | ICD-10-CM | POA: Diagnosis not present

## 2023-11-05 DIAGNOSIS — I480 Paroxysmal atrial fibrillation: Secondary | ICD-10-CM | POA: Diagnosis not present

## 2023-11-05 DIAGNOSIS — F321 Major depressive disorder, single episode, moderate: Secondary | ICD-10-CM | POA: Diagnosis not present

## 2023-11-05 DIAGNOSIS — E039 Hypothyroidism, unspecified: Secondary | ICD-10-CM | POA: Diagnosis not present

## 2023-11-07 DIAGNOSIS — M6281 Muscle weakness (generalized): Secondary | ICD-10-CM | POA: Diagnosis not present

## 2023-11-08 ENCOUNTER — Encounter (HOSPITAL_COMMUNITY): Payer: Self-pay | Admitting: Emergency Medicine

## 2023-11-08 ENCOUNTER — Other Ambulatory Visit: Payer: Self-pay

## 2023-11-08 ENCOUNTER — Emergency Department (HOSPITAL_COMMUNITY)
Admission: EM | Admit: 2023-11-08 | Discharge: 2023-11-09 | Disposition: A | Discharge status: Other Acute Inpatient Hospital | Attending: Student | Admitting: Student

## 2023-11-08 DIAGNOSIS — R42 Dizziness and giddiness: Secondary | ICD-10-CM | POA: Diagnosis not present

## 2023-11-08 DIAGNOSIS — N3000 Acute cystitis without hematuria: Secondary | ICD-10-CM | POA: Insufficient documentation

## 2023-11-08 DIAGNOSIS — R61 Generalized hyperhidrosis: Secondary | ICD-10-CM | POA: Diagnosis not present

## 2023-11-08 DIAGNOSIS — R739 Hyperglycemia, unspecified: Secondary | ICD-10-CM | POA: Insufficient documentation

## 2023-11-08 DIAGNOSIS — R509 Fever, unspecified: Secondary | ICD-10-CM | POA: Diagnosis not present

## 2023-11-08 NOTE — ED Provider Notes (Signed)
 [facility] Emergency Department Provider Note MRN:  [id number]  Arrival date & time: 11/09/23     Chief Complaint   Fever   History of Present Illness   Deanna Shea is a 78 y.o. year-old adult presents to the ED with chief complaint of fever.  She reports running fevers for the past 3 to 4 days.  She reports associated sinus congestion and pressure.  She denies sore throat or cough.  Denies any abdominal pain.  Denies dysuria.  Denies any known sick contacts.  She states that she has taken Tylenol  for her fever with good relief.  She denies any other associated symptoms.  History provided by patient.   Review of Systems  Pertinent positive and negative review of systems noted in HPI.    Physical Exam   Vitals:   11/08/23 2230 11/09/23 0111  BP: (!) 111/52 (!) 110/38  Pulse: 66 65  Resp: 16 18  Temp: 97.8 F (36.6 C) (!) 97 F (36.1 C)  SpO2: 95% 95%    CONSTITUTIONAL: Nontoxic-appearing, NAD NEURO:  Alert and oriented x 3, CN 3-12 grossly intact EYES:  eyes equal and reactive ENT/NECK:  Supple, no stridor CARDIO: Normal rate, regular rhythm, appears well-perfused PULM:  No respiratory distress, scattered rales GI/GU:  non-distended, no focal abdominal tenderness MSK/SPINE:  No gross deformities, no edema, moves all extremities SKIN:  no rash,, atraumatic   *Additional and/or pertinent findings included in MDM below  Diagnostic and Interventional Summary    EKG Interpretation Date/Time:    Ventricular Rate:    PR Interval:    QRS Duration:    QT Interval:    QTC Calculation:   R Axis:      Text Interpretation:         Labs Reviewed  BASIC METABOLIC PANEL WITH GFR - Abnormal; Notable for the following components:      Result Value   Sodium 134 (*)    Glucose, Bld 258 (*)    All other components within normal limits  URINALYSIS, ROUTINE W REFLEX MICROSCOPIC - Abnormal; Notable for the following components:   APPearance HAZY  (*)    Glucose, UA 50 (*)    Hgb urine dipstick SMALL (*)    Protein, ur 30 (*)    Leukocytes,Ua LARGE (*)    Bacteria, UA MANY (*)    All other components within normal limits  RESP PANEL BY RT-PCR (RSV, FLU A&B, COVID)  [id number]  CBC WITH DIFFERENTIAL/PLATELET    DG Chest 2 View  Final Result      Medications  cefTRIAXone  (ROCEPHIN ) 1 g in sodium chloride  0.9 % 100 mL IVPB (1 g Intravenous New Bag/Given 11/09/23 0315)     Procedures  /  Critical Care Procedures  ED Course and Medical Decision Making  I have reviewed the triage vital signs, the nursing notes, and pertinent available records from the EMR.  Social Determinants Affecting Complexity of Care: Patient has no clinically significant social determinants affecting this chief complaint..   ED Course: Clinical Course as of 11/09/23 0341  Fri Nov 09, 2023  0340 Basic metabolic panel(!) No significant electrolyte abnormality, mild hyperglycemia [RB]  0340 CBC with Differential No marked leukocytosis or anemia [RB]  0340 Urinalysis, Routine w reflex microscopic -Urine, Clean Catch(!) Urinalysis is consistent with infection, will treat with IV Rocephin  and discharged home on Keflex  [RB]  0341 Resp panel by RT-PCR (RSV, Flu A&B, Covid) Anterior Nasal Swab Negative [RB]  0341 DG Chest  2 View No obvious opacity or effusion [RB]    Clinical Course User Index [RB] Vicky Charleston, PA-C    Medical Decision Making Patient here with reported fevers for the past couple of days.  She reports some associated chills as well.  She is concerned because she has some sinus congestion.  Vitals are reassuring here.  COVID and flu tests are negative.  Problems Addressed: Acute cystitis without hematuria: acute illness or injury    Details: UTI is thought to be the source of patient's fever.  Will treat with IV Rocephin  in the ED and discharged home on Keflex .  Amount and/or Complexity of Data Reviewed Labs: ordered.  Decision-making details documented in ED Course. Radiology: ordered and independent interpretation performed. Decision-making details documented in ED Course.  Risk Prescription drug management.         Consultants: No consultations were needed in caring for this patient.   Treatment and Plan: I considered admission due to patient's initial presentation, but after considering the examination and diagnostic results, patient will not require admission and can be discharged with outpatient follow-up.    Final Clinical Impressions(s) / ED Diagnoses     ICD-10-CM   1. Acute cystitis without hematuria  N30.00       ED Discharge Orders          Ordered    cephALEXin  (KEFLEX ) 500 MG capsule  2 times daily        11/09/23 9660              Discharge Instructions Discussed with and Provided to Patient:     Discharge Instructions      You have been diagnosed with a urinary tract infection.  I believe this to be the cause of your fever.  You can continue to take Tylenol  or Motrin  for the fever.  Please take antibiotics as directed.  Please return for new or worsening symptoms.  Please follow-up with your doctor next week.       Vicky Charleston, PA-C 11/09/23 9658    Albertina Dixon, MD 11/09/23 2350

## 2023-11-08 NOTE — ED Triage Notes (Signed)
 Pt via [facility] from [facility] c/o fever up to 101 x 2 days with sinus congestion, chills, dizziness, fatigue. Denies n/v/d. Tylenol  325mg  given at 2016pm. A/O x 4.   T 98.2 en route BP 130/70 HR 72 O2 94% RA CBG 201

## 2023-11-09 ENCOUNTER — Emergency Department (HOSPITAL_COMMUNITY)

## 2023-11-09 DIAGNOSIS — R509 Fever, unspecified: Secondary | ICD-10-CM | POA: Diagnosis not present

## 2023-11-09 DIAGNOSIS — Z7401 Bed confinement status: Secondary | ICD-10-CM | POA: Diagnosis not present

## 2023-11-09 LAB — CBC WITH DIFFERENTIAL/PLATELET
Abs Immature Granulocytes: 0.03 K/uL (ref 0.00–0.07)
Basophils Absolute: 0 K/uL (ref 0.0–0.1)
Basophils Relative: 1 %
Eosinophils Absolute: 0.1 K/uL (ref 0.0–0.5)
Eosinophils Relative: 2 %
HCT: 40.9 % (ref 36.0–46.0)
Hemoglobin: 13.6 g/dL (ref 12.0–15.0)
Immature Granulocytes: 0 %
Lymphocytes Relative: 21 %
Lymphs Abs: 1.8 K/uL (ref 0.7–4.0)
MCH: 30 pg (ref 26.0–34.0)
MCHC: 33.3 g/dL (ref 30.0–36.0)
MCV: 90.3 fL (ref 80.0–100.0)
Monocytes Absolute: 0.9 K/uL (ref 0.1–1.0)
Monocytes Relative: 11 %
Neutro Abs: 5.6 K/uL (ref 1.7–7.7)
Neutrophils Relative %: 65 %
Platelets: 211 K/uL (ref 150–400)
RBC: 4.53 MIL/uL (ref 3.87–5.11)
RDW: 13 % (ref 11.5–15.5)
WBC: 8.5 K/uL (ref 4.0–10.5)
nRBC: 0 % (ref 0.0–0.2)

## 2023-11-09 LAB — URINALYSIS, ROUTINE W REFLEX MICROSCOPIC
Bilirubin Urine: NEGATIVE
Glucose, UA: 50 mg/dL — AB
Ketones, ur: NEGATIVE mg/dL
Nitrite: NEGATIVE
Protein, ur: 30 mg/dL — AB
Specific Gravity, Urine: 1.01 (ref 1.005–1.030)
WBC, UA: >50 WBC/hpf (ref 0–5)
pH: 6 (ref 5.0–8.0)

## 2023-11-09 LAB — BASIC METABOLIC PANEL WITH GFR
Anion gap: 12 (ref 5–15)
BUN: 14 mg/dL (ref 8–23)
CO2: 22 mmol/L (ref 22–32)
Calcium: 9.2 mg/dL (ref 8.9–10.3)
Chloride: 100 mmol/L (ref 98–111)
Creatinine, Ser: 0.79 mg/dL (ref 0.44–1.00)
GFR, Estimated: >60 mL/min (ref >60)
Glucose, Bld: 258 mg/dL — ABNORMAL HIGH (ref 70–99)
Potassium: 3.7 mmol/L (ref 3.5–5.1)
Sodium: 134 mmol/L — ABNORMAL LOW (ref 135–145)

## 2023-11-09 LAB — RESP PANEL BY RT-PCR (RSV, FLU A&B, COVID)  RVPGX2
Influenza A by PCR: NEGATIVE
Influenza B by PCR: NEGATIVE
Resp Syncytial Virus by PCR: NEGATIVE
SARS Coronavirus 2 by RT PCR: NEGATIVE

## 2023-11-09 MED ORDER — SODIUM CHLORIDE 0.9 % IV SOLN
1.0000 g | Freq: Once | INTRAVENOUS | Status: AC
  Administered 2023-11-09: 1 g via INTRAVENOUS
  Filled 2023-11-09: qty 10

## 2023-11-09 MED ORDER — CEPHALEXIN 500 MG PO CAPS: 500.0000 mg | ORAL_CAPSULE | Freq: Two times a day (BID) | ORAL | 0 refills | Status: DC

## 2023-11-09 NOTE — ED Notes (Signed)
 Pt in xray

## 2023-11-09 NOTE — Discharge Instructions (Addendum)
 You have been diagnosed with a urinary tract infection.  I believe this to be the cause of your fever.  You can continue to take Tylenol  or Motrin  for the fever.  Please take antibiotics as directed.  Please return for new or worsening symptoms.  Please follow-up with your doctor next week.

## 2023-11-09 NOTE — ED Notes (Signed)
[facility] CALLED; NO ETA GIVEN ?

## 2023-11-09 NOTE — ED Notes (Signed)
 Patient transported to X-ray

## 2023-11-12 DIAGNOSIS — F2089 Other schizophrenia: Secondary | ICD-10-CM | POA: Diagnosis not present

## 2023-11-12 DIAGNOSIS — R2681 Unsteadiness on feet: Secondary | ICD-10-CM | POA: Diagnosis not present

## 2023-11-12 DIAGNOSIS — M62512 Muscle wasting and atrophy, not elsewhere classified, left shoulder: Secondary | ICD-10-CM | POA: Diagnosis not present

## 2023-11-12 DIAGNOSIS — R296 Repeated falls: Secondary | ICD-10-CM | POA: Diagnosis not present

## 2023-11-12 DIAGNOSIS — R41841 Cognitive communication deficit: Secondary | ICD-10-CM | POA: Diagnosis not present

## 2023-11-13 DIAGNOSIS — R262 Difficulty in walking, not elsewhere classified: Secondary | ICD-10-CM | POA: Diagnosis not present

## 2023-11-13 DIAGNOSIS — F2089 Other schizophrenia: Secondary | ICD-10-CM | POA: Diagnosis not present

## 2023-11-13 DIAGNOSIS — R41841 Cognitive communication deficit: Secondary | ICD-10-CM | POA: Diagnosis not present

## 2023-11-13 DIAGNOSIS — R278 Other lack of coordination: Secondary | ICD-10-CM | POA: Diagnosis not present

## 2023-11-14 DIAGNOSIS — R2681 Unsteadiness on feet: Secondary | ICD-10-CM | POA: Diagnosis not present

## 2023-11-14 DIAGNOSIS — M62512 Muscle wasting and atrophy, not elsewhere classified, left shoulder: Secondary | ICD-10-CM | POA: Diagnosis not present

## 2023-11-14 DIAGNOSIS — R262 Difficulty in walking, not elsewhere classified: Secondary | ICD-10-CM | POA: Diagnosis not present

## 2023-11-14 DIAGNOSIS — R296 Repeated falls: Secondary | ICD-10-CM | POA: Diagnosis not present

## 2023-11-14 DIAGNOSIS — R278 Other lack of coordination: Secondary | ICD-10-CM | POA: Diagnosis not present

## 2023-11-15 DIAGNOSIS — R2681 Unsteadiness on feet: Secondary | ICD-10-CM | POA: Diagnosis not present

## 2023-11-15 DIAGNOSIS — M62512 Muscle wasting and atrophy, not elsewhere classified, left shoulder: Secondary | ICD-10-CM | POA: Diagnosis not present

## 2023-11-15 DIAGNOSIS — R296 Repeated falls: Secondary | ICD-10-CM | POA: Diagnosis not present

## 2023-11-16 DIAGNOSIS — R278 Other lack of coordination: Secondary | ICD-10-CM | POA: Diagnosis not present

## 2023-11-16 DIAGNOSIS — R262 Difficulty in walking, not elsewhere classified: Secondary | ICD-10-CM | POA: Diagnosis not present

## 2023-11-18 DIAGNOSIS — R262 Difficulty in walking, not elsewhere classified: Secondary | ICD-10-CM | POA: Diagnosis not present

## 2023-11-18 DIAGNOSIS — R278 Other lack of coordination: Secondary | ICD-10-CM | POA: Diagnosis not present

## 2023-11-19 DIAGNOSIS — R278 Other lack of coordination: Secondary | ICD-10-CM | POA: Diagnosis not present

## 2023-11-19 DIAGNOSIS — R2681 Unsteadiness on feet: Secondary | ICD-10-CM | POA: Diagnosis not present

## 2023-11-19 DIAGNOSIS — R296 Repeated falls: Secondary | ICD-10-CM | POA: Diagnosis not present

## 2023-11-19 DIAGNOSIS — R262 Difficulty in walking, not elsewhere classified: Secondary | ICD-10-CM | POA: Diagnosis not present

## 2023-11-19 DIAGNOSIS — M62512 Muscle wasting and atrophy, not elsewhere classified, left shoulder: Secondary | ICD-10-CM | POA: Diagnosis not present

## 2023-11-20 DIAGNOSIS — R2681 Unsteadiness on feet: Secondary | ICD-10-CM | POA: Diagnosis not present

## 2023-11-20 DIAGNOSIS — R41841 Cognitive communication deficit: Secondary | ICD-10-CM | POA: Diagnosis not present

## 2023-11-20 DIAGNOSIS — M62512 Muscle wasting and atrophy, not elsewhere classified, left shoulder: Secondary | ICD-10-CM | POA: Diagnosis not present

## 2023-11-20 DIAGNOSIS — R296 Repeated falls: Secondary | ICD-10-CM | POA: Diagnosis not present

## 2023-11-20 DIAGNOSIS — F2089 Other schizophrenia: Secondary | ICD-10-CM | POA: Diagnosis not present

## 2023-11-22 DIAGNOSIS — R41841 Cognitive communication deficit: Secondary | ICD-10-CM | POA: Diagnosis not present

## 2023-11-22 DIAGNOSIS — F2089 Other schizophrenia: Secondary | ICD-10-CM | POA: Diagnosis not present

## 2023-11-23 DIAGNOSIS — M62512 Muscle wasting and atrophy, not elsewhere classified, left shoulder: Secondary | ICD-10-CM | POA: Diagnosis not present

## 2023-11-23 DIAGNOSIS — R296 Repeated falls: Secondary | ICD-10-CM | POA: Diagnosis not present

## 2023-11-23 DIAGNOSIS — R2681 Unsteadiness on feet: Secondary | ICD-10-CM | POA: Diagnosis not present

## 2023-11-26 DIAGNOSIS — F2089 Other schizophrenia: Secondary | ICD-10-CM | POA: Diagnosis not present

## 2023-11-26 DIAGNOSIS — M62512 Muscle wasting and atrophy, not elsewhere classified, left shoulder: Secondary | ICD-10-CM | POA: Diagnosis not present

## 2023-11-26 DIAGNOSIS — R296 Repeated falls: Secondary | ICD-10-CM | POA: Diagnosis not present

## 2023-11-26 DIAGNOSIS — R2681 Unsteadiness on feet: Secondary | ICD-10-CM | POA: Diagnosis not present

## 2023-11-26 DIAGNOSIS — R41841 Cognitive communication deficit: Secondary | ICD-10-CM | POA: Diagnosis not present

## 2023-11-27 DIAGNOSIS — R278 Other lack of coordination: Secondary | ICD-10-CM | POA: Diagnosis not present

## 2023-11-27 DIAGNOSIS — R262 Difficulty in walking, not elsewhere classified: Secondary | ICD-10-CM | POA: Diagnosis not present

## 2023-11-27 DIAGNOSIS — F2089 Other schizophrenia: Secondary | ICD-10-CM | POA: Diagnosis not present

## 2023-11-27 DIAGNOSIS — R41841 Cognitive communication deficit: Secondary | ICD-10-CM | POA: Diagnosis not present

## 2023-11-28 DIAGNOSIS — R262 Difficulty in walking, not elsewhere classified: Secondary | ICD-10-CM | POA: Diagnosis not present

## 2023-11-28 DIAGNOSIS — R278 Other lack of coordination: Secondary | ICD-10-CM | POA: Diagnosis not present

## 2023-11-28 DIAGNOSIS — R2681 Unsteadiness on feet: Secondary | ICD-10-CM | POA: Diagnosis not present

## 2023-11-28 DIAGNOSIS — R296 Repeated falls: Secondary | ICD-10-CM | POA: Diagnosis not present

## 2023-11-28 DIAGNOSIS — M62512 Muscle wasting and atrophy, not elsewhere classified, left shoulder: Secondary | ICD-10-CM | POA: Diagnosis not present

## 2023-11-29 DIAGNOSIS — R262 Difficulty in walking, not elsewhere classified: Secondary | ICD-10-CM | POA: Diagnosis not present

## 2023-11-29 DIAGNOSIS — F2089 Other schizophrenia: Secondary | ICD-10-CM | POA: Diagnosis not present

## 2023-11-29 DIAGNOSIS — R278 Other lack of coordination: Secondary | ICD-10-CM | POA: Diagnosis not present

## 2023-11-29 DIAGNOSIS — R41841 Cognitive communication deficit: Secondary | ICD-10-CM | POA: Diagnosis not present

## 2023-11-30 DIAGNOSIS — M62512 Muscle wasting and atrophy, not elsewhere classified, left shoulder: Secondary | ICD-10-CM | POA: Diagnosis not present

## 2023-11-30 DIAGNOSIS — R2681 Unsteadiness on feet: Secondary | ICD-10-CM | POA: Diagnosis not present

## 2023-11-30 DIAGNOSIS — R262 Difficulty in walking, not elsewhere classified: Secondary | ICD-10-CM | POA: Diagnosis not present

## 2023-11-30 DIAGNOSIS — R296 Repeated falls: Secondary | ICD-10-CM | POA: Diagnosis not present

## 2023-11-30 DIAGNOSIS — R278 Other lack of coordination: Secondary | ICD-10-CM | POA: Diagnosis not present

## 2023-12-03 DIAGNOSIS — R2681 Unsteadiness on feet: Secondary | ICD-10-CM | POA: Diagnosis not present

## 2023-12-03 DIAGNOSIS — R296 Repeated falls: Secondary | ICD-10-CM | POA: Diagnosis not present

## 2023-12-03 DIAGNOSIS — R262 Difficulty in walking, not elsewhere classified: Secondary | ICD-10-CM | POA: Diagnosis not present

## 2023-12-03 DIAGNOSIS — R278 Other lack of coordination: Secondary | ICD-10-CM | POA: Diagnosis not present

## 2023-12-03 DIAGNOSIS — M62512 Muscle wasting and atrophy, not elsewhere classified, left shoulder: Secondary | ICD-10-CM | POA: Diagnosis not present

## 2023-12-04 DIAGNOSIS — R41841 Cognitive communication deficit: Secondary | ICD-10-CM | POA: Diagnosis not present

## 2023-12-04 DIAGNOSIS — F2089 Other schizophrenia: Secondary | ICD-10-CM | POA: Diagnosis not present

## 2023-12-05 DIAGNOSIS — R296 Repeated falls: Secondary | ICD-10-CM | POA: Diagnosis not present

## 2023-12-05 DIAGNOSIS — F2089 Other schizophrenia: Secondary | ICD-10-CM | POA: Diagnosis not present

## 2023-12-05 DIAGNOSIS — M62512 Muscle wasting and atrophy, not elsewhere classified, left shoulder: Secondary | ICD-10-CM | POA: Diagnosis not present

## 2023-12-05 DIAGNOSIS — R2681 Unsteadiness on feet: Secondary | ICD-10-CM | POA: Diagnosis not present

## 2023-12-05 DIAGNOSIS — R41841 Cognitive communication deficit: Secondary | ICD-10-CM | POA: Diagnosis not present

## 2023-12-06 DIAGNOSIS — E7800 Pure hypercholesterolemia, unspecified: Secondary | ICD-10-CM | POA: Diagnosis not present

## 2023-12-06 DIAGNOSIS — R41841 Cognitive communication deficit: Secondary | ICD-10-CM | POA: Diagnosis not present

## 2023-12-06 DIAGNOSIS — F2089 Other schizophrenia: Secondary | ICD-10-CM | POA: Diagnosis not present

## 2023-12-06 DIAGNOSIS — E039 Hypothyroidism, unspecified: Secondary | ICD-10-CM | POA: Diagnosis not present

## 2023-12-06 DIAGNOSIS — F321 Major depressive disorder, single episode, moderate: Secondary | ICD-10-CM | POA: Diagnosis not present

## 2023-12-06 DIAGNOSIS — R2681 Unsteadiness on feet: Secondary | ICD-10-CM | POA: Diagnosis not present

## 2023-12-06 DIAGNOSIS — I480 Paroxysmal atrial fibrillation: Secondary | ICD-10-CM | POA: Diagnosis not present

## 2023-12-06 DIAGNOSIS — R296 Repeated falls: Secondary | ICD-10-CM | POA: Diagnosis not present

## 2023-12-06 DIAGNOSIS — M62512 Muscle wasting and atrophy, not elsewhere classified, left shoulder: Secondary | ICD-10-CM | POA: Diagnosis not present

## 2023-12-10 DIAGNOSIS — R41841 Cognitive communication deficit: Secondary | ICD-10-CM | POA: Diagnosis not present

## 2023-12-10 DIAGNOSIS — R278 Other lack of coordination: Secondary | ICD-10-CM | POA: Diagnosis not present

## 2023-12-10 DIAGNOSIS — F2089 Other schizophrenia: Secondary | ICD-10-CM | POA: Diagnosis not present

## 2023-12-10 DIAGNOSIS — R262 Difficulty in walking, not elsewhere classified: Secondary | ICD-10-CM | POA: Diagnosis not present

## 2023-12-11 DIAGNOSIS — F2089 Other schizophrenia: Secondary | ICD-10-CM | POA: Diagnosis not present

## 2023-12-11 DIAGNOSIS — M62512 Muscle wasting and atrophy, not elsewhere classified, left shoulder: Secondary | ICD-10-CM | POA: Diagnosis not present

## 2023-12-11 DIAGNOSIS — R41841 Cognitive communication deficit: Secondary | ICD-10-CM | POA: Diagnosis not present

## 2023-12-11 DIAGNOSIS — R278 Other lack of coordination: Secondary | ICD-10-CM | POA: Diagnosis not present

## 2023-12-11 DIAGNOSIS — R262 Difficulty in walking, not elsewhere classified: Secondary | ICD-10-CM | POA: Diagnosis not present

## 2023-12-13 DIAGNOSIS — R41841 Cognitive communication deficit: Secondary | ICD-10-CM | POA: Diagnosis not present

## 2023-12-13 DIAGNOSIS — M62512 Muscle wasting and atrophy, not elsewhere classified, left shoulder: Secondary | ICD-10-CM | POA: Diagnosis not present

## 2023-12-13 DIAGNOSIS — R262 Difficulty in walking, not elsewhere classified: Secondary | ICD-10-CM | POA: Diagnosis not present

## 2023-12-13 DIAGNOSIS — F2089 Other schizophrenia: Secondary | ICD-10-CM | POA: Diagnosis not present

## 2023-12-14 DIAGNOSIS — R262 Difficulty in walking, not elsewhere classified: Secondary | ICD-10-CM | POA: Diagnosis not present

## 2023-12-14 DIAGNOSIS — M62512 Muscle wasting and atrophy, not elsewhere classified, left shoulder: Secondary | ICD-10-CM | POA: Diagnosis not present

## 2023-12-14 DIAGNOSIS — R278 Other lack of coordination: Secondary | ICD-10-CM | POA: Diagnosis not present

## 2023-12-17 DIAGNOSIS — R262 Difficulty in walking, not elsewhere classified: Secondary | ICD-10-CM | POA: Diagnosis not present

## 2023-12-17 DIAGNOSIS — R278 Other lack of coordination: Secondary | ICD-10-CM | POA: Diagnosis not present

## 2023-12-19 DIAGNOSIS — R262 Difficulty in walking, not elsewhere classified: Secondary | ICD-10-CM | POA: Diagnosis not present

## 2023-12-19 DIAGNOSIS — R278 Other lack of coordination: Secondary | ICD-10-CM | POA: Diagnosis not present

## 2023-12-21 DIAGNOSIS — R262 Difficulty in walking, not elsewhere classified: Secondary | ICD-10-CM | POA: Diagnosis not present

## 2023-12-21 DIAGNOSIS — R278 Other lack of coordination: Secondary | ICD-10-CM | POA: Diagnosis not present

## 2023-12-25 DIAGNOSIS — R278 Other lack of coordination: Secondary | ICD-10-CM | POA: Diagnosis not present

## 2023-12-25 DIAGNOSIS — R262 Difficulty in walking, not elsewhere classified: Secondary | ICD-10-CM | POA: Diagnosis not present

## 2023-12-27 DIAGNOSIS — M79675 Pain in left toe(s): Secondary | ICD-10-CM | POA: Diagnosis not present

## 2023-12-27 DIAGNOSIS — E119 Type 2 diabetes mellitus without complications: Secondary | ICD-10-CM | POA: Diagnosis not present

## 2023-12-28 DIAGNOSIS — R262 Difficulty in walking, not elsewhere classified: Secondary | ICD-10-CM | POA: Diagnosis not present

## 2023-12-28 DIAGNOSIS — R278 Other lack of coordination: Secondary | ICD-10-CM | POA: Diagnosis not present

## 2023-12-31 DIAGNOSIS — R278 Other lack of coordination: Secondary | ICD-10-CM | POA: Diagnosis not present

## 2023-12-31 DIAGNOSIS — R262 Difficulty in walking, not elsewhere classified: Secondary | ICD-10-CM | POA: Diagnosis not present

## 2024-01-01 DIAGNOSIS — E7800 Pure hypercholesterolemia, unspecified: Secondary | ICD-10-CM | POA: Diagnosis not present

## 2024-01-01 DIAGNOSIS — F321 Major depressive disorder, single episode, moderate: Secondary | ICD-10-CM | POA: Diagnosis not present

## 2024-01-01 DIAGNOSIS — E039 Hypothyroidism, unspecified: Secondary | ICD-10-CM | POA: Diagnosis not present

## 2024-01-01 DIAGNOSIS — E2839 Other primary ovarian failure: Secondary | ICD-10-CM | POA: Diagnosis not present

## 2024-01-01 DIAGNOSIS — E119 Type 2 diabetes mellitus without complications: Secondary | ICD-10-CM | POA: Diagnosis not present

## 2024-01-01 DIAGNOSIS — I700 Atherosclerosis of aorta: Secondary | ICD-10-CM | POA: Diagnosis not present

## 2024-01-01 DIAGNOSIS — Z23 Encounter for immunization: Secondary | ICD-10-CM | POA: Diagnosis not present

## 2024-01-01 DIAGNOSIS — R2681 Unsteadiness on feet: Secondary | ICD-10-CM | POA: Diagnosis not present

## 2024-01-01 DIAGNOSIS — Z0000 Encounter for general adult medical examination without abnormal findings: Secondary | ICD-10-CM | POA: Diagnosis not present

## 2024-01-02 DIAGNOSIS — R262 Difficulty in walking, not elsewhere classified: Secondary | ICD-10-CM | POA: Diagnosis not present

## 2024-01-02 DIAGNOSIS — R278 Other lack of coordination: Secondary | ICD-10-CM | POA: Diagnosis not present

## 2024-01-17 DIAGNOSIS — R262 Difficulty in walking, not elsewhere classified: Secondary | ICD-10-CM | POA: Diagnosis not present

## 2024-01-17 DIAGNOSIS — R278 Other lack of coordination: Secondary | ICD-10-CM | POA: Diagnosis not present

## 2024-01-21 DIAGNOSIS — R262 Difficulty in walking, not elsewhere classified: Secondary | ICD-10-CM | POA: Diagnosis not present

## 2024-01-21 DIAGNOSIS — R278 Other lack of coordination: Secondary | ICD-10-CM | POA: Diagnosis not present

## 2024-01-25 DIAGNOSIS — R262 Difficulty in walking, not elsewhere classified: Secondary | ICD-10-CM | POA: Diagnosis not present

## 2024-01-25 DIAGNOSIS — R278 Other lack of coordination: Secondary | ICD-10-CM | POA: Diagnosis not present

## 2024-03-18 DIAGNOSIS — F3342 Major depressive disorder, recurrent, in full remission: Secondary | ICD-10-CM | POA: Diagnosis not present

## 2024-03-18 DIAGNOSIS — F411 Generalized anxiety disorder: Secondary | ICD-10-CM | POA: Diagnosis not present

## 2024-03-24 ENCOUNTER — Other Ambulatory Visit: Payer: Self-pay | Admitting: Interventional Cardiology

## 2024-03-24 ENCOUNTER — Other Ambulatory Visit: Payer: Self-pay | Admitting: Cardiology

## 2024-03-24 DIAGNOSIS — I480 Paroxysmal atrial fibrillation: Secondary | ICD-10-CM

## 2024-03-24 DIAGNOSIS — Z79899 Other long term (current) drug therapy: Secondary | ICD-10-CM

## 2024-03-24 NOTE — Telephone Encounter (Signed)
  Pharmacy called and requesting to get 30 day supply since patient made an appt on 11/25 at 3 pm

## 2024-03-24 NOTE — Telephone Encounter (Signed)
 Patient keep the appointment for more refills.

## 2024-03-24 NOTE — Telephone Encounter (Signed)
*  STAT* If patient is at the pharmacy, call can be transferred to refill team.   1. Which medications need to be refilled? (please list name of each medication and dose if known) propranolol  (INDERAL ) 20 MG tablet   digoxin  (LANOXIN ) 0.25 MG tablet    2. Would you like to learn more about the convenience, safety, & potential cost savings by using the [facility]? No    3. Are you open to using the [facility] (Type [facility]. No    4. Which pharmacy/location (including street and city if local pharmacy) is medication to be sent to?[facility] - [place], [place] - [address]    5. Do they need a 30 day or 90 day supply? 90 day   Pt has appt schedule 12/18. Pt is out of medication.

## 2024-03-31 ENCOUNTER — Other Ambulatory Visit: Payer: Self-pay | Admitting: Interventional Cardiology

## 2024-03-31 NOTE — Telephone Encounter (Signed)
 Spoke to patient  she is aware she need to have labs completed prior to medication being refill.  She will come  tomorrow  - it will one trip . She has an appt with Dr Kriste  04/01/24.  Lab has been placed in the  [facility] BMP  Digoxin  level Magnesium 

## 2024-03-31 NOTE — Telephone Encounter (Signed)
 Please have the patient get a BMP, magnesium  and digoxin  level prior to refill.   Thank you, Emeline Calender, DO

## 2024-04-01 ENCOUNTER — Encounter: Payer: Self-pay | Admitting: Internal Medicine

## 2024-04-01 ENCOUNTER — Ambulatory Visit: Attending: Internal Medicine | Admitting: Internal Medicine

## 2024-04-01 ENCOUNTER — Ambulatory Visit

## 2024-04-01 VITALS — BP 120/58 | HR 67 | Ht 67.0 in | Wt 169.1 lb

## 2024-04-01 DIAGNOSIS — E118 Type 2 diabetes mellitus with unspecified complications: Secondary | ICD-10-CM | POA: Diagnosis not present

## 2024-04-01 DIAGNOSIS — I480 Paroxysmal atrial fibrillation: Secondary | ICD-10-CM

## 2024-04-01 DIAGNOSIS — R002 Palpitations: Secondary | ICD-10-CM

## 2024-04-01 DIAGNOSIS — E039 Hypothyroidism, unspecified: Secondary | ICD-10-CM | POA: Diagnosis not present

## 2024-04-01 DIAGNOSIS — F209 Schizophrenia, unspecified: Secondary | ICD-10-CM | POA: Diagnosis not present

## 2024-04-01 DIAGNOSIS — Z79899 Other long term (current) drug therapy: Secondary | ICD-10-CM

## 2024-04-01 DIAGNOSIS — I349 Nonrheumatic mitral valve disorder, unspecified: Secondary | ICD-10-CM

## 2024-04-01 DIAGNOSIS — Z87440 Personal history of urinary (tract) infections: Secondary | ICD-10-CM | POA: Diagnosis not present

## 2024-04-01 DIAGNOSIS — R600 Localized edema: Secondary | ICD-10-CM | POA: Diagnosis not present

## 2024-04-01 DIAGNOSIS — E782 Mixed hyperlipidemia: Secondary | ICD-10-CM | POA: Diagnosis not present

## 2024-04-01 DIAGNOSIS — I059 Rheumatic mitral valve disease, unspecified: Secondary | ICD-10-CM

## 2024-04-01 DIAGNOSIS — I4891 Unspecified atrial fibrillation: Secondary | ICD-10-CM | POA: Diagnosis not present

## 2024-04-01 DIAGNOSIS — Z87442 Personal history of urinary calculi: Secondary | ICD-10-CM | POA: Diagnosis not present

## 2024-04-01 MED ORDER — DIGOXIN 125 MCG PO TABS: ORAL_TABLET | ORAL | Status: AC

## 2024-04-01 MED ORDER — PROPRANOLOL HCL ER 80 MG PO CP24: 80.0000 mg | ORAL_CAPSULE | Freq: Every day | ORAL | 3 refills | Status: AC

## 2024-04-01 NOTE — Progress Notes (Signed)
 Cardiology Office Note   Date:  04/01/2024  ID:  Deanna Shea, DOB 01/19/1946, MRN [id number] PCP: Regino Slater, MD  [facility] Providers Cardiologist:  Emeline FORBES Calender, DO     History of Present Illness Deanna Shea is a 78 y.o. adult who was previously seen by Dr. Dann with a history of mitral valve prolapse and palpitations possibly due to atrial fibrillation, ruptured appendix in 2015, diabetes who lives in an assisted living facility and presents today for follow-up.  She has been on propranolol  and digoxin  since she is 78 years old for suspected atrial fibrillation.  However, after further review there is no record of documented atrial fibrillation.  She recently ran out of propranolol  and has had an increase in palpitations lately but has been doing well on digoxin  0.25 every other day.  Otherwise no complaints.    ROS:  Review of Systems  All other systems reviewed and are negative.   Physical Exam  Physical Exam Vitals and nursing note reviewed.  Constitutional:      Appearance: Normal appearance.  HENT:     Head: Normocephalic and atraumatic.  Eyes:     Conjunctiva/sclera: Conjunctivae normal.  Cardiovascular:     Rate and Rhythm: Normal rate and regular rhythm.  Pulmonary:     Effort: Pulmonary effort is normal.     Breath sounds: Normal breath sounds.  Musculoskeletal:        General: No swelling or tenderness.  Skin:    Coloration: Skin is not jaundiced or pale.  Neurological:     Mental Status: She is alert.     VS:  BP (!) 120/58 (BP Location: Left Arm, Patient Position: Sitting, Cuff Size: Normal)   Pulse 67   Ht 5' 7 (1.702 m)   Wt 169 lb 1.6 oz (76.7 kg)   SpO2 94%   BMI 26.48 kg/m         Wt Readings from Last 3 Encounters:  04/01/24 169 lb 1.6 oz (76.7 kg)  11/08/23 160 lb (72.6 kg)  07/26/23 154 lb 3.2 oz (69.9 kg)     EKG Interpretation Date/Time:  Tuesday April 01 2024 15:03:22 EST Ventricular Rate:  67 PR  Interval:  178 QRS Duration:  82 QT Interval:  412 QTC Calculation: 435 R Axis:   59  Text Interpretation: Normal sinus rhythm Normal ECG When compared with ECG of 28-May-2023 18:21, QT has shortened Confirmed by Calender Emeline [phone]) on 04/01/2024 3:07:35 PM    Studies Reviewed   Echocardiogram 08/01/2021:   1. Left ventricular ejection fraction, by estimation, is 60 to 65%. The  left ventricle has normal function. The left ventricle has no regional  wall motion abnormalities. There is mild concentric left ventricular  hypertrophy. Left ventricular diastolic  parameters were normal.   2. Right ventricular systolic function is normal. The right ventricular  size is normal.   3. The mitral valve is normal in structure. There is mild bileaflet  proplapse. Mild mitral valve regurgitation. No evidence of mitral  stenosis.   4. The aortic valve is tricuspid. Aortic valve regurgitation is not  visualized. No aortic stenosis is present.   5. The inferior vena cava is normal in size with greater than 50%  respiratory variability, suggesting right atrial pressure of 3 mmHg.   Comparison(s): Compared to prior TTE report in 2010, there is no  significant change.      Risk Assessment/Calculations  ASCVD risk score: The ASCVD Risk score (Arnett DK, et al., 2019) failed to calculate for the following reasons:   Cannot find a previous HDL lab   Cannot find a previous total cholesterol lab   ASSESSMENT  Palpitations with a reported history of atrial fibrillation however no documented abnormal rhythm patient reports she has been on digoxin  0.25 and propranolol  20 mg 4 times daily since she is 78 years old for suspected atrial fibrillation.  All EKGs on my personal review have shown normal sinus rhythm and there is no rhythm strip documenting atrial fibrillation.  Therefore she likely does not have atrial fibrillation.  She is also not on stroke prophylaxis.  We had an  extensive discussion with the patient's daughter-in-law at bedside.   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  She is already taking every other day dosing for nearly 50 years.  Will decrease by half to digoxin  0.125 every other day x 1 week then stop She is getting lab work today including BMP, magnesium  and digoxin  level Will change propranolol  20 mg 4 times daily to long-acting 80 mg daily to decrease pill burden Will check a 2-week event monitor to evaluate for arrhythmia especially while weaning off of digoxin  Echocardiogram to follow-up on mitral valve prolapse  Follow up: 4 to 6 weeks with myself          Signed, Emeline FORBES Calender, DO

## 2024-04-01 NOTE — Progress Notes (Unsigned)
 Enrolled for [employer] to mail a ZIO XT long term holter monitor to the patients address on file.

## 2024-04-01 NOTE — Patient Instructions (Addendum)
 Medication Instructions:      CHANGE TO propranolol  80 MG DAILY      WEAN OFF DIGOXIN   .125 MG ( 1/2 TABLET ) EVERY OTHER DAY FOR ONE WEEK THEN STOP  *If you need a refill on your cardiac medications before your next appointment, please call your pharmacy*   Lab Work:  CONTINUE TO HAVE LABS TODAY  If you have labs (blood work) drawn today and your tests are completely normal, you will receive your results only by: MyChart Message (if you have MyChart) OR A paper copy in the mail If you have any lab test that is abnormal or we need to change your treatment, we will call you to review the results.   Testing/Procedures:  Your physician has recommended that you wear a holter monitor. 14 DAY ZIO  Holter monitors are medical devices that record the heart's electrical activity. Doctors most often use these monitors to diagnose arrhythmias. Arrhythmias are problems with the speed or rhythm of the heartbeat. The monitor is a small, portable device. You can wear one while you do your normal daily activities. This is usually used to diagnose what is causing palpitations/syncope (passing out).     2) Your physician has requested that you have an echocardiogram. Echocardiography is a painless test that uses sound waves to create images of your heart. It provides your doctor with information about the size and shape of your heart and how well your heart's chambers and valves are working. This procedure takes approximately one hour. There are no restrictions for this procedure. Please do NOT wear cologne, perfume, aftershave, or lotions (deodorant is allowed). Please arrive 15 minutes prior to your appointment time.  Please note: We ask at that you not bring children with you during ultrasound (echo/ vascular) testing. Due to room size and safety concerns, children are not allowed in the ultrasound rooms during exams. Our front office staff cannot provide observation of children in our lobby area  while testing is being conducted. An adult accompanying a patient to their appointment will only be allowed in the ultrasound room at the discretion of the ultrasound technician under special circumstances. We apologize for any inconvenience.  Follow-Up: At [facility], you and your health needs are our priority.  As part of our continuing mission to provide you with exceptional heart care, we have created designated Provider Care Teams.  These Care Teams include your primary Cardiologist (physician) and Advanced Practice Providers (APPs -  Physician Assistants and Nurse Practitioners) who all work together to provide you with the care you need, when you need it.     Your next appointment:   4 TO 6 week(s)  The format for your next appointment:   In Person  Provider:   Emeline FORBES Calender, DO   Other Instructions ZIO XT- Long Term Monitor Instructions  Your physician has requested you wear a ZIO patch monitor for 14 days.  This is a single patch monitor. [employer] supplies one patch monitor per enrollment. Additional stickers are not available. Please do not apply patch if you will be having a Nuclear Stress Test,  Echocardiogram, Cardiac CT, MRI, or Chest Xray during the period you would be wearing the  monitor. The patch cannot be worn during these tests. You cannot remove and re-apply the  ZIO XT patch monitor.  Your ZIO patch monitor will be mailed 3 day USPS to your address on file. It may take 3-5 days  to receive your monitor after you have  been enrolled.  Once you have received your monitor, please review the enclosed instructions. Your monitor  has already been registered assigning a specific monitor serial # to you.  Billing and Patient Assistance Program Information  We have supplied [employer] with any of your insurance information on file for billing purposes. [employer] offers a sliding scale Patient Assistance Program for patients that do not have  insurance, or whose insurance  does not completely cover the cost of the ZIO monitor.  You must apply for the Patient Assistance Program to qualify for this discounted rate.  To apply, please call [employer] at [phone], select option 4, select option 2, ask to apply for  Patient Assistance Program. Meredeth will ask your household income, and how many people  are in your household. They will quote your out-of-pocket cost based on that information.  [employer] will also be able to set up a 62-month, interest-free payment plan if needed.  Applying the monitor   Shave hair from upper left chest.  Hold abrader disc by orange tab. Rub abrader in 40 strokes over the upper left chest as  indicated in your monitor instructions.  Clean area with 4 enclosed alcohol pads. Let dry.  Apply patch as indicated in monitor instructions. Patch will be placed under collarbone on left  side of chest with arrow pointing upward.  Rub patch adhesive wings for 2 minutes. Remove white label marked 1. Remove the white  label marked 2. Rub patch adhesive wings for 2 additional minutes.  While looking in a mirror, press and release button in center of patch. A small green light will  flash 3-4 times. This will be your only indicator that the monitor has been turned on.  Do not shower for the first 24 hours. You may shower after the first 24 hours.  Press the button if you feel a symptom. You will hear a small click. Record Date, Time and  Symptom in the Patient Logbook.  When you are ready to remove the patch, follow instructions on the last 2 pages of Patient  Logbook. Stick patch monitor onto the last page of Patient Logbook.  Place Patient Logbook in the blue and white box. Use locking tab on box and tape box closed  securely. The blue and white box has prepaid postage on it. Please place it in the mailbox as  soon as possible. Your physician should have your test results approximately 7 days after the  monitor has been mailed back to  [facility].  Call [facility] Customer Care at [phone] if you have questions regarding  your ZIO XT patch monitor. Call them immediately if you see an orange light blinking on your  monitor.  If your monitor falls off in less than 4 days, contact our Monitor department at [phone].  If your monitor becomes loose or falls off after 4 days call [employer] at [phone] for  suggestions on securing your monitor

## 2024-04-02 ENCOUNTER — Ambulatory Visit: Payer: Self-pay | Admitting: Internal Medicine

## 2024-04-02 DIAGNOSIS — D519 Vitamin B12 deficiency anemia, unspecified: Secondary | ICD-10-CM | POA: Diagnosis not present

## 2024-04-02 DIAGNOSIS — E559 Vitamin D deficiency, unspecified: Secondary | ICD-10-CM | POA: Diagnosis not present

## 2024-04-02 DIAGNOSIS — I10 Essential (primary) hypertension: Secondary | ICD-10-CM | POA: Diagnosis not present

## 2024-04-02 DIAGNOSIS — E782 Mixed hyperlipidemia: Secondary | ICD-10-CM | POA: Diagnosis not present

## 2024-04-02 LAB — BASIC METABOLIC PANEL WITH GFR
BUN/Creatinine Ratio: 19 (ref 12–28)
BUN: 22 mg/dL (ref 8–27)
CO2: 24 mmol/L (ref 20–29)
Calcium: 10.5 mg/dL — ABNORMAL HIGH (ref 8.7–10.3)
Chloride: 98 mmol/L (ref 96–106)
Creatinine, Ser: 1.13 mg/dL — ABNORMAL HIGH (ref 0.57–1.00)
Glucose: 138 mg/dL — ABNORMAL HIGH (ref 70–99)
Potassium: 4.5 mmol/L (ref 3.5–5.2)
Sodium: 141 mmol/L (ref 134–144)
eGFR: 50 mL/min/1.73 — ABNORMAL LOW (ref >59)

## 2024-04-02 LAB — DIGOXIN LEVEL: Digoxin, Serum: <0.4 ng/mL — ABNORMAL LOW (ref 0.5–0.9)

## 2024-04-02 LAB — MAGNESIUM: Magnesium: 1.9 mg/dL (ref 1.6–2.3)

## 2024-04-02 NOTE — Telephone Encounter (Signed)
 I called the patient and let her know of her lab results. Per Dr. Kriste, Patient's creatinine is slightly above baseline and digoxin  level is low. Given these findings and the fact that we were planning on titrating off of digoxin , I would recommend simply discontinuing digoxin  altogether. The patient demonstrated understanding and said she will reach out if needed.

## 2024-04-24 ENCOUNTER — Ambulatory Visit: Admitting: Cardiology

## 2024-05-12 ENCOUNTER — Ambulatory Visit (HOSPITAL_COMMUNITY): Admission: RE | Admit: 2024-05-12 | Source: Ambulatory Visit

## 2024-05-13 ENCOUNTER — Encounter (HOSPITAL_COMMUNITY): Payer: Self-pay | Admitting: Internal Medicine

## 2024-05-23 ENCOUNTER — Encounter: Payer: Self-pay | Admitting: *Deleted

## 2024-05-25 NOTE — Progress Notes (Unsigned)
" °  Cardiology Office Note:  .   Date:  05/25/2024  ID:  Deanna Shea, DOB Mar 28, 1946, MRN [id number] PCP: Regino Slater, MD  [facility] Providers Cardiologist:  Emeline FORBES Calender, DO { Click to update primary MD,subspecialty MD or APP then REFRESH:1}   History of Present Illness: .    Deanna Shea is a 79 y.o. adult ***  Discussed the use of AI scribe software for clinical note transcription with the patient, who gave verbal consent to proceed.  History of Present Illness       ROS: Remaining review of systems negative  Studies Reviewed: .        Results  Risk Assessment/Calculations:   {Does this patient have ATRIAL FIBRILLATION?:[phone]} No BP recorded.  {Refresh Note OR Click here to enter BP  :1}***       Physical Exam:   VS:  There were no vitals taken for this visit.   Wt Readings from Last 3 Encounters:  04/01/24 169 lb 1.6 oz (76.7 kg)  11/08/23 160 lb (72.6 kg)  07/26/23 154 lb 3.2 oz (69.9 kg)    GEN: Well nourished, well developed in no acute distress NECK: No JVD; No carotid bruits CARDIAC: ***RRR, no murmurs, no rubs, no gallops RESPIRATORY:  Clear to auscultation without rales, wheezing or rhonchi  ABDOMEN: Soft, non-tender, non-distended EXTREMITIES:  No edema; No deformity   ASSESSMENT AND PLAN: .    Assessment and Plan Assessment & Plan        {Are you ordering a CV Procedure (e.g. stress test, cath, DCCV, TEE, etc)?   Press F2        :[id number]}    Follow up: ***  Signed, Emeline FORBES Calender, DO  05/25/2024 2:05 PM    [facility] "

## 2024-05-26 ENCOUNTER — Ambulatory Visit: Admitting: Internal Medicine
